# Patient Record
Sex: Male | Born: 1935 | Race: White | Hispanic: No | Marital: Married | Smoking: Former smoker
Health system: Southern US, Community
[De-identification: ages and names within clinical notes are randomized; demographics above are authoritative.]

## PROBLEM LIST (undated history)

## (undated) DIAGNOSIS — I10 Essential (primary) hypertension: Secondary | ICD-10-CM

## (undated) DIAGNOSIS — N138 Other obstructive and reflux uropathy: Secondary | ICD-10-CM

## (undated) DIAGNOSIS — H9190 Unspecified hearing loss, unspecified ear: Secondary | ICD-10-CM

## (undated) DIAGNOSIS — M8580 Other specified disorders of bone density and structure, unspecified site: Secondary | ICD-10-CM

## (undated) DIAGNOSIS — Z87442 Personal history of urinary calculi: Secondary | ICD-10-CM

## (undated) DIAGNOSIS — N401 Enlarged prostate with lower urinary tract symptoms: Secondary | ICD-10-CM

## (undated) DIAGNOSIS — E785 Hyperlipidemia, unspecified: Secondary | ICD-10-CM

## (undated) DIAGNOSIS — K219 Gastro-esophageal reflux disease without esophagitis: Secondary | ICD-10-CM

## (undated) DIAGNOSIS — R7302 Impaired glucose tolerance (oral): Secondary | ICD-10-CM

## (undated) DIAGNOSIS — B0230 Zoster ocular disease, unspecified: Secondary | ICD-10-CM

## (undated) DIAGNOSIS — IMO0001 Reserved for inherently not codable concepts without codable children: Secondary | ICD-10-CM

## (undated) HISTORY — PX: OTHER SURGICAL HISTORY: SHX169

## (undated) HISTORY — PX: INGUINAL HERNIA REPAIR: SUR1180

## (undated) HISTORY — DX: Hyperlipidemia, unspecified: E78.5

## (undated) HISTORY — DX: Other specified disorders of bone density and structure, unspecified site: M85.80

## (undated) HISTORY — DX: Impaired glucose tolerance (oral): R73.02

## (undated) HISTORY — DX: Personal history of urinary calculi: Z87.442

## (undated) HISTORY — DX: Gastro-esophageal reflux disease without esophagitis: K21.9

## (undated) HISTORY — DX: Essential (primary) hypertension: I10

## (undated) HISTORY — PX: CATARACT EXTRACTION: SUR2

## (undated) HISTORY — DX: Other obstructive and reflux uropathy: N13.8

## (undated) HISTORY — DX: Reserved for inherently not codable concepts without codable children: IMO0001

## (undated) HISTORY — DX: Benign prostatic hyperplasia with lower urinary tract symptoms: N40.1

## (undated) HISTORY — DX: Unspecified hearing loss, unspecified ear: H91.90

## (undated) HISTORY — DX: Zoster ocular disease, unspecified: B02.30

---

## 1995-02-23 HISTORY — PX: APPENDECTOMY: SHX54

## 1998-02-22 DIAGNOSIS — N138 Other obstructive and reflux uropathy: Secondary | ICD-10-CM

## 1998-02-22 HISTORY — DX: Benign prostatic hyperplasia with lower urinary tract symptoms: N13.8

## 1998-11-20 ENCOUNTER — Other Ambulatory Visit: Admission: RE | Admit: 1998-11-20 | Discharge: 1998-11-20 | Payer: Self-pay | Admitting: Urology

## 2000-02-23 HISTORY — PX: COLONOSCOPY: SHX174

## 2001-12-19 ENCOUNTER — Ambulatory Visit: Admission: RE | Admit: 2001-12-19 | Discharge: 2001-12-19 | Payer: Self-pay | Admitting: Family Medicine

## 2003-02-23 HISTORY — PX: LITHOTRIPSY: SUR834

## 2003-10-17 ENCOUNTER — Ambulatory Visit (HOSPITAL_COMMUNITY): Admission: RE | Admit: 2003-10-17 | Discharge: 2003-10-17 | Payer: Self-pay | Admitting: Urology

## 2005-08-10 ENCOUNTER — Encounter: Payer: Self-pay | Admitting: Family Medicine

## 2005-08-13 ENCOUNTER — Encounter: Admission: RE | Admit: 2005-08-13 | Discharge: 2005-08-13 | Payer: Self-pay | Admitting: Family Medicine

## 2006-02-13 ENCOUNTER — Emergency Department (HOSPITAL_COMMUNITY): Admission: EM | Admit: 2006-02-13 | Discharge: 2006-02-14 | Payer: Self-pay | Admitting: Emergency Medicine

## 2006-03-02 ENCOUNTER — Ambulatory Visit: Payer: Self-pay | Admitting: Gastroenterology

## 2006-03-14 ENCOUNTER — Encounter: Admission: RE | Admit: 2006-03-14 | Discharge: 2006-03-14 | Payer: Self-pay | Admitting: Family Medicine

## 2007-03-21 ENCOUNTER — Inpatient Hospital Stay (HOSPITAL_COMMUNITY): Admission: EM | Admit: 2007-03-21 | Discharge: 2007-03-22 | Payer: Self-pay | Admitting: Emergency Medicine

## 2007-05-02 ENCOUNTER — Ambulatory Visit: Payer: Self-pay | Admitting: Gastroenterology

## 2007-05-12 ENCOUNTER — Ambulatory Visit (HOSPITAL_COMMUNITY): Admission: RE | Admit: 2007-05-12 | Discharge: 2007-05-12 | Payer: Self-pay | Admitting: Gastroenterology

## 2008-08-13 ENCOUNTER — Encounter: Payer: Self-pay | Admitting: Family Medicine

## 2008-08-13 LAB — CONVERTED CEMR LAB: Vit D, 25-Hydroxy: 39.4 ng/mL

## 2009-10-28 ENCOUNTER — Encounter: Payer: Self-pay | Admitting: Family Medicine

## 2009-10-28 LAB — CONVERTED CEMR LAB
ALT: 15 units/L
Alkaline Phosphatase: 71 units/L
CO2: 23 meq/L
Cholesterol: 130 mg/dL
Creatinine, Ser: 0.95 mg/dL
Glucose, Bld: 104 mg/dL
Hgb A1c MFr Bld: 5.6 %
LDL Cholesterol: 63 mg/dL
Sodium: 144 meq/L
Total Bilirubin: 0.5 mg/dL
Triglycerides: 48 mg/dL

## 2010-03-31 ENCOUNTER — Encounter: Payer: Self-pay | Admitting: Family Medicine

## 2010-03-31 ENCOUNTER — Encounter (INDEPENDENT_AMBULATORY_CARE_PROVIDER_SITE_OTHER): Payer: Medicare Other | Admitting: Family Medicine

## 2010-03-31 DIAGNOSIS — Z8719 Personal history of other diseases of the digestive system: Secondary | ICD-10-CM | POA: Insufficient documentation

## 2010-03-31 DIAGNOSIS — E785 Hyperlipidemia, unspecified: Secondary | ICD-10-CM | POA: Insufficient documentation

## 2010-03-31 DIAGNOSIS — I10 Essential (primary) hypertension: Secondary | ICD-10-CM | POA: Insufficient documentation

## 2010-03-31 DIAGNOSIS — H409 Unspecified glaucoma: Secondary | ICD-10-CM

## 2010-03-31 DIAGNOSIS — Z87442 Personal history of urinary calculi: Secondary | ICD-10-CM

## 2010-03-31 DIAGNOSIS — J309 Allergic rhinitis, unspecified: Secondary | ICD-10-CM | POA: Insufficient documentation

## 2010-04-05 ENCOUNTER — Encounter: Payer: Self-pay | Admitting: Family Medicine

## 2010-04-09 NOTE — Assessment & Plan Note (Signed)
Summary: new pt to est/cle   Vital Signs:  Patient profile:   77 year old male Height:      68 inches Weight:      163.25 pounds BMI:     24.91 Temp:     98.1 degrees F oral Pulse rate:   74 / minute Pulse rhythm:   regular BP sitting:   136 / 80  (left arm) Cuff size:   regular  Vitals Entered By: Selena Batten Dance CMA Duncan Dull) (March 31, 2010 2:08 PM) CC: New patient to establish care   History of Present Illness: CC: new patient, establish  previously saw Dr Raquel James.  1. HTN - well contorlled on current meds. no HA, vision changes, chest pain, tightness, urinary changes, LE swelling.    2. HLD - thinks levels doing well.  compliant with lipitor, niaspan.  3. glaucoma - saw Dr. Harlon Flor this week, s/p laser surgery because pressures were a bit high.  compliant with meds.  h/o kidney stone x 1 in past, had to undergo lithotripsy.  no problems since.  Preventative: last CPE was about 6 mo ago, due in summer no fm h/o prostate cancer, urinating fine, nocturia x1, steady stream no BM changes, last colonoscopy unsure unsure of tetanus/pneumonia shots.  has had flu shot.  -  Date:  12/06/2009    Flu vaccine given  Current Medications (verified): 1)  Ecotrin 325 Mg Tbec (Aspirin) .... One Daily 2)  Lipitor 10 Mg Tabs (Atorvastatin Calcium) .Marland Kitchen.. 1 By Mouth Once Nightly 3)  Diovan 80 Mg Tabs (Valsartan) .Marland Kitchen.. 1 By Mouth Once Daily 4)  Fexofenadine Hcl 180 Mg Tabs (Fexofenadine Hcl) .Marland Kitchen.. 1 By Mouth Once Daily 5)  Niaspan 500 Mg Cr-Tabs (Niacin (Antihyperlipidemic)) .Marland Kitchen.. 1 By Mouth At Bedtime 6)  Cosopt 22.3-6.8 Mg/ml Soln (Dorzolamide Hcl-Timolol Mal) .Marland Kitchen.. 1 Drop in Each Eye Two Times A Day 7)  Xalatan 0.005 % Soln (Latanoprost) .Marland Kitchen.. 1 Drop in Each Eye At Bedtime 8)  Caltrate 600+d 600-400 Mg-Unit Tabs (Calcium Carbonate-Vitamin D) .Marland Kitchen.. 1 By Mouth Once Daily  Allergies (verified): No Known Drug Allergies  Past History:  Past Medical History: HTN HLD glaucoma h/o  nephrolithiasis x 1 allergic rhinitis  uro: Dr. Aldean Ast ([facility]) ophtho: Dr. Harlon Flor  Past Surgical History: IH repair L 1955, R 1970 Lithotripsy 2005 Appendectomy 2001  SBO 2008, 2010   Family History: F: dec CAD/MI M: healthy, dec from old age brother: DM sister: BRCA Uncle: leukemia  No CA, CVA  Social History: quit smoking (remotely), no EtOH, no rec drugs caffeine: minimal Lives with wife Corrie Dandy), no pets Occupation: Engineer, maintenance (IT), working for [employer], retired from [employer] 12th grade education  Review of Systems  The patient denies anorexia, fever, weight loss, weight gain, vision loss, decreased hearing, hoarseness, chest pain, syncope, dyspnea on exertion, peripheral edema, prolonged cough, headaches, hemoptysis, abdominal pain, melena, hematochezia, severe indigestion/heartburn, hematuria, and depression.         no nausea/vomiting, diarrhea  Physical Exam  General:  Well-developed,well-nourished,in no acute distress; alert,appropriate and cooperative throughout examination.  slightly hard of hearing Head:  Normocephalic and atraumatic without obvious abnormalities. No apparent alopecia or balding. Eyes:  No corneal or conjunctival inflammation noted. EOMI. Perrla Ears:  TMs clear bilaterally.  hearing aides in. Nose:  nares clear bilaterally Mouth:  Oral mucosa and oropharynx without lesions or exudates.  Teeth in good repair. Neck:  No deformities, masses, or tenderness noted.  no LAD, no bruits Lungs:  Normal respiratory effort,  chest expands symmetrically. Lungs are clear to auscultation, no crackles or wheezes. Heart:  Normal rate and regular rhythm. S1 and S2 normal without gallop, click, rub or other extra sounds.  faint SEM murmur Abdomen:  Bowel sounds positive,abdomen soft and non-tender without masses, organomegaly or hernias noted. Msk:  No deformity or scoliosis noted of thoracic or lumbar spine.   Pulses:  2+ rad pulses, brisk cap  refill. Extremities:  no pedal edema.  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   Impression & Recommendations:  Problem # 1:  ESSENTIAL HYPERTENSION (ICD-401.9) stable on current med.  neg ROS. His updated medication list for this problem includes:    Diovan 80 Mg Tabs (Valsartan) .Marland Kitchen... 1 by mouth once daily  BP today: 136/80  Problem # 2:  DYSLIPIDEMIA (ICD-272.4) continue med.  review records.  update if due for blood work. His updated medication list for this problem includes:    Lipitor 10 Mg Tabs (Atorvastatin calcium) .Marland Kitchen... 1 by mouth once nightly    Niaspan 500 Mg Cr-tabs (Niacin (antihyperlipidemic)) .Marland Kitchen... 1 by mouth at bedtime  Problem # 3:  GLAUCOMA (ICD-365.9) followed by ophtho.  Problem # 4:  ALLERGIC RHINITIS (ICD-477.9) continue med.  seems controlled. His updated medication list for this problem includes:    Fexofenadine Hcl 180 Mg Tabs (Fexofenadine hcl) .Marland Kitchen... 1 by mouth once daily  Problem # 5:  NEPHROLITHIASIS, HX OF (ICD-V13.01) no sxs, no return since lithotripsy 2005  Problem # 6:  SMALL BOWEL OBSTRUCTION, HX OF (ICD-V12.79) no sxs currently.  Complete Medication List: 1)  Ecotrin 325 Mg Tbec (Aspirin) .... One daily 2)  Lipitor 10 Mg Tabs (Atorvastatin calcium) .Marland Kitchen.. 1 by mouth once nightly 3)  Diovan 80 Mg Tabs (Valsartan) .Marland Kitchen.. 1 by mouth once daily 4)  Fexofenadine Hcl 180 Mg Tabs (Fexofenadine hcl) .Marland Kitchen.. 1 by mouth once daily 5)  Niaspan 500 Mg Cr-tabs (Niacin (antihyperlipidemic)) .Marland Kitchen.. 1 by mouth at bedtime 6)  Cosopt 22.3-6.8 Mg/ml Soln (Dorzolamide hcl-timolol mal) .Marland Kitchen.. 1 drop in each eye two times a day 7)  Xalatan 0.005 % Soln (Latanoprost) .Marland Kitchen.. 1 drop in each eye at bedtime 8)  Caltrate 600+d 600-400 Mg-unit Tabs (Calcium carbonate-vitamin d) .Marland Kitchen.. 1 by mouth once daily  Patient  Instructions: 1)  Return in summer for physical. 2)  Return sooner if needed. 3)  I will check into immunizations and latest blood work. and update you if I want anything done sooner. 4)  Good to see you today, call clinic with questions.   Orders Added: 1)  New Patient Level III [78295]    Current Allergies (reviewed today): No known allergies

## 2010-04-15 NOTE — Miscellaneous (Signed)
Summary: new patient input  Clinical Lists Changes  Observations: Added new observation of PNEUVAXDECLN: Not indicated (04/05/2010 13:45) Added new observation of TDBOOSTDUE: 08/24/2018 (04/05/2010 13:45) Added new observation of DM PROGRESS: N/A (04/05/2010 13:45) Added new observation of DM FSREVIEW: N/A (04/05/2010 13:45) Added new observation of PAST SURG HX: IH repair L 1955, R 1970 Lithotripsy 2005 Appendectomy 1997 clonoscopy WNL 2002 [[facility]] rpt 2012  SBO 2008, 2010  (04/05/2010 13:45) Added new observation of PAST MED HX: HTN HLD allergic rhinitis osteopenia GERD impaired glucose tolerance glaucoma h/o nephrolithiasis x 1      Ca monohydrate, hyperoxalate urine "rough spot" on prostate, followed yearly by Dr. Gatha Mayer 11/10/2009  uro: Dr. Aldean Ast ([facility]) ophtho: Dr. Harlon Flor GI: Dr. Arlyce Dice Derm: Dr. Margo Aye (04/05/2010 13:45) Added new observation of HGBA1C: 5.6 % (10/28/2009 13:45) Added new observation of CALCIUM: 9.4 mg/dL (45/40/9811 91:47) Added new observation of ALBUMIN: 4.1 g/dL (82/95/6213 08:65) Added new observation of PROTEIN, TOT: 6.7 g/dL (78/46/9629 52:84) Added new observation of SGPT (ALT): 15 units/L (10/28/2009 13:45) Added new observation of SGOT (AST): 20 units/L (10/28/2009 13:45) Added new observation of ALK PHOS: 71 units/L (10/28/2009 13:45) Added new observation of BILI TOTAL: 0.5 mg/dL (13/24/4010 27:25) Added new observation of CREATININE: 0.95 mg/dL (36/64/4034 74:25) Added new observation of BUN: 26 mg/dL (95/63/8756 43:32) Added new observation of BG RANDOM: 104 mg/dL (95/18/8416 60:63) Added new observation of CO2 PLSM/SER: 23 meq/L (10/28/2009 13:45) Added new observation of CL SERUM: 107 meq/L (10/28/2009 13:45) Added new observation of K SERUM: 4.7 meq/L (10/28/2009 13:45) Added new observation of NA: 144 meq/L (10/28/2009 13:45) Added new observation of LDL: 63 mg/dL (01/60/1093 23:55) Added new observation of HDL:  57 mg/dL (73/22/0254 27:06) Added new observation of TRIGLYC TOT: 48 mg/dL (23/76/2831 51:76) Added new observation of CHOLESTEROL: 130 mg/dL (16/08/3708 62:69) Added new observation of HEMOCCULT: neg  (09/02/2008 13:58) Added new observation of TD BOOSTER: tetanus toxoid  (08/23/2008 13:47) Added new observation of VIT D 25-OH: 39.4 ng/mL (08/13/2008 13:45) Added new observation of PSA: 1.0 ng/mL (08/13/2008 13:45) Added new observation of PNEUMOVAX: given  (07/23/2005 13:51) Added new observation of COLONOSCOPY:  Results: Normal.   (11/22/2000 13:52)       Allergies: No Known Drug Allergies   Past History:  Past Medical History: HTN HLD allergic rhinitis osteopenia GERD impaired glucose tolerance glaucoma h/o nephrolithiasis x 1      Ca monohydrate, hyperoxalate urine "rough spot" on prostate, followed yearly by Dr. Gatha Mayer 11/10/2009  uro: Dr. Aldean Ast ([facility]) ophtho: Dr. Harlon Flor GI: Dr. Judee Clara: Dr. Margo Aye  Past Surgical History: [facility] repair L 1955, R 1970 Lithotripsy 2005 Appendectomy 1997 clonoscopy WNL 2002 [[facility]] rpt 2012  SBO 2008, 2010    Colonoscopy  Procedure date:  11/22/2000  Findings:       Results: Normal.   -  Date:  09/02/2008    Hemoccult neg  Date:  08/23/2008    TD booster tetanus toxoid  Date:  07/23/2005    Pneumovax given    Prevention & Chronic Care Immunizations   Influenza vaccine: given  (12/06/2009)    Tetanus booster: 08/23/2008: tetanus toxoid   Tetanus booster due: 08/24/2018    Pneumococcal vaccine: given  (07/23/2005)   Pneumococcal vaccine deferral: Not indicated  (04/05/2010)    H. zoster vaccine: Not documented  Colorectal Screening   Hemoccult: neg  (09/02/2008)    Colonoscopy:  Results: Normal.   (11/22/2000)  Other Screening   PSA: 1.0  (  08/13/2008)   Smoking status: Not documented  Lipids   Total Cholesterol: 130  (10/28/2009)   LDL: 63  (10/28/2009)   LDL Direct:  Not documented   HDL: 57  (10/28/2009)   Triglycerides: 48  (10/28/2009)    SGOT (AST): 20  (10/28/2009)   SGPT (ALT): 15  (10/28/2009)   Alkaline phosphatase: 71  (10/28/2009)   Total bilirubin: 0.5  (10/28/2009)  Hypertension   Last Blood Pressure: 136 / 80  (03/31/2010)   Serum creatinine: 0.95  (10/28/2009)   Serum potassium 4.7  (10/28/2009)  Self-Management Support :    Hypertension self-management support: Not documented    Lipid self-management support: Not documented

## 2010-07-07 NOTE — H&P (Signed)
NAMECOBIN, CADAVID                ACCOUNT NO.:  [id number]   MEDICAL RECORD NO.:  [id number]          PATIENT TYPE:  EMS   LOCATION:  ED                           FACILITY:  [facility]   PHYSICIAN:  Hettie Holstein, D.O.    DATE OF BIRTH:  1929-02-06   DATE OF ADMISSION:  03/21/2007  DATE OF DISCHARGE:                              HISTORY & PHYSICAL   PRIMARY CARE PHYSICIAN:  Talmadge Coventry, M.D.   CHIEF COMPLAINT:  Abdominal pain.   HISTORY OF PRESENTING ILLNESS:  Mr. Brodzinski is a very pleasant 77-year-  old male who continues to be quite active and continues to work actively  who began having symptoms yesterday afternoon after washing his Zenaida Niece.  He  did not mention much about his symptoms to his wife; however, she  noticed that he was not eating at dinner.  He reported not feeling  right in the evening.  He was up and down all night and he vomited x1  and had some abdominal discomfort.  He remained uncomfortable.  He then  presented to the emergency department at [facility], where he was  discovered to have probable ileus on a CT scan.  According to his wife,  he had had an evaluation before, back in December of 2007, with a  partial bowel obstruction secondary to thickened segment of small bowel.  In any event, he had scheduled followup with [facility].  He has a  previous history of appendectomy, and it was felt at the time that these  were related to adhesions.  In any event, he did undergo an MRI of his  abdomen back in January of 2008 that revealed multiple benign-appearing  cysts, and no worrisome MR imaging findings were noted.  Radiologist  recommended a followup MR in 6 months to document stability.  In the  emergency department, lesions were noted and similar in comparison to  the December 2007 studies.  In any event, the CT did reveal some distal  esophagitis, small bowel distention without focal transition point  identified, and it was felt this was adynamic ileus versus  mid small  bowel or early mid to distal small bowel obstruction.  There was some  minimal ascites and liver lesions as described above.   Mr. Stemm was asymptomatic after his CT scan.  He threw up the first  round of oral contrast though now is asymptomatic.   PAST MEDICAL HISTORY:  Significant for appendectomy in 200,  laparoscopic, and also bilateral inguinal hernia surgeries.  No heart  disease that he knows of.  He has had a kidney stone with lithotripsy in  the past.   MEDICATIONS:  He takes Diovan 80 mg daily.  Additionally he takes  Niaspan 500 mg ER q.h.s., Allegra 180 mg once daily, Cosopt eye drops  twice daily, vitamin B, C and E supplements.   ALLERGIES:  No known drug allergies.   SOCIAL HISTORY:  His wife can be reached at [phone].  Quit tobacco over  30 years ago.  Drinks no alcohol.  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  Has two children.  He is married for 57 years.   FAMILY HISTORY:  His mother died at age 87 with heart problems; as well  father died at age 82 with heart disease.   REVIEW OF SYSTEMS:  He has been in his usual state of health without  chest pain, shortness of breath, only complaints as stated in the HPI.   PHYSICAL EXAMINATION:  VITAL SIGNS:  Vital signs were stable in the  emergency department with a blood pressure of 136/70, temperature 98.7,  heart rate 102, respirations __________ .  HEENT:  Reveals head to be normocephalic, atraumatic.  The extraocular  muscles are intact.  NECK:  Supple, nontender.  No palpable thyromegaly or mass.  CARDIOVASCULAR:  Normal S1 and S2.  LUNGS:  Clear to auscultation bilaterally with normal effort.  No  dullness to percussion.  ABDOMEN:  Soft, nontender with positive bowel sounds.  No rebound or  guarding.  Minimal distention.  No palpable hepatosplenomegaly.  EXTREMITIES:  Lower extremities revealed no edema, no calf tenderness.  Peripheral pulses are symmetrical and palpable bilaterally.  NEUROLOGIC:   Reveals him to be pleasant, alert, oriented, in no acute  distress.  No focal neurologic findings on examination.   LABORATORY DATA:  His sodium was 139, potassium 4.3, BUN 14, creatinine  0.9, glucose 144, AST and ALT 30 and 20, albumin 3.9, WBC 12.5,  hemoglobin 16.4, and platelet count 244.   ASSESSMENT:  1. Partial small bowel obstruction versus ileus.  2. Distal esophagitis.  3. Hypertension.   PLAN:  At this time, we will administer IV fluids and place Mr. Yepiz  on bowel rest for now.  Follow his clinical course, administer some  Reglan as well as proton pump inhibitor.  He has seen [facility] in the past, and he may need to follow up with them to  address issues of esophagitis.  This can be coordinated in the  outpatient setting.  Otherwise, we will follow him conservatively.      Hettie Holstein, D.O.  Electronically Signed     ESS/MEDQ  D:  03/21/2007  T:  03/21/2007  Job:  [id number]   cc:   Talmadge Coventry, M.D.  Fax: [phone]

## 2010-07-07 NOTE — Assessment & Plan Note (Signed)
[facility]                         GASTROENTEROLOGY OFFICE NOTE   NAME:[name], [name]                       MRN:          [id number]  DATE:05/02/2007                            DOB:          [name]    PROBLEM:  Recurrent small bowel obstruction.   The patient has returned for reevaluation.  Approximately five weeks ago  he was hospitalized for 24 hours for a recurrent small bowel  obstruction.  He had a similar episode a year ago.  A CT scan  demonstrated mild small bowel distention without a focal transition  point.  A small bout of ascites was also seen.  Since his discharge the  patient has felt well and has had no further problems.  He has a prior  history of surgery.  He moves his bowels regularly.  Colonoscopy in 2002  was entirely normal.   MEDICATIONS:  1. Lipitor.  2. Diovan.  3. Fexofenadine.  4. Niaspan.  5. Xalatan eye drops.  6. Cosopt eye drops.  7. Caltrate.   PHYSICAL EXAMINATION:  VITAL SIGNS:  Pulse 80, blood pressure 122/74,  weight 166.  HEENT: EOMI.  PERRLA.  Sclerae are anicteric.  Conjunctivae are pink.  NECK:  Supple without thyromegaly, adenopathy or carotid bruits.  CHEST:  Clear to auscultation and percussion without adventitious  sounds.  CARDIAC:  Regular rhythm; normal S1 S2.  There are no murmurs, gallops  or rubs.  ABDOMEN:  Bowel sounds are normoactive.  Abdomen is soft, nontender and  nondistended.  There are no abdominal masses, tenderness, splenic  enlargement or hepatomegaly.  EXTREMITIES:  Full range of motion.  No cyanosis, clubbing or edema.  RECTAL:  Deferred.   IMPRESSION:  Recurrent small bowel obstruction.  I am quite sure this  was due to adhesions.  He currently has no GI complaints.   RECOMMENDATION:  No further GI workup at this point.     Barbette Hair. Arlyce Dice, MD,FACG  Electronically Signed    RDK/MedQ  DD: 05/02/2007  DT: 05/02/2007  Job #: [id number]   cc:   [name],  M.D.

## 2010-07-10 NOTE — Assessment & Plan Note (Signed)
[facility]                         GASTROENTEROLOGY OFFICE NOTE   NAME:[name], [name]                       MRN:          [id number]  DATE:03/02/2006                            DOB:          12-Dec-1928    PROBLEM:  Small bowel obstruction.   [name] is a pleasant, 77 year old white male, who was recently  discharged from [facility] for a partial small bowel  obstruction.  He was treated medically and released about Christmas  time.  He has had no GI complaints since then.  The obstruction was felt  to be due to an adhesion.  He has had no sequelae.   Medical problems, including hypertension, kidney stones, are all stable.   MEDICATIONS INCLUDE:  Lipitor, Diovan, fexofenadine, Niaspan, Combigan  eye drops and vitamins.   ALLERGIES:  He has no allergies.   PHYSICAL EXAMINATION:  Pulse 70, blood pressure 130/80, weight 162.  HEENT: EOMI. PERRLA. Sclerae are anicteric.  Conjunctivae are pink.  NECK:  Supple without thyromegaly, adenopathy or carotid bruits.  CHEST:  Clear to auscultation and percussion without adventitious  sounds.  CARDIAC:  Regular rhythm; normal S1 S2.  There are no murmurs, gallops  or rubs.  ABDOMEN:  Bowel sounds are normoactive.  Abdomen is soft, non-tender and  non-distended.  There are no abdominal masses, tenderness, splenic  enlargement or hepatomegaly.  EXTREMITIES:  Full range of motion.  No cyanosis, clubbing or edema.  RECTAL:  Deferred.   IMPRESSION:  Partial small bowel obstruction - resolved.   RECOMMENDATIONS:  No further GI followup at this point.  He will return  as needed.     Barbette Hair. Arlyce Dice, MD,FACG  Electronically Signed    RDK/MedQ  DD: 03/02/2006  DT: 03/02/2006  Job #: [id number]   cc:   Lanae Boast, MD

## 2010-07-10 NOTE — Discharge Summary (Signed)
[name], [name]                ACCOUNT NO.:  [id number]   MEDICAL RECORD NO.:  [id number]          PATIENT TYPE:  INP   LOCATION:  1506                         FACILITY:  [facility]   PHYSICIAN:  Patchin Singer, M.D.DATE OF BIRTH:  [birth year]   DATE OF ADMISSION:  03/21/2007  DATE OF DISCHARGE:  03/22/2007                               DISCHARGE SUMMARY   FINAL DISCHARGE DIAGNOSIS:  Small-bowel obstruction, resolved.   CONDITION ON DISCHARGE:  Stable.   MEDICATIONS ON DISCHARGE:  Niaspan 500 mg ER daily, Allegra 180 mg  daily, Diovan 80 mg daily, steroid eye drops b.i.d. as used at home,  multivitamins daily.   CONDITION ON DISCHARGE:  Stable.   HISTORY:  This very pleasant 78 year old man was admitted with partial  small bowel obstruction/ileus.  Please see initial history and physical  examination done by Dr. Hannah Beat.   HOSPITAL PROGRESS:  He was treated conservatively with bowel rest and  intravenous fluids.  He did very well and was soon able to tolerate a  clear fluid diet and eventually a soft diet.  On the day of discharge,  his bowels had opened and there was no abdominal pain, nausea or  vomiting.   PHYSICAL EXAMINATION:  VITAL SIGNS:  His temperature was 98.4, blood  pressure 137/76, pulse 70, saturation 99% on room air.  HEART:  Heart sounds were present and normal.  LUNGS:  Clear.  ABDOMEN:  Soft and nontender with normal bowel sounds.   Investigations showed a sodium of 141, potassium 3.9, bicarbonate 28,  BUN 9, creatinine 1.01, hemoglobin 13.9, white blood cell count 6.3,  platelets 208.   FURTHER DISPOSITION:  He was able to be discharged home, and he will  follow up with his primary care physician within 1 week.      Bohorquez Singer, M.D.  Electronically Signed     NCG/MEDQ  D:  04/12/2007  T:  04/12/2007  Job:  [id number]   cc:   Talmadge Coventry, M.D.  Fax: [phone]

## 2010-07-10 NOTE — Consult Note (Signed)
[name], [name]                ACCOUNT NO.:  [id number]   MEDICAL RECORD NO.:  [id number]          PATIENT TYPE:  EMS   LOCATION:  MAJO                         FACILITY:  [facility]   PHYSICIAN:  Gabrielle Dare. [name], M.D.DATE OF BIRTH:  06-27-1928   DATE OF CONSULTATION:  02/14/2006  DATE OF DISCHARGE:                                 CONSULTATION   CHIEF COMPLAINT:  Nausea and vomiting.   HISTORY OF PRESENT ILLNESS:  I was asked to evaluate this pleasant, 77-  year-old white male by Dr. Preston Fleeting from the [facility] Emergency  Department in regards to partial small bowel obstruction.  He complains  of nausea and vomiting since yesterday evening.  He came to the [facility] Emergency Department for further evaluation.  Workup included white  blood cell count of 12.9.  Plain films of the abdomen were suspicious  for partial small bowel obstruction.  He went on to get a CT scan of the  abdomen and pelvis which shows partial small bowel obstruction with a  thickened segment of small bowel centrally.  Contrast goes through into  the colon.  He has had bowel movements x2 after the CT.  The mild pain  that he had previously has completely resolved and he has no further  nausea.   PAST MEDICAL HISTORY:  Hypercholesterolemia and hypertension.   PAST SURGICAL HISTORY:  Bilateral inguinal hernia, one done many years  ago in the service, one done several years ago by Dr. Rolene Course.  He also  had an appendectomy in around 2000 by Dr. Claud Kelp who is my  partner.   SOCIAL HISTORY:  He does not smoke or drink alcohol.   CURRENT MEDICATIONS:  1. Niaspan.  2. Allegra.  3. Diovan.   PHYSICAL EXAMINATION:  VITAL SIGNS:  Temperature 98.2, blood pressure  118/69, pulse 109, respirations 18.  GENERAL:  He is awake and alert, in no acute distress.  HEENT:  Pupils are equal and reactive.  Sclerae are clear.  Neck is  supple with no masses.  LUNGS:  Clear to auscultation with good respiratory  excursions.  HEART:  Regular.  No murmurs hears and pulses palpable in the left  chest.  ABDOMEN:  Soft and nontender and has no significant distention.  Bowel  sounds are present.  No masses are felt.  EXTREMITIES:  Have no significant edema.   LABORATORY DATA:  White blood cell count 12.9, hemoglobin 16.5,  platelets 228, basic metabolic panel unremarkable with the exception of  glucose of 122.  In addition liver function tests:  AST 29, ALT 28,  alkaline phosphatase 54, bilirubin 0.9.   IMPRESSION:  1. Partial small bowel obstruction secondary to thickened segment of      small bowel.  This is already resolving after CT.  No surgical      intervention is required.  I would recommend clear liquids.      Medical evaluation is      pending.  2. Liver lesions x2, which are small and hard to characterize on the      CT scan.  I would recommend a followup MRI of the abdomen      electively.      Gabrielle Dare [name], M.D.  Electronically Signed     BET/MEDQ  D:  02/14/2006  T:  02/15/2006  Job:  [id number]   cc:   Talmadge Coventry, M.D.

## 2010-08-27 ENCOUNTER — Other Ambulatory Visit: Payer: Self-pay | Admitting: Family Medicine

## 2010-08-27 DIAGNOSIS — Z125 Encounter for screening for malignant neoplasm of prostate: Secondary | ICD-10-CM

## 2010-08-27 DIAGNOSIS — E785 Hyperlipidemia, unspecified: Secondary | ICD-10-CM

## 2010-08-27 DIAGNOSIS — I10 Essential (primary) hypertension: Secondary | ICD-10-CM

## 2010-08-31 ENCOUNTER — Other Ambulatory Visit (INDEPENDENT_AMBULATORY_CARE_PROVIDER_SITE_OTHER): Payer: Medicare Other

## 2010-08-31 DIAGNOSIS — Z125 Encounter for screening for malignant neoplasm of prostate: Secondary | ICD-10-CM

## 2010-08-31 DIAGNOSIS — E785 Hyperlipidemia, unspecified: Secondary | ICD-10-CM

## 2010-08-31 DIAGNOSIS — I10 Essential (primary) hypertension: Secondary | ICD-10-CM

## 2010-08-31 LAB — LIPID PANEL
Cholesterol: 146 mg/dL (ref 0–200)
HDL: 55.1 mg/dL (ref >39.00)
LDL Cholesterol: 75 mg/dL (ref 0–99)
VLDL: 15.6 mg/dL (ref 0.0–40.0)

## 2010-08-31 LAB — BASIC METABOLIC PANEL
BUN: 25 mg/dL — ABNORMAL HIGH (ref 6–23)
Calcium: 9.5 mg/dL (ref 8.4–10.5)
Creatinine, Ser: 1 mg/dL (ref 0.4–1.5)
GFR: 73.8 mL/min (ref >60.00)
Glucose, Bld: 101 mg/dL — ABNORMAL HIGH (ref 70–99)

## 2010-08-31 LAB — PSA: PSA: 1.1 ng/mL (ref 0.10–4.00)

## 2010-09-03 ENCOUNTER — Encounter: Payer: Medicare Other | Admitting: Family Medicine

## 2010-09-09 ENCOUNTER — Encounter: Payer: Self-pay | Admitting: Family Medicine

## 2010-09-10 ENCOUNTER — Encounter: Payer: Self-pay | Admitting: Family Medicine

## 2010-09-10 ENCOUNTER — Ambulatory Visit (INDEPENDENT_AMBULATORY_CARE_PROVIDER_SITE_OTHER): Payer: Medicare Other | Admitting: Family Medicine

## 2010-09-10 VITALS — BP 110/76 | HR 80 | Temp 98.3°F | Wt 156.2 lb

## 2010-09-10 DIAGNOSIS — H409 Unspecified glaucoma: Secondary | ICD-10-CM

## 2010-09-10 DIAGNOSIS — Z0000 Encounter for general adult medical examination without abnormal findings: Secondary | ICD-10-CM | POA: Insufficient documentation

## 2010-09-10 DIAGNOSIS — E785 Hyperlipidemia, unspecified: Secondary | ICD-10-CM

## 2010-09-10 DIAGNOSIS — Z8719 Personal history of other diseases of the digestive system: Secondary | ICD-10-CM

## 2010-09-10 DIAGNOSIS — J309 Allergic rhinitis, unspecified: Secondary | ICD-10-CM

## 2010-09-10 DIAGNOSIS — I10 Essential (primary) hypertension: Secondary | ICD-10-CM

## 2010-09-10 MED ORDER — ZOSTER VACCINE LIVE 19400 UNT/0.65ML ~~LOC~~ SOLR: 0.6500 mL | Freq: Once | SUBCUTANEOUS | Status: DC

## 2010-09-10 NOTE — Assessment & Plan Note (Signed)
To f/u with ophtho in near future.

## 2010-09-10 NOTE — Assessment & Plan Note (Signed)
Good control on diovan. Lab Results  Component Value Date   CREATININE 1.0 08/31/2010

## 2010-09-10 NOTE — Patient Instructions (Addendum)
I will send shingles shot to pharmacy.  Check with insurance to see where they prefer you to get it. Work on low back strengthening exercises. Good to see you today, call us with questions. I have provided you with a copy of your personalized plan for preventive services. Please take the time to review along with your updated medication list.

## 2010-09-10 NOTE — Progress Notes (Signed)
Subjective:    Patient ID: [name], male    DOB: 1928/09/24, 77 y.o.   MRN: [id number]  HPI CC: medicare wellness visit  Presents today for medicare wellness visit.  No question or concerns for me today.    Prostate - h/o "rough spot", to start following with [place].  Saw Dr. Serena Colonel last month, told ok for 1 ye f/uar.  PSA 1.10 this visit.  Had prostate exam with uro last month.  H/o SBO - no abd pain.  + constipation.  Takes stool softener daily.  Hearing - doing well.  Uses aids daily. Vision - doing well, followed by Dr Harlon Flor in [place] in [place].  Due for recheck next week to see him.  Preventative: tetanus 2004. Shingles - never had but interested.  has had zoster in eye. Colonoscopy 2002, normal, rpt 10 years.  No fmhx colon cancer. Prostate - see above.  No fmhx prostate cancer.  Living will - have at home, children know, 2 children are co-HC POA and co-beneficiaries.  Medications and allergies reviewed and updated in chart. Patient Active Problem List  Diagnoses  . DYSLIPIDEMIA  . GLAUCOMA  . ESSENTIAL HYPERTENSION  . ALLERGIC RHINITIS  . SMALL BOWEL OBSTRUCTION, HX OF  . NEPHROLITHIASIS, HX OF   Past Medical History  Diagnosis Date  . HTN (hypertension)   . HLD (hyperlipidemia)   . Allergic rhinitis   . Osteopenia   . GERD (gastroesophageal reflux disease)   . Glaucoma   . History of kidney stones     x 1-Ca monhydrate, hyperoxalate urine  . Impaired glucose tolerance   . Prostate disorder     "rough spot"; followed yearly by Dr. Aldean Ast  . Herpes zoster ophthalmicus     history   Past Surgical History  Procedure Date  . Inguinal hernia repair 4098, 1970    left-1955; right-1970  . Lithotripsy 2005  . Appendectomy 1997  . Colonoscopy 2002    WNL-Kaplan; repeat 2012  . Sbo 2008, 2010   History  Substance Use Topics  . Smoking status: Former Games developer  . Smokeless tobacco: Not on file  . Alcohol Use: No   Family History    Problem Relation Age of Onset  . Coronary artery disease Father   . Healthy Mother   . Diabetes Brother   . Breast cancer Sister   . Leukemia      Uncle   No Known Allergies Current Outpatient Prescriptions on File Prior to Visit  Medication Sig Dispense Refill  . aspirin 325 MG tablet Take 325 mg by mouth daily.        Marland Kitchen atorvastatin (LIPITOR) 10 MG tablet Take 10 mg by mouth daily.        . Calcium Carbonate-Vitamin D (CALTRATE 600+D) 600-400 MG-UNIT per tablet Take 1 tablet by mouth daily.        . dorzolamide-timolol (COSOPT) 22.3-6.8 MG/ML ophthalmic solution Place 1 drop into both eyes 2 (two) times daily.        . fexofenadine (ALLEGRA) 180 MG tablet Take 180 mg by mouth daily.        Marland Kitchen latanoprost (XALATAN) 0.005 % ophthalmic solution Place 1 drop into both eyes at bedtime.        . niacin (NIASPAN) 500 MG CR tablet Take 500 mg by mouth at bedtime.        . valsartan (DIOVAN) 80 MG tablet Take 80 mg by mouth daily.         Review  of Systems  Constitutional: Negative for fever, chills, activity change, appetite change, fatigue and unexpected weight change.  HENT: Negative for hearing loss and neck pain.   Eyes: Negative for visual disturbance.  Respiratory: Negative for cough, chest tightness, shortness of breath and wheezing.   Cardiovascular: Negative for chest pain, palpitations and leg swelling.  Gastrointestinal: Negative for nausea, vomiting, abdominal pain, diarrhea, constipation, blood in stool and abdominal distention.  Genitourinary: Negative for hematuria and difficulty urinating.  Musculoskeletal: Negative for myalgias and arthralgias.  Skin: Negative for rash.  Neurological: Negative for dizziness, seizures, syncope and headaches.  Hematological: Does not bruise/bleed easily.  Psychiatric/Behavioral: Negative for dysphoric mood. The patient is not nervous/anxious.       Objective:   Physical Exam  Nursing note and vitals reviewed. Constitutional: He is  oriented to person, place, and time. He appears well-developed and well-nourished. No distress.  HENT:  Head: Normocephalic and atraumatic.  Right Ear: External ear normal.  Left Ear: External ear normal.  Nose: Nose normal.  Mouth/Throat: Oropharynx is clear and moist.  Eyes: Conjunctivae and EOM are normal. Pupils are equal, round, and reactive to light.  Neck: Normal range of motion. Neck supple. No thyromegaly present.  Cardiovascular: Normal rate, regular rhythm, normal heart sounds and intact distal pulses.   No murmur heard. Pulses:      Radial pulses are 2+ on the right side, and 2+ on the left side.  Pulmonary/Chest: Effort normal and breath sounds normal. No respiratory distress. He has no wheezes. He has no rales.  Abdominal: Soft. Bowel sounds are normal. He exhibits no distension and no mass. There is no tenderness. There is no rebound and no guarding.  Musculoskeletal: Normal range of motion.  Lymphadenopathy:    He has no cervical adenopathy.  Neurological: He is alert and oriented to person, place, and time.       CN grossly intact, station and gait intact  Skin: Skin is warm and dry. No rash noted.  Psychiatric: He has a normal mood and affect. His behavior is normal. Judgment and thought content normal.          Assessment & Plan:

## 2010-09-10 NOTE — Assessment & Plan Note (Signed)
+   constipation but no prolonged pain.  Knows to let me know if pain returning.

## 2010-09-10 NOTE — Assessment & Plan Note (Signed)
Good control on lipitor, tolerating well.

## 2010-09-10 NOTE — Assessment & Plan Note (Signed)
I have personally reviewed the Medicare Annual Wellness questionnaire and have noted 1. The patient's medical and social history 2. Their use of alcohol, tobacco or illicit drugs 3. Their current medications and supplements 4. The patient's functional ability including ADL's, fall risks, home safety risks and hearing or visual             impairment. 5. Diet and physical activities 6. Evidence for depression or mood disorders  The patients weight, height, BMI have been recorded in the chart I have made referrals, counseling and provided education to the patient based review of the above and I have provided the pt with a written personalized care plan for preventive services.  Shingles shot sent to pharmacy. likely no longer needs colonoscopy, consider iFOB starting next year. RTC 1 year or as needed.

## 2010-09-21 ENCOUNTER — Other Ambulatory Visit: Payer: Self-pay | Admitting: *Deleted

## 2010-09-21 MED ORDER — VALSARTAN 80 MG PO TABS: 80.0000 mg | ORAL_TABLET | Freq: Every day | ORAL | Status: DC

## 2010-11-12 LAB — URINALYSIS, ROUTINE W REFLEX MICROSCOPIC
Glucose, UA: NEGATIVE
Protein, ur: NEGATIVE
Specific Gravity, Urine: 1.016
pH: 7.5

## 2010-11-12 LAB — CBC
HCT: 39.7
Hemoglobin: 13.9
MCHC: 35
RBC: 5.07
RDW: 12.9
RDW: 13.2

## 2010-11-12 LAB — HEMOGLOBIN A1C
Hgb A1c MFr Bld: 5.6
Mean Plasma Glucose: 122

## 2010-11-12 LAB — DIFFERENTIAL
Eosinophils Absolute: 0.1
Eosinophils Relative: 1
Lymphs Abs: 1.1
Monocytes Absolute: 0.5
Monocytes Relative: 4

## 2010-11-12 LAB — LIPID PANEL
Cholesterol: 103
Total CHOL/HDL Ratio: 2.5

## 2010-11-12 LAB — BASIC METABOLIC PANEL
GFR calc non Af Amer: >60
Glucose, Bld: 99
Potassium: 3.9
Sodium: 141

## 2010-11-12 LAB — COMPREHENSIVE METABOLIC PANEL
ALT: 20
AST: 30
Alkaline Phosphatase: 56
CO2: 29
Calcium: 9.8
GFR calc Af Amer: >60
Potassium: 4.3
Sodium: 139
Total Protein: 6.9

## 2010-11-12 LAB — TSH: TSH: 1.049

## 2011-01-28 ENCOUNTER — Encounter: Payer: Self-pay | Admitting: Internal Medicine

## 2011-01-28 ENCOUNTER — Telehealth: Payer: Self-pay | Admitting: Internal Medicine

## 2011-01-28 DIAGNOSIS — H9190 Unspecified hearing loss, unspecified ear: Secondary | ICD-10-CM

## 2011-01-28 NOTE — Telephone Encounter (Signed)
Placed order in chart and routed to [place]. 

## 2011-01-28 NOTE — Telephone Encounter (Signed)
Patient's wife called and stated that he has appointment on Friday the 13th for a hearing test with Dr. Kristian Covey at [facility] and would like a referral because it's the only way medicare will pay for this service.  There fax number is [phone]

## 2011-02-01 NOTE — Telephone Encounter (Signed)
Audiology referral form faxed to [facility] office. mk

## 2011-03-09 ENCOUNTER — Other Ambulatory Visit: Payer: Self-pay | Admitting: Internal Medicine

## 2011-03-09 MED ORDER — NIACIN ER (ANTIHYPERLIPIDEMIC) 500 MG PO TBCR: 500.0000 mg | EXTENDED_RELEASE_TABLET | Freq: Every day | ORAL | Status: DC

## 2011-03-09 MED ORDER — ATORVASTATIN CALCIUM 10 MG PO TABS: 10.0000 mg | ORAL_TABLET | Freq: Every day | ORAL | Status: DC

## 2011-03-09 NOTE — Telephone Encounter (Signed)
Rx sent to pharmacy   

## 2011-03-29 DIAGNOSIS — H4011X0 Primary open-angle glaucoma, stage unspecified: Secondary | ICD-10-CM | POA: Diagnosis not present

## 2011-03-29 DIAGNOSIS — H409 Unspecified glaucoma: Secondary | ICD-10-CM | POA: Diagnosis not present

## 2011-04-19 ENCOUNTER — Other Ambulatory Visit: Payer: Self-pay | Admitting: *Deleted

## 2011-04-19 MED ORDER — VALSARTAN 80 MG PO TABS: 80.0000 mg | ORAL_TABLET | Freq: Every day | ORAL | Status: DC

## 2011-08-09 ENCOUNTER — Other Ambulatory Visit: Payer: Self-pay | Admitting: *Deleted

## 2011-08-09 MED ORDER — NIACIN ER (ANTIHYPERLIPIDEMIC) 500 MG PO TBCR: 500.0000 mg | EXTENDED_RELEASE_TABLET | Freq: Every day | ORAL | Status: DC

## 2011-08-20 ENCOUNTER — Other Ambulatory Visit: Payer: Self-pay

## 2011-08-20 DIAGNOSIS — N401 Enlarged prostate with lower urinary tract symptoms: Secondary | ICD-10-CM | POA: Diagnosis not present

## 2011-08-20 DIAGNOSIS — N402 Nodular prostate without lower urinary tract symptoms: Secondary | ICD-10-CM | POA: Diagnosis not present

## 2011-08-20 MED ORDER — ATORVASTATIN CALCIUM 10 MG PO TABS: 10.0000 mg | ORAL_TABLET | Freq: Every day | ORAL | Status: DC

## 2011-08-20 NOTE — Telephone Encounter (Signed)
Ok to refill 

## 2011-09-05 ENCOUNTER — Encounter: Payer: Self-pay | Admitting: Family Medicine

## 2011-09-14 ENCOUNTER — Other Ambulatory Visit: Payer: Self-pay | Admitting: *Deleted

## 2011-09-14 MED ORDER — VALSARTAN 80 MG PO TABS: 80.0000 mg | ORAL_TABLET | Freq: Every day | ORAL | Status: DC

## 2011-09-14 MED ORDER — ATORVASTATIN CALCIUM 10 MG PO TABS: 10.0000 mg | ORAL_TABLET | Freq: Every day | ORAL | Status: DC

## 2011-09-30 DIAGNOSIS — H4011X0 Primary open-angle glaucoma, stage unspecified: Secondary | ICD-10-CM | POA: Diagnosis not present

## 2011-09-30 DIAGNOSIS — H409 Unspecified glaucoma: Secondary | ICD-10-CM | POA: Diagnosis not present

## 2011-11-18 ENCOUNTER — Other Ambulatory Visit: Payer: Self-pay | Admitting: *Deleted

## 2011-11-18 MED ORDER — VALSARTAN 80 MG PO TABS: 80.0000 mg | ORAL_TABLET | Freq: Every day | ORAL | Status: DC

## 2011-11-29 DIAGNOSIS — Z23 Encounter for immunization: Secondary | ICD-10-CM | POA: Diagnosis not present

## 2011-12-06 ENCOUNTER — Other Ambulatory Visit: Payer: Self-pay | Admitting: *Deleted

## 2011-12-06 MED ORDER — NIACIN ER (ANTIHYPERLIPIDEMIC) 500 MG PO TBCR: 500.0000 mg | EXTENDED_RELEASE_TABLET | Freq: Every day | ORAL | Status: DC

## 2011-12-16 ENCOUNTER — Other Ambulatory Visit: Payer: Self-pay | Admitting: *Deleted

## 2011-12-16 MED ORDER — VALSARTAN 80 MG PO TABS: 80.0000 mg | ORAL_TABLET | Freq: Every day | ORAL | Status: DC

## 2011-12-16 NOTE — Telephone Encounter (Signed)
OK to refill? Has not been seen in over 1 year. Was told at last refill he needed OV.

## 2011-12-16 NOTE — Telephone Encounter (Signed)
plz call and schedule appt for medicare wellness visit.  Will refill 1 more mo.

## 2011-12-16 NOTE — Telephone Encounter (Signed)
Message left notifying patient to scheduled MWV prior to further refills. Will await return call.

## 2011-12-29 ENCOUNTER — Other Ambulatory Visit: Payer: Self-pay | Admitting: Family Medicine

## 2011-12-29 DIAGNOSIS — I10 Essential (primary) hypertension: Secondary | ICD-10-CM

## 2011-12-29 DIAGNOSIS — E785 Hyperlipidemia, unspecified: Secondary | ICD-10-CM

## 2011-12-30 ENCOUNTER — Other Ambulatory Visit (INDEPENDENT_AMBULATORY_CARE_PROVIDER_SITE_OTHER): Payer: Medicare Other

## 2011-12-30 DIAGNOSIS — E785 Hyperlipidemia, unspecified: Secondary | ICD-10-CM | POA: Diagnosis not present

## 2011-12-30 DIAGNOSIS — I10 Essential (primary) hypertension: Secondary | ICD-10-CM

## 2011-12-30 LAB — LIPID PANEL
HDL: 49.8 mg/dL (ref >39.00)
Total CHOL/HDL Ratio: 3
VLDL: 11.2 mg/dL (ref 0.0–40.0)

## 2011-12-30 LAB — BASIC METABOLIC PANEL
CO2: 28 mEq/L (ref 19–32)
Calcium: 9.2 mg/dL (ref 8.4–10.5)
GFR: 85.94 mL/min (ref >60.00)
Sodium: 138 mEq/L (ref 135–145)

## 2012-01-04 ENCOUNTER — Encounter: Payer: Self-pay | Admitting: Family Medicine

## 2012-01-04 ENCOUNTER — Ambulatory Visit (INDEPENDENT_AMBULATORY_CARE_PROVIDER_SITE_OTHER): Payer: Medicare Other | Admitting: Family Medicine

## 2012-01-04 VITALS — BP 122/66 | HR 72 | Temp 98.0°F | Ht 67.5 in | Wt 162.2 lb

## 2012-01-04 DIAGNOSIS — Z1211 Encounter for screening for malignant neoplasm of colon: Secondary | ICD-10-CM | POA: Diagnosis not present

## 2012-01-04 DIAGNOSIS — E785 Hyperlipidemia, unspecified: Secondary | ICD-10-CM

## 2012-01-04 DIAGNOSIS — R011 Cardiac murmur, unspecified: Secondary | ICD-10-CM

## 2012-01-04 DIAGNOSIS — I10 Essential (primary) hypertension: Secondary | ICD-10-CM

## 2012-01-04 DIAGNOSIS — Z0000 Encounter for general adult medical examination without abnormal findings: Secondary | ICD-10-CM | POA: Diagnosis not present

## 2012-01-04 DIAGNOSIS — H409 Unspecified glaucoma: Secondary | ICD-10-CM | POA: Diagnosis not present

## 2012-01-04 NOTE — Assessment & Plan Note (Signed)
Sees ophtho regularly.  

## 2012-01-04 NOTE — Progress Notes (Signed)
Subjective:    Patient ID: [name], male    DOB: 1928/09/03, 78 y.o.   MRN: [id number]  HPI CC: medicare wellness visit  No questions or concerns today.  Several week h/o hacking cough, worse in cold.  Dry cough.   Some constipation - taking stool softeners regularly.  Drinks good water.  H/o SBO - no abd pain.   Prostate - h/o "rough spot", sees Dr. Retta Diones every 6 mo.  Told last PSA was "a little high".  Hearing - doing well. Uses aids daily.  Vision - doing well, followed by Dr Harlon Flor in [place] in [place]. Due for recheck next week to see him.   Preventative:  tetanus 2004.  Shingles - 2012 Flu shot 2013 Pneumovax 2007 Colonoscopy 2002, normal, rpt 10 years. No fmhx colon cancer. Prostate - see above. No fmhx prostate cancer.  Checked by urologist.  No falls in last year. Denies depression/anhedonia, sadness.  Enjoys working outside.   Has hearing aides.  Has not seen hearing doctor regularly. Sees eye doctor regularly 2/2 glaucoma.  Has cataracts, monitoring for now.  Living will - have at home, children know, 2 children are co-HC POA and co-beneficiaries.    Wt Readings from Last 3 Encounters:  01/04/12 162 lb 4 oz (73.596 kg)  09/10/10 156 lb 4 oz (70.875 kg)  03/31/10 163 lb 4 oz (74.05 kg)   Medications and allergies reviewed and updated in chart.  Past histories reviewed and updated if relevant as below. Patient Active Problem List  Diagnosis  . DYSLIPIDEMIA  . GLAUCOMA  . ESSENTIAL HYPERTENSION  . ALLERGIC RHINITIS  . SMALL BOWEL OBSTRUCTION, HX OF  . NEPHROLITHIASIS, HX OF  . Medicare annual wellness visit, subsequent   Past Medical History  Diagnosis Date  . HTN (hypertension)   . HLD (hyperlipidemia)   . Allergic rhinitis   . Osteopenia   . GERD (gastroesophageal reflux disease)   . Glaucoma(365)   . History of kidney stones     x 1-Ca monhydrate, hyperoxalate urine s/p lithotripsy  . Impaired glucose tolerance   . BPH (benign  prostatic hypertrophy) with urinary obstruction     40gm, L lobe 4mm prostate nodule; followed yearly by Dr. Aldean Ast, now [facility]  . Herpes zoster ophthalmicus     history  . Impaired hearing     uses hearing aides bilaterally   Past Surgical History  Procedure Date  . Inguinal hernia repair 3086, 1970    left-1955; right-1970  . Lithotripsy 2005  . Appendectomy 1997  . Colonoscopy 2002    WNL-Kaplan; repeat 2012  . Sbo 2008, 2010   History  Substance Use Topics  . Smoking status: Former Games developer  . Smokeless tobacco: Never Used  . Alcohol Use: No   Family History  Problem Relation Age of Onset  . Coronary artery disease Father   . Healthy Mother   . Diabetes Brother   . Breast cancer Sister   . Leukemia      Uncle   No Known Allergies Current Outpatient Prescriptions on File Prior to Visit  Medication Sig Dispense Refill  . aspirin 325 MG tablet Take 325 mg by mouth daily.        Marland Kitchen atorvastatin (LIPITOR) 10 MG tablet Take 1 tablet (10 mg total) by mouth daily.  30 tablet  11  . Calcium Carbonate-Vitamin D (CALTRATE 600+D) 600-400 MG-UNIT per tablet Take 1 tablet by mouth daily.        . dorzolamide-timolol (COSOPT)  22.3-6.8 MG/ML ophthalmic solution Place 1 drop into both eyes 2 (two) times daily.        . fexofenadine (ALLEGRA) 180 MG tablet Take 180 mg by mouth daily.        Marland Kitchen latanoprost (XALATAN) 0.005 % ophthalmic solution Place 1 drop into both eyes at bedtime.        Marland Kitchen loratadine (CLARITIN) 10 MG tablet Take 10 mg by mouth daily.      . Multiple Vitamins-Minerals (MENS MULTI VITAMIN & MINERAL) TABS Take 1 tablet by mouth daily.        . niacin (NIASPAN) 500 MG CR tablet Take 1 tablet (500 mg total) by mouth at bedtime.  30 tablet  0  . valsartan (DIOVAN) 80 MG tablet Take 1 tablet (80 mg total) by mouth daily.  30 tablet  0     Review of Systems  Constitutional: Negative for fever, chills, activity change, appetite change, fatigue and unexpected weight  change.  HENT: Negative for hearing loss and neck pain.   Eyes: Negative for visual disturbance.  Respiratory: Negative for cough, chest tightness, shortness of breath and wheezing.   Cardiovascular: Negative for chest pain, palpitations and leg swelling.  Gastrointestinal: Negative for nausea, vomiting, abdominal pain, diarrhea, constipation, blood in stool and abdominal distention.  Genitourinary: Negative for hematuria and difficulty urinating.  Musculoskeletal: Negative for myalgias and arthralgias.  Skin: Negative for rash.  Neurological: Negative for dizziness, seizures, syncope and headaches.  Hematological: Does not bruise/bleed easily.  Psychiatric/Behavioral: Negative for dysphoric mood. The patient is not nervous/anxious.        Objective:   Physical Exam  Nursing note and vitals reviewed. Constitutional: He is oriented to person, place, and time. He appears well-developed and well-nourished. No distress.       Pleasant elderly gentleman  HENT:  Head: Normocephalic and atraumatic.  Right Ear: External ear normal.  Left Ear: External ear normal.  Nose: Nose normal.  Mouth/Throat: Oropharynx is clear and moist. No oropharyngeal exudate.       Hearing aides in place  Eyes: Conjunctivae normal and EOM are normal. Pupils are equal, round, and reactive to light. No scleral icterus.  Neck: Normal range of motion. Neck supple. Carotid bruit is not present.  Cardiovascular: Normal rate, regular rhythm and intact distal pulses.   Murmur heard.  Systolic murmur is present with a grade of 3/6   Diastolic murmur is present with a grade of 1/6  Pulses:      Radial pulses are 2+ on the right side, and 2+ on the left side.       Split S2  Pulmonary/Chest: Effort normal and breath sounds normal. No respiratory distress. He has no wheezes. He has no rales.  Abdominal: Soft. Bowel sounds are normal. He exhibits no distension and no mass. There is no tenderness. There is no rebound and no  guarding.  Musculoskeletal: Normal range of motion. He exhibits no edema.  Lymphadenopathy:    He has no cervical adenopathy.  Neurological: He is alert and oriented to person, place, and time.       CN grossly intact, station and gait intact  Skin: Skin is warm and dry. No rash noted.  Psychiatric: He has a normal mood and affect. His behavior is normal. Judgment and thought content normal.       Assessment & Plan:

## 2012-01-04 NOTE — Assessment & Plan Note (Signed)
Chronic, stable. Continue meds. 

## 2012-01-04 NOTE — Assessment & Plan Note (Signed)
Great control on current regimen. Continue med

## 2012-01-04 NOTE — Patient Instructions (Addendum)
Take allegra or claritin, not both. Stool kit today. Good to see you, call us with quesitons. Bring copy of living will to next appointment for [place] to have in chart.

## 2012-01-04 NOTE — Assessment & Plan Note (Signed)
I have personally reviewed the Medicare Annual Wellness questionnaire and have noted 1. The patient's medical and social history 2. Their use of alcohol, tobacco or illicit drugs 3. Their current medications and supplements 4. The patient's functional ability including ADL's, fall risks, home safety risks and hearing or visual impairment. 5. Diet and physical activity 6. Evidence for depression or mood disorders The patients weight, height, BMI have been recorded in the chart.  Hearing and vision has been addressed. I have made referrals, counseling and provided education to the patient based review of the above and I have provided the pt with a written personalized care plan for preventive services. See scanned questionairre. Advanced directives discussed: asked them to bring copy of living will.  Reviewed preventative protocols and updated unless pt declined. UTD immunizations.

## 2012-01-04 NOTE — Assessment & Plan Note (Signed)
Faintly heard in past, more pronounced today.  Asxs.  Continue to monitor for now.  If continued progression, obtain echo.

## 2012-01-05 ENCOUNTER — Other Ambulatory Visit: Payer: Self-pay | Admitting: *Deleted

## 2012-01-05 MED ORDER — NIACIN ER (ANTIHYPERLIPIDEMIC) 500 MG PO TBCR: 500.0000 mg | EXTENDED_RELEASE_TABLET | Freq: Every day | ORAL | Status: DC

## 2012-01-11 ENCOUNTER — Other Ambulatory Visit: Payer: Medicare Other

## 2012-01-11 LAB — FECAL OCCULT BLOOD, IMMUNOCHEMICAL: Fecal Occult Bld: POSITIVE

## 2012-01-13 ENCOUNTER — Other Ambulatory Visit: Payer: Self-pay | Admitting: Family Medicine

## 2012-01-13 DIAGNOSIS — R195 Other fecal abnormalities: Secondary | ICD-10-CM

## 2012-01-19 ENCOUNTER — Encounter: Payer: Self-pay | Admitting: Gastroenterology

## 2012-02-01 DIAGNOSIS — N402 Nodular prostate without lower urinary tract symptoms: Secondary | ICD-10-CM | POA: Diagnosis not present

## 2012-02-09 ENCOUNTER — Encounter: Payer: Self-pay | Admitting: Gastroenterology

## 2012-02-09 ENCOUNTER — Other Ambulatory Visit (INDEPENDENT_AMBULATORY_CARE_PROVIDER_SITE_OTHER): Payer: Medicare Other

## 2012-02-09 ENCOUNTER — Ambulatory Visit (INDEPENDENT_AMBULATORY_CARE_PROVIDER_SITE_OTHER): Payer: Medicare Other | Admitting: Gastroenterology

## 2012-02-09 VITALS — BP 126/74 | HR 76 | Ht 67.25 in | Wt 165.2 lb

## 2012-02-09 DIAGNOSIS — R195 Other fecal abnormalities: Secondary | ICD-10-CM | POA: Diagnosis not present

## 2012-02-09 LAB — CBC WITH DIFFERENTIAL/PLATELET
Basophils Relative: 0.5 % (ref 0.0–3.0)
Eosinophils Relative: 6.7 % — ABNORMAL HIGH (ref 0.0–5.0)
Lymphocytes Relative: 32.9 % (ref 12.0–46.0)
Monocytes Relative: 12.5 % — ABNORMAL HIGH (ref 3.0–12.0)
Neutrophils Relative %: 47.4 % (ref 43.0–77.0)
RBC: 4.65 Mil/uL (ref 4.22–5.81)
WBC: 6.8 10*3/uL (ref 4.5–10.5)

## 2012-02-09 MED ORDER — NA SULFATE-K SULFATE-MG SULF 17.5-3.13-1.6 GM/177ML PO SOLN: ORAL | Status: DC

## 2012-02-09 NOTE — Progress Notes (Signed)
History of Present Illness: Pleasant 78 year old white male referred for evaluation of Hemoccult-positive stool. This was noted on routine testing. [name] has no GI complaints including change of bowel habits, abdominal pain, melena or hematochezia. He's on no gastric irritants including nonsteroidals. Last colonoscopy was in 2002.    Past Medical History  Diagnosis Date  . HTN (hypertension)   . HLD (hyperlipidemia)   . Allergic rhinitis   . Osteopenia   . GERD (gastroesophageal reflux disease)   . Glaucoma(365)   . History of kidney stones     x 1-Ca monhydrate, hyperoxalate urine s/p lithotripsy  . Impaired glucose tolerance   . BPH (benign prostatic hypertrophy) with urinary obstruction     40gm, L lobe 4mm prostate nodule; followed yearly by Dr. Aldean Ast, now [facility]  . Herpes zoster ophthalmicus     history  . Impaired hearing     uses hearing aides bilaterally   Past Surgical History  Procedure Date  . Inguinal hernia repair 8469, 1970    left-1955; right-1970  . Lithotripsy 2005  . Appendectomy 1997  . Colonoscopy 2002    WNL-Anica Alcaraz; repeat 2012  . Sbo 2008, 2010   family history includes Breast cancer in his sister; Coronary artery disease in his father; Diabetes in his brother; Healthy in his mother; Leukemia in his maternal uncle; and Pancreatic cancer in his sister. Current Outpatient Prescriptions  Medication Sig Dispense Refill  . aspirin 325 MG tablet Take 325 mg by mouth daily.        Marland Kitchen atorvastatin (LIPITOR) 10 MG tablet Take 1 tablet (10 mg total) by mouth daily.  30 tablet  11  . Calcium Carbonate-Vitamin D (CALTRATE 600+D) 600-400 MG-UNIT per tablet Take 1 tablet by mouth daily.        . dorzolamide-timolol (COSOPT) 22.3-6.8 MG/ML ophthalmic solution Place 1 drop into both eyes 2 (two) times daily.        . fexofenadine (ALLEGRA) 180 MG tablet Take 180 mg by mouth daily.        Marland Kitchen latanoprost (XALATAN) 0.005 % ophthalmic solution Place 1 drop into  both eyes at bedtime.        . Multiple Vitamins-Minerals (MENS MULTI VITAMIN & MINERAL) TABS Take 1 tablet by mouth daily.        . niacin (NIASPAN) 500 MG CR tablet Take 1 tablet (500 mg total) by mouth at bedtime.  30 tablet  5  . Polyvinyl Alcohol-Povidone (REFRESH OP) Apply to eye 2 (two) times daily.      . valsartan (DIOVAN) 80 MG tablet Take 1 tablet (80 mg total) by mouth daily.  30 tablet  0   Allergies as of 02/09/2012  . (No Known Allergies)    reports that he has quit smoking. His smoking use included Cigarettes. He has never used smokeless tobacco. He reports that he does not drink alcohol or use illicit drugs.     Review of Systems: Pertinent positive and negative review of systems were noted in the above HPI section. All other review of systems were otherwise negative.  Vital signs were reviewed in today's medical record Physical Exam: General: Well developed , well nourished, no acute distress Head: Normocephalic and atraumatic Eyes:  sclerae anicteric, EOMI Ears: Normal auditory acuity Mouth: No deformity or lesions Neck: Supple, no masses or thyromegaly Lungs: Clear throughout to auscultation Heart: Regular rate and rhythm; no rubs or bruits; there is a 1/6 early systolic heard best at the left sternal border Abdomen: Soft,  non tender and non distended. No masses, hepatosplenomegaly or hernias noted. Normal Bowel sounds Rectal:deferred Musculoskeletal: Symmetrical with no gross deformities  Skin: No lesions on visible extremities Pulses:  Normal pulses noted Extremities: No clubbing, cyanosis, edema or deformities noted Neurological: Alert oriented x 4, grossly nonfocal Cervical Nodes:  No significant cervical adenopathy Inguinal Nodes: No significant inguinal adenopathy Psychological:  Alert and cooperative. Normal mood and affect

## 2012-02-09 NOTE — Assessment & Plan Note (Signed)
Asymptomatic Hemoccult-positive stool. Plan colonoscopy to rule out bleeding sources including polyps, AVMs or neoplasm. Should colonoscopy be negative I will obtain followup Hemoccults and, if positive, proceed with upper endoscopy. We'll also check CBC.

## 2012-02-09 NOTE — Patient Instructions (Signed)
You have been scheduled for a colonoscopy with propofol. Please follow written instructions given to you at your visit today.  Please pick up your prep kit at the pharmacy within the next 1-3 days. If you use inhalers (even only as needed) or a CPAP machine, please bring them with you on the day of your procedure.  Your physician has requested that you go to the basement for the following lab work before leaving today: CBC

## 2012-02-11 DIAGNOSIS — N402 Nodular prostate without lower urinary tract symptoms: Secondary | ICD-10-CM | POA: Diagnosis not present

## 2012-02-15 ENCOUNTER — Encounter: Payer: Self-pay | Admitting: Family Medicine

## 2012-02-17 ENCOUNTER — Other Ambulatory Visit: Payer: Self-pay | Admitting: *Deleted

## 2012-02-17 MED ORDER — VALSARTAN 80 MG PO TABS: 80.0000 mg | ORAL_TABLET | Freq: Every day | ORAL | Status: DC

## 2012-02-23 HISTORY — PX: COLONOSCOPY: SHX174

## 2012-03-20 ENCOUNTER — Other Ambulatory Visit: Payer: Self-pay | Admitting: Gastroenterology

## 2012-03-20 ENCOUNTER — Ambulatory Visit (AMBULATORY_SURGERY_CENTER): Payer: Medicare Other | Admitting: Gastroenterology

## 2012-03-20 ENCOUNTER — Encounter: Payer: Self-pay | Admitting: Gastroenterology

## 2012-03-20 VITALS — BP 127/82 | HR 78 | Temp 98.3°F | Resp 23 | Ht 67.25 in | Wt 165.0 lb

## 2012-03-20 DIAGNOSIS — I10 Essential (primary) hypertension: Secondary | ICD-10-CM | POA: Diagnosis not present

## 2012-03-20 DIAGNOSIS — K921 Melena: Secondary | ICD-10-CM

## 2012-03-20 DIAGNOSIS — D126 Benign neoplasm of colon, unspecified: Secondary | ICD-10-CM

## 2012-03-20 DIAGNOSIS — E785 Hyperlipidemia, unspecified: Secondary | ICD-10-CM | POA: Diagnosis not present

## 2012-03-20 DIAGNOSIS — H409 Unspecified glaucoma: Secondary | ICD-10-CM | POA: Diagnosis not present

## 2012-03-20 DIAGNOSIS — R195 Other fecal abnormalities: Secondary | ICD-10-CM

## 2012-03-20 DIAGNOSIS — K219 Gastro-esophageal reflux disease without esophagitis: Secondary | ICD-10-CM | POA: Diagnosis not present

## 2012-03-20 MED ORDER — SODIUM CHLORIDE 0.9 % IV SOLN: 500.0000 mL | INTRAVENOUS | Status: DC

## 2012-03-20 NOTE — Progress Notes (Signed)
Patient did not experience any of the following events: a burn prior to discharge; a fall within the facility; wrong site/side/patient/procedure/implant event; or a hospital transfer or hospital admission upon discharge from the facility. (G8907) Patient did not have preoperative order for IV antibiotic SSI prophylaxis. (G8918)  

## 2012-03-20 NOTE — Progress Notes (Signed)
Hemoccults given and wife instructed as to use.  Understanding voiced

## 2012-03-20 NOTE — Progress Notes (Signed)
Called to room to assist during endoscopic procedure.  Patient ID and intended procedure confirmed with present staff. Received instructions for my participation in the procedure from the performing physician.  

## 2012-03-20 NOTE — Op Note (Signed)
[facility] [address] [place], [address]   COLONOSCOPY PROCEDURE REPORT  PATIENT: [name], [name]  MR#: [id number] BIRTHDATE: 05-31-1928 , 83  yrs. old GENDER: Male ENDOSCOPIST: Louis Meckel, MD REFERRED BY: PROCEDURE DATE:  03/20/2012 PROCEDURE:   Colonoscopy with snare polypectomy ASA CLASS:   Class II INDICATIONS:heme-positive stool. MEDICATIONS: MAC sedation, administered by CRNA and propofol (Diprivan) 350mg  IV  DESCRIPTION OF PROCEDURE:   After the risks benefits and alternatives of the procedure were thoroughly explained, informed consent was obtained.  A digital rectal exam revealed no abnormalities of the rectum.   The LB [id number]  endoscope was introduced through the anus and advanced to the cecum, which was identified by both the appendix and ileocecal valve. No adverse events experienced.   The quality of the prep was Suprep excellent The instrument was then slowly withdrawn as the colon was fully examined.      COLON FINDINGS: A flat polyp measuring 3 mm in size was found in the descending colon.  A polypectomy was performed with a cold snare. The resection was complete and the polyp tissue was completely retrieved.   The colon mucosa was otherwise normal.  Retroflexed views revealed no abnormalities. The time to cecum=3 minutes 55 seconds.  Withdrawal time=12 minutes 0 seconds.  The scope was withdrawn and the procedure completed. COMPLICATIONS: There were no complications.  ENDOSCOPIC IMPRESSION: 1.   Flat polyp measuring 3 mm in size was found in the descending colon; polypectomy was performed with a cold snare 2.   The colon mucosa was otherwise normal  RECOMMENDATIONS: followup Hemoccults in one week   eSigned:  Louis Meckel, MD 03/20/2012 4:10 PM   cc:

## 2012-03-20 NOTE — Patient Instructions (Addendum)

## 2012-03-21 ENCOUNTER — Telehealth: Payer: Self-pay | Admitting: *Deleted

## 2012-03-21 NOTE — Telephone Encounter (Signed)
  Follow up Call-  Call back number 03/20/2012  Post procedure Call Back phone  # [phone]  Permission to leave phone message Yes     Patient questions:  Do you have a fever, pain , or abdominal swelling? no Pain Score  0 *  Have you tolerated food without any problems? yes  Have you been able to return to your normal activities? yes  Do you have any questions about your discharge instructions: Diet   no Medications  no Follow up visit  no  Do you have questions or concerns about your Care? no  Actions: * If pain score is 4 or above: No action needed, pain <4.

## 2012-03-28 ENCOUNTER — Encounter: Payer: Self-pay | Admitting: Family Medicine

## 2012-04-02 ENCOUNTER — Encounter: Payer: Self-pay | Admitting: Gastroenterology

## 2012-04-04 ENCOUNTER — Other Ambulatory Visit (INDEPENDENT_AMBULATORY_CARE_PROVIDER_SITE_OTHER): Payer: Medicare Other

## 2012-04-04 DIAGNOSIS — K921 Melena: Secondary | ICD-10-CM

## 2012-04-04 LAB — HEMOCCULT SLIDES (X 3 CARDS)
Fecal Occult Blood: NEGATIVE
OCCULT 1: NEGATIVE
OCCULT 5: NEGATIVE

## 2012-04-05 NOTE — Progress Notes (Signed)
Quick Note:  Please inform the patient that hemeoccults were negative and no further workup is required. ______

## 2012-04-12 DIAGNOSIS — H409 Unspecified glaucoma: Secondary | ICD-10-CM | POA: Diagnosis not present

## 2012-04-12 DIAGNOSIS — H4011X0 Primary open-angle glaucoma, stage unspecified: Secondary | ICD-10-CM | POA: Diagnosis not present

## 2012-05-04 DIAGNOSIS — H409 Unspecified glaucoma: Secondary | ICD-10-CM | POA: Diagnosis not present

## 2012-05-04 DIAGNOSIS — H4011X0 Primary open-angle glaucoma, stage unspecified: Secondary | ICD-10-CM | POA: Diagnosis not present

## 2012-05-18 DIAGNOSIS — H4011X0 Primary open-angle glaucoma, stage unspecified: Secondary | ICD-10-CM | POA: Diagnosis not present

## 2012-05-18 DIAGNOSIS — H2510 Age-related nuclear cataract, unspecified eye: Secondary | ICD-10-CM | POA: Diagnosis not present

## 2012-06-16 ENCOUNTER — Other Ambulatory Visit: Payer: Self-pay | Admitting: *Deleted

## 2012-06-16 MED ORDER — VALSARTAN 80 MG PO TABS: 80.0000 mg | ORAL_TABLET | Freq: Every day | ORAL | Status: DC

## 2012-06-20 ENCOUNTER — Other Ambulatory Visit: Payer: Self-pay | Admitting: *Deleted

## 2012-06-20 MED ORDER — ATORVASTATIN CALCIUM 10 MG PO TABS: 10.0000 mg | ORAL_TABLET | Freq: Every day | ORAL | Status: DC

## 2012-06-20 NOTE — Addendum Note (Signed)
Addended by: Liane Comber C on: 06/20/2012 10:50 AM   Modules accepted: Orders

## 2012-07-14 ENCOUNTER — Other Ambulatory Visit: Payer: Self-pay | Admitting: *Deleted

## 2012-07-14 MED ORDER — NIACIN ER (ANTIHYPERLIPIDEMIC) 500 MG PO TBCR: 500.0000 mg | EXTENDED_RELEASE_TABLET | Freq: Every day | ORAL | Status: DC

## 2012-10-17 DIAGNOSIS — L708 Other acne: Secondary | ICD-10-CM | POA: Diagnosis not present

## 2012-10-17 DIAGNOSIS — L820 Inflamed seborrheic keratosis: Secondary | ICD-10-CM | POA: Diagnosis not present

## 2012-10-17 DIAGNOSIS — D0460 Carcinoma in situ of skin of unspecified upper limb, including shoulder: Secondary | ICD-10-CM | POA: Diagnosis not present

## 2012-10-17 DIAGNOSIS — L570 Actinic keratosis: Secondary | ICD-10-CM | POA: Diagnosis not present

## 2012-11-02 ENCOUNTER — Ambulatory Visit (INDEPENDENT_AMBULATORY_CARE_PROVIDER_SITE_OTHER): Payer: Medicare Other | Admitting: *Deleted

## 2012-11-02 DIAGNOSIS — Z23 Encounter for immunization: Secondary | ICD-10-CM

## 2012-12-15 ENCOUNTER — Other Ambulatory Visit: Payer: Self-pay | Admitting: *Deleted

## 2012-12-15 MED ORDER — VALSARTAN 80 MG PO TABS: 80.0000 mg | ORAL_TABLET | Freq: Every day | ORAL | Status: DC

## 2012-12-29 ENCOUNTER — Other Ambulatory Visit: Payer: Self-pay | Admitting: *Deleted

## 2012-12-29 MED ORDER — ATORVASTATIN CALCIUM 10 MG PO TABS: 10.0000 mg | ORAL_TABLET | Freq: Every day | ORAL | Status: DC

## 2012-12-31 ENCOUNTER — Other Ambulatory Visit: Payer: Self-pay | Admitting: Family Medicine

## 2012-12-31 DIAGNOSIS — R011 Cardiac murmur, unspecified: Secondary | ICD-10-CM

## 2012-12-31 DIAGNOSIS — E785 Hyperlipidemia, unspecified: Secondary | ICD-10-CM

## 2012-12-31 DIAGNOSIS — I10 Essential (primary) hypertension: Secondary | ICD-10-CM

## 2012-12-31 DIAGNOSIS — D72821 Monocytosis (symptomatic): Secondary | ICD-10-CM

## 2013-01-01 ENCOUNTER — Other Ambulatory Visit (INDEPENDENT_AMBULATORY_CARE_PROVIDER_SITE_OTHER): Payer: Medicare Other

## 2013-01-01 DIAGNOSIS — R011 Cardiac murmur, unspecified: Secondary | ICD-10-CM | POA: Diagnosis not present

## 2013-01-01 DIAGNOSIS — E785 Hyperlipidemia, unspecified: Secondary | ICD-10-CM

## 2013-01-01 DIAGNOSIS — D72821 Monocytosis (symptomatic): Secondary | ICD-10-CM

## 2013-01-01 DIAGNOSIS — Z0000 Encounter for general adult medical examination without abnormal findings: Secondary | ICD-10-CM

## 2013-01-01 DIAGNOSIS — I10 Essential (primary) hypertension: Secondary | ICD-10-CM

## 2013-01-01 LAB — COMPREHENSIVE METABOLIC PANEL
ALT: 22 U/L (ref 0–53)
Albumin: 4.1 g/dL (ref 3.5–5.2)
Alkaline Phosphatase: 52 U/L (ref 39–117)
BUN: 15 mg/dL (ref 6–23)
GFR: 95.45 mL/min (ref >60.00)
Glucose, Bld: 100 mg/dL — ABNORMAL HIGH (ref 70–99)
Potassium: 4.5 mEq/L (ref 3.5–5.1)
Total Protein: 7.1 g/dL (ref 6.0–8.3)

## 2013-01-01 LAB — LIPID PANEL
Cholesterol: 134 mg/dL (ref 0–200)
LDL Cholesterol: 70 mg/dL (ref 0–99)
Triglycerides: 56 mg/dL (ref 0.0–149.0)

## 2013-01-01 LAB — CBC WITH DIFFERENTIAL/PLATELET
Basophils Absolute: 0 10*3/uL (ref 0.0–0.1)
Eosinophils Absolute: 0.3 10*3/uL (ref 0.0–0.7)
HCT: 45.3 % (ref 39.0–52.0)
Hemoglobin: 15.4 g/dL (ref 13.0–17.0)
Lymphocytes Relative: 35.1 % (ref 12.0–46.0)
Lymphs Abs: 1.8 10*3/uL (ref 0.7–4.0)
MCHC: 34 g/dL (ref 30.0–36.0)
MCV: 93.4 fl (ref 78.0–100.0)
Monocytes Absolute: 0.6 10*3/uL (ref 0.1–1.0)
Neutro Abs: 2.3 10*3/uL (ref 1.4–7.7)
RDW: 13.3 % (ref 11.5–14.6)
WBC: 5.1 10*3/uL (ref 4.5–10.5)

## 2013-01-04 ENCOUNTER — Ambulatory Visit (INDEPENDENT_AMBULATORY_CARE_PROVIDER_SITE_OTHER): Payer: Medicare Other | Admitting: Family Medicine

## 2013-01-04 ENCOUNTER — Encounter: Payer: Self-pay | Admitting: Family Medicine

## 2013-01-04 VITALS — BP 116/78 | HR 60 | Temp 97.7°F | Ht 67.5 in | Wt 166.5 lb

## 2013-01-04 DIAGNOSIS — E785 Hyperlipidemia, unspecified: Secondary | ICD-10-CM | POA: Diagnosis not present

## 2013-01-04 DIAGNOSIS — Z0000 Encounter for general adult medical examination without abnormal findings: Secondary | ICD-10-CM | POA: Diagnosis not present

## 2013-01-04 DIAGNOSIS — I10 Essential (primary) hypertension: Secondary | ICD-10-CM | POA: Diagnosis not present

## 2013-01-04 NOTE — Assessment & Plan Note (Signed)
Chronic, stable on current regimen. Consider decreased valsartan dose next visit if stays well controlled. BP Readings from Last 3 Encounters:  01/04/13 116/78  03/20/12 127/82  02/09/12 126/74

## 2013-01-04 NOTE — Patient Instructions (Addendum)
Let's add vitamin D 1000 units daily. Try to get at least 1 glass of milk and 1 glass of calcium fortified orange juice daily.  Also eat some dairy daily.  If unable to do this regularly, may need to restart one calcium pill daily. Good to see you today!  You are doing well. Return in 1 year or as needed.

## 2013-01-04 NOTE — Assessment & Plan Note (Signed)
I have personally reviewed the Medicare Annual Wellness questionnaire and have noted 1. The patient's medical and social history 2. Their use of alcohol, tobacco or illicit drugs 3. Their current medications and supplements 4. The patient's functional ability including ADL's, fall risks, home safety risks and hearing or visual impairment. 5. Diet and physical activity 6. Evidence for depression or mood disorders The patients weight, height, BMI have been recorded in the chart.  Hearing and vision has been addressed. I have made referrals, counseling and provided education to the patient based review of the above and I have provided the pt with a written personalized care plan for preventive services. See scanned questionairre. Advanced directives as per chart.  Reviewed preventative protocols and updated unless pt declined.  UTD preventative healthcare. Discussed coming in for Tetanus shot if injury/laceration

## 2013-01-04 NOTE — Progress Notes (Signed)
Subjective:    Patient ID: [name], male    DOB: [birth year], 79 y.o.   MRN: [id number]  HPI CC: medicare wellness visit  Prostate - h/o "rough spot", sees Dr. Retta Diones every 6 mo. To see 02/27/2012.  Told last PSA was "a little high".  Hearing - doing well. Uses aids daily.  Vision - doing well, followed by Dr Harlon Flor in [place] in [place]. Due for recheck next week to see him.  Sees Q6 mo regularly with h/o glaucoma. Monitoring cataracts. No falls in last year.  Denies depression/anhedonia, sadness. Enjoys working outside.   Preventative:  Colonoscopy 02/2002, tubular adenoma. (Dr Arlyce Dice).  No fmhx colon cancer.  Prostate - see above. No fmhx prostate cancer. Checked by urologist.  Clide Dales 2004.  Shingles - 2012.  Flu shot 10/2012 Pneumovax 2007.  Living will - have at home, children know, 2 children are co-HC POA and co-beneficiaries.  Medications and allergies reviewed and updated in chart.  Past histories reviewed and updated if relevant as below. Patient Active Problem List   Diagnosis Date Noted  . Monocytosis 12/31/2012  . Medicare annual wellness visit, subsequent 01/04/2012  . Cardiac murmur 01/04/2012  . DYSLIPIDEMIA 03/31/2010  . GLAUCOMA 03/31/2010  . ESSENTIAL HYPERTENSION 03/31/2010  . ALLERGIC RHINITIS 03/31/2010  . SMALL BOWEL OBSTRUCTION, HX OF 03/31/2010  . NEPHROLITHIASIS, HX OF 03/31/2010   Past Medical History  Diagnosis Date  . HTN (hypertension)   . HLD (hyperlipidemia)   . Allergic rhinitis   . Osteopenia   . GERD (gastroesophageal reflux disease)   . Glaucoma   . History of kidney stones     x 1-Ca monhydrate, hyperoxalate urine s/p lithotripsy  . Impaired glucose tolerance   . BPH (benign prostatic hypertrophy) with urinary obstruction     40gm, L lobe 4mm prostate nodule; followed yearly by Dr. Aldean Ast, now [place]  . Herpes zoster ophthalmicus     history  . Impaired hearing     uses hearing aides bilaterally   Past Surgical  History  Procedure Laterality Date  . Inguinal hernia repair  4098, 1970    left-1955; right-1970  . Lithotripsy  2005  . Appendectomy  1997  . Colonoscopy  2002    [facility]; repeat 2012  . Sbo  2008, 2010  . Colonoscopy  02/2012    tubular adenoma Arlyce Dice)   History  Substance Use Topics  . Smoking status: Former Smoker    Types: Cigarettes  . Smokeless tobacco: Never Used  . Alcohol Use: No   Family History  Problem Relation Age of Onset  . Coronary artery disease Father   . Healthy Mother   . Diabetes Brother   . Breast cancer Sister   . Leukemia Maternal Uncle   . Pancreatic cancer Sister    No Known Allergies Current Outpatient Prescriptions on File Prior to Visit  Medication Sig Dispense Refill  . aspirin 325 MG tablet Take 325 mg by mouth daily.        Marland Kitchen atorvastatin (LIPITOR) 10 MG tablet Take 1 tablet (10 mg total) by mouth daily.  90 tablet  0  . dorzolamide-timolol (COSOPT) 22.3-6.8 MG/ML ophthalmic solution Place 1 drop into both eyes 2 (two) times daily.        . fexofenadine (ALLEGRA) 180 MG tablet Take 180 mg by mouth daily.        Marland Kitchen latanoprost (XALATAN) 0.005 % ophthalmic solution Place 1 drop into both eyes at bedtime.        Marland Kitchen  Multiple Vitamins-Minerals (MENS MULTI VITAMIN & MINERAL) TABS Take 1 tablet by mouth daily.        . niacin (NIASPAN) 500 MG CR tablet Take 1 tablet (500 mg total) by mouth at bedtime.  30 tablet  6  . Polyvinyl Alcohol-Povidone (REFRESH OP) Apply to eye 2 (two) times daily.      . TRAVATAN Z 0.004 % SOLN ophthalmic solution       . valsartan (DIOVAN) 80 MG tablet Take 1 tablet (80 mg total) by mouth daily.  90 tablet  1  . Calcium Carbonate-Vitamin D (CALTRATE 600+D) 600-400 MG-UNIT per tablet Take 1 tablet by mouth daily.         No current facility-administered medications on file prior to visit.     Review of Systems  Constitutional: Negative for fever, chills, activity change, appetite change, fatigue and unexpected  weight change.  HENT: Negative for hearing loss.   Eyes: Negative for visual disturbance.  Respiratory: Negative for cough, chest tightness, shortness of breath and wheezing.   Cardiovascular: Negative for chest pain, palpitations and leg swelling.  Gastrointestinal: Negative for nausea, vomiting, abdominal pain, diarrhea, constipation, blood in stool and abdominal distention.  Genitourinary: Negative for hematuria and difficulty urinating.  Musculoskeletal: Negative for arthralgias, myalgias and neck pain.  Skin: Negative for rash.  Neurological: Negative for dizziness, seizures, syncope and headaches.  Hematological: Negative for adenopathy. Does not bruise/bleed easily.  Psychiatric/Behavioral: Negative for dysphoric mood. The patient is not nervous/anxious.        Objective:   Physical Exam  Nursing note and vitals reviewed. Constitutional: He is oriented to person, place, and time. He appears well-developed and well-nourished. No distress.  HENT:  Head: Normocephalic and atraumatic.  Right Ear: External ear normal.  Left Ear: External ear normal.  Nose: Nose normal.  Mouth/Throat: Oropharynx is clear and moist. No oropharyngeal exudate.  Eyes: Conjunctivae and EOM are normal. Pupils are equal, round, and reactive to light. No scleral icterus.  Neck: Normal range of motion. Neck supple. Carotid bruit is not present. No thyromegaly present.  Cardiovascular: Normal rate, regular rhythm, normal heart sounds and intact distal pulses.   No murmur heard. Pulses:      Radial pulses are 2+ on the right side, and 2+ on the left side.  Pulmonary/Chest: Effort normal and breath sounds normal. No respiratory distress. He has no wheezes. He has no rales.  Abdominal: Soft. Bowel sounds are normal. He exhibits no distension and no mass. There is no tenderness. There is no rebound and no guarding.  Genitourinary:  deferred  Musculoskeletal: Normal range of motion. He exhibits no edema.   Lymphadenopathy:    He has no cervical adenopathy.  Neurological: He is alert and oriented to person, place, and time.  CN grossly intact, station and gait intact  Skin: Skin is warm and dry. No rash noted.  Psychiatric: He has a normal mood and affect. His behavior is normal. Judgment and thought content normal.  3/3 words recall. Normal serial 3's       Assessment & Plan:

## 2013-01-04 NOTE — Assessment & Plan Note (Signed)
Chronic, very stable on current regimen. Continue meds.

## 2013-01-04 NOTE — Progress Notes (Signed)
Pre-visit discussion using our clinic review tool. No additional management support is needed unless otherwise documented below in the visit note.  

## 2013-02-19 DIAGNOSIS — N402 Nodular prostate without lower urinary tract symptoms: Secondary | ICD-10-CM | POA: Diagnosis not present

## 2013-02-26 DIAGNOSIS — N401 Enlarged prostate with lower urinary tract symptoms: Secondary | ICD-10-CM | POA: Diagnosis not present

## 2013-02-26 DIAGNOSIS — N402 Nodular prostate without lower urinary tract symptoms: Secondary | ICD-10-CM | POA: Diagnosis not present

## 2013-02-26 DIAGNOSIS — N139 Obstructive and reflux uropathy, unspecified: Secondary | ICD-10-CM | POA: Diagnosis not present

## 2013-02-28 DIAGNOSIS — H25049 Posterior subcapsular polar age-related cataract, unspecified eye: Secondary | ICD-10-CM | POA: Diagnosis not present

## 2013-02-28 DIAGNOSIS — H409 Unspecified glaucoma: Secondary | ICD-10-CM | POA: Diagnosis not present

## 2013-02-28 DIAGNOSIS — H4011X0 Primary open-angle glaucoma, stage unspecified: Secondary | ICD-10-CM | POA: Diagnosis not present

## 2013-02-28 DIAGNOSIS — H43819 Vitreous degeneration, unspecified eye: Secondary | ICD-10-CM | POA: Diagnosis not present

## 2013-03-04 ENCOUNTER — Encounter: Payer: Self-pay | Admitting: Family Medicine

## 2013-03-20 ENCOUNTER — Telehealth: Payer: Self-pay | Admitting: Family Medicine

## 2013-03-20 DIAGNOSIS — I10 Essential (primary) hypertension: Secondary | ICD-10-CM

## 2013-03-20 MED ORDER — LOSARTAN POTASSIUM 100 MG PO TABS: 100.0000 mg | ORAL_TABLET | Freq: Every day | ORAL | Status: DC

## 2013-03-20 NOTE — Telephone Encounter (Signed)
Pt's wife left message saying that the price of pt's Diovan RX increased and they are not able to afford it.  She wants to know if they can try an alternative BP med.

## 2013-03-20 NOTE — Telephone Encounter (Signed)
May try losartan 100mg  daily - I've sent this into pharmacy 1 mo supply while we ensure tolerated well.  Return 2 weeks after starting to check kidney function on new med. BP Readings from Last 3 Encounters:  01/04/13 116/78  03/20/12 127/82  02/09/12 126/74   Lab Results  Component Value Date   CREATININE 0.8 01/01/2013

## 2013-03-21 NOTE — Telephone Encounter (Signed)
Patient's wife notified and lab appt scheduled.

## 2013-04-04 ENCOUNTER — Other Ambulatory Visit: Payer: Self-pay | Admitting: *Deleted

## 2013-04-04 MED ORDER — NIACIN ER (ANTIHYPERLIPIDEMIC) 500 MG PO TBCR: 500.0000 mg | EXTENDED_RELEASE_TABLET | Freq: Every day | ORAL | Status: DC

## 2013-04-09 ENCOUNTER — Other Ambulatory Visit: Payer: Self-pay | Admitting: *Deleted

## 2013-04-09 MED ORDER — ATORVASTATIN CALCIUM 10 MG PO TABS: 10.0000 mg | ORAL_TABLET | Freq: Every day | ORAL | Status: DC

## 2013-05-02 ENCOUNTER — Other Ambulatory Visit (INDEPENDENT_AMBULATORY_CARE_PROVIDER_SITE_OTHER): Payer: Medicare Other

## 2013-05-02 DIAGNOSIS — I10 Essential (primary) hypertension: Secondary | ICD-10-CM

## 2013-05-02 LAB — CREATININE, SERUM: Creatinine, Ser: 0.8 mg/dL (ref 0.4–1.5)

## 2013-05-30 ENCOUNTER — Other Ambulatory Visit: Payer: Self-pay

## 2013-05-30 DIAGNOSIS — I10 Essential (primary) hypertension: Secondary | ICD-10-CM

## 2013-05-30 MED ORDER — LOSARTAN POTASSIUM 100 MG PO TABS: 100.0000 mg | ORAL_TABLET | Freq: Every day | ORAL | Status: DC

## 2013-05-30 MED ORDER — ATORVASTATIN CALCIUM 10 MG PO TABS: 10.0000 mg | ORAL_TABLET | Freq: Every day | ORAL | Status: DC

## 2013-06-01 NOTE — Telephone Encounter (Signed)
Mrs Stfort wanted to verify pt was to continue taking med;Mrs Kozakiewicz said she did not request refill and will ck with pharmacy. Pt does take both meds and both meds are on pts med list. If Mrs Poole has further questions she will cb.

## 2013-07-31 DIAGNOSIS — H4011X0 Primary open-angle glaucoma, stage unspecified: Secondary | ICD-10-CM | POA: Diagnosis not present

## 2013-07-31 DIAGNOSIS — H2510 Age-related nuclear cataract, unspecified eye: Secondary | ICD-10-CM | POA: Diagnosis not present

## 2013-07-31 DIAGNOSIS — H47239 Glaucomatous optic atrophy, unspecified eye: Secondary | ICD-10-CM | POA: Diagnosis not present

## 2013-08-28 ENCOUNTER — Other Ambulatory Visit: Payer: Self-pay | Admitting: *Deleted

## 2013-08-28 DIAGNOSIS — I10 Essential (primary) hypertension: Secondary | ICD-10-CM

## 2013-08-28 MED ORDER — LOSARTAN POTASSIUM 100 MG PO TABS: 100.0000 mg | ORAL_TABLET | Freq: Every day | ORAL | Status: DC

## 2013-11-05 ENCOUNTER — Ambulatory Visit (INDEPENDENT_AMBULATORY_CARE_PROVIDER_SITE_OTHER): Payer: Medicare Other

## 2013-11-05 DIAGNOSIS — Z23 Encounter for immunization: Secondary | ICD-10-CM

## 2013-11-09 ENCOUNTER — Encounter: Payer: Self-pay | Admitting: Gastroenterology

## 2013-11-26 ENCOUNTER — Other Ambulatory Visit: Payer: Self-pay | Admitting: *Deleted

## 2013-11-26 MED ORDER — ATORVASTATIN CALCIUM 10 MG PO TABS: 10.0000 mg | ORAL_TABLET | Freq: Every day | ORAL | Status: DC

## 2013-11-30 DIAGNOSIS — H2513 Age-related nuclear cataract, bilateral: Secondary | ICD-10-CM | POA: Diagnosis not present

## 2013-12-03 DIAGNOSIS — H2512 Age-related nuclear cataract, left eye: Secondary | ICD-10-CM | POA: Diagnosis not present

## 2013-12-03 DIAGNOSIS — H269 Unspecified cataract: Secondary | ICD-10-CM | POA: Diagnosis not present

## 2013-12-03 DIAGNOSIS — H25811 Combined forms of age-related cataract, right eye: Secondary | ICD-10-CM | POA: Diagnosis not present

## 2014-01-28 ENCOUNTER — Other Ambulatory Visit: Payer: Self-pay | Admitting: *Deleted

## 2014-01-28 MED ORDER — NIACIN ER (ANTIHYPERLIPIDEMIC) 500 MG PO TBCR: EXTENDED_RELEASE_TABLET | ORAL | Status: DC

## 2014-02-12 ENCOUNTER — Other Ambulatory Visit: Payer: Self-pay | Admitting: *Deleted

## 2014-02-12 DIAGNOSIS — I10 Essential (primary) hypertension: Secondary | ICD-10-CM

## 2014-02-12 MED ORDER — LOSARTAN POTASSIUM 100 MG PO TABS
ORAL_TABLET | ORAL | Status: DC
Start: 2014-02-12 — End: 2014-03-20

## 2014-03-08 ENCOUNTER — Other Ambulatory Visit: Payer: Self-pay | Admitting: *Deleted

## 2014-03-08 MED ORDER — ATORVASTATIN CALCIUM 10 MG PO TABS: 10.0000 mg | ORAL_TABLET | Freq: Every day | ORAL | Status: DC

## 2014-03-10 ENCOUNTER — Other Ambulatory Visit: Payer: Self-pay | Admitting: Family Medicine

## 2014-03-10 DIAGNOSIS — E785 Hyperlipidemia, unspecified: Secondary | ICD-10-CM

## 2014-03-10 DIAGNOSIS — I10 Essential (primary) hypertension: Secondary | ICD-10-CM

## 2014-03-12 ENCOUNTER — Other Ambulatory Visit (INDEPENDENT_AMBULATORY_CARE_PROVIDER_SITE_OTHER): Payer: Medicare Other

## 2014-03-12 DIAGNOSIS — E785 Hyperlipidemia, unspecified: Secondary | ICD-10-CM | POA: Diagnosis not present

## 2014-03-12 DIAGNOSIS — I10 Essential (primary) hypertension: Secondary | ICD-10-CM

## 2014-03-12 LAB — LIPID PANEL
CHOL/HDL RATIO: 2
Cholesterol: 137 mg/dL (ref 0–200)
HDL: 55.1 mg/dL (ref >39.00)
LDL Cholesterol: 68 mg/dL (ref 0–99)
NonHDL: 81.9
Triglycerides: 72 mg/dL (ref 0.0–149.0)
VLDL: 14.4 mg/dL (ref 0.0–40.0)

## 2014-03-12 LAB — CBC WITH DIFFERENTIAL/PLATELET
BASOS PCT: 0.6 % (ref 0.0–3.0)
Basophils Absolute: 0 10*3/uL (ref 0.0–0.1)
Eosinophils Absolute: 0.5 10*3/uL (ref 0.0–0.7)
Eosinophils Relative: 6.7 % — ABNORMAL HIGH (ref 0.0–5.0)
HEMATOCRIT: 44.5 % (ref 39.0–52.0)
Hemoglobin: 14.6 g/dL (ref 13.0–17.0)
LYMPHS ABS: 2.3 10*3/uL (ref 0.7–4.0)
LYMPHS PCT: 32.3 % (ref 12.0–46.0)
MCHC: 32.9 g/dL (ref 30.0–36.0)
MCV: 95 fl (ref 78.0–100.0)
MONO ABS: 0.8 10*3/uL (ref 0.1–1.0)
MONOS PCT: 10.8 % (ref 3.0–12.0)
NEUTROS ABS: 3.5 10*3/uL (ref 1.4–7.7)
NEUTROS PCT: 49.6 % (ref 43.0–77.0)
PLATELETS: 219 10*3/uL (ref 150.0–400.0)
RBC: 4.69 Mil/uL (ref 4.22–5.81)
RDW: 13.8 % (ref 11.5–15.5)
WBC: 7.1 10*3/uL (ref 4.0–10.5)

## 2014-03-12 LAB — COMPREHENSIVE METABOLIC PANEL
ALT: 18 U/L (ref 0–53)
AST: 22 U/L (ref 0–37)
Albumin: 4 g/dL (ref 3.5–5.2)
Alkaline Phosphatase: 50 U/L (ref 39–117)
BUN: 23 mg/dL (ref 6–23)
CO2: 28 mEq/L (ref 19–32)
Calcium: 9.4 mg/dL (ref 8.4–10.5)
Chloride: 103 mEq/L (ref 96–112)
Creatinine, Ser: 0.81 mg/dL (ref 0.40–1.50)
GFR: 96.53 mL/min (ref >60.00)
GLUCOSE: 112 mg/dL — AB (ref 70–99)
POTASSIUM: 4.1 meq/L (ref 3.5–5.1)
Sodium: 136 mEq/L (ref 135–145)
TOTAL PROTEIN: 6.9 g/dL (ref 6.0–8.3)
Total Bilirubin: 0.7 mg/dL (ref 0.2–1.2)

## 2014-03-12 LAB — TSH: TSH: 1.69 u[IU]/mL (ref 0.35–4.50)

## 2014-03-12 LAB — MICROALBUMIN / CREATININE URINE RATIO
Creatinine,U: 158.7 mg/dL
Microalb Creat Ratio: 0.4 mg/g (ref 0.0–30.0)
Microalb, Ur: 0.7 mg/dL (ref 0.0–1.9)

## 2014-03-20 ENCOUNTER — Encounter: Payer: Self-pay | Admitting: Family Medicine

## 2014-03-20 ENCOUNTER — Telehealth: Payer: Self-pay | Admitting: Family Medicine

## 2014-03-20 ENCOUNTER — Ambulatory Visit (INDEPENDENT_AMBULATORY_CARE_PROVIDER_SITE_OTHER): Payer: Medicare Other | Admitting: Family Medicine

## 2014-03-20 VITALS — BP 128/74 | HR 68 | Temp 98.1°F | Ht 67.5 in | Wt 173.2 lb

## 2014-03-20 DIAGNOSIS — R011 Cardiac murmur, unspecified: Secondary | ICD-10-CM

## 2014-03-20 DIAGNOSIS — Z23 Encounter for immunization: Secondary | ICD-10-CM | POA: Diagnosis not present

## 2014-03-20 DIAGNOSIS — Z7189 Other specified counseling: Secondary | ICD-10-CM | POA: Insufficient documentation

## 2014-03-20 DIAGNOSIS — E785 Hyperlipidemia, unspecified: Secondary | ICD-10-CM

## 2014-03-20 DIAGNOSIS — I10 Essential (primary) hypertension: Secondary | ICD-10-CM

## 2014-03-20 DIAGNOSIS — H409 Unspecified glaucoma: Secondary | ICD-10-CM

## 2014-03-20 DIAGNOSIS — R413 Other amnesia: Secondary | ICD-10-CM

## 2014-03-20 DIAGNOSIS — G3184 Mild cognitive impairment, so stated: Secondary | ICD-10-CM | POA: Insufficient documentation

## 2014-03-20 DIAGNOSIS — Z0000 Encounter for general adult medical examination without abnormal findings: Secondary | ICD-10-CM | POA: Diagnosis not present

## 2014-03-20 MED ORDER — NIACIN ER (ANTIHYPERLIPIDEMIC) 500 MG PO TBCR: EXTENDED_RELEASE_TABLET | ORAL | Status: DC

## 2014-03-20 MED ORDER — LOSARTAN POTASSIUM 100 MG PO TABS
ORAL_TABLET | ORAL | Status: DC
Start: 2014-03-20 — End: 2015-03-11

## 2014-03-20 MED ORDER — ATORVASTATIN CALCIUM 10 MG PO TABS: 5.0000 mg | ORAL_TABLET | Freq: Every day | ORAL | Status: DC

## 2014-03-20 MED ORDER — ATORVASTATIN CALCIUM 10 MG PO TABS: 10.0000 mg | ORAL_TABLET | Freq: Every day | ORAL | Status: DC

## 2014-03-20 NOTE — Assessment & Plan Note (Signed)
Not appreciated today.  

## 2014-03-20 NOTE — Assessment & Plan Note (Addendum)
Wife endorses recent deterioration mainly short term memory and trouble finding way when driving to familiar locations. On limited testing he did struggle more this time than last time on recall and mildly on calculation. No fmhx dementia. Will continue to monitor, and return sooner if continued progressive decline noted for further eval and discussion. Encouraged he start b complex vitamin as well as keep mind active with reading and crosswords/puzzles.  Will also decrease lipitor to 5mg  daily. Consider d/c in future.

## 2014-03-20 NOTE — Patient Instructions (Addendum)
prevnar today. Make sure you stay well hydrated with plenty of water. Look into b complex vitamin. Keep mentally active with books and word puzzles Watch memory - if worsening come in for further evaluation.

## 2014-03-20 NOTE — Telephone Encounter (Signed)
plz notify patient/wife - I think he'll do just as well on 1/2 lipitor dose - let's decrease to 1/2 tablet daily. Sent adjustment to pharmacy.

## 2014-03-20 NOTE — Assessment & Plan Note (Signed)
Chronic, stable on losartan 100mg  daily. Continue.

## 2014-03-20 NOTE — Assessment & Plan Note (Signed)
Regularly sees Ophtho.

## 2014-03-20 NOTE — Progress Notes (Signed)
BP 128/74 mmHg  Pulse 68  Temp(Src) 98.1 F (36.7 C) (Oral)  Ht 5' 7.5" (1.715 m)  Wt 173 lb 4 oz (78.586 kg)  BMI 26.72 kg/m2   CC: medicare wellness visit  Subjective:    Patient ID: [name], male    DOB: 1928-02-28, 81 y.o.   MRN: [id number]  HPI: [name] is a 81 y.o. male presenting on 03/20/2014 for Annual Exam   Expecting 4th great grandchild.   Prostate - h/o "rough spot", sees Dr. Diona Fanti every year. To see 02/27/2012. Told last PSA was "a little high".  Wife notices some short term memory trouble - asking question repeatedly and asking out to get somewhere that he's gone multiple times in the past.  Hearing - doing well. Uses aids daily.  Vision - doing well, followed by Dr Venetia Maxon in [place] in [place]. Due for recheck next week to see him. Sees Q6 mo regularly with h/o glaucoma. Cataract removed 11/2013 Had a fall yesterday - slipped on ice. No injury. Braced himself with hand. Denies depression/anhedonia, sadness.   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 (Dr Deatra Ina). No fmhx colon cancer.  Prostate - see above. No fmhx prostate cancer. Checked by Dr Diona Fanti Flu shot 10/2013 Pneumovax 2007. prevnar - today. Tetanus 2004.  Shingles - 2012.  Living will - have at home, children know, 2 children are co-HC POA and co-beneficiaries. Would desire temporary life support but not prolonged.  Minimal caffeine Lives with wife, [name]. No pets Nutritional therapist; working for [employer], [employer] currently; retired from [employer]: 12th grade education Activity: stays active outside Diet: good water, fruits/vegetables daily   Relevant past medical, surgical, family and social history reviewed and updated as indicated. Interim medical history since our last visit reviewed. Allergies and medications reviewed and updated. Current Outpatient Prescriptions on File Prior to Visit  Medication Sig  . aspirin 325 MG tablet Take 325 mg by mouth daily.    .  cholecalciferol (VITAMIN D) 1000 UNITS tablet Take 1,000 Units by mouth daily.  Marland Kitchen docusate sodium (COLACE) 100 MG capsule Take 100 mg by mouth daily.  . dorzolamide-timolol (COSOPT) 22.3-6.8 MG/ML ophthalmic solution Place 1 drop into both eyes 2 (two) times daily.    . fexofenadine (ALLEGRA) 180 MG tablet Take 180 mg by mouth daily.    Marland Kitchen latanoprost (XALATAN) 0.005 % ophthalmic solution Place 1 drop into both eyes at bedtime.    . Multiple Vitamins-Minerals (MENS MULTI VITAMIN & MINERAL) TABS Take 1 tablet by mouth daily.    . Polyvinyl Alcohol-Povidone (REFRESH OP) Apply to eye 2 (two) times daily.  . TRAVATAN Z 0.004 % SOLN ophthalmic solution    No current facility-administered medications on file prior to visit.    Review of Systems Per HPI unless specifically indicated above     Objective:    BP 128/74 mmHg  Pulse 68  Temp(Src) 98.1 F (36.7 C) (Oral)  Ht 5' 7.5" (1.715 m)  Wt 173 lb 4 oz (78.586 kg)  BMI 26.72 kg/m2  Wt Readings from Last 3 Encounters:  03/20/14 173 lb 4 oz (78.586 kg)  01/04/13 166 lb 8 oz (75.524 kg)  03/20/12 165 lb (74.844 kg)    Physical Exam  Constitutional: He is oriented to person, place, and time. He appears well-developed and well-nourished. No distress.  HENT:  Head: Normocephalic and atraumatic.  Right Ear: Hearing, tympanic membrane, external ear and ear canal normal.  Left Ear: Hearing, tympanic membrane, external ear and  ear canal normal.  Nose: Nose normal.  Mouth/Throat: Uvula is midline, oropharynx is clear and moist and mucous membranes are normal. No oropharyngeal exudate, posterior oropharyngeal edema or posterior oropharyngeal erythema.  Eyes: Conjunctivae and EOM are normal. Pupils are equal, round, and reactive to light. No scleral icterus.  Neck: Normal range of motion. Neck supple. Carotid bruit is not present. No thyromegaly present.  Cardiovascular: Normal rate, regular rhythm, normal heart sounds and intact distal pulses.     No murmur heard. Pulses:      Radial pulses are 2+ on the right side, and 2+ on the left side.  Pulmonary/Chest: Effort normal and breath sounds normal. No respiratory distress. He has no wheezes. He has no rales.  Abdominal: Soft. Bowel sounds are normal. He exhibits no distension and no mass. There is no tenderness. There is no rebound and no guarding.  Genitourinary:  deferred  Musculoskeletal: Normal range of motion. He exhibits no edema.  Lymphadenopathy:    He has no cervical adenopathy.  Neurological: He is alert and oriented to person, place, and time.  CN grossly intact, station and gait intact Recall 1/3, 2/3 with cue Calculation:  5/5 serial 3s 4/5 serial 7s 1/5 D-O...  Skin: Skin is warm and dry. No rash noted.  Psychiatric: He has a normal mood and affect. His behavior is normal. Judgment and thought content normal.  Nursing note and vitals reviewed.  Results for orders placed or performed in visit on 03/12/14  Microalbumin / creatinine urine ratio  Result Value Ref Range   Microalb, Ur 0.7 0.0 - 1.9 mg/dL   Creatinine,U 158.7 mg/dL   Microalb Creat Ratio 0.4 0.0 - 30.0 mg/g  Lipid panel  Result Value Ref Range   Cholesterol 137 0 - 200 mg/dL   Triglycerides 72.0 0.0 - 149.0 mg/dL   HDL 55.10 >39.00 mg/dL   VLDL 14.4 0.0 - 40.0 mg/dL   LDL Cholesterol 68 0 - 99 mg/dL   Total CHOL/HDL Ratio 2    NonHDL 81.90   Comprehensive metabolic panel  Result Value Ref Range   Sodium 136 135 - 145 mEq/L   Potassium 4.1 3.5 - 5.1 mEq/L   Chloride 103 96 - 112 mEq/L   CO2 28 19 - 32 mEq/L   Glucose, Bld 112 (H) 70 - 99 mg/dL   BUN 23 6 - 23 mg/dL   Creatinine, Ser 0.81 0.40 - 1.50 mg/dL   Total Bilirubin 0.7 0.2 - 1.2 mg/dL   Alkaline Phosphatase 50 39 - 117 U/L   AST 22 0 - 37 U/L   ALT 18 0 - 53 U/L   Total Protein 6.9 6.0 - 8.3 g/dL   Albumin 4.0 3.5 - 5.2 g/dL   Calcium 9.4 8.4 - 10.5 mg/dL   GFR 96.53 >60.00 mL/min  TSH  Result Value Ref Range   TSH 1.69  0.35 - 4.50 uIU/mL  CBC with Differential  Result Value Ref Range   WBC 7.1 4.0 - 10.5 K/uL   RBC 4.69 4.22 - 5.81 Mil/uL   Hemoglobin 14.6 13.0 - 17.0 g/dL   HCT 44.5 39.0 - 52.0 %   MCV 95.0 78.0 - 100.0 fl   MCHC 32.9 30.0 - 36.0 g/dL   RDW 13.8 11.5 - 15.5 %   Platelets 219.0 150.0 - 400.0 K/uL   Neutrophils Relative % 49.6 43.0 - 77.0 %   Lymphocytes Relative 32.3 12.0 - 46.0 %   Monocytes Relative 10.8 3.0 - 12.0 %  Eosinophils Relative 6.7 (H) 0.0 - 5.0 %   Basophils Relative 0.6 0.0 - 3.0 %   Neutro Abs 3.5 1.4 - 7.7 K/uL   Lymphs Abs 2.3 0.7 - 4.0 K/uL   Monocytes Absolute 0.8 0.1 - 1.0 K/uL   Eosinophils Absolute 0.5 0.0 - 0.7 K/uL   Basophils Absolute 0.0 0.0 - 0.1 K/uL      Assessment & Plan:   Problem List Items Addressed This Visit    Memory deficit    Wife endorses recent deterioration mainly short term memory and trouble finding way when driving to familiar locations. On limited testing he did struggle more this time than last time on recall and mildly on calculation. No fmhx dementia. Will continue to monitor, and return sooner if continued progressive decline noted for further eval and discussion. Encouraged he start b complex vitamin as well as keep mind active with reading and crosswords/puzzles.  Will also decrease lipitor to 5mg  daily. Consider d/c in future.      Medicare annual wellness visit, subsequent - Primary    I have personally reviewed the Medicare Annual Wellness questionnaire and have noted 1. The patient's medical and social history 2. Their use of alcohol, tobacco or illicit drugs 3. Their current medications and supplements 4. The patient's functional ability including ADL's, fall risks, home safety risks and hearing or visual impairment. 5. Diet and physical activity 6. Evidence for depression or mood disorders The patients weight, height, BMI have been recorded in the chart.  Hearing and vision has been addressed. I have made  referrals, counseling and provided education to the patient based review of the above and I have provided the pt with a written personalized care plan for preventive services. Provider list updated - see scanned questionairre. Reviewed preventative protocols and updated unless pt declined.       Glaucoma    Regularly sees Ophtho.      Essential hypertension    Chronic, stable on losartan 100mg  daily. Continue.      Relevant Medications   losartan (COZAAR) 100 MG tablet   niacin (NIASPAN) CR tablet   atorvastatin (LIPITOR) tablet   Dyslipidemia    Chronic, stable. I think he'll do well on lower lipitor dose - start 5mg  (1/2 tab) nightly.      Relevant Medications   niacin (NIASPAN) CR tablet   atorvastatin (LIPITOR) tablet   Cardiac murmur    Not appreciated today.      Advanced care planning/counseling discussion    Living will - have at home, children know, 2 children are co-HC POA and co-beneficiaries. Would desire temporary life support but not prolonged.       Other Visit Diagnoses    Need for vaccination with 13-polyvalent pneumococcal conjugate vaccine        Relevant Orders    Pneumococcal conjugate vaccine 13-valent (Completed)        Follow up plan: Return in about 1 year (around 03/21/2015), or as needed, for medicare wellness visit.

## 2014-03-20 NOTE — Assessment & Plan Note (Signed)
Chronic, stable. I think he'll do well on lower lipitor dose - start 5mg  (1/2 tab) nightly.

## 2014-03-20 NOTE — Assessment & Plan Note (Addendum)
Living will - have at home, children know, 2 children are co-HC POA and co-beneficiaries. Would desire temporary life support but not prolonged.

## 2014-03-20 NOTE — Assessment & Plan Note (Signed)

## 2014-03-21 ENCOUNTER — Telehealth: Payer: Self-pay | Admitting: Family Medicine

## 2014-03-21 NOTE — Telephone Encounter (Signed)
emmi mailed  °

## 2014-03-21 NOTE — Telephone Encounter (Signed)
Patient's wife notified and verbalized understanding.  

## 2014-03-22 DIAGNOSIS — N402 Nodular prostate without lower urinary tract symptoms: Secondary | ICD-10-CM | POA: Diagnosis not present

## 2014-04-25 DIAGNOSIS — H4011X3 Primary open-angle glaucoma, severe stage: Secondary | ICD-10-CM | POA: Diagnosis not present

## 2014-10-10 ENCOUNTER — Ambulatory Visit (INDEPENDENT_AMBULATORY_CARE_PROVIDER_SITE_OTHER): Payer: Medicare Other | Admitting: Family Medicine

## 2014-10-10 ENCOUNTER — Encounter: Payer: Self-pay | Admitting: Family Medicine

## 2014-10-10 VITALS — BP 110/68 | HR 80 | Temp 97.7°F | Wt 174.2 lb

## 2014-10-10 DIAGNOSIS — M545 Low back pain, unspecified: Secondary | ICD-10-CM | POA: Insufficient documentation

## 2014-10-10 DIAGNOSIS — L989 Disorder of the skin and subcutaneous tissue, unspecified: Secondary | ICD-10-CM | POA: Insufficient documentation

## 2014-10-10 NOTE — Progress Notes (Signed)
BP 110/68 mmHg  Pulse 80  Temp(Src) 97.7 F (36.5 C) (Oral)  Wt 174 lb 3.2 oz (79.017 kg)  SpO2 99%   CC: check skin spots  Subjective:    Patient ID: [name], male    DOB: [birth year], 81 y.o.   MRN: [id number]  HPI: [name] is a 81 y.o. male presenting on 10/10/2014 for Acute Visit   Mole R neck that seems to be getting darker.   Also with L cheek that scales then peels.  Also with persistent lower back pain. Worse pain with prolonged walking on hard floor and riding lawnmower. Takes 325mg  aspirin. Bothering him for years. Denies radiation down legs, numbness or weakness of legs, fevers/chills, bowel/bladder accidents.  Relevant past medical, surgical, family and social history reviewed and updated as indicated. Interim medical history since our last visit reviewed. Allergies and medications reviewed and updated. Current Outpatient Prescriptions on File Prior to Visit  Medication Sig  . aspirin 325 MG tablet Take 325 mg by mouth daily.    Marland Kitchen atorvastatin (LIPITOR) 10 MG tablet Take 0.5 tablets (5 mg total) by mouth daily.  . cholecalciferol (VITAMIN D) 1000 UNITS tablet Take 1,000 Units by mouth daily.  Marland Kitchen docusate sodium (COLACE) 100 MG capsule Take 100 mg by mouth daily.  . dorzolamide-timolol (COSOPT) 22.3-6.8 MG/ML ophthalmic solution Place 1 drop into both eyes 2 (two) times daily.    . fexofenadine (ALLEGRA) 180 MG tablet Take 180 mg by mouth daily.    Marland Kitchen latanoprost (XALATAN) 0.005 % ophthalmic solution Place 1 drop into both eyes at bedtime.    Marland Kitchen losartan (COZAAR) 100 MG tablet Take one tablet daily  . Multiple Vitamins-Minerals (MENS MULTI VITAMIN & MINERAL) TABS Take 1 tablet by mouth daily.    . niacin (NIASPAN) 500 MG CR tablet Take one tablet at bedtime  . Polyvinyl Alcohol-Povidone (REFRESH OP) Apply to eye 2 (two) times daily.  . TRAVATAN Z 0.004 % SOLN ophthalmic solution    No current facility-administered medications on file prior to visit.     Review of Systems Per HPI unless specifically indicated above     Objective:    BP 110/68 mmHg  Pulse 80  Temp(Src) 97.7 F (36.5 C) (Oral)  Wt 174 lb 3.2 oz (79.017 kg)  SpO2 99%  Wt Readings from Last 3 Encounters:  10/10/14 174 lb 3.2 oz (79.017 kg)  03/20/14 173 lb 4 oz (78.586 kg)  01/04/13 166 lb 8 oz (75.524 kg)    Physical Exam  Constitutional: He appears well-developed and well-nourished. No distress.  Musculoskeletal: He exhibits no edema.  No pain midline spine No paraspinous mm tenderness  Skin: Skin is warm and dry. No rash noted. No erythema.  R neck with 53mm pigmented lesion with stuck on appearance L cheek with scaly lesion with surrounding telangectasia, nontender  Nursing note and vitals reviewed.     Assessment & Plan:   Problem List Items Addressed This Visit    Skin lesions    L cheek - possible AK - declines cryotherapy. rec vaseline for next few weeks. If persistent trouble, will LN2 next visit R neck - consistent with pigmented SK. Reassured. Not enlarging.      Lower back pain - Primary    Anticipate osteoarthritis related.  Will recommend tylenol 500mg  nightly as needed.  Update [place] with effect.      Relevant Medications   acetaminophen (TYLENOL) 500 MG tablet       Follow  up plan: Return if symptoms worsen or fail to improve.

## 2014-10-10 NOTE — Assessment & Plan Note (Signed)
L cheek - possible AK - declines cryotherapy. rec vaseline for next few weeks. If persistent trouble, will LN2 next visit R neck - consistent with pigmented SK. Reassured. Not enlarging.

## 2014-10-10 NOTE — Progress Notes (Signed)
Pre visit review using our clinic review tool, if applicable. No additional management support is needed unless otherwise documented below in the visit note. 

## 2014-10-10 NOTE — Assessment & Plan Note (Signed)
Anticipate osteoarthritis related.  Will recommend tylenol 500mg  nightly as needed.  Update [place] with effect.

## 2014-10-10 NOTE — Patient Instructions (Signed)
For spot on neck - looking ok For spot on cheek - try vaseline. If not better by next visit we may freeze this off. Try tylenol 500mg  with dinner every night to see if back pain gets better. I think this is from osteoarthritis (or wear and tear arthritis)

## 2014-10-31 DIAGNOSIS — H47233 Glaucomatous optic atrophy, bilateral: Secondary | ICD-10-CM | POA: Diagnosis not present

## 2014-10-31 DIAGNOSIS — H4011X3 Primary open-angle glaucoma, severe stage: Secondary | ICD-10-CM | POA: Diagnosis not present

## 2014-11-11 ENCOUNTER — Ambulatory Visit (INDEPENDENT_AMBULATORY_CARE_PROVIDER_SITE_OTHER): Payer: Medicare Other

## 2014-11-11 DIAGNOSIS — Z23 Encounter for immunization: Secondary | ICD-10-CM | POA: Diagnosis not present

## 2015-01-30 DIAGNOSIS — H2512 Age-related nuclear cataract, left eye: Secondary | ICD-10-CM | POA: Diagnosis not present

## 2015-01-30 DIAGNOSIS — H47233 Glaucomatous optic atrophy, bilateral: Secondary | ICD-10-CM | POA: Diagnosis not present

## 2015-03-11 ENCOUNTER — Other Ambulatory Visit: Payer: Self-pay | Admitting: *Deleted

## 2015-03-11 DIAGNOSIS — I10 Essential (primary) hypertension: Secondary | ICD-10-CM

## 2015-03-11 MED ORDER — LOSARTAN POTASSIUM 100 MG PO TABS: ORAL_TABLET | ORAL | Status: DC

## 2015-03-18 ENCOUNTER — Other Ambulatory Visit: Payer: Self-pay | Admitting: Family Medicine

## 2015-03-18 DIAGNOSIS — E785 Hyperlipidemia, unspecified: Secondary | ICD-10-CM

## 2015-03-18 DIAGNOSIS — I10 Essential (primary) hypertension: Secondary | ICD-10-CM

## 2015-03-19 ENCOUNTER — Other Ambulatory Visit (INDEPENDENT_AMBULATORY_CARE_PROVIDER_SITE_OTHER): Payer: Medicare Other

## 2015-03-19 DIAGNOSIS — I10 Essential (primary) hypertension: Secondary | ICD-10-CM

## 2015-03-19 DIAGNOSIS — E785 Hyperlipidemia, unspecified: Secondary | ICD-10-CM | POA: Diagnosis not present

## 2015-03-19 LAB — LIPID PANEL
CHOL/HDL RATIO: 3
CHOLESTEROL: 150 mg/dL (ref 0–200)
HDL: 52.9 mg/dL (ref >39.00)
LDL CALC: 77 mg/dL (ref 0–99)
NonHDL: 96.94
TRIGLYCERIDES: 98 mg/dL (ref 0.0–149.0)
VLDL: 19.6 mg/dL (ref 0.0–40.0)

## 2015-03-19 LAB — BASIC METABOLIC PANEL
BUN: 18 mg/dL (ref 6–23)
CO2: 28 mEq/L (ref 19–32)
CREATININE: 0.88 mg/dL (ref 0.40–1.50)
Calcium: 9.4 mg/dL (ref 8.4–10.5)
Chloride: 104 mEq/L (ref 96–112)
GFR: 87.51 mL/min (ref >60.00)
GLUCOSE: 101 mg/dL — AB (ref 70–99)
POTASSIUM: 4 meq/L (ref 3.5–5.1)
Sodium: 138 mEq/L (ref 135–145)

## 2015-03-25 ENCOUNTER — Ambulatory Visit (INDEPENDENT_AMBULATORY_CARE_PROVIDER_SITE_OTHER): Payer: Medicare Other

## 2015-03-25 ENCOUNTER — Telehealth: Payer: Self-pay

## 2015-03-25 ENCOUNTER — Encounter: Payer: Medicare Other | Admitting: Family Medicine

## 2015-03-25 VITALS — BP 100/62 | HR 57 | Temp 97.8°F | Ht 68.0 in | Wt 177.0 lb

## 2015-03-25 DIAGNOSIS — Z0000 Encounter for general adult medical examination without abnormal findings: Secondary | ICD-10-CM

## 2015-03-25 NOTE — Progress Notes (Addendum)
Patient or family member concerns reported during visit:  A. Hands are cold. Denies cyanosis of finger tips. Assessment: Capillary refill: within 2 seconds. O2 sat: 100%. Skin dry and intact.   B. Small white hard growth present to left cheek. Reports it has increased in size. Spouse states Vaseline being used as instructed.   C. Memory. Taking vitamin as prescribed. Spouse feels there has not been any change to current state of memory. Assessment: Failed memory screening despite numerous cues.

## 2015-03-25 NOTE — Progress Notes (Signed)
Pre visit review using our clinic review tool, if applicable. No additional management support is needed unless otherwise documented below in the visit note. 

## 2015-03-25 NOTE — Telephone Encounter (Signed)
Please note this message is being sent via encounter note due to issues with AWV template.  Patient/spouse concerns reported during visit:  A. Hands are cold. Denies cyanosis of finger tips. Assessment: Capillary refill: within 2 seconds. O2 sat: 100%. Skin dry and intact.   B. Small white hard growth present to left cheek. Reports it has increased in size. Spouse states Vaseline being used as instructed.   C. Memory. Taking vitamin as prescribed. Spouse feels there has not been any change to current state of memory. Assessment: Failed memory screening despite numerous cues.

## 2015-03-27 NOTE — Telephone Encounter (Signed)
Would suggest he wear gloves to keep hands warm. Would offer liquid nitrogen therapy to cheek lesion at next office visit. We can discuss memory at next office visit as well.

## 2015-03-27 NOTE — Progress Notes (Signed)
I have personally reviewed the Medicare Annual Wellness questionnaire and have noted 1. The patient's medical and social history 2. Their use of alcohol, tobacco or illicit drugs 3. Their current medications and supplements 4. The patient's functional ability including ADL's, fall risks, home safety risks and hearing or visual impairment. Cognitive function has been assessed and addressed as indicated.  5. Diet and physical activity 6. Evidence for depression or mood disorders The patients weight, height, BMI have been recorded in the chart. I have made referrals, counseling and provided education to the patient based on review of the above and I have provided the pt with a written personalized care plan for preventive services. Provider list updated. See scanned questionairre as needed for further documentation. Reviewed preventative protocols and updated unless pt declined.  Hearing screen - wears hearing aids;last doctor visit with [place] on 05/01/14  Vision screen - corrective lens worn, Last eye exam 01/30/15  Fall risk screen - No hx of falls within previous 12 mths Fall Risk  03/25/2015 03/20/2014 01/04/2013  Falls in the past year? No Yes No  Number falls in past yr: - 1 -  Injury with Fall? - No -   Depression screen - Negative screening  Depression screen [facility] 2/9 03/25/2015 03/20/2014 01/04/2013 01/04/2012  Decreased Interest 0 0 0 0  Down, Depressed, Hopeless 0 0 0 0  PHQ - 2 Score 0 0 0 0     Preventative: Flu shot - 11/11/14 Tetanus shot - discussed with PCP at previous visit Pneumonia shot - 03/20/14 Advanced directive discussion - Yes, advance directives have been completed

## 2015-03-28 NOTE — Telephone Encounter (Signed)
Patient's wife notified. She said his hands will turn white when they are cold and just wanted you to be aware. They will discuss it with you further at his follow up.

## 2015-04-05 NOTE — Progress Notes (Signed)
I reviewed health advisor's note, was available to answer questions, and agree with treatment plan.

## 2015-04-18 ENCOUNTER — Encounter: Payer: Self-pay | Admitting: Family Medicine

## 2015-04-18 ENCOUNTER — Ambulatory Visit (INDEPENDENT_AMBULATORY_CARE_PROVIDER_SITE_OTHER): Payer: Medicare Other | Admitting: Family Medicine

## 2015-04-18 VITALS — BP 120/60 | HR 75 | Temp 97.8°F | Wt 177.5 lb

## 2015-04-18 DIAGNOSIS — L858 Other specified epidermal thickening: Secondary | ICD-10-CM

## 2015-04-18 NOTE — Progress Notes (Signed)
   BP 120/60 mmHg  Pulse 75  Temp(Src) 97.8 F (36.6 C) (Oral)  Wt 177 lb 8 oz (80.513 kg)  SpO2 99%   CC: check skin lesion on face  Subjective:    Patient ID: [name], male    DOB: Mar 22, 1928, 82 y.o.   MRN: [id number]  HPI: [name] is a 82 y.o. male presenting on 04/18/2015 for skin tag   Skin growth that isn't painful. Has been treating with vaseline. No improvement. Present for months to 1 year.  Relevant past medical, surgical, family and social history reviewed and updated as indicated. Interim medical history since our last visit reviewed. Allergies and medications reviewed and updated. Current Outpatient Prescriptions on File Prior to Visit  Medication Sig  . acetaminophen (TYLENOL) 500 MG tablet Take 500 mg by mouth 2 (two) times daily as needed.  Marland Kitchen aspirin 325 MG tablet Take 325 mg by mouth daily.    Marland Kitchen atorvastatin (LIPITOR) 10 MG tablet Take 0.5 tablets (5 mg total) by mouth daily.  Marland Kitchen b complex vitamins capsule Take 1 capsule by mouth daily.  . cholecalciferol (VITAMIN D) 1000 UNITS tablet Take 1,000 Units by mouth daily.  Marland Kitchen docusate sodium (COLACE) 100 MG capsule Take 100 mg by mouth daily.  . dorzolamide-timolol (COSOPT) 22.3-6.8 MG/ML ophthalmic solution Place 1 drop into both eyes 2 (two) times daily.    . fexofenadine (ALLEGRA) 180 MG tablet Take 180 mg by mouth daily.    Marland Kitchen latanoprost (XALATAN) 0.005 % ophthalmic solution Place 1 drop into both eyes at bedtime.    Marland Kitchen losartan (COZAAR) 100 MG tablet Take one tablet daily  . Multiple Vitamins-Minerals (MENS MULTI VITAMIN & MINERAL) TABS Take 1 tablet by mouth daily.    . niacin (NIASPAN) 500 MG CR tablet Take one tablet at bedtime  . Polyvinyl Alcohol-Povidone (REFRESH OP) Apply to eye 2 (two) times daily.  . TRAVATAN Z 0.004 % SOLN ophthalmic solution    No current facility-administered medications on file prior to visit.    Review of Systems Per HPI unless specifically indicated in ROS section     Objective:    BP 120/60 mmHg  Pulse 75  Temp(Src) 97.8 F (36.6 C) (Oral)  Wt 177 lb 8 oz (80.513 kg)  SpO2 99%  Wt Readings from Last 3 Encounters:  04/18/15 177 lb 8 oz (80.513 kg)  03/25/15 177 lb (80.287 kg)  10/10/14 174 lb 3.2 oz (79.017 kg)    Physical Exam  Constitutional: He appears well-developed and well-nourished. No distress.  Skin: Skin is warm and dry. No rash noted.  Left cheek with small cutaenous horn <26mm diameter without surrounding erythema or induration  Nursing note and vitals reviewed.  Cryotherapy IC obtained and in chart. Liquid nitrogen was applied for 10 seconds to the skin lesion and the expected blistering or scabbing reaction explained. Do not pick at the area. Patient reminded could see hypopigmented scars from the procedure. Return if lesion fails to fully resolve.    Assessment & Plan:   Problem List Items Addressed This Visit    Cutaneous horn - Primary    Anticipate benign cutaneous horn - treat with LN2 therapy today.  Discussed aftercare, discussed red flags to seek care. Update if not fully resolved with treatment.          Follow up plan: No Follow-up on file.

## 2015-04-18 NOTE — Progress Notes (Signed)
Pre visit review using our clinic review tool, if applicable. No additional management support is needed unless otherwise documented below in the visit note. 

## 2015-04-18 NOTE — Assessment & Plan Note (Signed)
Anticipate benign cutaneous horn - treat with LN2 therapy today.  Discussed aftercare, discussed red flags to seek care. Update if not fully resolved with treatment.

## 2015-04-23 DIAGNOSIS — Z0000 Encounter for general adult medical examination without abnormal findings: Secondary | ICD-10-CM | POA: Diagnosis not present

## 2015-04-23 DIAGNOSIS — N402 Nodular prostate without lower urinary tract symptoms: Secondary | ICD-10-CM | POA: Diagnosis not present

## 2015-04-23 DIAGNOSIS — R351 Nocturia: Secondary | ICD-10-CM | POA: Diagnosis not present

## 2015-04-27 ENCOUNTER — Encounter: Payer: Self-pay | Admitting: Family Medicine

## 2015-04-27 DIAGNOSIS — N401 Enlarged prostate with lower urinary tract symptoms: Secondary | ICD-10-CM

## 2015-04-27 DIAGNOSIS — N138 Other obstructive and reflux uropathy: Secondary | ICD-10-CM | POA: Insufficient documentation

## 2015-05-02 ENCOUNTER — Other Ambulatory Visit: Payer: Self-pay | Admitting: *Deleted

## 2015-05-02 MED ORDER — NIACIN ER (ANTIHYPERLIPIDEMIC) 500 MG PO TBCR: EXTENDED_RELEASE_TABLET | ORAL | Status: DC

## 2015-06-26 ENCOUNTER — Other Ambulatory Visit: Payer: Self-pay

## 2015-06-26 MED ORDER — ATORVASTATIN CALCIUM 10 MG PO TABS: 5.0000 mg | ORAL_TABLET | Freq: Every day | ORAL | Status: DC

## 2015-06-26 NOTE — Telephone Encounter (Signed)
Rx sent electronically.  

## 2015-09-25 DIAGNOSIS — H401131 Primary open-angle glaucoma, bilateral, mild stage: Secondary | ICD-10-CM | POA: Diagnosis not present

## 2015-11-12 ENCOUNTER — Ambulatory Visit (INDEPENDENT_AMBULATORY_CARE_PROVIDER_SITE_OTHER): Payer: Medicare Other

## 2015-11-12 DIAGNOSIS — Z23 Encounter for immunization: Secondary | ICD-10-CM

## 2015-11-12 NOTE — Progress Notes (Signed)
Pt was in today with spouse for AWV. Pt requested flu shot.

## 2015-11-12 NOTE — Progress Notes (Signed)
Pre visit review using our clinic review tool, if applicable. No additional management support is needed unless otherwise documented below in the visit note. 

## 2016-01-29 DIAGNOSIS — H401133 Primary open-angle glaucoma, bilateral, severe stage: Secondary | ICD-10-CM | POA: Diagnosis not present

## 2016-03-09 ENCOUNTER — Other Ambulatory Visit: Payer: Self-pay | Admitting: *Deleted

## 2016-03-09 DIAGNOSIS — I10 Essential (primary) hypertension: Secondary | ICD-10-CM

## 2016-03-09 MED ORDER — LOSARTAN POTASSIUM 100 MG PO TABS: ORAL_TABLET | ORAL | 0 refills | Status: DC

## 2016-03-31 ENCOUNTER — Other Ambulatory Visit: Payer: Self-pay | Admitting: Family Medicine

## 2016-03-31 DIAGNOSIS — E785 Hyperlipidemia, unspecified: Secondary | ICD-10-CM

## 2016-04-01 ENCOUNTER — Ambulatory Visit (INDEPENDENT_AMBULATORY_CARE_PROVIDER_SITE_OTHER): Payer: Medicare Other

## 2016-04-01 VITALS — BP 118/68 | HR 75 | Temp 98.5°F | Ht 66.75 in | Wt 178.5 lb

## 2016-04-01 DIAGNOSIS — Z0000 Encounter for general adult medical examination without abnormal findings: Secondary | ICD-10-CM

## 2016-04-01 DIAGNOSIS — E785 Hyperlipidemia, unspecified: Secondary | ICD-10-CM | POA: Diagnosis not present

## 2016-04-01 LAB — LIPID PANEL
CHOL/HDL RATIO: 3
Cholesterol: 162 mg/dL (ref 0–200)
HDL: 56.2 mg/dL (ref >39.00)
LDL Cholesterol: 87 mg/dL (ref 0–99)
NONHDL: 105.47
Triglycerides: 91 mg/dL (ref 0.0–149.0)
VLDL: 18.2 mg/dL (ref 0.0–40.0)

## 2016-04-01 LAB — COMPREHENSIVE METABOLIC PANEL
ALK PHOS: 44 U/L (ref 39–117)
ALT: 21 U/L (ref 0–53)
AST: 21 U/L (ref 0–37)
Albumin: 4.2 g/dL (ref 3.5–5.2)
BUN: 17 mg/dL (ref 6–23)
CHLORIDE: 105 meq/L (ref 96–112)
CO2: 31 mEq/L (ref 19–32)
Calcium: 9.6 mg/dL (ref 8.4–10.5)
Creatinine, Ser: 0.83 mg/dL (ref 0.40–1.50)
GFR: 93.39 mL/min (ref >60.00)
GLUCOSE: 94 mg/dL (ref 70–99)
POTASSIUM: 4.4 meq/L (ref 3.5–5.1)
SODIUM: 139 meq/L (ref 135–145)
Total Bilirubin: 0.7 mg/dL (ref 0.2–1.2)
Total Protein: 7.2 g/dL (ref 6.0–8.3)

## 2016-04-01 NOTE — Progress Notes (Signed)
PCP notes:   Health maintenance:  Tetanus - addressed; pt will discuss with PCP at next appt  Abnormal screenings:   Mini-Cog score: 16/20  Patient concerns:   None  Nurse concerns:  None  Next PCP appt:   04/08/16 @ 1130

## 2016-04-01 NOTE — Patient Instructions (Signed)
[name] , Thank you for taking time to come for your Medicare Wellness Visit. I appreciate your ongoing commitment to your health goals. Please review the following plan we discussed and let me know if I can assist you in the future.   These are the goals we discussed: Goals    . Increase physical activity          Starting 04/01/2016, I will continue to walk on treadmill for 30 min 2 days per week.     . Increase water intake (pt-stated)          Beginning 03/25/15, patient will begin drinking at least 8 oz water per day.        This is a list of the screening recommended for you and due dates:  Health Maintenance  Topic Date Due  . DTaP/Tdap/Td vaccine (1 - Tdap) 11/11/2016*  . Tetanus Vaccine  11/11/2016*  . Flu Shot  Completed  . Shingles Vaccine  Completed  . Pneumonia vaccines  Completed  *Topic was postponed. The date shown is not the original due date.   Preventive Care for Adults  A healthy lifestyle and preventive care can promote health and wellness. Preventive health guidelines for adults include the following key practices.  . A routine yearly physical is a good way to check with your health care provider about your health and preventive screening. It is a chance to share any concerns and updates on your health and to receive a thorough exam.  . Visit your dentist for a routine exam and preventive care every 6 months. Brush your teeth twice a day and floss once a day. Good oral hygiene prevents tooth decay and gum disease.  . The frequency of eye exams is based on your age, health, family medical history, use  of contact lenses, and other factors. Follow your health care provider's ecommendations for frequency of eye exams.  . Eat a healthy diet. Foods like vegetables, fruits, whole grains, low-fat dairy products, and lean protein foods contain the nutrients you need without too many calories. Decrease your intake of foods high in solid fats, added sugars, and salt.  Eat the right amount of calories for you. Get information about a proper diet from your health care provider, if necessary.  . Regular physical exercise is one of the most important things you can do for your health. Most adults should get at least 150 minutes of moderate-intensity exercise (any activity that increases your heart rate and causes you to sweat) each week. In addition, most adults need muscle-strengthening exercises on 2 or more days a week.  Silver Sneakers may be a benefit available to you. To determine eligibility, you may visit the website: www.silversneakers.com or contact program at (318)003-9799 Mon-Fri between 8AM-8PM.   . Maintain a healthy weight. The body mass index (BMI) is a screening tool to identify possible weight problems. It provides an estimate of body fat based on height and weight. Your health care provider can find your BMI and can help you achieve or maintain a healthy weight.   For adults 20 years and older: ? A BMI below 18.5 is considered underweight. ? A BMI of 18.5 to 24.9 is normal. ? A BMI of 25 to 29.9 is considered overweight. ? A BMI of 30 and above is considered obese.   . Maintain normal blood lipids and cholesterol levels by exercising and minimizing your intake of saturated fat. Eat a balanced diet with plenty of fruit and vegetables. Blood  tests for lipids and cholesterol should begin at age 4 and be repeated every 5 years. If your lipid or cholesterol levels are high, you are over 50, or you are at high risk for heart disease, you may need your cholesterol levels checked more frequently. Ongoing high lipid and cholesterol levels should be treated with medicines if diet and exercise are not working.  . If you smoke, find out from your health care provider how to quit. If you do not use tobacco, please do not start.  . If you choose to drink alcohol, please do not consume more than 2 drinks per day. One drink is considered to be 12 ounces (355  mL) of beer, 5 ounces (148 mL) of wine, or 1.5 ounces (44 mL) of liquor.  . If you are 56-34 years old, ask your health care provider if you should take aspirin to prevent strokes.  . Use sunscreen. Apply sunscreen liberally and repeatedly throughout the day. You should seek shade when your shadow is shorter than you. Protect yourself by wearing long sleeves, pants, a wide-brimmed hat, and sunglasses year round, whenever you are outdoors.  . Once a month, do a whole body skin exam, using a mirror to look at the skin on your back. Tell your health care provider of new moles, moles that have irregular borders, moles that are larger than a pencil eraser, or moles that have changed in shape or color.

## 2016-04-01 NOTE — Progress Notes (Signed)
I reviewed health advisor's note, was available for consultation, and agree with documentation and plan.  

## 2016-04-01 NOTE — Progress Notes (Signed)
Subjective:   [name] is a 83 y.o. male who presents for Medicare Annual/Subsequent preventive examination.  Review of Systems:  N/A Cardiac Risk Factors include: advanced age (>69men, >6 women);male gender;hypertension;dyslipidemia     Objective:    Vitals: BP 118/68 (BP Location: Right Arm, Patient Position: Sitting, Cuff Size: Normal)   Pulse 75   Temp 98.5 F (36.9 C) (Oral)   Ht 5' 6.75" (1.695 m) Comment: no shoes  Wt 178 lb 8 oz (81 kg)   SpO2 99%   BMI 28.17 kg/m   Body mass index is 28.17 kg/m.  Tobacco History  Smoking Status  . Former Smoker  . Types: Cigarettes  Smokeless Tobacco  . Never Used     Counseling given: No   Past Medical History:  Diagnosis Date  . Allergic rhinitis   . BPH (benign prostatic hypertrophy) with urinary obstruction 2000   40gm, L lobe 34mm prostate nodule; followed yearly by Dr Diona Fanti  . GERD (gastroesophageal reflux disease)   . Glaucoma   . Herpes zoster ophthalmicus    history  . History of kidney stones remote   x 1-Ca monhydrate, hyperoxalate urine s/p lithotripsy  . HLD (hyperlipidemia)   . HTN (hypertension)   . Impaired glucose tolerance   . Impaired hearing    uses hearing aides bilaterally  . Osteopenia    Past Surgical History:  Procedure Laterality Date  . APPENDECTOMY  1997  . CATARACT EXTRACTION Right 11/1013   Dr. Venetia Maxon  . COLONOSCOPY  2002   [facility]; repeat 2012  . COLONOSCOPY  02/2012   tubular adenoma Deatra Ina)  . [place]   left-1955; right-1970  . LITHOTRIPSY  2005  . SBO  2008, 2010   Family History  Problem Relation Age of Onset  . Coronary artery disease Father   . Healthy Mother   . Diabetes Brother   . Breast cancer Sister   . Leukemia Maternal Uncle   . Pancreatic cancer Sister    History  Sexual Activity  . Sexual activity: No    Outpatient Encounter Prescriptions as of 04/01/2016  Medication Sig  . acetaminophen (TYLENOL) 500 MG  tablet Take 500 mg by mouth 2 (two) times daily as needed.  Marland Kitchen aspirin 325 MG tablet Take 325 mg by mouth daily.    Marland Kitchen atorvastatin (LIPITOR) 10 MG tablet Take 0.5 tablets (5 mg total) by mouth daily.  Marland Kitchen b complex vitamins capsule Take 1 capsule by mouth daily.  . cholecalciferol (VITAMIN D) 1000 UNITS tablet Take 1,000 Units by mouth daily.  Marland Kitchen docusate sodium (COLACE) 100 MG capsule Take 100 mg by mouth daily.  . dorzolamide-timolol (COSOPT) 22.3-6.8 MG/ML ophthalmic solution Place 1 drop into both eyes 2 (two) times daily.    . fexofenadine (ALLEGRA) 180 MG tablet Take 180 mg by mouth daily.    Marland Kitchen latanoprost (XALATAN) 0.005 % ophthalmic solution Place 1 drop into both eyes at bedtime.    Marland Kitchen losartan (COZAAR) 100 MG tablet Take one tablet daily  . Multiple Vitamins-Minerals (MENS MULTI VITAMIN & MINERAL) TABS Take 1 tablet by mouth daily.    . niacin (NIASPAN) 500 MG CR tablet Take one tablet at bedtime  . Polyvinyl Alcohol-Povidone (REFRESH OP) Apply to eye 2 (two) times daily.  Dorette Grate Z 0.004 % SOLN ophthalmic solution    No facility-administered encounter medications on file as of 04/01/2016.     Activities of Daily Living In your present state of  health, do you have any difficulty performing the following activities: 04/01/2016  Hearing? Y  Vision? N  Difficulty concentrating or making decisions? Y  Walking or climbing stairs? N  Dressing or bathing? N  Doing errands, shopping? N  Preparing Food and eating ? N  Using the Toilet? N  In the past six months, have you accidently leaked urine? N  Do you have problems with loss of bowel control? N  Managing your Medications? N  Managing your Finances? N  Housekeeping or managing your Housekeeping? N  Some recent data might be hidden    Patient Care Team: [name], MD as PCP - General (Family Medicine) Marylynn Pearson, MD as Consulting Physician (Ophthalmology) Franchot Gallo, MD as Consulting Physician (Urology) Lavella Hammock, DDS as Consulting Physician (Dentistry)   Assessment:    Hearing Screening Comments: Bilateral hearing aids Vision Screening Comments: Last vision exam in Nov 2017  Exercise Activities and Dietary recommendations Current Exercise Habits: Home exercise routine, Type of exercise: treadmill, Time (Minutes): 30, Frequency (Times/Week): 2, Weekly Exercise (Minutes/Week): 60, Intensity: Mild, Exercise limited by: None identified  Goals    . Increase physical activity          Starting 04/01/2016, I will continue to walk on treadmill for 30 min 2 days per week.     . Increase water intake (pt-stated)          Beginning 03/25/15, patient will begin drinking at least 8 oz water per day.       Fall Risk Fall Risk  04/01/2016 03/25/2015 03/20/2014 01/04/2013  Falls in the past year? No No Yes No  Number falls in past yr: - - 1 -  Injury with Fall? - - No -   Depression Screen PHQ 2/9 Scores 04/01/2016 03/25/2015 03/20/2014 01/04/2013  PHQ - 2 Score 0 0 0 0    Cognitive Function MMSE - Mini Mental State Exam 04/01/2016 03/25/2015  Orientation to time 5 5  Orientation to Place 5 5  Registration 3 -  Attention/ Calculation 0 5  Recall 0 0  Recall-comments pt was unable to recall 3 of 3 words -  Language- name 2 objects 0 -  Language- repeat 1 1  Language- follow 3 step command 2 -  Language- follow 3 step command-comments pt was unable to follow 1 of 3 step command -  Language- read & follow direction 0 -  Write a sentence 0 -  Copy design 0 -  Total score 16 -       PLEASE NOTE: A Mini-Cog screen was completed. Maximum score is 20. A value of 0 denotes this part of Folstein MMSE was not completed or the patient failed this part of the Mini-Cog screening.   Mini-Cog Screening Orientation to Time - Max 5 pts Orientation to Place - Max 5 pts Registration - Max 3 pts Recall - Max 3 pts Language Repeat - Max 1 pts Language Follow 3 Step Command - Max 3 pts'  Immunization History    Administered Date(s) Administered  . Influenza Whole 12/06/2009, 11/29/2011  . Influenza,inj,Quad PF,36+ Mos 11/02/2012, 11/05/2013, 11/11/2014, 11/12/2015  . Pneumococcal Conjugate-13 03/20/2014  . Pneumococcal Polysaccharide-23 07/23/2005  . Zoster 10/01/2010   Screening Tests Health Maintenance  Topic Date Due  . DTaP/Tdap/Td (1 - Tdap) 11/11/2016 (Originally 05/09/1947)  . TETANUS/TDAP  11/11/2016 (Originally 02/23/2012)  . INFLUENZA VACCINE  Completed  . ZOSTAVAX  Completed  . PNA vac Low Risk Adult  Completed  Plan:     I have personally reviewed and addressed the Medicare Annual Wellness questionnaire and have noted the following in the patient's chart:  A. Medical and social history B. Use of alcohol, tobacco or illicit drugs  C. Current medications and supplements D. Functional ability and status E.  Nutritional status F.  Physical activity G. Advance directives H. List of other physicians I.  Hospitalizations, surgeries, and ER visits in previous 12 months J.  [place] to include hearing, vision, cognitive, depression L. Referrals and appointments - none  In addition, I have reviewed and discussed with patient certain preventive protocols, quality metrics, and best practice recommendations. A written personalized care plan for preventive services as well as general preventive health recommendations were provided to patient.  See attached scanned questionnaire for additional information.   Signed,   Lindell Noe, MHA, BS, LPN Health Coach

## 2016-04-01 NOTE — Progress Notes (Signed)
Pre visit review using our clinic review tool, if applicable. No additional management support is needed unless otherwise documented below in the visit note. 

## 2016-04-08 ENCOUNTER — Ambulatory Visit (INDEPENDENT_AMBULATORY_CARE_PROVIDER_SITE_OTHER): Payer: Medicare Other | Admitting: Family Medicine

## 2016-04-08 ENCOUNTER — Encounter: Payer: Self-pay | Admitting: Family Medicine

## 2016-04-08 VITALS — BP 122/76 | HR 72 | Temp 98.1°F | Wt 177.2 lb

## 2016-04-08 DIAGNOSIS — E785 Hyperlipidemia, unspecified: Secondary | ICD-10-CM | POA: Diagnosis not present

## 2016-04-08 DIAGNOSIS — Z7189 Other specified counseling: Secondary | ICD-10-CM | POA: Diagnosis not present

## 2016-04-08 DIAGNOSIS — R413 Other amnesia: Secondary | ICD-10-CM | POA: Diagnosis not present

## 2016-04-08 DIAGNOSIS — H409 Unspecified glaucoma: Secondary | ICD-10-CM

## 2016-04-08 DIAGNOSIS — I10 Essential (primary) hypertension: Secondary | ICD-10-CM

## 2016-04-08 NOTE — Assessment & Plan Note (Signed)
Living will - have at home, living will in chart (02/2013) but no formal HCPOA form. children know, 2 children are co-HC POA and co-beneficiaries. Would desire temporary life support but not prolonged. Asked to bring me copy.

## 2016-04-08 NOTE — Progress Notes (Signed)
Pre visit review using our clinic review tool, if applicable. No additional management support is needed unless otherwise documented below in the visit note. 

## 2016-04-08 NOTE — Progress Notes (Signed)
BP 122/76   Pulse 72   Temp 98.1 F (36.7 C) (Oral)   Wt 177 lb 4 oz (80.4 kg)   BMI 27.97 kg/m    CC: AMW f/u visit Subjective:    Patient ID: [name], male    DOB: 1928/04/28, 83 y.o.   MRN: [id number]  HPI: IVERY HOVSEPIAN is a 83 y.o. male presenting on 04/08/2016 for Annual Exam   Saw Katha Cabal last week for medicare wellness visit. Note reviewed.  Mini cog score 16/20. Not interested in memory medication.   Wife with flu like illness last month.   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 (Dr Deatra Ina). No fmhx colon cancer. Age out.  Prostate - h/o spot on prostate. Checked by Dr Diona Fanti yearly. No fmhx prostate cancer.  Flu shot yearly Pneumovax 2007. prevnar - 2016 Tetanus 2004.  Shingles - 2012.  Living will - have at home, living will in chart (02/2013) but no formal HCPOA form. children know, 2 children are co-HC POA and co-beneficiaries. Would desire temporary life support but not prolonged. Asked to bring me copy.  Seat belt use discussed Sunscreen use discussed. No changing moles on skin. Non smoker Alcohol - none  Minimal caffeine Lives with wife, [name]. No pets Nutritional therapist; working for [employer], [employer] currently; retired from [employer]: 12th grade education Activity: stays active outside Diet: good water, fruits/vegetables daily  Relevant past medical, surgical, family and social history reviewed and updated as indicated. Interim medical history since our last visit reviewed. Allergies and medications reviewed and updated. Current Outpatient Prescriptions on File Prior to Visit  Medication Sig  . acetaminophen (TYLENOL) 500 MG tablet Take 500 mg by mouth 2 (two) times daily as needed.  Marland Kitchen aspirin 325 MG tablet Take 325 mg by mouth daily.    Marland Kitchen atorvastatin (LIPITOR) 10 MG tablet Take 0.5 tablets (5 mg total) by mouth daily.  Marland Kitchen b complex vitamins capsule Take 1 capsule by mouth daily.  . cholecalciferol (VITAMIN D) 1000 UNITS tablet Take 1,000  Units by mouth daily.  Marland Kitchen docusate sodium (COLACE) 100 MG capsule Take 100 mg by mouth daily.  . dorzolamide-timolol (COSOPT) 22.3-6.8 MG/ML ophthalmic solution Place 1 drop into both eyes 2 (two) times daily.    . fexofenadine (ALLEGRA) 180 MG tablet Take 180 mg by mouth daily.    Marland Kitchen latanoprost (XALATAN) 0.005 % ophthalmic solution Place 1 drop into both eyes at bedtime.    Marland Kitchen losartan (COZAAR) 100 MG tablet Take one tablet daily  . Multiple Vitamins-Minerals (MENS MULTI VITAMIN & MINERAL) TABS Take 1 tablet by mouth daily.    . niacin (NIASPAN) 500 MG CR tablet Take one tablet at bedtime  . Polyvinyl Alcohol-Povidone (REFRESH OP) Apply to eye 2 (two) times daily.  . TRAVATAN Z 0.004 % SOLN ophthalmic solution    No current facility-administered medications on file prior to visit.     Review of Systems Per HPI unless specifically indicated in ROS section     Objective:    BP 122/76   Pulse 72   Temp 98.1 F (36.7 C) (Oral)   Wt 177 lb 4 oz (80.4 kg)   BMI 27.97 kg/m   Wt Readings from Last 3 Encounters:  04/08/16 177 lb 4 oz (80.4 kg)  04/01/16 178 lb 8 oz (81 kg)  04/18/15 177 lb 8 oz (80.5 kg)    Physical Exam  Constitutional: He is oriented to person, place, and time. He appears well-developed and well-nourished. No  distress.  HENT:  Head: Normocephalic and atraumatic.  Right Ear: Decreased hearing is noted.  Left Ear: Decreased hearing is noted.  Nose: Nose normal.  Mouth/Throat: Uvula is midline, oropharynx is clear and moist and mucous membranes are normal. No oropharyngeal exudate, posterior oropharyngeal edema or posterior oropharyngeal erythema.  Aides in place  Eyes: Conjunctivae and EOM are normal. Pupils are equal, round, and reactive to light. No scleral icterus.  Neck: Normal range of motion. Neck supple.  Cardiovascular: Normal rate, regular rhythm, normal heart sounds and intact distal pulses.   No murmur heard. Pulses:      Radial pulses are 2+ on the  right side, and 2+ on the left side.  Pulmonary/Chest: Effort normal and breath sounds normal. No respiratory distress. He has no wheezes. He has no rales.  Abdominal: Soft. Bowel sounds are normal. He exhibits no distension and no mass. There is no tenderness. There is no rebound and no guarding.  Musculoskeletal: Normal range of motion. He exhibits no edema.  Lymphadenopathy:    He has no cervical adenopathy.  Neurological: He is alert and oriented to person, place, and time.  CN grossly intact, station and gait intact  Skin: Skin is warm and dry. No rash noted.  Psychiatric: He has a normal mood and affect. His behavior is normal. Judgment and thought content normal.  Nursing note and vitals reviewed.  Results for orders placed or performed in visit on 04/01/16  Lipid panel  Result Value Ref Range   Cholesterol 162 0 - 200 mg/dL   Triglycerides 91.0 0.0 - 149.0 mg/dL   HDL 56.20 >39.00 mg/dL   VLDL 18.2 0.0 - 40.0 mg/dL   LDL Cholesterol 87 0 - 99 mg/dL   Total CHOL/HDL Ratio 3    NonHDL 105.47   Comprehensive metabolic panel  Result Value Ref Range   Sodium 139 135 - 145 mEq/L   Potassium 4.4 3.5 - 5.1 mEq/L   Chloride 105 96 - 112 mEq/L   CO2 31 19 - 32 mEq/L   Glucose, Bld 94 70 - 99 mg/dL   BUN 17 6 - 23 mg/dL   Creatinine, Ser 0.83 0.40 - 1.50 mg/dL   Total Bilirubin 0.7 0.2 - 1.2 mg/dL   Alkaline Phosphatase 44 39 - 117 U/L   AST 21 0 - 37 U/L   ALT 21 0 - 53 U/L   Total Protein 7.2 6.0 - 8.3 g/dL   Albumin 4.2 3.5 - 5.2 g/dL   Calcium 9.6 8.4 - 10.5 mg/dL   GFR 93.39 >60.00 mL/min      Assessment & Plan:   Problem List Items Addressed This Visit    Advanced care planning/counseling discussion    Living will - have at home, living will in chart (02/2013) but no formal HCPOA form. children know, 2 children are co-HC POA and co-beneficiaries. Would desire temporary life support but not prolonged. Asked to bring me copy.       Dyslipidemia    Chronic, stable.  Continue current regimen.       Essential hypertension    Chronic, stable. Continue losartan 100mg  daily.       Glaucoma    Regularly sees ophtho.      Memory deficit - Primary    Discussed concerns with patient and wife - failed minicog with [name]. Will repeat eval with MMSE next visit.  Continues to drive. Discussed driving autonomy vs driving safety in setting of hearing loss.  Follow up plan: Return in about 1 year (around 04/08/2017) for medicare wellness visit.  [name], MD

## 2016-04-08 NOTE — Assessment & Plan Note (Signed)
Regularly sees ophtho.  

## 2016-04-08 NOTE — Assessment & Plan Note (Signed)
Chronic, stable.  Continue losartan 100 mg daily 

## 2016-04-08 NOTE — Patient Instructions (Addendum)
Good to see you today. Call us with questions.  Bring [place] copy of your advanced directives with health care power of attorney form to update your chart.  You are doing well today. Return as needed or in 1 year for next wellness visit  Health Maintenance, Male A healthy lifestyle and preventative care can promote health and wellness.  Maintain regular health, dental, and eye exams.  Eat a healthy diet. Foods like vegetables, fruits, whole grains, low-fat dairy products, and lean protein foods contain the nutrients you need and are low in calories. Decrease your intake of foods high in solid fats, added sugars, and salt. Get information about a proper diet from your health care provider, if necessary.  Regular physical exercise is one of the most important things you can do for your health. Most adults should get at least 150 minutes of moderate-intensity exercise (any activity that increases your heart rate and causes you to sweat) each week. In addition, most adults need muscle-strengthening exercises on 2 or more days a week.   Maintain a healthy weight. The body mass index (BMI) is a screening tool to identify possible weight problems. It provides an estimate of body fat based on height and weight. Your health care provider can find your BMI and can help you achieve or maintain a healthy weight. For males 20 years and older:  A BMI below 18.5 is considered underweight.  A BMI of 18.5 to 24.9 is normal.  A BMI of 25 to 29.9 is considered overweight.  A BMI of 30 and above is considered obese.  Maintain normal blood lipids and cholesterol by exercising and minimizing your intake of saturated fat. Eat a balanced diet with plenty of fruits and vegetables. Blood tests for lipids and cholesterol should begin at age 51 and be repeated every 5 years. If your lipid or cholesterol levels are high, you are over age 84, or you are at high risk for heart disease, you may need your cholesterol levels  checked more frequently.Ongoing high lipid and cholesterol levels should be treated with medicines if diet and exercise are not working.  If you smoke, find out from your health care provider how to quit. If you do not use tobacco, do not start.  Lung cancer screening is recommended for adults aged 44-80 years who are at high risk for developing lung cancer because of a history of smoking. A yearly low-dose CT scan of the lungs is recommended for people who have at least a 30-pack-year history of smoking and are current smokers or have quit within the past 15 years. A pack year of smoking is smoking an average of 1 pack of cigarettes a day for 1 year (for example, a 30-pack-year history of smoking could mean smoking 1 pack a day for 30 years or 2 packs a day for 15 years). Yearly screening should continue until the smoker has stopped smoking for at least 15 years. Yearly screening should be stopped for people who develop a health problem that would prevent them from having lung cancer treatment.  If you choose to drink alcohol, do not have more than 2 drinks per day. One drink is considered to be 12 oz (360 mL) of beer, 5 oz (150 mL) of wine, or 1.5 oz (45 mL) of liquor.  Avoid the use of street drugs. Do not share needles with anyone. Ask for help if you need support or instructions about stopping the use of drugs.  High blood pressure causes heart  disease and increases the risk of stroke. High blood pressure is more likely to develop in:  People who have blood pressure in the end of the normal range (100-139/85-89 mm Hg).  People who are overweight or obese.  People who are African American.  If you are 13-1 years of age, have your blood pressure checked every 3-5 years. If you are 46 years of age or older, have your blood pressure checked every year. You should have your blood pressure measured twice-once when you are at a hospital or clinic, and once when you are not at a hospital or clinic.  Record the average of the two measurements. To check your blood pressure when you are not at a hospital or clinic, you can use:  An automated blood pressure machine at a pharmacy.  A home blood pressure monitor.  If you are 27-6 years old, ask your health care provider if you should take aspirin to prevent heart disease.  Diabetes screening involves taking a blood sample to check your fasting blood sugar level. This should be done once every 3 years after age 69 if you are at a normal weight and without risk factors for diabetes. Testing should be considered at a younger age or be carried out more frequently if you are overweight and have at least 1 risk factor for diabetes.  Colorectal cancer can be detected and often prevented. Most routine colorectal cancer screening begins at the age of 102 and continues through age 96. However, your health care provider may recommend screening at an earlier age if you have risk factors for colon cancer. On a yearly basis, your health care provider may provide home test kits to check for hidden blood in the stool. A small camera at the end of a tube may be used to directly examine the colon (sigmoidoscopy or colonoscopy) to detect the earliest forms of colorectal cancer. Talk to your health care provider about this at age 23 when routine screening begins. A direct exam of the colon should be repeated every 5-10 years through age 46, unless early forms of precancerous polyps or small growths are found.  People who are at an increased risk for hepatitis B should be screened for this virus. You are considered at high risk for hepatitis B if:  You were born in a country where hepatitis B occurs often. Talk with your health care provider about which countries are considered high risk.  Your parents were born in a high-risk country and you have not received a shot to protect against hepatitis B (hepatitis B vaccine).  You have HIV or AIDS.  You use needles to  inject street drugs.  You live with, or have sex with, someone who has hepatitis B.  You are a man who has sex with other men (MSM).  You get hemodialysis treatment.  You take certain medicines for conditions like cancer, organ transplantation, and autoimmune conditions.  Hepatitis C blood testing is recommended for all people born from [birth year] through 1963 and any individual with known risk factors for hepatitis C.  Healthy men should no longer receive prostate-specific antigen (PSA) blood tests as part of routine cancer screening. Talk to your health care provider about prostate cancer screening.  Testicular cancer screening is not recommended for adolescents or adult males who have no symptoms. Screening includes self-exam, a health care provider exam, and other screening tests. Consult with your health care provider about any symptoms you have or any concerns you have about testicular  cancer.  Practice safe sex. Use condoms and avoid high-risk sexual practices to reduce the spread of sexually transmitted infections (STIs).  You should be screened for STIs, including gonorrhea and chlamydia if:  You are sexually active and are younger than 24 years.  You are older than 24 years, and your health care provider tells you that you are at risk for this type of infection.  Your sexual activity has changed since you were last screened, and you are at an increased risk for chlamydia or gonorrhea. Ask your health care provider if you are at risk.  If you are at risk of being infected with HIV, it is recommended that you take a prescription medicine daily to prevent HIV infection. This is called pre-exposure prophylaxis (PrEP). You are considered at risk if:  You are a man who has sex with other men (MSM).  You are a heterosexual man who is sexually active with multiple partners.  You take drugs by injection.  You are sexually active with a partner who has HIV.  Talk with your health care  provider about whether you are at high risk of being infected with HIV. If you choose to begin PrEP, you should first be tested for HIV. You should then be tested every 3 months for as long as you are taking PrEP.  Use sunscreen. Apply sunscreen liberally and repeatedly throughout the day. You should seek shade when your shadow is shorter than you. Protect yourself by wearing long sleeves, pants, a wide-brimmed hat, and sunglasses year round whenever you are outdoors.  Tell your health care provider of new moles or changes in moles, especially if there is a change in shape or color. Also, tell your health care provider if a mole is larger than the size of a pencil eraser.  A one-time screening for abdominal aortic aneurysm (AAA) and surgical repair of large AAAs by ultrasound is recommended for men aged 25-75 years who are current or former smokers.  Stay current with your vaccines (immunizations). This information is not intended to replace advice given to you by your health care provider. Make sure you discuss any questions you have with your health care provider. Document Released: 08/07/2007 Document Revised: 03/01/2014 Document Reviewed: 11/12/2014 Elsevier Interactive Patient Education  2017 [employer].

## 2016-04-08 NOTE — Assessment & Plan Note (Signed)
Discussed concerns with patient and wife - failed minicog with [place]. Will repeat eval with MMSE next visit.  Continues to drive. Discussed driving autonomy vs driving safety in setting of hearing loss.

## 2016-04-08 NOTE — Assessment & Plan Note (Signed)
Chronic, stable. Continue current regimen. 

## 2016-04-09 ENCOUNTER — Other Ambulatory Visit: Payer: Self-pay

## 2016-04-09 DIAGNOSIS — I10 Essential (primary) hypertension: Secondary | ICD-10-CM

## 2016-04-09 MED ORDER — LOSARTAN POTASSIUM 100 MG PO TABS: ORAL_TABLET | ORAL | 11 refills | Status: DC

## 2016-04-09 NOTE — Telephone Encounter (Signed)
[facility] request refill losartan. Annual 04/08/16. Refill done per protocol.

## 2016-04-30 ENCOUNTER — Other Ambulatory Visit: Payer: Self-pay | Admitting: *Deleted

## 2016-04-30 DIAGNOSIS — I10 Essential (primary) hypertension: Secondary | ICD-10-CM

## 2016-04-30 MED ORDER — LOSARTAN POTASSIUM 100 MG PO TABS: ORAL_TABLET | ORAL | 2 refills | Status: DC

## 2016-06-03 DIAGNOSIS — H2512 Age-related nuclear cataract, left eye: Secondary | ICD-10-CM | POA: Diagnosis not present

## 2016-06-03 DIAGNOSIS — H401133 Primary open-angle glaucoma, bilateral, severe stage: Secondary | ICD-10-CM | POA: Diagnosis not present

## 2016-08-16 DIAGNOSIS — N200 Calculus of kidney: Secondary | ICD-10-CM | POA: Diagnosis not present

## 2016-08-16 DIAGNOSIS — N402 Nodular prostate without lower urinary tract symptoms: Secondary | ICD-10-CM | POA: Diagnosis not present

## 2016-09-02 DIAGNOSIS — H401133 Primary open-angle glaucoma, bilateral, severe stage: Secondary | ICD-10-CM | POA: Diagnosis not present

## 2016-09-02 DIAGNOSIS — H43813 Vitreous degeneration, bilateral: Secondary | ICD-10-CM | POA: Diagnosis not present

## 2016-09-02 DIAGNOSIS — H2512 Age-related nuclear cataract, left eye: Secondary | ICD-10-CM | POA: Diagnosis not present

## 2016-09-07 ENCOUNTER — Other Ambulatory Visit: Payer: Self-pay | Admitting: Family Medicine

## 2017-02-01 ENCOUNTER — Other Ambulatory Visit: Payer: Self-pay | Admitting: Family Medicine

## 2017-02-01 DIAGNOSIS — I10 Essential (primary) hypertension: Secondary | ICD-10-CM

## 2017-02-28 ENCOUNTER — Other Ambulatory Visit: Payer: Self-pay | Admitting: Family Medicine

## 2017-04-06 ENCOUNTER — Other Ambulatory Visit: Payer: Self-pay | Admitting: Family Medicine

## 2017-04-06 DIAGNOSIS — E785 Hyperlipidemia, unspecified: Secondary | ICD-10-CM

## 2017-04-07 DIAGNOSIS — H401133 Primary open-angle glaucoma, bilateral, severe stage: Secondary | ICD-10-CM | POA: Diagnosis not present

## 2017-04-07 DIAGNOSIS — H2512 Age-related nuclear cataract, left eye: Secondary | ICD-10-CM | POA: Diagnosis not present

## 2017-04-11 ENCOUNTER — Ambulatory Visit: Payer: Medicare Other

## 2017-04-15 ENCOUNTER — Ambulatory Visit (INDEPENDENT_AMBULATORY_CARE_PROVIDER_SITE_OTHER): Payer: Medicare Other

## 2017-04-15 VITALS — BP 118/78 | HR 72 | Temp 98.8°F | Ht 66.75 in | Wt 177.0 lb

## 2017-04-15 DIAGNOSIS — E785 Hyperlipidemia, unspecified: Secondary | ICD-10-CM

## 2017-04-15 DIAGNOSIS — Z0000 Encounter for general adult medical examination without abnormal findings: Secondary | ICD-10-CM | POA: Diagnosis not present

## 2017-04-15 LAB — COMPREHENSIVE METABOLIC PANEL
ALBUMIN: 4 g/dL (ref 3.5–5.2)
ALK PHOS: 44 U/L (ref 39–117)
ALT: 18 U/L (ref 0–53)
AST: 19 U/L (ref 0–37)
BUN: 27 mg/dL — AB (ref 6–23)
CALCIUM: 9.7 mg/dL (ref 8.4–10.5)
CO2: 27 mEq/L (ref 19–32)
CREATININE: 0.83 mg/dL (ref 0.40–1.50)
Chloride: 104 mEq/L (ref 96–112)
GFR: 93.16 mL/min (ref >60.00)
Glucose, Bld: 103 mg/dL — ABNORMAL HIGH (ref 70–99)
POTASSIUM: 4.4 meq/L (ref 3.5–5.1)
SODIUM: 138 meq/L (ref 135–145)
TOTAL PROTEIN: 7 g/dL (ref 6.0–8.3)
Total Bilirubin: 0.9 mg/dL (ref 0.2–1.2)

## 2017-04-15 LAB — LIPID PANEL
CHOLESTEROL: 131 mg/dL (ref 0–200)
HDL: 52 mg/dL (ref >39.00)
LDL Cholesterol: 64 mg/dL (ref 0–99)
NonHDL: 79.12
TRIGLYCERIDES: 75 mg/dL (ref 0.0–149.0)
Total CHOL/HDL Ratio: 3
VLDL: 15 mg/dL (ref 0.0–40.0)

## 2017-04-15 NOTE — Progress Notes (Signed)
PCP notes:   Health maintenance:  Tetanus vaccine - postponed/insurance  Abnormal screenings:   Mini-Cog score: 12/20 MMSE - Mini Mental State Exam 04/15/2017 04/01/2016 03/25/2015  Orientation to time 0 5 5  Orientation to Place 5 5 5   Registration 3 3 -  Attention/ Calculation 0 0 5  Recall 0 0 0  Recall-comments - pt was unable to recall 3 of 3 words -  Language- name 2 objects 0 0 -  Language- repeat 1 1 1   Language- follow 3 step command 3 2 -  Language- follow 3 step command-comments - pt was unable to follow 1 of 3 step command -  Language- read & follow direction 0 0 -  Write a sentence 0 0 -  Copy design 0 0 -  Total score 12 16 -    Patient concerns:   None  Nurse concerns:  None  Next PCP appt:   04/18/17 @ 1030

## 2017-04-15 NOTE — Progress Notes (Signed)
Pre visit review using our clinic review tool, if applicable. No additional management support is needed unless otherwise documented below in the visit note. 

## 2017-04-15 NOTE — Progress Notes (Signed)
Subjective:   [name] is a 84 y.o. male who presents for Medicare Annual/Subsequent preventive examination.  Review of Systems:  N/A Cardiac Risk Factors include: advanced age (>69men, >77 women);male gender;dyslipidemia;hypertension     Objective:    Vitals: BP 118/78 (BP Location: Right Arm, Patient Position: Sitting, Cuff Size: Normal)   Pulse 72   Temp 98.8 F (37.1 C) (Oral)   Ht 5' 6.75" (1.695 m) Comment: no shoes  Wt 177 lb (80.3 kg)   SpO2 97%   BMI 27.93 kg/m   Body mass index is 27.93 kg/m.  Advanced Directives 04/15/2017 04/01/2016 03/25/2015  Does Patient Have a Medical Advance Directive? Yes Yes Yes  Type of Paramedic of [place];Living will [place];Living will [place];Living will  Does patient want to make changes to medical advance directive? - - No - Patient declined  Copy of [place] in Chart? Yes Yes Yes    Tobacco Social History   Tobacco Use  Smoking Status Former Smoker  . Types: Cigarettes  Smokeless Tobacco Never Used     Counseling given: No   Clinical Intake:  Pre-visit preparation completed: Yes  Pain : No/denies pain Pain Score: 0-No pain     Nutritional Status: BMI of 19-24  Normal Nutritional Risks: Other (Comment)  How often do you need to have someone help you when you read instructions, pamphlets, or other written materials from your doctor or pharmacy?: 1 - Never What is the last grade level you completed in school?: 12th grade  Interpreter Needed?: No  Comments: pt lives with spouse Information entered by :: LPinson, LPN  Past Medical History:  Diagnosis Date  . Allergic rhinitis   . BPH (benign prostatic hypertrophy) with urinary obstruction 2000   40gm, L lobe 41mm prostate nodule; followed yearly by Dr Diona Fanti  . GERD (gastroesophageal reflux disease)   . Glaucoma   . Herpes zoster ophthalmicus    history  . History of  Reeves stones remote   x 1-Ca monhydrate, hyperoxalate urine s/p lithotripsy  . HLD (hyperlipidemia)   . HTN (hypertension)   . Impaired glucose tolerance   . Impaired hearing    uses hearing aides bilaterally  . Osteopenia    Past Surgical History:  Procedure Laterality Date  . APPENDECTOMY  1997  . CATARACT EXTRACTION Right 11/1013   Dr. Venetia Maxon  . COLONOSCOPY  2002   WNL-[facility]; repeat 2012  . COLONOSCOPY  02/2012   tubular adenoma Deatra Ina)  . [place]   left-1955; right-1970  . LITHOTRIPSY  2005  . SBO  2008, 2010   Family History  Problem Relation Age of Onset  . Coronary artery disease Father   . Healthy Mother   . Diabetes Brother   . Breast cancer Sister   . Leukemia Maternal Uncle   . Pancreatic cancer Sister    Social History   Socioeconomic History  . Marital status: Married    Spouse name: None  . Number of children: 2  . Years of education: None  . Highest education level: None  Social Needs  . Financial resource strain: None  . Food insecurity - worry: None  . Food insecurity - inability: None  . Transportation needs - medical: None  . Transportation needs - non-medical: None  Occupational History  . Occupation: Marine scientist: retired  Tobacco Use  . Smoking status: Former Smoker  Types: Cigarettes  . Smokeless tobacco: Never Used  Substance and Sexual Activity  . Alcohol use: No  . Drug use: No  . Sexual activity: No  Other Topics Concern  . None  Social History Narrative   Minimal caffeine   Lives with wife, [name]. No pets   Nutritional therapist; working for [employer], quantity currently; retired from [employer]   Edu: 12th grade education   Activity: stays active outside   Diet: good water, fruits/vegetables daily    Outpatient Encounter Medications as of 04/15/2017  Medication Sig  . acetaminophen (TYLENOL) 500 MG tablet Take 500 mg by mouth 2 (two) times daily as needed.  Marland Kitchen aspirin 325 MG tablet Take 325 mg by mouth  daily.    Marland Kitchen atorvastatin (LIPITOR) 10 MG tablet TAKE 1/2 TABLET BY MOUTH DAILY  . b complex vitamins capsule Take 1 capsule by mouth daily.  . cholecalciferol (VITAMIN D) 1000 UNITS tablet Take 1,000 Units by mouth daily.  Marland Kitchen docusate sodium (COLACE) 100 MG capsule Take 100 mg by mouth daily.  . dorzolamide-timolol (COSOPT) 22.3-6.8 MG/ML ophthalmic solution Place 1 drop into both eyes 2 (two) times daily.    . fexofenadine (ALLEGRA) 180 MG tablet Take 180 mg by mouth daily.    Marland Kitchen latanoprost (XALATAN) 0.005 % ophthalmic solution Place 1 drop into both eyes at bedtime.    Marland Kitchen losartan (COZAAR) 100 MG tablet TAKE 1 TABLET BY MOUTH DAILY  . Multiple Vitamins-Minerals (MENS MULTI VITAMIN & MINERAL) TABS Take 1 tablet by mouth daily.    . niacin (NIASPAN) 500 MG CR tablet Take one tablet at bedtime  . Polyvinyl Alcohol-Povidone (REFRESH OP) Apply to eye 2 (two) times daily.  Dorette Grate Z 0.004 % SOLN ophthalmic solution    No facility-administered encounter medications on file as of 04/15/2017.     Activities of Daily Living In your present state of health, do you have any difficulty performing the following activities: 04/15/2017  Hearing? Y  Vision? N  Difficulty concentrating or making decisions? Y  Walking or climbing stairs? N  Dressing or bathing? N  Doing errands, shopping? N  Preparing Food and eating ? N  Using the Toilet? N  In the past six months, have you accidently leaked urine? N  Do you have problems with loss of bowel control? N  Managing your Medications? N  Managing your Finances? N  Housekeeping or managing your Housekeeping? N  Some recent data might be hidden    Patient Care Team: [name], MD as PCP - General (Family Medicine) Marylynn Pearson, MD as Consulting Physician (Ophthalmology) Franchot Gallo, MD as Consulting Physician (Urology) Lavella Hammock, DDS as Consulting Physician (Dentistry)   Assessment:   This is a routine wellness examination for  [name].  Exercise Activities and Dietary recommendations Current Exercise Habits: The patient does not participate in regular exercise at present, Exercise limited by: None identified  Goals    . Follow up with Primary Care Provider     Starting 04/15/2017, I will continue to take medications as prescribed and to keep appointments with PCP as scheduled.     . Increase water intake     Beginning 04/15/2017, I will continue drinking at least 8 oz water per day.        Fall Risk Fall Risk  04/15/2017 04/01/2016 03/25/2015 03/20/2014 01/04/2013  Falls in the past year? No No No Yes No  Number falls in past yr: - - - 1 -  Comment - - - Slipped  on the ice 03/19/14 -  Injury with Fall? - - - No -   Depression Screen PHQ 2/9 Scores 04/15/2017 04/01/2016 03/25/2015 03/20/2014  PHQ - 2 Score 0 0 0 0  PHQ- 9 Score 0 - - -    Cognitive Function MMSE - Mini Mental State Exam 04/15/2017 04/01/2016 03/25/2015  Orientation to time 0 5 5  Orientation to Place 5 5 5   Registration 3 3 -  Attention/ Calculation 0 0 5  Recall 0 0 0  Recall-comments - pt was unable to recall 3 of 3 words -  Language- name 2 objects 0 0 -  Language- repeat 1 1 1   Language- follow 3 step command 3 2 -  Language- follow 3 step command-comments - pt was unable to follow 1 of 3 step command -  Language- read & follow direction 0 0 -  Write a sentence 0 0 -  Copy design 0 0 -  Total score 12 16 -     PLEASE NOTE: A Mini-Cog screen was completed. Maximum score is 20. A value of 0 denotes this part of [place] MMSE was not completed or the patient failed this part of the Mini-Cog screening.   Mini-Cog Screening Orientation to Time - Max 5 pts Orientation to Place - Max 5 pts Registration - Max 3 pts Recall - Max 3 pts Language Repeat - Max 1 pts Language Follow 3 Step Command - Max 3 pts     Immunization History  Administered Date(s) Administered  . Influenza Whole 12/06/2009, 11/29/2011  . Influenza, High Dose Seasonal  PF 12/15/2016  . Influenza,inj,Quad PF,6+ Mos 11/02/2012, 11/05/2013, 11/11/2014, 11/12/2015  . Pneumococcal Conjugate-13 03/20/2014  . Pneumococcal Polysaccharide-23 07/23/2005  . Td 02/22/2002  . Zoster 10/01/2010    Screening Tests Health Maintenance  Topic Date Due  . DTaP/Tdap/Td (1 - Tdap) 04/15/2018 (Originally 02/23/2002)  . TETANUS/TDAP  04/15/2018 (Originally 02/23/2012)  . INFLUENZA VACCINE  Completed  . PNA vac Low Risk Adult  Completed      Plan:     I have personally reviewed, addressed, and noted the following in the patient's chart:  A. Medical and social history B. Use of alcohol, tobacco or illicit drugs  C. Current medications and supplements D. Functional ability and status E.  Nutritional status F.  Physical activity G. Advance directives H. List of other physicians I.  Hospitalizations, surgeries, and ER visits in previous 12 months J.  [place] to include hearing, vision, cognitive, depression L. Referrals and appointments - none  In addition, I have reviewed and discussed with patient certain preventive protocols, quality metrics, and best practice recommendations. A written personalized care plan for preventive services as well as general preventive health recommendations were provided to patient.  See attached scanned questionnaire for additional information.   Signed,   Lindell Noe, MHA, BS, LPN Health Coach

## 2017-04-15 NOTE — Patient Instructions (Signed)
[name] , Thank you for taking time to come for your Medicare Wellness Visit. I appreciate your ongoing commitment to your health goals. Please review the following plan we discussed and let me know if I can assist you in the future.   These are the goals we discussed: Goals    . Follow up with Primary Care Provider     Starting 04/15/2017, I will continue to take medications as prescribed and to keep appointments with PCP as scheduled.     . Increase water intake     Beginning 04/15/2017, I will continue drinking at least 8 oz water per day.        This is a list of the screening recommended for you and due dates:  Health Maintenance  Topic Date Due  . DTaP/Tdap/Td vaccine (1 - Tdap) 04/15/2018*  . Tetanus Vaccine  04/15/2018*  . Flu Shot  Completed  . Pneumonia vaccines  Completed  *Topic was postponed. The date shown is not the original due date.   Preventive Care for Adults  A healthy lifestyle and preventive care can promote health and wellness. Preventive health guidelines for adults include the following key practices.  . A routine yearly physical is a good way to check with your health care provider about your health and preventive screening. It is a chance to share any concerns and updates on your health and to receive a thorough exam.  . Visit your dentist for a routine exam and preventive care every 6 months. Brush your teeth twice a day and floss once a day. Good oral hygiene prevents tooth decay and gum disease.  . The frequency of eye exams is based on your age, health, family medical history, use  of contact lenses, and other factors. Follow your health care provider's recommendations for frequency of eye exams.  . Eat a healthy diet. Foods like vegetables, fruits, whole grains, low-fat dairy products, and lean protein foods contain the nutrients you need without too many calories. Decrease your intake of foods high in solid fats, added sugars, and salt. Eat the  right amount of calories for you. Get information about a proper diet from your health care provider, if necessary.  . Regular physical exercise is one of the most important things you can do for your health. Most adults should get at least 150 minutes of moderate-intensity exercise (any activity that increases your heart rate and causes you to sweat) each week. In addition, most adults need muscle-strengthening exercises on 2 or more days a week.  Silver Sneakers may be a benefit available to you. To determine eligibility, you may visit the website: www.silversneakers.com or contact program at (323)512-1226 Mon-Fri between 8AM-8PM.   . Maintain a healthy weight. The body mass index (BMI) is a screening tool to identify possible weight problems. It provides an estimate of body fat based on height and weight. Your health care provider can find your BMI and can help you achieve or maintain a healthy weight.   For adults 20 years and older: ? A BMI below 18.5 is considered underweight. ? A BMI of 18.5 to 24.9 is normal. ? A BMI of 25 to 29.9 is considered overweight. ? A BMI of 30 and above is considered obese.   . Maintain normal blood lipids and cholesterol levels by exercising and minimizing your intake of saturated fat. Eat a balanced diet with plenty of fruit and vegetables. Blood tests for lipids and cholesterol should begin at age 64 and be  repeated every 5 years. If your lipid or cholesterol levels are high, you are over 50, or you are at high risk for heart disease, you may need your cholesterol levels checked more frequently. Ongoing high lipid and cholesterol levels should be treated with medicines if diet and exercise are not working.  . If you smoke, find out from your health care provider how to quit. If you do not use tobacco, please do not start.  . If you choose to drink alcohol, please do not consume more than 2 drinks per day. One drink is considered to be 12 ounces (355 mL) of  beer, 5 ounces (148 mL) of wine, or 1.5 ounces (44 mL) of liquor.  . If you are 5-65 years old, ask your health care provider if you should take aspirin to prevent strokes.  . Use sunscreen. Apply sunscreen liberally and repeatedly throughout the day. You should seek shade when your shadow is shorter than you. Protect yourself by wearing long sleeves, pants, a wide-brimmed hat, and sunglasses year round, whenever you are outdoors.  . Once a month, do a whole body skin exam, using a mirror to look at the skin on your back. Tell your health care provider of new moles, moles that have irregular borders, moles that are larger than a pencil eraser, or moles that have changed in shape or color.

## 2017-04-17 NOTE — Progress Notes (Signed)
I reviewed health advisor's note, was available for consultation, and agree with documentation and plan.  

## 2017-04-18 ENCOUNTER — Ambulatory Visit (INDEPENDENT_AMBULATORY_CARE_PROVIDER_SITE_OTHER): Payer: Medicare Other | Admitting: Family Medicine

## 2017-04-18 ENCOUNTER — Encounter: Payer: Self-pay | Admitting: Family Medicine

## 2017-04-18 VITALS — BP 118/68 | HR 77 | Temp 97.9°F | Ht 67.0 in | Wt 175.5 lb

## 2017-04-18 DIAGNOSIS — E785 Hyperlipidemia, unspecified: Secondary | ICD-10-CM

## 2017-04-18 DIAGNOSIS — G3184 Mild cognitive impairment, so stated: Secondary | ICD-10-CM

## 2017-04-18 DIAGNOSIS — R011 Cardiac murmur, unspecified: Secondary | ICD-10-CM | POA: Diagnosis not present

## 2017-04-18 DIAGNOSIS — Z7189 Other specified counseling: Secondary | ICD-10-CM | POA: Diagnosis not present

## 2017-04-18 DIAGNOSIS — I10 Essential (primary) hypertension: Secondary | ICD-10-CM | POA: Diagnosis not present

## 2017-04-18 MED ORDER — DONEPEZIL HCL 5 MG PO TABS: 5.0000 mg | ORAL_TABLET | Freq: Every day | ORAL | 7 refills | Status: DC

## 2017-04-18 NOTE — Assessment & Plan Note (Signed)
Chronic, stable. Continue current regimen. 

## 2017-04-18 NOTE — Progress Notes (Signed)
BP 118/68 (BP Location: Left Arm, Patient Position: Sitting, Cuff Size: Normal)   Pulse 77   Temp 97.9 F (36.6 C) (Oral)   Ht 5\' 7"  (1.702 m)   Wt 175 lb 8 oz (79.6 kg)   SpO2 97%   BMI 27.49 kg/m    CC: AMW f/u visit Subjective:    Patient ID: [name], male    DOB: [birth year], 84 y.o.   MRN: [id number]  HPI: MUNEEB VERAS is a 84 y.o. male presenting on 04/18/2017 for Annual Exam (Pt 2.)   Here with wife.   Saw Lesia last week for medicare wellness visit. Note reviewed.  MiniCog 12/20 - deterioration from last year. Previously declined memory medication.   He continues to drive.  Children live nearby.  Voiding well, no urinary incontinence.   Geriatric Assessment: Activities of Daily Living:     Bathing- independent     Dressing- independent    Eating- independent    Toileting- independent    Transferring-independent    Continence- independent Overall Assessment: independent  Instrumental Activities of Daily Living:     Transportation- independent    Meal/Food Preparation- dependent     Shopping Errands- independent     Housekeeping/Chores- dependent (does Therapist, occupational)     Money Management/Finances- dependent     Medication Management- independent    Ability to Use Telephone- independent    Laundry- dependent  Overall Assessment:  Partially dependent  Mental Status Exam: (value/max value) 22/30  Hard of hearing despite hearing aides.      Clock Drawing Score: 3/4  Preventative: Colonoscopy 02/2012, tubular adenoma. (Dr Deatra Ina). No fmhx colon cancer. Age out.  Prostate - h/o spot on prostate. Checked by Dr Diona Fanti yearly. No fmhx prostate cancer.  Flu shot yearly Pneumovax 2007. prevnar - 2016 Tetanus 2004.  zostavax - 2012.  shingrix - discussed Living will - have at home, living will in chart (02/2013) but no formal HCPOA form. Children know, 2 children are co-HCPOA and co-beneficiaries. Would desire temporary life support but not prolonged.    Seat belt use discussed Sunscreen use discussed. No changing moles on skin. Non smoker Alcohol - none  Minimal caffeine Lives with wife, [name]. No pets Nutritional therapist; working for [employer], quantity currently; retired from [employer] Edu: 12th grade education Activity: stays active outside Diet: good water, fruits/vegetables daily  Relevant past medical, surgical, family and social history reviewed and updated as indicated. Interim medical history since our last visit reviewed. Allergies and medications reviewed and updated. Outpatient Medications Prior to Visit  Medication Sig Dispense Refill  . acetaminophen (TYLENOL) 500 MG tablet Take 500 mg by mouth 2 (two) times daily as needed.    Marland Kitchen aspirin EC 81 MG tablet Take 81 mg by mouth daily.    Marland Kitchen atorvastatin (LIPITOR) 10 MG tablet TAKE 1/2 TABLET BY MOUTH DAILY 45 tablet 0  . b complex vitamins capsule Take 1 capsule by mouth daily.    . cholecalciferol (VITAMIN D) 1000 UNITS tablet Take 1,000 Units by mouth daily.    Marland Kitchen docusate sodium (COLACE) 100 MG capsule Take 100 mg by mouth daily.    . dorzolamide-timolol (COSOPT) 22.3-6.8 MG/ML ophthalmic solution Place 1 drop into both eyes 2 (two) times daily.      . fexofenadine (ALLEGRA) 180 MG tablet Take 180 mg by mouth daily.      Marland Kitchen latanoprost (XALATAN) 0.005 % ophthalmic solution Place 1 drop into both eyes at bedtime.      Marland Kitchen  losartan (COZAAR) 100 MG tablet TAKE 1 TABLET BY MOUTH DAILY 90 tablet 0  . Multiple Vitamins-Minerals (MENS MULTI VITAMIN & MINERAL) TABS Take 1 tablet by mouth daily.      . niacin (NIASPAN) 500 MG CR tablet Take one tablet at bedtime 90 tablet 3  . Polyvinyl Alcohol-Povidone (REFRESH OP) Apply to eye 2 (two) times daily.    . TRAVATAN Z 0.004 % SOLN ophthalmic solution     . aspirin 325 MG tablet Take 325 mg by mouth daily.       No facility-administered medications prior to visit.      Per HPI unless specifically indicated in ROS section below Review of Systems      Objective:    BP 118/68 (BP Location: Left Arm, Patient Position: Sitting, Cuff Size: Normal)   Pulse 77   Temp 97.9 F (36.6 C) (Oral)   Ht 5\' 7"  (1.702 m)   Wt 175 lb 8 oz (79.6 kg)   SpO2 97%   BMI 27.49 kg/m   Wt Readings from Last 3 Encounters:  04/18/17 175 lb 8 oz (79.6 kg)  04/15/17 177 lb (80.3 kg)  04/08/16 177 lb 4 oz (80.4 kg)    Physical Exam  Constitutional: He is oriented to person, place, and time. He appears well-developed and well-nourished. No distress.  HENT:  Head: Normocephalic and atraumatic.  Right Ear: Hearing, tympanic membrane, external ear and ear canal normal.  Left Ear: Hearing, tympanic membrane, external ear and ear canal normal.  Nose: Nose normal.  Mouth/Throat: Uvula is midline, oropharynx is clear and moist and mucous membranes are normal. No oropharyngeal exudate, posterior oropharyngeal edema or posterior oropharyngeal erythema.  Eyes: Conjunctivae and EOM are normal. Pupils are equal, round, and reactive to light. No scleral icterus.  Neck: Normal range of motion. Neck supple. Carotid bruit is not present. No thyromegaly present.  Cardiovascular: Normal rate, regular rhythm, normal heart sounds and intact distal pulses.  No murmur heard. Pulses:      Radial pulses are 2+ on the right side, and 2+ on the left side.  Pulmonary/Chest: Effort normal and breath sounds normal. No respiratory distress. He has no wheezes. He has no rales.  Abdominal: Soft. Bowel sounds are normal. He exhibits no distension and no mass. There is no tenderness. There is no rebound and no guarding.  Musculoskeletal: Normal range of motion. He exhibits no edema.  Lymphadenopathy:    He has no cervical adenopathy.  Neurological: He is alert and oriented to person, place, and time.  CN grossly intact, station and gait intact  Skin: Skin is warm and dry. No rash noted.  Psychiatric: He has a normal mood and affect. His behavior is normal. Judgment and thought content  normal.  Nursing note and vitals reviewed.  Results for orders placed or performed in visit on 04/15/17  Comprehensive metabolic panel  Result Value Ref Range   Sodium 138 135 - 145 mEq/L   Potassium 4.4 3.5 - 5.1 mEq/L   Chloride 104 96 - 112 mEq/L   CO2 27 19 - 32 mEq/L   Glucose, Bld 103 (H) 70 - 99 mg/dL   BUN 27 (H) 6 - 23 mg/dL   Creatinine, Ser 0.83 0.40 - 1.50 mg/dL   Total Bilirubin 0.9 0.2 - 1.2 mg/dL   Alkaline Phosphatase 44 39 - 117 U/L   AST 19 0 - 37 U/L   ALT 18 0 - 53 U/L   Total Protein 7.0 6.0 - 8.3  g/dL   Albumin 4.0 3.5 - 5.2 g/dL   Calcium 9.7 8.4 - 10.5 mg/dL   GFR 93.16 >60.00 mL/min  Lipid panel  Result Value Ref Range   Cholesterol 131 0 - 200 mg/dL   Triglycerides 75.0 0.0 - 149.0 mg/dL   HDL 52.00 >39.00 mg/dL   VLDL 15.0 0.0 - 40.0 mg/dL   LDL Cholesterol 64 0 - 99 mg/dL   Total CHOL/HDL Ratio 3    NonHDL 79.12       Assessment & Plan:   Problem List Items Addressed This Visit    Advanced care planning/counseling discussion    Living will - have at home, living will in chart (02/2013) but no formal HCPOA form. Children know, 2 children are co-HC POA and co-beneficiaries. Would desire temporary life support but not prolonged.       Cardiac murmur    Minimal .      Dyslipidemia    Chronic, stable. Continue current regimen. The ASCVD Risk score Mikey Bussing DC Jr., et al., 2013) failed to calculate for the following reasons:   The 2013 ASCVD risk score is only valid for ages 51 to 78       Essential hypertension    Chronic, stable. Continue current regimen.       Relevant Medications   aspirin EC 81 MG tablet   MCI (mild cognitive impairment) with memory loss - Primary    Memory testing today concerning for developing dementia or at least MCI with memory loss with MMSE 22/30. Reviewed with patient and wife. Encouraged regular activity, mental stimulation, social engagement. Discussed caution with driving and possible need to limit this. Will  trial aricept 5mg  daily, cautioned with GI side effects.           Meds ordered this encounter  Medications  . donepezil (ARICEPT) 5 MG tablet    Sig: Take 1 tablet (5 mg total) by mouth at bedtime.    Dispense:  30 tablet    Refill:  7   No orders of the defined types were placed in this encounter.   Follow up plan: Return in about 6 months (around 10/16/2017) for follow up visit.  [name], MD

## 2017-04-18 NOTE — Assessment & Plan Note (Signed)
Minimal

## 2017-04-18 NOTE — Assessment & Plan Note (Signed)
Living will - have at home, living will in chart (02/2013) but no formal HCPOA form. Children know, 2 children are co-HC POA and co-beneficiaries. Would desire temporary life support but not prolonged.  

## 2017-04-18 NOTE — Assessment & Plan Note (Signed)
Chronic, stable. Continue current regimen.  The ASCVD Risk score (Goff DC Jr., et al., 2013) failed to calculate for the following reasons:   The 2013 ASCVD risk score is only valid for ages 40 to 79  

## 2017-04-18 NOTE — Patient Instructions (Addendum)
If interested, check with pharmacy about new 2 shot shingles series (shingrix).  Decrease aspirin to 81mg  daily.  Testing today does raise concerns for some memory trouble - let's try aricept 5mg  daily, watch for nausea.  Return in 6 months for follow up visit.   Health Maintenance, Male A healthy lifestyle and preventive care is important for your health and wellness. Ask your health care provider about what schedule of regular examinations is right for you. What should I know about weight and diet? Eat a Healthy Diet  Eat plenty of vegetables, fruits, whole grains, low-fat dairy products, and lean protein.  Do not eat a lot of foods high in solid fats, added sugars, or salt.  Maintain a Healthy Weight Regular exercise can help you achieve or maintain a healthy weight. You should:  Do at least 150 minutes of exercise each week. The exercise should increase your heart rate and make you sweat (moderate-intensity exercise).  Do strength-training exercises at least twice a week.  Watch Your Levels of Cholesterol and Blood Lipids  Have your blood tested for lipids and cholesterol every 5 years starting at 82 years of age. If you are at high risk for heart disease, you should start having your blood tested when you are 82 years old. You may need to have your cholesterol levels checked more often if: ? Your lipid or cholesterol levels are high. ? You are older than 82 years of age. ? You are at high risk for heart disease.  What should I know about cancer screening? Many types of cancers can be detected early and may often be prevented. Lung Cancer  You should be screened every year for lung cancer if: ? You are a current smoker who has smoked for at least 30 years. ? You are a former smoker who has quit within the past 15 years.  Talk to your health care provider about your screening options, when you should start screening, and how often you should be screened.  Colorectal  Cancer  Routine colorectal cancer screening usually begins at 82 years of age and should be repeated every 5-10 years until you are 82 years old. You may need to be screened more often if early forms of precancerous polyps or small growths are found. Your health care provider may recommend screening at an earlier age if you have risk factors for colon cancer.  Your health care provider may recommend using home test kits to check for hidden blood in the stool.  A small camera at the end of a tube can be used to examine your colon (sigmoidoscopy or colonoscopy). This checks for the earliest forms of colorectal cancer.  Prostate and Testicular Cancer  Depending on your age and overall health, your health care provider may do certain tests to screen for prostate and testicular cancer.  Talk to your health care provider about any symptoms or concerns you have about testicular or prostate cancer.  Skin Cancer  Check your skin from head to toe regularly.  Tell your health care provider about any new moles or changes in moles, especially if: ? There is a change in a mole's size, shape, or color. ? You have a mole that is larger than a pencil eraser.  Always use sunscreen. Apply sunscreen liberally and repeat throughout the day.  Protect yourself by wearing long sleeves, pants, a wide-brimmed hat, and sunglasses when outside.  What should I know about heart disease, diabetes, and high blood pressure?  If you  are 13-51 years of age, have your blood pressure checked every 3-5 years. If you are 79 years of age or older, have your blood pressure checked every year. You should have your blood pressure measured twice-once when you are at a hospital or clinic, and once when you are not at a hospital or clinic. Record the average of the two measurements. To check your blood pressure when you are not at a hospital or clinic, you can use: ? An automated blood pressure machine at a pharmacy. ? A home blood  pressure monitor.  Talk to your health care provider about your target blood pressure.  If you are between 73-46 years old, ask your health care provider if you should take aspirin to prevent heart disease.  Have regular diabetes screenings by checking your fasting blood sugar level. ? If you are at a normal weight and have a low risk for diabetes, have this test once every three years after the age of 59. ? If you are overweight and have a high risk for diabetes, consider being tested at a younger age or more often.  A one-time screening for abdominal aortic aneurysm (AAA) by ultrasound is recommended for men aged 9-75 years who are current or former smokers. What should I know about preventing infection? Hepatitis B If you have a higher risk for hepatitis B, you should be screened for this virus. Talk with your health care provider to find out if you are at risk for hepatitis B infection. Hepatitis C Blood testing is recommended for:  Everyone born from [birth year] through 1963.  Anyone with known risk factors for hepatitis C.  Sexually Transmitted Diseases (STDs)  You should be screened each year for STDs including gonorrhea and chlamydia if: ? You are sexually active and are younger than 82 years of age. ? You are older than 82 years of age and your health care provider tells you that you are at risk for this type of infection. ? Your sexual activity has changed since you were last screened and you are at an increased risk for chlamydia or gonorrhea. Ask your health care provider if you are at risk.  Talk with your health care provider about whether you are at high risk of being infected with HIV. Your health care provider may recommend a prescription medicine to help prevent HIV infection.  What else can I do?  Schedule regular health, dental, and eye exams.  Stay current with your vaccines (immunizations).  Do not use any tobacco products, such as cigarettes, chewing tobacco, and  e-cigarettes. If you need help quitting, ask your health care provider.  Limit alcohol intake to no more than 2 drinks per day. One drink equals 12 ounces of beer, 5 ounces of wine, or 1 ounces of hard liquor.  Do not use street drugs.  Do not share needles.  Ask your health care provider for help if you need support or information about quitting drugs.  Tell your health care provider if you often feel depressed.  Tell your health care provider if you have ever been abused or do not feel safe at home. This information is not intended to replace advice given to you by your health care provider. Make sure you discuss any questions you have with your health care provider. Document Released: 08/07/2007 Document Revised: 10/08/2015 Document Reviewed: 11/12/2014 Elsevier Interactive Patient Education  [employer].

## 2017-04-18 NOTE — Assessment & Plan Note (Signed)
Memory testing today concerning for developing dementia or at least MCI with memory loss with MMSE 22/30. Reviewed with patient and wife. Encouraged regular activity, mental stimulation, social engagement. Discussed caution with driving and possible need to limit this. Will trial aricept 5mg  daily, cautioned with GI side effects.

## 2017-05-02 ENCOUNTER — Other Ambulatory Visit: Payer: Self-pay | Admitting: Family Medicine

## 2017-05-02 DIAGNOSIS — I10 Essential (primary) hypertension: Secondary | ICD-10-CM

## 2017-05-30 ENCOUNTER — Other Ambulatory Visit: Payer: Self-pay | Admitting: Family Medicine

## 2017-07-14 DIAGNOSIS — H2512 Age-related nuclear cataract, left eye: Secondary | ICD-10-CM | POA: Diagnosis not present

## 2017-07-14 DIAGNOSIS — H401133 Primary open-angle glaucoma, bilateral, severe stage: Secondary | ICD-10-CM | POA: Diagnosis not present

## 2017-08-17 DIAGNOSIS — D0439 Carcinoma in situ of skin of other parts of face: Secondary | ICD-10-CM | POA: Diagnosis not present

## 2017-08-17 DIAGNOSIS — L570 Actinic keratosis: Secondary | ICD-10-CM | POA: Diagnosis not present

## 2017-08-17 DIAGNOSIS — X32XXXA Exposure to sunlight, initial encounter: Secondary | ICD-10-CM | POA: Diagnosis not present

## 2017-08-24 DIAGNOSIS — N400 Enlarged prostate without lower urinary tract symptoms: Secondary | ICD-10-CM | POA: Diagnosis not present

## 2017-08-24 DIAGNOSIS — N402 Nodular prostate without lower urinary tract symptoms: Secondary | ICD-10-CM | POA: Diagnosis not present

## 2017-09-20 DIAGNOSIS — X32XXXD Exposure to sunlight, subsequent encounter: Secondary | ICD-10-CM | POA: Diagnosis not present

## 2017-09-20 DIAGNOSIS — L570 Actinic keratosis: Secondary | ICD-10-CM | POA: Diagnosis not present

## 2017-09-20 DIAGNOSIS — Z08 Encounter for follow-up examination after completed treatment for malignant neoplasm: Secondary | ICD-10-CM | POA: Diagnosis not present

## 2017-09-20 DIAGNOSIS — Z85828 Personal history of other malignant neoplasm of skin: Secondary | ICD-10-CM | POA: Diagnosis not present

## 2017-10-19 ENCOUNTER — Ambulatory Visit: Payer: Medicare Other | Admitting: Family Medicine

## 2017-10-19 ENCOUNTER — Encounter: Payer: Self-pay | Admitting: Family Medicine

## 2017-10-19 ENCOUNTER — Ambulatory Visit (INDEPENDENT_AMBULATORY_CARE_PROVIDER_SITE_OTHER): Payer: Medicare Other | Admitting: Family Medicine

## 2017-10-19 VITALS — BP 120/62 | HR 87 | Temp 97.8°F | Ht 67.0 in | Wt 173.2 lb

## 2017-10-19 DIAGNOSIS — G3184 Mild cognitive impairment, so stated: Secondary | ICD-10-CM | POA: Diagnosis not present

## 2017-10-19 DIAGNOSIS — H9193 Unspecified hearing loss, bilateral: Secondary | ICD-10-CM

## 2017-10-19 DIAGNOSIS — E785 Hyperlipidemia, unspecified: Secondary | ICD-10-CM | POA: Diagnosis not present

## 2017-10-19 DIAGNOSIS — H9190 Unspecified hearing loss, unspecified ear: Secondary | ICD-10-CM | POA: Insufficient documentation

## 2017-10-19 MED ORDER — DONEPEZIL HCL 10 MG PO TABS: 10.0000 mg | ORAL_TABLET | Freq: Every day | ORAL | 1 refills | Status: DC

## 2017-10-19 NOTE — Assessment & Plan Note (Addendum)
Stable period, has tolerated low dose aricept - will increase to 10mg  daily. New dose at pharmacy. Discussed monitoring for palpitations, nausea, PUD symptoms. Monitor for urinary obstruction in BPH hx.

## 2017-10-19 NOTE — Assessment & Plan Note (Addendum)
Followed by audiology.  Has new speakers for his hearing aides.

## 2017-10-19 NOTE — Assessment & Plan Note (Signed)
He has stopped niacin - was causing worsening heartburn.

## 2017-10-19 NOTE — Patient Instructions (Addendum)
Stay off niacin. Increase aricept (donepezil) to 10mg  daily - double up until you run out, new dose will be at the pharmacy.  Return as needed or in 6 months for wellness visit.

## 2017-10-19 NOTE — Progress Notes (Signed)
BP 120/62 (BP Location: Left Arm, Patient Position: Sitting, Cuff Size: Normal)   Pulse 87   Temp 97.8 F (36.6 C) (Oral)   Ht 5\' 7"  (1.702 m)   Wt 173 lb 4 oz (78.6 kg)   SpO2 95%   BMI 27.13 kg/m    CC: 6 mo f/u visit Subjective:    Patient ID: [name], male    DOB: [birth year], 84 y.o.   MRN: [id number]  HPI: ANTWONE CAPOZZOLI is a 84 y.o. male presenting on 10/19/2017 for 6 mo follow up (Pt accompanied by his wife.)   See prior note for details. Last visit MMSE 22/30. Concern for MCI vs developing dementia. We started aricept 5mg  daily.   He continues mowing lawn, about once a week. Continues driving. Wife notes hearing has gotten worse. Saw hearing doctor las week - new speakers.   Relevant past medical, surgical, family and social history reviewed and updated as indicated. Interim medical history since our last visit reviewed. Allergies and medications reviewed and updated. Outpatient Medications Prior to Visit  Medication Sig Dispense Refill  . acetaminophen (TYLENOL) 500 MG tablet Take 500 mg by mouth 2 (two) times daily as needed.    Marland Kitchen aspirin EC 81 MG tablet Take 81 mg by mouth daily.    Marland Kitchen atorvastatin (LIPITOR) 10 MG tablet TAKE 1/2 TABLET BY MOUTH DAILY 45 tablet 3  . b complex vitamins capsule Take 1 capsule by mouth daily.    . cholecalciferol (VITAMIN D) 1000 UNITS tablet Take 1,000 Units by mouth daily.    Marland Kitchen docusate sodium (COLACE) 100 MG capsule Take 100 mg by mouth daily.    . dorzolamide-timolol (COSOPT) 22.3-6.8 MG/ML ophthalmic solution Place 1 drop into both eyes 2 (two) times daily.      . fexofenadine (ALLEGRA) 180 MG tablet Take 180 mg by mouth daily.      Marland Kitchen latanoprost (XALATAN) 0.005 % ophthalmic solution Place 1 drop into both eyes at bedtime.      Marland Kitchen losartan (COZAAR) 100 MG tablet TAKE 1 TABLET BY MOUTH DAILY 90 tablet 3  . Multiple Vitamins-Minerals (MENS MULTI VITAMIN & MINERAL) TABS Take 1 tablet by mouth daily.      . Polyvinyl  Alcohol-Povidone (REFRESH OP) Apply to eye 2 (two) times daily.    . TRAVATAN Z 0.004 % SOLN ophthalmic solution     . donepezil (ARICEPT) 5 MG tablet Take 1 tablet (5 mg total) by mouth at bedtime. 30 tablet 7  . niacin (NIASPAN) 500 MG CR tablet Take one tablet at bedtime 90 tablet 3   No facility-administered medications prior to visit.      Per HPI unless specifically indicated in ROS section below Review of Systems     Objective:    BP 120/62 (BP Location: Left Arm, Patient Position: Sitting, Cuff Size: Normal)   Pulse 87   Temp 97.8 F (36.6 C) (Oral)   Ht 5\' 7"  (1.702 m)   Wt 173 lb 4 oz (78.6 kg)   SpO2 95%   BMI 27.13 kg/m   Wt Readings from Last 3 Encounters:  10/19/17 173 lb 4 oz (78.6 kg)  04/18/17 175 lb 8 oz (79.6 kg)  04/15/17 177 lb (80.3 kg)    Physical Exam  Constitutional: He appears well-developed. No distress.  HENT:  Right Ear: Decreased hearing is noted.  Left Ear: Decreased hearing is noted.  Cardiovascular: Normal rate, regular rhythm and normal heart sounds.  No murmur heard. Pulmonary/Chest:  Effort normal and breath sounds normal. No respiratory distress. He has no wheezes. He has no rales.  Musculoskeletal: He exhibits no edema.  Skin: Skin is warm. No rash noted.  Nursing note and vitals reviewed.     Assessment & Plan:   Problem List Items Addressed This Visit    MCI (mild cognitive impairment) with memory loss - Primary    Stable period, has tolerated low dose aricept - will increase to 10mg  daily. New dose at pharmacy. Discussed monitoring for palpitations, nausea, PUD symptoms. Monitor for urinary obstruction in BPH hx.       Hearing loss    Followed by audiology.  Has new speakers for his hearing aides.       Dyslipidemia    He has stopped niacin - was causing worsening heartburn.          Meds ordered this encounter  Medications  . donepezil (ARICEPT) 10 MG tablet    Sig: Take 1 tablet (10 mg total) by mouth at bedtime.     Dispense:  90 tablet    Refill:  1   No orders of the defined types were placed in this encounter.   Follow up plan: Return in about 6 months (around 04/21/2018) for medicare wellness visit, follow up visit.  [name], MD

## 2017-11-23 DIAGNOSIS — H2512 Age-related nuclear cataract, left eye: Secondary | ICD-10-CM | POA: Diagnosis not present

## 2017-11-23 DIAGNOSIS — H401133 Primary open-angle glaucoma, bilateral, severe stage: Secondary | ICD-10-CM | POA: Diagnosis not present

## 2018-01-04 ENCOUNTER — Ambulatory Visit (INDEPENDENT_AMBULATORY_CARE_PROVIDER_SITE_OTHER): Payer: Medicare Other

## 2018-01-04 DIAGNOSIS — Z23 Encounter for immunization: Secondary | ICD-10-CM | POA: Diagnosis not present

## 2018-02-02 ENCOUNTER — Telehealth: Payer: Self-pay

## 2018-02-02 NOTE — Telephone Encounter (Signed)
Received fax from [facility] stating they cannot get losartan 100 mg tabs. Requesting rx for an alternative med.

## 2018-02-03 MED ORDER — VALSARTAN 160 MG PO TABS: 160.0000 mg | ORAL_TABLET | Freq: Every day | ORAL | 3 refills | Status: DC

## 2018-02-03 NOTE — Telephone Encounter (Signed)
Let's change to valsartan 160mg  daily sent to pharmacy. plz notify pt.

## 2018-02-03 NOTE — Telephone Encounter (Signed)
Spoke with pt and his wife, [name], relaying Dr. Synthia Innocent message and reason for the change. They verbalize understanding and express their thanks for the call.

## 2018-03-29 DIAGNOSIS — H401133 Primary open-angle glaucoma, bilateral, severe stage: Secondary | ICD-10-CM | POA: Diagnosis not present

## 2018-03-29 DIAGNOSIS — H47233 Glaucomatous optic atrophy, bilateral: Secondary | ICD-10-CM | POA: Diagnosis not present

## 2018-03-29 DIAGNOSIS — H43813 Vitreous degeneration, bilateral: Secondary | ICD-10-CM | POA: Diagnosis not present

## 2018-04-17 ENCOUNTER — Ambulatory Visit (INDEPENDENT_AMBULATORY_CARE_PROVIDER_SITE_OTHER): Payer: Medicare Other

## 2018-04-17 ENCOUNTER — Other Ambulatory Visit: Payer: Self-pay | Admitting: Family Medicine

## 2018-04-17 ENCOUNTER — Ambulatory Visit: Payer: Medicare Other

## 2018-04-17 VITALS — BP 116/70 | HR 73 | Temp 97.8°F | Ht 66.0 in | Wt 162.8 lb

## 2018-04-17 DIAGNOSIS — Z0000 Encounter for general adult medical examination without abnormal findings: Secondary | ICD-10-CM

## 2018-04-17 DIAGNOSIS — E785 Hyperlipidemia, unspecified: Secondary | ICD-10-CM

## 2018-04-17 DIAGNOSIS — I10 Essential (primary) hypertension: Secondary | ICD-10-CM

## 2018-04-17 DIAGNOSIS — G3184 Mild cognitive impairment, so stated: Secondary | ICD-10-CM | POA: Diagnosis not present

## 2018-04-17 LAB — COMPREHENSIVE METABOLIC PANEL
ALBUMIN: 4.4 g/dL (ref 3.5–5.2)
ALK PHOS: 54 U/L (ref 39–117)
ALT: 23 U/L (ref 0–53)
AST: 23 U/L (ref 0–37)
BUN: 26 mg/dL — AB (ref 6–23)
CO2: 28 mEq/L (ref 19–32)
CREATININE: 0.89 mg/dL (ref 0.40–1.50)
Calcium: 9.8 mg/dL (ref 8.4–10.5)
Chloride: 106 mEq/L (ref 96–112)
GFR: 80.68 mL/min (ref >60.00)
GLUCOSE: 81 mg/dL (ref 70–99)
POTASSIUM: 4.2 meq/L (ref 3.5–5.1)
SODIUM: 141 meq/L (ref 135–145)
TOTAL PROTEIN: 7.3 g/dL (ref 6.0–8.3)
Total Bilirubin: 0.6 mg/dL (ref 0.2–1.2)

## 2018-04-17 LAB — CBC WITH DIFFERENTIAL/PLATELET
BASOS ABS: 0 10*3/uL (ref 0.0–0.1)
Basophils Relative: 0.6 % (ref 0.0–3.0)
EOS ABS: 0.2 10*3/uL (ref 0.0–0.7)
Eosinophils Relative: 3.6 % (ref 0.0–5.0)
HCT: 43.6 % (ref 39.0–52.0)
HEMOGLOBIN: 14.8 g/dL (ref 13.0–17.0)
Lymphocytes Relative: 30.4 % (ref 12.0–46.0)
Lymphs Abs: 1.8 10*3/uL (ref 0.7–4.0)
MCHC: 33.8 g/dL (ref 30.0–36.0)
MCV: 93.5 fl (ref 78.0–100.0)
MONO ABS: 0.6 10*3/uL (ref 0.1–1.0)
Monocytes Relative: 9.7 % (ref 3.0–12.0)
NEUTROS PCT: 55.7 % (ref 43.0–77.0)
Neutro Abs: 3.4 10*3/uL (ref 1.4–7.7)
Platelets: 230 10*3/uL (ref 150.0–400.0)
RBC: 4.67 Mil/uL (ref 4.22–5.81)
RDW: 13 % (ref 11.5–15.5)
WBC: 6.1 10*3/uL (ref 4.0–10.5)

## 2018-04-17 LAB — LIPID PANEL
Cholesterol: 135 mg/dL (ref 0–200)
HDL: 52.3 mg/dL (ref >39.00)
LDL Cholesterol: 64 mg/dL (ref 0–99)
NONHDL: 82.87
Total CHOL/HDL Ratio: 3
Triglycerides: 93 mg/dL (ref 0.0–149.0)
VLDL: 18.6 mg/dL (ref 0.0–40.0)

## 2018-04-17 NOTE — Patient Instructions (Signed)
[name] , Thank you for taking time to come for your Medicare Wellness Visit. I appreciate your ongoing commitment to your health goals. Please review the following plan we discussed and let me know if I can assist you in the future.   These are the goals we discussed: Goals    . Follow up with Primary Care Provider     Starting 04/17/2018, I will continue to take medications as prescribed and to keep appointments with PCP as scheduled.        This is a list of the screening recommended for you and due dates:  Health Maintenance  Topic Date Due  . DTaP/Tdap/Td vaccine (1 - Tdap) 02/22/2020*  . Tetanus Vaccine  02/22/2020*  . Flu Shot  Completed  . Pneumonia vaccines  Completed  *Topic was postponed. The date shown is not the original due date.   Preventive Care for Adults  A healthy lifestyle and preventive care can promote health and wellness. Preventive health guidelines for adults include the following key practices.  . A routine yearly physical is a good way to check with your health care provider about your health and preventive screening. It is a chance to share any concerns and updates on your health and to receive a thorough exam.  . Visit your dentist for a routine exam and preventive care every 6 months. Brush your teeth twice a day and floss once a day. Good oral hygiene prevents tooth decay and gum disease.  . The frequency of eye exams is based on your age, health, family medical history, use  of contact lenses, and other factors. Follow your health care provider's recommendations for frequency of eye exams.  . Eat a healthy diet. Foods like vegetables, fruits, whole grains, low-fat dairy products, and lean protein foods contain the nutrients you need without too many calories. Decrease your intake of foods high in solid fats, added sugars, and salt. Eat the right amount of calories for you. Get information about a proper diet from your health care provider, if  necessary.  . Regular physical exercise is one of the most important things you can do for your health. Most adults should get at least 150 minutes of moderate-intensity exercise (any activity that increases your heart rate and causes you to sweat) each week. In addition, most adults need muscle-strengthening exercises on 2 or more days a week.  Silver Sneakers may be a benefit available to you. To determine eligibility, you may visit the website: www.silversneakers.com or contact program at [phone] Mon-Fri between 8AM-8PM.   . Maintain a healthy weight. The body mass index (BMI) is a screening tool to identify possible weight problems. It provides an estimate of body fat based on height and weight. Your health care provider can find your BMI and can help you achieve or maintain a healthy weight.   For adults 20 years and older: ? A BMI below 18.5 is considered underweight. ? A BMI of 18.5 to 24.9 is normal. ? A BMI of 25 to 29.9 is considered overweight. ? A BMI of 30 and above is considered obese.   . Maintain normal blood lipids and cholesterol levels by exercising and minimizing your intake of saturated fat. Eat a balanced diet with plenty of fruit and vegetables. Blood tests for lipids and cholesterol should begin at age 82 and be repeated every 5 years. If your lipid or cholesterol levels are high, you are over 50, or you are at high risk for heart disease,  you may need your cholesterol levels checked more frequently. Ongoing high lipid and cholesterol levels should be treated with medicines if diet and exercise are not working.  . If you smoke, find out from your health care provider how to quit. If you do not use tobacco, please do not start.  . If you choose to drink alcohol, please do not consume more than 2 drinks per day. One drink is considered to be 12 ounces (355 mL) of beer, 5 ounces (148 mL) of wine, or 1.5 ounces (44 mL) of liquor.  . If you are 12-18 years old, ask your  health care provider if you should take aspirin to prevent strokes.  . Use sunscreen. Apply sunscreen liberally and repeatedly throughout the day. You should seek shade when your shadow is shorter than you. Protect yourself by wearing long sleeves, pants, a wide-brimmed hat, and sunglasses year round, whenever you are outdoors.  . Once a month, do a whole body skin exam, using a mirror to look at the skin on your back. Tell your health care provider of new moles, moles that have irregular borders, moles that are larger than a pencil eraser, or moles that have changed in shape or color.

## 2018-04-17 NOTE — Progress Notes (Signed)
Subjective:   [name] is a 85 y.o. male who presents for Medicare Annual/Subsequent preventive examination.  Review of Systems:  N/A Cardiac Risk Factors include: male gender;advanced age (>67men, >12 women);dyslipidemia;hypertension     Objective:    Vitals: BP 116/70 (BP Location: Right Arm, Patient Position: Sitting, Cuff Size: Normal)   Pulse 73   Temp 97.8 F (36.6 C) (Oral)   Ht 5\' 6"  (1.676 m) Comment: shoes  Wt 162 lb 12.8 oz (73.8 kg)   SpO2 99%   BMI 26.28 kg/m   Body mass index is 26.28 kg/m.  Advanced Directives 04/17/2018 04/15/2017 04/01/2016 03/25/2015  Does Patient Have a Medical Advance Directive? Yes Yes Yes Yes  Type of Paramedic of [place];Living will [place];Living will [place];Living will [place];Living will  Does patient want to make changes to medical advance directive? - - - No - Patient declined  Copy of [place] in Chart? Yes - validated most recent copy scanned in chart (See row information) Yes Yes Yes    Tobacco Social History   Tobacco Use  Smoking Status Former Smoker  . Types: Cigarettes  Smokeless Tobacco Never Used     Counseling given: No   Clinical Intake:  Pre-visit preparation completed: Yes  Pain : No/denies pain Pain Score: 0-No pain     Nutritional Status: BMI 25 -29 Overweight Nutritional Risks: None Diabetes: No  How often do you need to have someone help you when you read instructions, pamphlets, or other written materials from your doctor or pharmacy?: 1 - Never  Interpreter Needed?: No  Comments: pt lives with spouse Information entered by :: LPinson, LPN  Past Medical History:  Diagnosis Date  . Allergic rhinitis   . BPH (benign prostatic hypertrophy) with urinary obstruction 2000   40gm, L lobe 78mm prostate nodule; followed yearly by Dr Diona Fanti  . GERD (gastroesophageal reflux disease)   .  Glaucoma   . Herpes zoster ophthalmicus    history  . History of kidney stones remote   x 1-Ca monhydrate, hyperoxalate urine s/p lithotripsy  . HLD (hyperlipidemia)   . HTN (hypertension)   . Impaired glucose tolerance   . Impaired hearing    uses hearing aides bilaterally  . Osteopenia    Past Surgical History:  Procedure Laterality Date  . APPENDECTOMY  1997  . CATARACT EXTRACTION Right 11/1013   Dr. Venetia Maxon  . COLONOSCOPY  2002   WNL-[facility]; repeat 2012  . COLONOSCOPY  02/2012   tubular adenoma Deatra Ina)  . [place]   left-1955; right-1970  . LITHOTRIPSY  2005  . SBO  2008, 2010   Family History  Problem Relation Age of Onset  . Coronary artery disease Father   . Healthy Mother   . Diabetes Brother   . Breast cancer Sister   . Leukemia Maternal Uncle   . Pancreatic cancer Sister    Social History   Socioeconomic History  . Marital status: Married    Spouse name: Not on file  . Number of children: 2  . Years of education: Not on file  . Highest education level: Not on file  Occupational History  . Occupation: Marine scientist: retired  Scientific laboratory technician  . Financial resource strain: Not on file  . Food insecurity:    Worry: Not on file    Inability: Not on file  . Transportation needs:  Medical: Not on file    Non-medical: Not on file  Tobacco Use  . Smoking status: Former Smoker    Types: Cigarettes  . Smokeless tobacco: Never Used  Substance and Sexual Activity  . Alcohol use: No  . Drug use: No  . Sexual activity: Never  Lifestyle  . Physical activity:    Days per week: Not on file    Minutes per session: Not on file  . Stress: Not on file  Relationships  . Social connections:    Talks on phone: Not on file    Gets together: Not on file    Attends religious service: Not on file    Active member of club or organization: Not on file    Attends meetings of clubs or organizations: Not on file    Relationship  status: Not on file  Other Topics Concern  . Not on file  Social History Narrative   Minimal caffeine   Lives with wife, [name]. No pets   Nutritional therapist; working for [employer], quantity currently; retired from [employer]   Edu: 12th grade education   Activity: stays active outside   Diet: good water, fruits/vegetables daily    Outpatient Encounter Medications as of 04/17/2018  Medication Sig  . acetaminophen (TYLENOL) 500 MG tablet Take 500 mg by mouth 2 (two) times daily as needed.  Marland Kitchen aspirin EC 81 MG tablet Take 81 mg by mouth daily.  Marland Kitchen atorvastatin (LIPITOR) 10 MG tablet TAKE 1/2 TABLET BY MOUTH DAILY  . b complex vitamins capsule Take 1 capsule by mouth daily.  . cholecalciferol (VITAMIN D) 1000 UNITS tablet Take 1,000 Units by mouth daily.  Marland Kitchen docusate sodium (COLACE) 100 MG capsule Take 100 mg by mouth daily.  Marland Kitchen donepezil (ARICEPT) 10 MG tablet Take 1 tablet (10 mg total) by mouth at bedtime.  . dorzolamide-timolol (COSOPT) 22.3-6.8 MG/ML ophthalmic solution Place 1 drop into both eyes 2 (two) times daily.    . fexofenadine (ALLEGRA) 180 MG tablet Take 180 mg by mouth daily.    Marland Kitchen latanoprost (XALATAN) 0.005 % ophthalmic solution Place 1 drop into both eyes at bedtime.    Marland Kitchen losartan (COZAAR) 100 MG tablet TAKE 1 TABLET BY MOUTH DAILY  . Multiple Vitamins-Minerals (MENS MULTI VITAMIN & MINERAL) TABS Take 1 tablet by mouth daily.    . Polyvinyl Alcohol-Povidone (REFRESH OP) Apply to eye 2 (two) times daily.  . TRAVATAN Z 0.004 % SOLN ophthalmic solution   . valsartan (DIOVAN) 160 MG tablet Take 1 tablet (160 mg total) by mouth daily.   No facility-administered encounter medications on file as of 04/17/2018.     Activities of Daily Living In your present state of health, do you have any difficulty performing the following activities: 04/17/2018  Hearing? Y  Vision? N  Difficulty concentrating or making decisions? Y  Walking or climbing stairs? Y  Dressing or bathing? N  Doing errands, shopping? N    Preparing Food and eating ? N  Using the Toilet? N  In the past six months, have you accidently leaked urine? N  Do you have problems with loss of bowel control? N  Managing your Medications? N  Managing your Finances? N  Housekeeping or managing your Housekeeping? N  Some recent data might be hidden    Patient Care Team: [name], MD as PCP - General (Family Medicine) Marylynn Pearson, MD as Consulting Physician (Ophthalmology) Franchot Gallo, MD as Consulting Physician (Urology) Lavella Hammock, DDS as Consulting Physician (Dentistry)  Assessment:   This is a routine wellness examination for [place].  Hearing Screening Comments: Bilateral hearing aids Vision Screening Comments: Vision exam in 2020   Exercise Activities and Dietary recommendations Current Exercise Habits: The patient does not participate in regular exercise at present, Exercise limited by: None identified  Goals    . Follow up with Primary Care Provider     Starting 04/17/2018, I will continue to take medications as prescribed and to keep appointments with PCP as scheduled.        Fall Risk Fall Risk  04/17/2018 04/15/2017 04/01/2016 03/25/2015 03/20/2014  Falls in the past year? 0 No No No Yes  Number falls in past yr: - - - - 1  Comment - - - - Slipped on the ice 03/19/14  Injury with Fall? - - - - No    Depression Screen PHQ 2/9 Scores 04/17/2018 04/15/2017 04/01/2016 03/25/2015  PHQ - 2 Score 0 0 0 0  PHQ- 9 Score 0 0 - -    Cognitive Function MMSE - Mini Mental State Exam 04/17/2018 04/15/2017 04/01/2016 03/25/2015  Not completed: (No Data) - - -  Orientation to time 0 0 5 5  Orientation to time comments disoriented to time - - -  Orientation to Place 5 5 5 5   Registration 3 3 3  -  Attention/ Calculation 0 0 0 5  Recall 2 0 0 0  Recall-comments unable to recall 1 of 3 words - pt was unable to recall 3 of 3 words -  Language- name 2 objects 0 0 0 -  Language- repeat 1 1 1 1   Language- follow 3  step command 2 3 2  -  Language- follow 3 step command-comments unable to complete 1 step of 3 step command - pt was unable to follow 1 of 3 step command -  Language- read & follow direction 0 0 0 -  Write a sentence 0 0 0 -  Copy design 0 0 0 -  Total score 13 12 16  -     PLEASE NOTE: A Mini-Cog screen was completed. Maximum score is 20. A value of 0 denotes this part of [place] MMSE was not completed or the patient failed this part of the Mini-Cog screening.   Mini-Cog Screening Orientation to Time - Max 5 pts Orientation to Place - Max 5 pts Registration - Max 3 pts Recall - Max 3 pts Language Repeat - Max 1 pts Language Follow 3 Step Command - Max 3 pts     Immunization History  Administered Date(s) Administered  . Influenza Whole 12/06/2009, 11/29/2011  . Influenza, High Dose Seasonal PF 12/15/2016  . Influenza,inj,Quad PF,6+ Mos 11/02/2012, 11/05/2013, 11/11/2014, 11/12/2015, 01/04/2018  . Pneumococcal Conjugate-13 03/20/2014  . Pneumococcal Polysaccharide-23 07/23/2005  . Td 02/22/2002  . Zoster 10/01/2010    Screening Tests Health Maintenance  Topic Date Due  . DTaP/Tdap/Td (1 - Tdap) 02/22/2020 (Originally 05/09/1939)  . TETANUS/TDAP  02/22/2020 (Originally 02/23/2012)  . INFLUENZA VACCINE  Completed  . PNA vac Low Risk Adult  Completed       Plan:     I have personally reviewed, addressed, and noted the following in the patient's chart:  A. Medical and social history B. Use of alcohol, tobacco or illicit drugs  C. Current medications and supplements D. Functional ability and status E.  Nutritional status F.  Physical activity G. Advance directives H. List of other physicians I.  Hospitalizations, surgeries, and ER visits in previous 12 months J.  Vitals K. Screenings to include hearing, vision, cognitive, depression L. Referrals and appointments - none  In addition, I have reviewed and discussed with patient certain preventive protocols, quality metrics,  and best practice recommendations. A written personalized care plan for preventive services as well as general preventive health recommendations were provided to patient.  See attached scanned questionnaire for additional information.   Signed,   Lindell Noe, MHA, BS, LPN Health Coach

## 2018-04-17 NOTE — Progress Notes (Signed)
PCP notes:   Health maintenance:  Tetanus vaccine - postponed/insurance  Abnormal screenings:   Mini-Cog score:13/20 MMSE - Mini Mental State Exam 04/17/2018 04/15/2017 04/01/2016 03/25/2015  Not completed: (No Data) - - -  Orientation to time 0 0 5 5  Orientation to time comments disoriented to time - - -  Orientation to Place 5 5 5 5   Registration 3 3 3  -  Attention/ Calculation 0 0 0 5  Recall 2 0 0 0  Recall-comments unable to recall 1 of 3 words - pt was unable to recall 3 of 3 words -  Language- name 2 objects 0 0 0 -  Language- repeat 1 1 1 1   Language- follow 3 step command 2 3 2  -  Language- follow 3 step command-comments unable to complete 1 step of 3 step command - pt was unable to follow 1 of 3 step command -  Language- read & follow direction 0 0 0 -  Write a sentence 0 0 0 -  Copy design 0 0 0 -  Total score 13 12 16  -     Patient concerns:   None  Nurse concerns:  None  Next PCP appt:   04/20/18 @ 1130

## 2018-04-20 ENCOUNTER — Encounter: Payer: Self-pay | Admitting: Family Medicine

## 2018-04-20 ENCOUNTER — Ambulatory Visit (INDEPENDENT_AMBULATORY_CARE_PROVIDER_SITE_OTHER): Payer: Medicare Other | Admitting: Family Medicine

## 2018-04-20 VITALS — BP 122/66 | HR 73 | Temp 97.6°F | Ht 66.0 in | Wt 163.2 lb

## 2018-04-20 DIAGNOSIS — N402 Nodular prostate without lower urinary tract symptoms: Secondary | ICD-10-CM | POA: Diagnosis not present

## 2018-04-20 DIAGNOSIS — H9193 Unspecified hearing loss, bilateral: Secondary | ICD-10-CM | POA: Diagnosis not present

## 2018-04-20 DIAGNOSIS — M419 Scoliosis, unspecified: Secondary | ICD-10-CM | POA: Diagnosis not present

## 2018-04-20 DIAGNOSIS — G3184 Mild cognitive impairment, so stated: Secondary | ICD-10-CM

## 2018-04-20 DIAGNOSIS — I10 Essential (primary) hypertension: Secondary | ICD-10-CM | POA: Diagnosis not present

## 2018-04-20 DIAGNOSIS — N401 Enlarged prostate with lower urinary tract symptoms: Secondary | ICD-10-CM

## 2018-04-20 DIAGNOSIS — E785 Hyperlipidemia, unspecified: Secondary | ICD-10-CM

## 2018-04-20 DIAGNOSIS — N138 Other obstructive and reflux uropathy: Secondary | ICD-10-CM

## 2018-04-20 MED ORDER — DONEPEZIL HCL 10 MG PO TABS: 10.0000 mg | ORAL_TABLET | Freq: Every day | ORAL | 3 refills | Status: DC

## 2018-04-20 MED ORDER — ATORVASTATIN CALCIUM 10 MG PO TABS: 5.0000 mg | ORAL_TABLET | Freq: Every day | ORAL | 3 refills | Status: DC

## 2018-04-20 NOTE — Patient Instructions (Addendum)
Continue aricept for memory. Consider second memory medicine to help (called namenda).  If interested, check with pharmacy about new 2 shot shingles series (shingrix).  You are doing well today. Increase water instead of caffeinated beverages.  Return as needed or in 1 year for next check up.   Health Maintenance After Age 83 After age 79, you are at a higher risk for certain long-term diseases and infections as well as injuries from falls. Falls are a major cause of broken bones and head injuries in people who are older than age 89. Getting regular preventive care can help to keep you healthy and well. Preventive care includes getting regular testing and making lifestyle changes as recommended by your health care provider. Talk with your health care provider about:  Which screenings and tests you should have. A screening is a test that checks for a disease when you have no symptoms.  A diet and exercise plan that is right for you. What should I know about screenings and tests to prevent falls? Screening and testing are the best ways to find a health problem early. Early diagnosis and treatment give you the best chance of managing medical conditions that are common after age 55. Certain conditions and lifestyle choices may make you more likely to have a fall. Your health care provider may recommend:  Regular vision checks. Poor vision and conditions such as cataracts can make you more likely to have a fall. If you wear glasses, make sure to get your prescription updated if your vision changes.  Medicine review. Work with your health care provider to regularly review all of the medicines you are taking, including over-the-counter medicines. Ask your health care provider about any side effects that may make you more likely to have a fall. Tell your health care provider if any medicines that you take make you feel dizzy or sleepy.  Osteoporosis screening. Osteoporosis is a condition that causes the  bones to get weaker. This can make the bones weak and cause them to break more easily.  Blood pressure screening. Blood pressure changes and medicines to control blood pressure can make you feel dizzy.  Strength and balance checks. Your health care provider may recommend certain tests to check your strength and balance while standing, walking, or changing positions.  Foot health exam. Foot pain and numbness, as well as not wearing proper footwear, can make you more likely to have a fall.  Depression screening. You may be more likely to have a fall if you have a fear of falling, feel emotionally low, or feel unable to do activities that you used to do.  Alcohol use screening. Using too much alcohol can affect your balance and may make you more likely to have a fall. What actions can I take to lower my risk of falls? General instructions  Talk with your health care provider about your risks for falling. Tell your health care provider if: ? You fall. Be sure to tell your health care provider about all falls, even ones that seem minor. ? You feel dizzy, sleepy, or off-balance.  Take over-the-counter and prescription medicines only as told by your health care provider. These include any supplements.  Eat a healthy diet and maintain a healthy weight. A healthy diet includes low-fat dairy products, low-fat (lean) meats, and fiber from whole grains, beans, and lots of fruits and vegetables. Home safety  Remove any tripping hazards, such as rugs, cords, and clutter.  Install safety equipment such as grab bars  in bathrooms and safety rails on stairs.  Keep rooms and walkways well-lit. Activity   Follow a regular exercise program to stay fit. This will help you maintain your balance. Ask your health care provider what types of exercise are appropriate for you.  If you need a cane or walker, use it as recommended by your health care provider.  Wear supportive shoes that have nonskid  soles. Lifestyle  Do not drink alcohol if your health care provider tells you not to drink.  If you drink alcohol, limit how much you have: ? 0-1 drink a day for women. ? 0-2 drinks a day for men.  Be aware of how much alcohol is in your drink. In the [place] one drink equals one typical bottle of beer (12 oz), one-half glass of wine (5 oz), or one shot of hard liquor (1 oz).  Do not use any products that contain nicotine or tobacco, such as cigarettes and e-cigarettes. If you need help quitting, ask your health care provider. Summary  Having a healthy lifestyle and getting preventive care can help to protect your health and wellness after age 47.  Screening and testing are the best way to find a health problem early and help you avoid having a fall. Early diagnosis and treatment give you the best chance for managing medical conditions that are more common for people who are older than age 61.  Falls are a major cause of broken bones and head injuries in people who are older than age 71. Take precautions to prevent a fall at home.  Work with your health care provider to learn what changes you can make to improve your health and wellness and to prevent falls. This information is not intended to replace advice given to you by your health care provider. Make sure you discuss any questions you have with your health care provider. Document Released: 12/22/2016 Document Revised: 12/22/2016 Document Reviewed: 12/22/2016 Elsevier Interactive Patient Education  2019 [employer].

## 2018-04-20 NOTE — Assessment & Plan Note (Signed)
Followed by Andreas Newport ([place])

## 2018-04-20 NOTE — Assessment & Plan Note (Signed)
Continue use of hearing aides

## 2018-04-20 NOTE — Assessment & Plan Note (Signed)
Chronic, stable on current regimen.  

## 2018-04-20 NOTE — Assessment & Plan Note (Signed)
Chronic, stable. Continue low dose statin.

## 2018-04-20 NOTE — Progress Notes (Signed)
BP 122/66 (BP Location: Left Arm, Patient Position: Sitting, Cuff Size: Normal)   Pulse 73   Temp 97.6 F (36.4 C) (Oral)   Ht 5\' 6"  (1.676 m)   Wt 163 lb 3 oz (74 kg)   SpO2 100%   BMI 26.34 kg/m    CC: AMW f/u visit Subjective:    Patient ID: [name], male    DOB: June 20, 1928, 85 y.o.   MRN: [id number]  HPI: [name] is a 85 y.o. male presenting on 04/20/2018 for Annual Exam (Pt 2. Pt accompanied by his wife, [name]. )    Annia Belt this week for medicare wellness visit. Note reviewed. Failed minicog test (13/20). MMSE last year 22/30 (03/2017). Aricept started last year. 12/28 celebrated 68th wedding anniversary.   Minimal constipation.  No urinary incontinence. No change in LOC.   Preventative: Colonoscopy 02/2012, tubular adenoma. (Dr Deatra Ina). No fmhx colon cancer. Age out. Prostate -h/o prostate nodule.Checked by Dr Dahlstedtyearly.No fmhx prostate cancer.  Flu shotyearly Pneumovax 2007. prevnar -2016 Tetanus 2004.  zostavax - 2012.  shingrix - discussed Living will - have at home,living will in chart (02/2013) but no formal HCPOA form.Children know, 2 children are co-HCPOA and co-beneficiaries. Would desire temporary life support but not prolonged. Seat belt use discussed Sunscreen use discussed. No changing moles on skin.  Non smoker Alcohol - none  Dentist yearly Eye exam yearly  Minimal caffeine Lives with wife, [name]. No pets Nutritional therapist; working for [employer], [employer] currently; retired from [employer]: 12th grade education  Activity: stays active in yard  Diet: good water, fruits/vegetables daily      Relevant past medical, surgical, family and social history reviewed and updated as indicated. Interim medical history since our last visit reviewed. Allergies and medications reviewed and updated. Outpatient Medications Prior to Visit  Medication Sig Dispense Refill  . acetaminophen (TYLENOL) 500 MG tablet Take 500 mg by mouth 2 (two) times  daily as needed.    Marland Kitchen aspirin EC 81 MG tablet Take 81 mg by mouth daily.    Marland Kitchen b complex vitamins capsule Take 1 capsule by mouth daily.    . cholecalciferol (VITAMIN D) 1000 UNITS tablet Take 1,000 Units by mouth daily.    Marland Kitchen docusate sodium (COLACE) 100 MG capsule Take 100 mg by mouth daily.    . dorzolamide-timolol (COSOPT) 22.3-6.8 MG/ML ophthalmic solution Place 1 drop into both eyes 2 (two) times daily.      . fexofenadine (ALLEGRA) 180 MG tablet Take 180 mg by mouth daily.      Marland Kitchen latanoprost (XALATAN) 0.005 % ophthalmic solution Place 1 drop into both eyes at bedtime.      Marland Kitchen losartan (COZAAR) 100 MG tablet TAKE 1 TABLET BY MOUTH DAILY 90 tablet 3  . Multiple Vitamins-Minerals (MENS MULTI VITAMIN & MINERAL) TABS Take 1 tablet by mouth daily.      . Polyvinyl Alcohol-Povidone (REFRESH OP) Apply to eye 2 (two) times daily.    . TRAVATAN Z 0.004 % SOLN ophthalmic solution     . valsartan (DIOVAN) 160 MG tablet Take 1 tablet (160 mg total) by mouth daily. 30 tablet 3  . atorvastatin (LIPITOR) 10 MG tablet TAKE 1/2 TABLET BY MOUTH DAILY 45 tablet 3  . donepezil (ARICEPT) 10 MG tablet Take 1 tablet (10 mg total) by mouth at bedtime. 90 tablet 1   No facility-administered medications prior to visit.      Per HPI unless specifically indicated in ROS section below Review  of Systems Objective:    BP 122/66 (BP Location: Left Arm, Patient Position: Sitting, Cuff Size: Normal)   Pulse 73   Temp 97.6 F (36.4 C) (Oral)   Ht 5\' 6"  (1.676 m)   Wt 163 lb 3 oz (74 kg)   SpO2 100%   BMI 26.34 kg/m   Wt Readings from Last 3 Encounters:  04/20/18 163 lb 3 oz (74 kg)  04/17/18 162 lb 12.8 oz (73.8 kg)  10/19/17 173 lb 4 oz (78.6 kg)    Physical Exam Vitals signs and nursing note reviewed.  Constitutional:      General: He is not in acute distress.    Appearance: Normal appearance. He is well-developed.  HENT:     Head: Normocephalic and atraumatic.     Right Ear: Hearing, tympanic  membrane, ear canal and external ear normal. There is no impacted cerumen.     Left Ear: Hearing, tympanic membrane, ear canal and external ear normal. There is no impacted cerumen.     Ears:     Comments: Uses hearing aides    Nose: Nose normal.     Mouth/Throat:     Mouth: Mucous membranes are moist.     Pharynx: Uvula midline. No oropharyngeal exudate or posterior oropharyngeal erythema.  Eyes:     General: No scleral icterus.    Conjunctiva/sclera: Conjunctivae normal.     Pupils: Pupils are equal, round, and reactive to light.  Neck:     Musculoskeletal: Normal range of motion and neck supple.     Vascular: No carotid bruit.  Cardiovascular:     Rate and Rhythm: Normal rate and regular rhythm.     Pulses: Normal pulses.          Radial pulses are 2+ on the right side and 2+ on the left side.     Heart sounds: Normal heart sounds. No murmur.  Pulmonary:     Effort: Pulmonary effort is normal. No respiratory distress.     Breath sounds: Normal breath sounds. No wheezing, rhonchi or rales.  Abdominal:     General: Abdomen is flat. Bowel sounds are normal. There is no distension.     Palpations: Abdomen is soft. There is no mass.     Tenderness: There is no abdominal tenderness. There is no guarding or rebound.  Musculoskeletal: Normal range of motion.  Lymphadenopathy:     Cervical: No cervical adenopathy.  Skin:    General: Skin is warm and dry.     Findings: No rash.  Neurological:     Mental Status: He is alert and oriented to person, place, and time.     Comments: CN grossly intact, station and gait intact  Psychiatric:        Mood and Affect: Mood normal.        Behavior: Behavior normal.        Thought Content: Thought content normal.        Judgment: Judgment normal.       Results for orders placed or performed in visit on 04/17/18  CBC with Differential/Platelet  Result Value Ref Range   WBC 6.1 4.0 - 10.5 K/uL   RBC 4.67 4.22 - 5.81 Mil/uL   Hemoglobin 14.8  13.0 - 17.0 g/dL   HCT 43.6 39.0 - 52.0 %   MCV 93.5 78.0 - 100.0 fl   MCHC 33.8 30.0 - 36.0 g/dL   RDW 13.0 11.5 - 15.5 %   Platelets 230.0 150.0 - 400.0 K/uL  Neutrophils Relative % 55.7 43.0 - 77.0 %   Lymphocytes Relative 30.4 12.0 - 46.0 %   Monocytes Relative 9.7 3.0 - 12.0 %   Eosinophils Relative 3.6 0.0 - 5.0 %   Basophils Relative 0.6 0.0 - 3.0 %   Neutro Abs 3.4 1.4 - 7.7 K/uL   Lymphs Abs 1.8 0.7 - 4.0 K/uL   Monocytes Absolute 0.6 0.1 - 1.0 K/uL   Eosinophils Absolute 0.2 0.0 - 0.7 K/uL   Basophils Absolute 0.0 0.0 - 0.1 K/uL  Comprehensive metabolic panel  Result Value Ref Range   Sodium 141 135 - 145 mEq/L   Potassium 4.2 3.5 - 5.1 mEq/L   Chloride 106 96 - 112 mEq/L   CO2 28 19 - 32 mEq/L   Glucose, Bld 81 70 - 99 mg/dL   BUN 26 (H) 6 - 23 mg/dL   Creatinine, Ser 0.89 0.40 - 1.50 mg/dL   Total Bilirubin 0.6 0.2 - 1.2 mg/dL   Alkaline Phosphatase 54 39 - 117 U/L   AST 23 0 - 37 U/L   ALT 23 0 - 53 U/L   Total Protein 7.3 6.0 - 8.3 g/dL   Albumin 4.4 3.5 - 5.2 g/dL   Calcium 9.8 8.4 - 10.5 mg/dL   GFR 80.68 >60.00 mL/min  Lipid panel  Result Value Ref Range   Cholesterol 135 0 - 200 mg/dL   Triglycerides 93.0 0.0 - 149.0 mg/dL   HDL 52.30 >39.00 mg/dL   VLDL 18.6 0.0 - 40.0 mg/dL   LDL Cholesterol 64 0 - 99 mg/dL   Total CHOL/HDL Ratio 3    NonHDL 82.87    Assessment & Plan:   Problem List Items Addressed This Visit    Prostate nodule    Followed by uro (Dahlstedt)      MCI (mild cognitive impairment) with memory loss - Primary    Stable period on aricept 10mg  daily without noted change in LOC. Discussed addition of namenda. Will not make changes at this time.      Lumbar scoliosis   Hearing loss    Continue use of hearing aides      Essential hypertension    Chronic, stable on current regimen.       Relevant Medications   atorvastatin (LIPITOR) 10 MG tablet   Dyslipidemia    Chronic, stable. Continue low dose statin.       Relevant  Medications   atorvastatin (LIPITOR) 10 MG tablet   Benign prostatic hyperplasia with urinary obstruction    Sees uro yearly. Appreciate their care.           Meds ordered this encounter  Medications  . donepezil (ARICEPT) 10 MG tablet    Sig: Take 1 tablet (10 mg total) by mouth at bedtime.    Dispense:  90 tablet    Refill:  3  . atorvastatin (LIPITOR) 10 MG tablet    Sig: Take 0.5 tablets (5 mg total) by mouth daily.    Dispense:  45 tablet    Refill:  3   No orders of the defined types were placed in this encounter.   Follow up plan: Return in about 1 year (around 04/21/2019) for medicare wellness visit, follow up visit.  [name], MD

## 2018-04-20 NOTE — ACP (Advance Care Planning) (Signed)
Living will - have at home, living will in chart (02/2013) but no formal HCPOA form. Children know, 2 children are co-HC POA and co-beneficiaries. Would desire temporary life support but not prolonged.

## 2018-04-20 NOTE — Assessment & Plan Note (Signed)
Stable period on aricept 10mg  daily without noted change in LOC. Discussed addition of namenda. Will not make changes at this time.

## 2018-04-20 NOTE — Assessment & Plan Note (Addendum)
Sees uro yearly. Appreciate their care.

## 2018-06-04 NOTE — Progress Notes (Signed)
I reviewed health advisor's note, was available for consultation, and agree with documentation and plan.  

## 2018-06-27 ENCOUNTER — Other Ambulatory Visit: Payer: Self-pay | Admitting: Family Medicine

## 2018-10-04 DIAGNOSIS — N402 Nodular prostate without lower urinary tract symptoms: Secondary | ICD-10-CM | POA: Diagnosis not present

## 2018-10-04 DIAGNOSIS — N400 Enlarged prostate without lower urinary tract symptoms: Secondary | ICD-10-CM | POA: Diagnosis not present

## 2018-10-04 DIAGNOSIS — N200 Calculus of kidney: Secondary | ICD-10-CM | POA: Diagnosis not present

## 2018-12-27 ENCOUNTER — Ambulatory Visit (INDEPENDENT_AMBULATORY_CARE_PROVIDER_SITE_OTHER): Payer: Medicare Other

## 2018-12-27 DIAGNOSIS — Z23 Encounter for immunization: Secondary | ICD-10-CM | POA: Diagnosis not present

## 2019-02-05 ENCOUNTER — Ambulatory Visit (INDEPENDENT_AMBULATORY_CARE_PROVIDER_SITE_OTHER)
Admission: RE | Admit: 2019-02-05 | Discharge: 2019-02-05 | Disposition: A | Discharge status: Home / Self Care | Payer: Medicare Other | Source: Ambulatory Visit | Attending: Family Medicine | Admitting: Family Medicine

## 2019-02-05 ENCOUNTER — Encounter: Payer: Self-pay | Admitting: Family Medicine

## 2019-02-05 ENCOUNTER — Ambulatory Visit (INDEPENDENT_AMBULATORY_CARE_PROVIDER_SITE_OTHER): Payer: Medicare Other | Admitting: Family Medicine

## 2019-02-05 ENCOUNTER — Other Ambulatory Visit: Payer: Self-pay

## 2019-02-05 VITALS — BP 120/68 | HR 83 | Temp 97.3°F | Ht 66.0 in | Wt 164.2 lb

## 2019-02-05 DIAGNOSIS — M545 Low back pain, unspecified: Secondary | ICD-10-CM

## 2019-02-05 DIAGNOSIS — M419 Scoliosis, unspecified: Secondary | ICD-10-CM

## 2019-02-05 LAB — POC URINALSYSI DIPSTICK (AUTOMATED)
Bilirubin, UA: NEGATIVE
Blood, UA: NEGATIVE
Glucose, UA: NEGATIVE
Ketones, UA: NEGATIVE
Leukocytes, UA: NEGATIVE
Nitrite, UA: NEGATIVE
Protein, UA: NEGATIVE
Spec Grav, UA: >=1.03 — AB (ref 1.010–1.025)
Urobilinogen, UA: 0.2 E.U./dL
pH, UA: 5.5 (ref 5.0–8.0)

## 2019-02-05 MED ORDER — ACETAMINOPHEN 500 MG PO TABS: 1000.0000 mg | ORAL_TABLET | Freq: Every day | ORAL | Status: DC

## 2019-02-05 NOTE — Assessment & Plan Note (Addendum)
Anticipate progression of lumbar DDD/spondylosis - update lumbar films today. No red flags, no focal findings noted. rec scheduled tylenol 1000mg  nightly, with second dose during day PRN, provided with stretching exercises from [facility] pt advisor.

## 2019-02-05 NOTE — Assessment & Plan Note (Signed)
Noted on exam ° °

## 2019-02-05 NOTE — Progress Notes (Signed)
This visit was conducted in person.  BP 120/68 (BP Location: Left Arm, Patient Position: Sitting, Cuff Size: Normal)   Pulse 83   Temp (!) 97.3 F (36.3 C) (Temporal)   Ht 5\' 6"  (1.676 m)   Wt 164 lb 3 oz (74.5 kg)   SpO2 96%   BMI 26.50 kg/m    CC: low back pain Subjective:    Patient ID: [name], male    DOB: 1928-06-02, 85 y.o.   MRN: [id number]  HPI: [name] is a 85 y.o. male presenting on 02/05/2019 for Back Pain (C/o low back pain. Started weeks ago, worsened.  Pt accompanied by wife, [name] [97.5].  Tried Tylenol, helpful. )   Several week of lower back pain, progressively worsening. Denies inciting trauma/injury or falls. Denies shooting pain down legs, numbness or weakness of legs. Denies bowel/bladder incontinence. He has told wife that lower legs hurt at night time "mild pain". Has been treating back pain with tylenol 1000mg  with benefit. Denies fevers/chills, abd pain, diarrhea, dysuria.   Hard of hearing - wife helps answer questions.     Relevant past medical, surgical, family and social history reviewed and updated as indicated. Interim medical history since our last visit reviewed. Allergies and medications reviewed and updated. Outpatient Medications Prior to Visit  Medication Sig Dispense Refill  . aspirin EC 81 MG tablet Take 81 mg by mouth daily.    Marland Kitchen atorvastatin (LIPITOR) 10 MG tablet Take 0.5 tablets (5 mg total) by mouth daily. 45 tablet 3  . b complex vitamins capsule Take 1 capsule by mouth daily.    . cholecalciferol (VITAMIN D) 1000 UNITS tablet Take 1,000 Units by mouth daily.    Marland Kitchen docusate sodium (COLACE) 100 MG capsule Take 100 mg by mouth daily.    Marland Kitchen donepezil (ARICEPT) 10 MG tablet Take 1 tablet (10 mg total) by mouth at bedtime. 90 tablet 3  . dorzolamide-timolol (COSOPT) 22.3-6.8 MG/ML ophthalmic solution Place 1 drop into both eyes 2 (two) times daily.      . fexofenadine (ALLEGRA) 180 MG tablet Take 180 mg by mouth daily.      Marland Kitchen  latanoprost (XALATAN) 0.005 % ophthalmic solution Place 1 drop into both eyes at bedtime.      Marland Kitchen losartan (COZAAR) 100 MG tablet TAKE 1 TABLET BY MOUTH DAILY 90 tablet 3  . Multiple Vitamins-Minerals (MENS MULTI VITAMIN & MINERAL) TABS Take 1 tablet by mouth daily.      . Polyvinyl Alcohol-Povidone (REFRESH OP) Apply to eye 2 (two) times daily.    . TRAVATAN Z 0.004 % SOLN ophthalmic solution     . valsartan (DIOVAN) 160 MG tablet TAKE 1 TABLET BY MOUTH DAILY 90 tablet 3  . acetaminophen (TYLENOL) 500 MG tablet Take 500 mg by mouth 2 (two) times daily as needed.     No facility-administered medications prior to visit.     Per HPI unless specifically indicated in ROS section below Review of Systems Objective:    BP 120/68 (BP Location: Left Arm, Patient Position: Sitting, Cuff Size: Normal)   Pulse 83   Temp (!) 97.3 F (36.3 C) (Temporal)   Ht 5\' 6"  (1.676 m)   Wt 164 lb 3 oz (74.5 kg)   SpO2 96%   BMI 26.50 kg/m   Wt Readings from Last 3 Encounters:  02/05/19 164 lb 3 oz (74.5 kg)  04/20/18 163 lb 3 oz (74 kg)  04/17/18 162 lb 12.8 oz (73.8 kg)  Physical Exam Vitals and nursing note reviewed.  Constitutional:      Appearance: Normal appearance. He is not ill-appearing.  Musculoskeletal:        General: Normal range of motion.     Right lower leg: No edema.     Left lower leg: No edema.     Comments:  Evident lower thoracic and lumbar scoliosis  No pain midline spine No paraspinous mm tenderness but evident tightness throughout Neg SLR bilaterally. No pain with int/ext rotation at hip. Neg FABER. No pain at SIJ, GTB or sciatic notch bilaterally.   Skin:    General: Skin is warm and dry.     Findings: No rash.  Neurological:     General: No focal deficit present.     Mental Status: He is alert.     Comments:  5/5 strength BLE  Psychiatric:        Mood and Affect: Mood normal.        Behavior: Behavior normal.       Results for orders placed or performed in  visit on 02/05/19  POCT Urinalysis Dipstick (Automated)  Result Value Ref Range   Color, UA yellow    Clarity, UA clear    Glucose, UA Negative Negative   Bilirubin, UA negative    Ketones, UA negative    Spec Grav, UA >=1.030 (A) 1.010 - 1.025   Blood, UA negative    pH, UA 5.5 5.0 - 8.0   Protein, UA Negative Negative   Urobilinogen, UA 0.2 0.2 or 1.0 E.U./dL   Nitrite, UA negative    Leukocytes, UA Negative Negative   Assessment & Plan:  This visit occurred during the SARS-CoV-2 public health emergency.  Safety protocols were in place, including screening questions prior to the visit, additional usage of staff PPE, and extensive cleaning of exam room while observing appropriate contact time as indicated for disinfecting solutions.   Problem List Items Addressed This Visit    Lumbar scoliosis    Noted on exam.       Lower back pain - Primary    Anticipate progression of lumbar DDD/spondylosis - update lumbar films today. No red flags, no focal findings noted. rec scheduled tylenol 1000mg  nightly, with second dose during day PRN, provided with stretching exercises from [facility] pt advisor.       Relevant Medications   acetaminophen (TYLENOL) 500 MG tablet   Other Relevant Orders   POCT Urinalysis Dipstick (Automated) (Completed)   DG Lumbar Spine Complete       Meds ordered this encounter  Medications  . acetaminophen (TYLENOL) 500 MG tablet    Sig: Take 2 tablets (1,000 mg total) by mouth at bedtime.   Orders Placed This Encounter  Procedures  . DG Lumbar Spine Complete    Standing Status:   Future    Number of Occurrences:   1    Standing Expiration Date:   04/07/2020    Order Specific Question:   Reason for Exam (SYMPTOM  OR DIAGNOSIS REQUIRED)    Answer:   weeks of lower back pain    Order Specific Question:   Preferred imaging location?    Answer:   [facility]    Order Specific Question:   Radiology Contrast Protocol - do NOT remove file path    Answer:    \\[email or web address]  . POCT Urinalysis Dipstick (Automated)    Patient Instructions  I think you have some scoliosis of the lower spine as well as wear  and tear arthritis changes.  Treat with tylenol 500mg  2 tablets every night at dinner to help you rest.  Look at exercises provided today for lower back pain Baseline xrays of lumbar spine done today.    Follow up plan: Return if symptoms worsen or fail to improve.  [name], MD

## 2019-02-05 NOTE — Patient Instructions (Signed)
I think you have some scoliosis of the lower spine as well as wear and tear arthritis changes.  Treat with tylenol 500mg  2 tablets every night at dinner to help you rest.  Look at exercises provided today for lower back pain Baseline xrays of lumbar spine done today.

## 2019-02-08 ENCOUNTER — Telehealth: Payer: Self-pay | Admitting: Family Medicine

## 2019-02-08 ENCOUNTER — Encounter: Payer: Self-pay | Admitting: Family Medicine

## 2019-02-08 ENCOUNTER — Other Ambulatory Visit: Payer: Self-pay | Admitting: Family Medicine

## 2019-02-08 DIAGNOSIS — K5900 Constipation, unspecified: Secondary | ICD-10-CM | POA: Insufficient documentation

## 2019-02-08 DIAGNOSIS — I700 Atherosclerosis of aorta: Secondary | ICD-10-CM | POA: Insufficient documentation

## 2019-02-08 MED ORDER — POLYETHYLENE GLYCOL 3350 17 GM/SCOOP PO POWD: 8.5000 g | Freq: Every day | ORAL | 1 refills | Status: DC

## 2019-02-08 NOTE — Telephone Encounter (Signed)
Patient's wife, [name], called to get patient's x-ray results.

## 2019-02-08 NOTE — Telephone Encounter (Signed)
She is aware.  [See Imaging Result Note, 02/05/19.]

## 2019-02-20 ENCOUNTER — Telehealth: Payer: Self-pay | Admitting: Family Medicine

## 2019-02-20 DIAGNOSIS — G3184 Mild cognitive impairment, so stated: Secondary | ICD-10-CM

## 2019-02-20 DIAGNOSIS — M5440 Lumbago with sciatica, unspecified side: Secondary | ICD-10-CM

## 2019-02-20 DIAGNOSIS — M4126 Other idiopathic scoliosis, lumbar region: Secondary | ICD-10-CM

## 2019-02-20 NOTE — Telephone Encounter (Signed)
Spoke with son over concerns over both parents. Concerned both parents are not doing well. More fatigued. Son concerned about dehydration.   Mom had case of diarrhea a few weeks ago which subsided, then abd discomfort "hiatal hernia acting up". Poor appetite. Son has started providing more ensure for home with benefit.   Dad now walking with walker, has continued to have falls. Decreased appetite. Now using potty chair due to dad's weakness.   God-niece and grandson have been helping out.  Will ask to expedite [facility] evaluation for pt and wife.

## 2019-02-20 NOTE — Telephone Encounter (Signed)
[name](son) called concerned about pt.  Stated he is not doing very well. And wanted to see if they could get home health to come out. See phone note on Arlynn Veasley dob [birth year]

## 2019-02-20 NOTE — Telephone Encounter (Signed)
[facility] has accepted the patient and son aware.

## 2019-02-20 NOTE — Telephone Encounter (Signed)
Calling agencies. Spoke with son [name] and told him I would call him when we find an agency to accept both of his parents.

## 2019-02-21 ENCOUNTER — Telehealth: Payer: Self-pay | Admitting: *Deleted

## 2019-02-21 DIAGNOSIS — M4726 Other spondylosis with radiculopathy, lumbar region: Secondary | ICD-10-CM | POA: Diagnosis not present

## 2019-02-21 DIAGNOSIS — Z87891 Personal history of nicotine dependence: Secondary | ICD-10-CM | POA: Diagnosis not present

## 2019-02-21 DIAGNOSIS — G3184 Mild cognitive impairment, so stated: Secondary | ICD-10-CM | POA: Diagnosis not present

## 2019-02-21 DIAGNOSIS — I10 Essential (primary) hypertension: Secondary | ICD-10-CM | POA: Diagnosis not present

## 2019-02-21 DIAGNOSIS — M5116 Intervertebral disc disorders with radiculopathy, lumbar region: Secondary | ICD-10-CM | POA: Diagnosis not present

## 2019-02-21 DIAGNOSIS — H9190 Unspecified hearing loss, unspecified ear: Secondary | ICD-10-CM | POA: Diagnosis not present

## 2019-02-21 DIAGNOSIS — H409 Unspecified glaucoma: Secondary | ICD-10-CM | POA: Diagnosis not present

## 2019-02-21 DIAGNOSIS — M4126 Other idiopathic scoliosis, lumbar region: Secondary | ICD-10-CM | POA: Diagnosis not present

## 2019-02-21 NOTE — Telephone Encounter (Signed)
After speaking to Dr.Gutierrez patient's son [name] was notified that patient should go to an Urgent Care or ER to be evaluated for possible dehydration. Advised [name] that if patient is dehydrated he may need IV fluids and we do not do fluids here at the office. [name] stated that after the nurse left they were able to get his dad up to the table and he did eat a little. [name] stated that his niece and son have been helping out with his dad this week and thinks that they have been able to get him to eat and drink a little more. [name] stated that he has thought that his dad may be dehydrated. [name] stated that he is going to contact the family that is staying with his dad now and see if they think he should go be evaluated. Information was given to [name] about the [facility] off [facility]. [name] stated that he will contact the office back with what they have decided to do.

## 2019-02-21 NOTE — Telephone Encounter (Signed)
Angela nurse with [facility] left a voicemail requesting verbal orders for nursing services for once a week for 4 weeks, then every other week for 4 weeks. Levada Dy stated that the patient has declined since Saturday per the family. Levada Dy stated that the patient slept almost the whole time that she was there. Levada Dy stated that her concern is dehydration. Levada Dy stated that patient has poor appetite and poor fluid intake. Levada Dy stated that patient is weak and does not know why the decline in patient since Saturday. Levada Dy wants to know if Dr. Danise Mina would like to order any lab work when she goes back next week?

## 2019-02-21 NOTE — Telephone Encounter (Signed)
[name] called back stating that his sister tried to schedule an appointment at [facility] in the morning because they can not get patient there today before they close. [name] stated that his sister was told by the person that answered the telephone they do not schedule appointments. [name] stated that they also told his sister that they would evaluate him and may not give IV fluids and he may end up having to go to the ER. [name] stated that his sister was also told that he may need to have a CT scan done before he is able to get IV fluids. [name] stated that he can not put his dad thru all of that.   After relaying this information to Dr. Idamae Lusher was called back and advised to plan on taking his day to the [facility] in the morning and the hours were given to him. Advised [name] that patient can be evaluated at the Urgent Care and if he needs to go to the ER they will send him there. Dr. Danise Mina stated that based on the information given on the patient he may need IV fluids and can possibly get them at at the Urgent Care. ER precautions given to patient's son and he verbalized understanding. [name] stated that he will plan on having his dad at the Urgent Care first thing in the morning. Marland Kitchen

## 2019-02-22 ENCOUNTER — Other Ambulatory Visit: Payer: Self-pay

## 2019-02-22 ENCOUNTER — Inpatient Hospital Stay (HOSPITAL_COMMUNITY): Payer: Medicare Other

## 2019-02-22 ENCOUNTER — Encounter (HOSPITAL_COMMUNITY): Payer: Self-pay | Admitting: Emergency Medicine

## 2019-02-22 ENCOUNTER — Inpatient Hospital Stay (HOSPITAL_COMMUNITY)
Admission: EM | Admit: 2019-02-22 | Discharge: 2019-03-16 | DRG: 987 | Disposition: A | Discharge status: Skilled Nursing Facility | Payer: Medicare Other | Attending: Internal Medicine | Admitting: Internal Medicine

## 2019-02-22 ENCOUNTER — Emergency Department (HOSPITAL_COMMUNITY): Payer: Medicare Other

## 2019-02-22 DIAGNOSIS — R630 Anorexia: Secondary | ICD-10-CM | POA: Diagnosis present

## 2019-02-22 DIAGNOSIS — E876 Hypokalemia: Secondary | ICD-10-CM | POA: Diagnosis present

## 2019-02-22 DIAGNOSIS — A529 Late syphilis, unspecified: Secondary | ICD-10-CM | POA: Diagnosis not present

## 2019-02-22 DIAGNOSIS — N401 Enlarged prostate with lower urinary tract symptoms: Secondary | ICD-10-CM | POA: Diagnosis present

## 2019-02-22 DIAGNOSIS — Z9049 Acquired absence of other specified parts of digestive tract: Secondary | ICD-10-CM

## 2019-02-22 DIAGNOSIS — F05 Delirium due to known physiological condition: Secondary | ICD-10-CM | POA: Diagnosis not present

## 2019-02-22 DIAGNOSIS — R109 Unspecified abdominal pain: Secondary | ICD-10-CM | POA: Diagnosis not present

## 2019-02-22 DIAGNOSIS — Z833 Family history of diabetes mellitus: Secondary | ICD-10-CM

## 2019-02-22 DIAGNOSIS — M6259 Muscle wasting and atrophy, not elsewhere classified, multiple sites: Secondary | ICD-10-CM | POA: Diagnosis not present

## 2019-02-22 DIAGNOSIS — C8338 Diffuse large B-cell lymphoma, lymph nodes of multiple sites: Secondary | ICD-10-CM | POA: Diagnosis present

## 2019-02-22 DIAGNOSIS — G9340 Encephalopathy, unspecified: Secondary | ICD-10-CM

## 2019-02-22 DIAGNOSIS — R531 Weakness: Secondary | ICD-10-CM | POA: Diagnosis not present

## 2019-02-22 DIAGNOSIS — Z8249 Family history of ischemic heart disease and other diseases of the circulatory system: Secondary | ICD-10-CM

## 2019-02-22 DIAGNOSIS — E440 Moderate protein-calorie malnutrition: Secondary | ICD-10-CM | POA: Diagnosis not present

## 2019-02-22 DIAGNOSIS — M8580 Other specified disorders of bone density and structure, unspecified site: Secondary | ICD-10-CM | POA: Diagnosis present

## 2019-02-22 DIAGNOSIS — J189 Pneumonia, unspecified organism: Secondary | ICD-10-CM | POA: Diagnosis present

## 2019-02-22 DIAGNOSIS — R161 Splenomegaly, not elsewhere classified: Secondary | ICD-10-CM | POA: Diagnosis not present

## 2019-02-22 DIAGNOSIS — I959 Hypotension, unspecified: Secondary | ICD-10-CM | POA: Diagnosis not present

## 2019-02-22 DIAGNOSIS — Z9181 History of falling: Secondary | ICD-10-CM

## 2019-02-22 DIAGNOSIS — K219 Gastro-esophageal reflux disease without esophagitis: Secondary | ICD-10-CM | POA: Diagnosis not present

## 2019-02-22 DIAGNOSIS — J329 Chronic sinusitis, unspecified: Secondary | ICD-10-CM | POA: Diagnosis present

## 2019-02-22 DIAGNOSIS — Z6825 Body mass index (BMI) 25.0-25.9, adult: Secondary | ICD-10-CM

## 2019-02-22 DIAGNOSIS — I700 Atherosclerosis of aorta: Secondary | ICD-10-CM | POA: Diagnosis present

## 2019-02-22 DIAGNOSIS — B20 Human immunodeficiency virus [HIV] disease: Secondary | ICD-10-CM | POA: Diagnosis not present

## 2019-02-22 DIAGNOSIS — G319 Degenerative disease of nervous system, unspecified: Secondary | ICD-10-CM | POA: Diagnosis not present

## 2019-02-22 DIAGNOSIS — E785 Hyperlipidemia, unspecified: Secondary | ICD-10-CM | POA: Diagnosis not present

## 2019-02-22 DIAGNOSIS — H409 Unspecified glaucoma: Secondary | ICD-10-CM | POA: Diagnosis present

## 2019-02-22 DIAGNOSIS — Z800 Family history of malignant neoplasm of digestive organs: Secondary | ICD-10-CM

## 2019-02-22 DIAGNOSIS — M2550 Pain in unspecified joint: Secondary | ICD-10-CM | POA: Diagnosis not present

## 2019-02-22 DIAGNOSIS — Z87891 Personal history of nicotine dependence: Secondary | ICD-10-CM

## 2019-02-22 DIAGNOSIS — R590 Localized enlarged lymph nodes: Secondary | ICD-10-CM | POA: Diagnosis present

## 2019-02-22 DIAGNOSIS — D479 Neoplasm of uncertain behavior of lymphoid, hematopoietic and related tissue, unspecified: Secondary | ICD-10-CM | POA: Diagnosis not present

## 2019-02-22 DIAGNOSIS — Z0189 Encounter for other specified special examinations: Secondary | ICD-10-CM | POA: Diagnosis not present

## 2019-02-22 DIAGNOSIS — Z7401 Bed confinement status: Secondary | ICD-10-CM | POA: Diagnosis not present

## 2019-02-22 DIAGNOSIS — G9341 Metabolic encephalopathy: Secondary | ICD-10-CM | POA: Diagnosis not present

## 2019-02-22 DIAGNOSIS — H9193 Unspecified hearing loss, bilateral: Secondary | ICD-10-CM | POA: Diagnosis present

## 2019-02-22 DIAGNOSIS — R1310 Dysphagia, unspecified: Secondary | ICD-10-CM | POA: Diagnosis not present

## 2019-02-22 DIAGNOSIS — M5136 Other intervertebral disc degeneration, lumbar region: Secondary | ICD-10-CM | POA: Diagnosis present

## 2019-02-22 DIAGNOSIS — R0902 Hypoxemia: Secondary | ICD-10-CM | POA: Diagnosis present

## 2019-02-22 DIAGNOSIS — Z21 Asymptomatic human immunodeficiency virus [HIV] infection status: Secondary | ICD-10-CM | POA: Diagnosis not present

## 2019-02-22 DIAGNOSIS — F0390 Unspecified dementia without behavioral disturbance: Secondary | ICD-10-CM | POA: Diagnosis not present

## 2019-02-22 DIAGNOSIS — Z806 Family history of leukemia: Secondary | ICD-10-CM

## 2019-02-22 DIAGNOSIS — G311 Senile degeneration of brain, not elsewhere classified: Secondary | ICD-10-CM | POA: Diagnosis not present

## 2019-02-22 DIAGNOSIS — R591 Generalized enlarged lymph nodes: Secondary | ICD-10-CM

## 2019-02-22 DIAGNOSIS — F0391 Unspecified dementia with behavioral disturbance: Secondary | ICD-10-CM | POA: Diagnosis present

## 2019-02-22 DIAGNOSIS — E871 Hypo-osmolality and hyponatremia: Secondary | ICD-10-CM | POA: Diagnosis not present

## 2019-02-22 DIAGNOSIS — R627 Adult failure to thrive: Secondary | ICD-10-CM | POA: Diagnosis present

## 2019-02-22 DIAGNOSIS — E46 Unspecified protein-calorie malnutrition: Secondary | ICD-10-CM | POA: Diagnosis not present

## 2019-02-22 DIAGNOSIS — C8330 Diffuse large B-cell lymphoma, unspecified site: Secondary | ICD-10-CM | POA: Diagnosis not present

## 2019-02-22 DIAGNOSIS — Z79899 Other long term (current) drug therapy: Secondary | ICD-10-CM

## 2019-02-22 DIAGNOSIS — H9190 Unspecified hearing loss, unspecified ear: Secondary | ICD-10-CM

## 2019-02-22 DIAGNOSIS — U071 COVID-19: Secondary | ICD-10-CM | POA: Diagnosis not present

## 2019-02-22 DIAGNOSIS — Z931 Gastrostomy status: Secondary | ICD-10-CM

## 2019-02-22 DIAGNOSIS — N138 Other obstructive and reflux uropathy: Secondary | ICD-10-CM | POA: Diagnosis present

## 2019-02-22 DIAGNOSIS — Z7189 Other specified counseling: Secondary | ICD-10-CM | POA: Diagnosis not present

## 2019-02-22 DIAGNOSIS — A523 Neurosyphilis, unspecified: Secondary | ICD-10-CM | POA: Diagnosis not present

## 2019-02-22 DIAGNOSIS — E860 Dehydration: Secondary | ICD-10-CM | POA: Diagnosis present

## 2019-02-22 DIAGNOSIS — C8511 Unspecified B-cell lymphoma, lymph nodes of head, face, and neck: Secondary | ICD-10-CM | POA: Diagnosis not present

## 2019-02-22 DIAGNOSIS — E119 Type 2 diabetes mellitus without complications: Secondary | ICD-10-CM | POA: Diagnosis not present

## 2019-02-22 DIAGNOSIS — C8331 Diffuse large B-cell lymphoma, lymph nodes of head, face, and neck: Secondary | ICD-10-CM | POA: Diagnosis not present

## 2019-02-22 DIAGNOSIS — I10 Essential (primary) hypertension: Secondary | ICD-10-CM | POA: Diagnosis present

## 2019-02-22 DIAGNOSIS — A530 Latent syphilis, unspecified as early or late: Secondary | ICD-10-CM | POA: Diagnosis not present

## 2019-02-22 DIAGNOSIS — R5381 Other malaise: Secondary | ICD-10-CM | POA: Diagnosis not present

## 2019-02-22 DIAGNOSIS — J349 Unspecified disorder of nose and nasal sinuses: Secondary | ICD-10-CM | POA: Diagnosis present

## 2019-02-22 DIAGNOSIS — Z803 Family history of malignant neoplasm of breast: Secondary | ICD-10-CM

## 2019-02-22 DIAGNOSIS — J1282 Pneumonia due to coronavirus disease 2019: Secondary | ICD-10-CM | POA: Diagnosis not present

## 2019-02-22 DIAGNOSIS — Z7982 Long term (current) use of aspirin: Secondary | ICD-10-CM

## 2019-02-22 DIAGNOSIS — Z9841 Cataract extraction status, right eye: Secondary | ICD-10-CM

## 2019-02-22 DIAGNOSIS — Z515 Encounter for palliative care: Secondary | ICD-10-CM | POA: Diagnosis not present

## 2019-02-22 DIAGNOSIS — M419 Scoliosis, unspecified: Secondary | ICD-10-CM | POA: Diagnosis present

## 2019-02-22 DIAGNOSIS — R4182 Altered mental status, unspecified: Secondary | ICD-10-CM | POA: Diagnosis not present

## 2019-02-22 DIAGNOSIS — A539 Syphilis, unspecified: Secondary | ICD-10-CM | POA: Diagnosis not present

## 2019-02-22 DIAGNOSIS — F03C0 Unspecified dementia, severe, without behavioral disturbance, psychotic disturbance, mood disturbance, and anxiety: Secondary | ICD-10-CM | POA: Diagnosis present

## 2019-02-22 DIAGNOSIS — R410 Disorientation, unspecified: Secondary | ICD-10-CM | POA: Diagnosis not present

## 2019-02-22 DIAGNOSIS — Z4682 Encounter for fitting and adjustment of non-vascular catheter: Secondary | ICD-10-CM | POA: Diagnosis not present

## 2019-02-22 DIAGNOSIS — Z87442 Personal history of urinary calculi: Secondary | ICD-10-CM

## 2019-02-22 DIAGNOSIS — R768 Other specified abnormal immunological findings in serum: Secondary | ICD-10-CM | POA: Diagnosis not present

## 2019-02-22 DIAGNOSIS — R296 Repeated falls: Secondary | ICD-10-CM | POA: Diagnosis present

## 2019-02-22 LAB — HEPATIC FUNCTION PANEL
ALT: 25 U/L (ref 0–44)
AST: 43 U/L — ABNORMAL HIGH (ref 15–41)
Albumin: 3.2 g/dL — ABNORMAL LOW (ref 3.5–5.0)
Alkaline Phosphatase: 70 U/L (ref 38–126)
Bilirubin, Direct: 0.1 mg/dL (ref 0.0–0.2)
Indirect Bilirubin: 0.6 mg/dL (ref 0.3–0.9)
Total Bilirubin: 0.7 mg/dL (ref 0.3–1.2)
Total Protein: 6.8 g/dL (ref 6.5–8.1)

## 2019-02-22 LAB — POC SARS CORONAVIRUS 2 AG -  ED: SARS Coronavirus 2 Ag: NEGATIVE

## 2019-02-22 LAB — BASIC METABOLIC PANEL
Anion gap: 11 (ref 5–15)
BUN: 20 mg/dL (ref 8–23)
CO2: 27 mmol/L (ref 22–32)
Calcium: 9.1 mg/dL (ref 8.9–10.3)
Chloride: 97 mmol/L — ABNORMAL LOW (ref 98–111)
Creatinine, Ser: 0.71 mg/dL (ref 0.61–1.24)
GFR calc Af Amer: >60 mL/min (ref >60)
GFR calc non Af Amer: >60 mL/min (ref >60)
Glucose, Bld: 103 mg/dL — ABNORMAL HIGH (ref 70–99)
Potassium: 3.5 mmol/L (ref 3.5–5.1)
Sodium: 135 mmol/L (ref 135–145)

## 2019-02-22 LAB — URINALYSIS, ROUTINE W REFLEX MICROSCOPIC
Bilirubin Urine: NEGATIVE
Glucose, UA: NEGATIVE mg/dL
Hgb urine dipstick: NEGATIVE
Ketones, ur: NEGATIVE mg/dL
Leukocytes,Ua: NEGATIVE
Nitrite: NEGATIVE
Protein, ur: NEGATIVE mg/dL
Specific Gravity, Urine: 1.019 (ref 1.005–1.030)
pH: 7 (ref 5.0–8.0)

## 2019-02-22 LAB — CBC
HCT: 44 % (ref 39.0–52.0)
Hemoglobin: 14.6 g/dL (ref 13.0–17.0)
MCH: 31.1 pg (ref 26.0–34.0)
MCHC: 33.2 g/dL (ref 30.0–36.0)
MCV: 93.8 fL (ref 80.0–100.0)
Platelets: 182 10*3/uL (ref 150–400)
RBC: 4.69 MIL/uL (ref 4.22–5.81)
RDW: 13.5 % (ref 11.5–15.5)
WBC: 5.5 10*3/uL (ref 4.0–10.5)
nRBC: 0 % (ref 0.0–0.2)

## 2019-02-22 LAB — SARS CORONAVIRUS 2 (TAT 6-24 HRS): SARS Coronavirus 2: POSITIVE — AB

## 2019-02-22 LAB — STREP PNEUMONIAE URINARY ANTIGEN: Strep Pneumo Urinary Antigen: NEGATIVE

## 2019-02-22 LAB — LIPASE, BLOOD: Lipase: 33 U/L (ref 11–51)

## 2019-02-22 MED ORDER — SODIUM CHLORIDE 0.9 % IV SOLN
500.0000 mg | INTRAVENOUS | Status: DC
  Filled 2019-02-22: qty 500

## 2019-02-22 MED ORDER — SODIUM CHLORIDE 0.9 % IV SOLN: 1.0000 g | INTRAVENOUS | Status: DC

## 2019-02-22 MED ORDER — SODIUM CHLORIDE 0.9 % IV SOLN: INTRAVENOUS | Status: DC

## 2019-02-22 MED ORDER — SODIUM CHLORIDE 0.9 % IV SOLN
1.0000 g | Freq: Once | INTRAVENOUS | Status: AC
  Administered 2019-02-22: 17:00:00 1 g via INTRAVENOUS
  Filled 2019-02-22: qty 10

## 2019-02-22 MED ORDER — IRBESARTAN 150 MG PO TABS
150.0000 mg | ORAL_TABLET | Freq: Every day | ORAL | Status: DC
  Administered 2019-02-23 – 2019-02-26 (×4): 150 mg via ORAL
  Filled 2019-02-22 (×5): qty 1

## 2019-02-22 MED ORDER — SODIUM CHLORIDE (PF) 0.9 % IJ SOLN
INTRAMUSCULAR | Status: AC
  Filled 2019-02-22: qty 50

## 2019-02-22 MED ORDER — DONEPEZIL HCL 10 MG PO TABS
10.0000 mg | ORAL_TABLET | Freq: Every day | ORAL | Status: DC
  Administered 2019-02-22 – 2019-03-15 (×21): 10 mg via ORAL
  Filled 2019-02-22 (×23): qty 1

## 2019-02-22 MED ORDER — ATORVASTATIN CALCIUM 10 MG PO TABS
5.0000 mg | ORAL_TABLET | Freq: Every day | ORAL | Status: DC
  Administered 2019-02-22 – 2019-03-16 (×21): 5 mg via ORAL
  Filled 2019-02-22 (×22): qty 1

## 2019-02-22 MED ORDER — ENOXAPARIN SODIUM 40 MG/0.4ML ~~LOC~~ SOLN
40.0000 mg | Freq: Every day | SUBCUTANEOUS | Status: DC
  Administered 2019-02-22 – 2019-02-25 (×4): 40 mg via SUBCUTANEOUS
  Filled 2019-02-22 (×4): qty 0.4

## 2019-02-22 MED ORDER — SODIUM CHLORIDE 0.9 % IV SOLN
500.0000 mg | Freq: Once | INTRAVENOUS | Status: AC
  Administered 2019-02-22: 18:00:00 500 mg via INTRAVENOUS
  Filled 2019-02-22: qty 500

## 2019-02-22 MED ORDER — BRINZOLAMIDE 1 % OP SUSP
1.0000 [drp] | Freq: Three times a day (TID) | OPHTHALMIC | Status: DC
  Administered 2019-02-22 – 2019-03-16 (×62): 1 [drp] via OPHTHALMIC
  Filled 2019-02-22: qty 10

## 2019-02-22 MED ORDER — IOHEXOL 300 MG/ML  SOLN
100.0000 mL | Freq: Once | INTRAMUSCULAR | Status: AC | PRN
  Administered 2019-02-22: 100 mL via INTRAVENOUS

## 2019-02-22 MED ORDER — LORAZEPAM 2 MG/ML IJ SOLN
1.0000 mg | Freq: Once | INTRAMUSCULAR | Status: AC
  Administered 2019-02-22: 1 mg via INTRAVENOUS
  Filled 2019-02-22: qty 1

## 2019-02-22 MED ORDER — LATANOPROST 0.005 % OP SOLN
1.0000 [drp] | Freq: Every day | OPHTHALMIC | Status: DC
  Administered 2019-02-22 – 2019-03-15 (×21): 1 [drp] via OPHTHALMIC
  Filled 2019-02-22: qty 2.5

## 2019-02-22 MED ORDER — ACETAMINOPHEN 500 MG PO TABS
1000.0000 mg | ORAL_TABLET | Freq: Once | ORAL | Status: AC
  Administered 2019-02-22: 1000 mg via ORAL
  Filled 2019-02-22: qty 2

## 2019-02-22 MED ORDER — SODIUM CHLORIDE 0.9 % IV BOLUS
1000.0000 mL | Freq: Once | INTRAVENOUS | Status: AC
  Administered 2019-02-22: 13:00:00 1000 mL via INTRAVENOUS

## 2019-02-22 MED ORDER — ASPIRIN EC 81 MG PO TBEC
81.0000 mg | DELAYED_RELEASE_TABLET | Freq: Every day | ORAL | Status: DC
  Administered 2019-02-22 – 2019-03-16 (×21): 81 mg via ORAL
  Filled 2019-02-22 (×22): qty 1

## 2019-02-22 NOTE — ED Notes (Signed)
Pt transported to CT ?

## 2019-02-22 NOTE — ED Provider Notes (Signed)
[facility] DEPT Provider Note   CSN: [id number] Arrival date & time: 02/22/19  1104     History Chief Complaint  Patient presents with  . Fatigue    [name] is a 85 y.o. male presenting for evaluation of generalized weakness.  Level 5 caveat due to dementia.  Per son, patient has had worsening weakness for the past 3 weeks.  History of scoliosis.  He has had difficulty walking and frequent falls.  Imaging with his PCP showed degeneration of the lumbar spine.  Son states that the past several weeks he has had decreased appetite, has not been eating and drinking well.  The symptoms are often intermittent, however have been persistent the past several days.  Home health nurse came out yesterday, was concerned about dehydration and felt patient needed IV fluids.  Son denies fevers, nausea, vomiting, diarrhea.  Son states patient was supposed to start a medicine for constipation, but has not been taking it.  Son states patient normally will urinate once a night, over the past 5 days he has not urinated all.  Denies cough.  Denies sick contacts.  Denies known exposure to COVID-19.  Patient denies pain.  He has no complaints at this time.   HPI     Past Medical History:  Diagnosis Date  . Allergic rhinitis   . BPH (benign prostatic hypertrophy) with urinary obstruction 2000   40gm, L lobe 40mm prostate nodule; followed yearly by Dr Diona Fanti  . GERD (gastroesophageal reflux disease)   . Glaucoma   . Herpes zoster ophthalmicus    history  . History of kidney stones remote   x 1-Ca monhydrate, hyperoxalate urine s/p lithotripsy  . HLD (hyperlipidemia)   . HTN (hypertension)   . Impaired glucose tolerance   . Impaired hearing    uses hearing aides bilaterally  . Osteopenia     Patient Active Problem List   Diagnosis Date Noted  . Aorto-iliac atherosclerosis ([place]) 02/08/2019  . Constipation 02/08/2019  . Lumbar scoliosis 04/20/2018  .  Prostate nodule 04/20/2018  . Hearing loss 10/19/2017  . Benign prostatic hyperplasia with urinary obstruction   . Skin lesions 10/10/2014  . Lower back pain 10/10/2014  . Advanced care planning/counseling discussion 03/20/2014  . MCI (mild cognitive impairment) with memory loss 03/20/2014  . Medicare annual wellness visit, subsequent 01/04/2012  . Cardiac murmur 01/04/2012  . Dyslipidemia 03/31/2010  . Glaucoma 03/31/2010  . Essential hypertension 03/31/2010  . Allergic rhinitis 03/31/2010  . SMALL BOWEL OBSTRUCTION, HX OF 03/31/2010  . NEPHROLITHIASIS, HX OF 03/31/2010    Past Surgical History:  Procedure Laterality Date  . APPENDECTOMY  1997  . CATARACT EXTRACTION Right 11/1013   Dr. Venetia Maxon  . COLONOSCOPY  2002   WNL-[facility]; repeat 2012  . COLONOSCOPY  02/2012   tubular adenoma Deatra Ina)  . [place]   left-1955; right-1970  . LITHOTRIPSY  2005  . SBO  2008, 2010       Family History  Problem Relation Age of Onset  . Coronary artery disease Father   . Healthy Mother   . Diabetes Brother   . Breast cancer Sister   . Leukemia Maternal Uncle   . Pancreatic cancer Sister     Social History   Tobacco Use  . Smoking status: Former Smoker    Types: Cigarettes  . Smokeless tobacco: Never Used  Substance Use Topics  . Alcohol use: No  . Drug use: No  Home Medications Prior to Admission medications   Medication Sig Start Date End Date Taking? Authorizing Provider  acetaminophen (TYLENOL) 500 MG tablet Take 2 tablets (1,000 mg total) by mouth at bedtime. 02/05/19  Yes [name], MD  aspirin EC 81 MG tablet Take 81 mg by mouth daily.   Yes [provider]  atorvastatin (LIPITOR) 10 MG tablet Take 0.5 tablets (5 mg total) by mouth daily. 04/20/18  Yes [name], MD  AZOPT 1 % ophthalmic suspension Place 1 drop into both eyes 3 (three) times daily. 02/20/19  Yes [provider]  donepezil (ARICEPT) 10 MG  tablet Take 1 tablet (10 mg total) by mouth at bedtime. 04/20/18  Yes [name], MD  polyethylene glycol powder [facility]) 17 GM/SCOOP powder Take 8.5-17 g by mouth daily. Hold for diarrhea Patient taking differently: Take 8.5-17 g by mouth daily as needed for mild constipation or moderate constipation. Hold for diarrhea 02/08/19  Yes [name], MD  valsartan (DIOVAN) 160 MG tablet TAKE 1 TABLET BY MOUTH DAILY 06/29/18  Yes [name], MD  VYZULTA 0.024 % SOLN Place 1 drop into both eyes at bedtime. 02/20/19  Yes [provider]  losartan (COZAAR) 100 MG tablet TAKE 1 TABLET BY MOUTH DAILY 05/02/17   [name], MD    Allergies    Patient has no known allergies.  Review of Systems   Review of Systems  Unable to perform ROS: Dementia  Constitutional: Positive for appetite change.  Neurological: Positive for weakness.    Physical Exam Updated Vital Signs BP 132/69   Pulse 92   Temp 99.4 F (37.4 C) (Rectal)   Resp 18   SpO2 100%   Physical Exam Vitals and nursing note reviewed.  Constitutional:      General: He is not in acute distress.    Appearance: He is well-developed.     Comments: Resting comfortably in the bed.  In no acute distress.  HENT:     Head: Normocephalic and atraumatic.     Mouth/Throat:     Mouth: Mucous membranes are dry.     Comments: MM dry Eyes:     Extraocular Movements: Extraocular movements intact.     Conjunctiva/sclera: Conjunctivae normal.     Pupils: Pupils are equal, round, and reactive to light.  Cardiovascular:     Rate and Rhythm: Normal rate and regular rhythm.     Pulses: Normal pulses.  Pulmonary:     Effort: Pulmonary effort is normal. No respiratory distress.     Breath sounds: Normal breath sounds. No wheezing.     Comments: Clear lung sounds in all fields Abdominal:     General: There is no distension.     Palpations: Abdomen is soft. There is no mass.     Tenderness: There is no  abdominal tenderness. There is no guarding or rebound.     Comments: No tenderness palpation the abdomen.  Soft without any, guarding, distention.  Negative rebound.  Musculoskeletal:        General: Normal range of motion.     Cervical back: Normal range of motion and neck supple.     Comments: No calf pain or swelling.  No pitting edema.  Skin:    General: Skin is warm and dry.     Capillary Refill: Capillary refill takes less than 2 seconds.  Neurological:     Mental Status: He is alert.     Comments: Oriented to person and place  ED Results / Procedures / Treatments   Labs (all labs ordered are listed, but only abnormal results are displayed) Labs Reviewed  BASIC METABOLIC PANEL - Abnormal; Notable for the following components:      Result Value   Chloride 97 (*)    Glucose, Bld 103 (*)    All other components within normal limits  HEPATIC FUNCTION PANEL - Abnormal; Notable for the following components:   Albumin 3.2 (*)    AST 43 (*)    All other components within normal limits  URINE CULTURE  SARS CORONAVIRUS 2 (TAT 6-24 HRS)  CBC  URINALYSIS, ROUTINE W REFLEX MICROSCOPIC  LIPASE, BLOOD  CBG MONITORING, ED  POC SARS CORONAVIRUS 2 AG -  ED    EKG EKG Interpretation  Date/Time:  Thursday February 22 2019 11:48:45 EST Ventricular Rate:  96 PR Interval:    QRS Duration: 88 QT Interval:  356 QTC Calculation: 448 R Axis:   68 Text Interpretation: Sinus rhythm Probable left atrial enlargement Borderline low voltage, extremity leads Repol abnrm suggests ischemia, lateral leads ST elevation, consider inferior injury No significant change since last tracing Confirmed by Wandra Arthurs [phone]) on 02/22/2019 1:06:03 PM   Radiology CT Head Wo Contrast  Result Date: 02/22/2019 CLINICAL DATA:  Altered mental status with fatigue.  Dehydration. EXAM: CT HEAD WITHOUT CONTRAST TECHNIQUE: Contiguous axial images were obtained from the base of the skull through the vertex  without intravenous contrast. COMPARISON:  None. FINDINGS: Brain: There is moderate diffuse atrophy. There is no intracranial mass, hemorrhage, extra-axial fluid collection, or midline shift. Brain parenchyma appears unremarkable. No evident acute infarct. Vascular: There is no hyperdense vessel. There are foci of calcification in each carotid siphon region. Skull: Bony calvarium appears intact. Sinuses/Orbits: There is a retention cyst in the inferior posterior right maxillary antrum. There is mucosal thickening and opacification involving multiple ethmoid air cells. Orbits appear symmetric bilaterally except for evidence of previous cataract surgery on the right. Other: Mastoid air cells are clear. IMPRESSION: 1. Atrophy. Brain parenchyma appears unremarkable. No acute infarct. No mass or hemorrhage. 2.  Foci of arterial vascular calcification noted. 3.  Foci of paranasal sinus disease evident. Electronically Signed   By: Lowella Grip III M.D.   On: 02/22/2019 14:57   CT ABDOMEN PELVIS W CONTRAST  Result Date: 02/22/2019 CLINICAL DATA:  Abdominal distension. Anorexia. EXAM: CT ABDOMEN AND PELVIS WITH CONTRAST TECHNIQUE: Multidetector CT imaging of the abdomen and pelvis was performed using the standard protocol following bolus administration of intravenous contrast. CONTRAST:  167mL OMNIPAQUE IOHEXOL 300 MG/ML  SOLN COMPARISON:  03/21/2007 FINDINGS: Lower Chest: Patchy areas of airspace disease are seen bilaterally mainly in the lower lobes, suspicious for viral pneumonia or inflammatory process. No evidence of pleural effusion. Hepatobiliary: No hepatic masses identified. Multiple small hepatic cysts are stable. Gallbladder is unremarkable. No evidence of biliary ductal dilatation. Pancreas:  No mass or inflammatory changes. Spleen: No evidence of splenomegaly. A new low-attenuation soft tissue mass is seen in the central spleen which measures 3.8 x 3.4 cm. Adrenals/Urinary Tract: Several large renal  cysts are again seen bilaterally. No masses identified. No evidence of ureteral calculi or hydronephrosis. Unremarkable unopacified urinary bladder. Stomach/Bowel: No evidence of obstruction, inflammatory process or abnormal fluid collections. Vascular/Lymphatic: New abdominal retroperitoneal lymphadenopathy is seen in the region of the celiac axis and throughout the left paraaortic and aortocaval spaces. Largest conglomeration of lymph nodes in the left paraaortic region measures 4.9 x 3.1 cm on image  43/2. No pathologically enlarged lymph nodes seen within the pelvis. Aortic atherosclerosis incidentally noted. No abdominal aortic aneurysm. Reproductive:  No mass or other significant abnormality identified. Other:  None. Musculoskeletal:  No suspicious bone lesions identified. IMPRESSION: 1. New abdominal retroperitoneal lymphadenopathy and splenic mass. This is likely due to lymphoproliferative disorder, with metastatic disease considered less likely. 2. Patchy bilateral airspace disease mainly in the lower lobes, suspicious for viral pneumonia or inflammatory process. Pulmonary involvement by lymphoma is considered less likely but cannot be excluded. Recommend clinical correlation and COVID-19 virus testing. Electronically Signed   By: Marlaine Hind M.D.   On: 02/22/2019 15:05   DG Chest Portable 1 View  Result Date: 02/22/2019 CLINICAL DATA:  Increasing shortness of breath over the past 7 days EXAM: PORTABLE CHEST 1 VIEW COMPARISON:  10/17/2003 FINDINGS: Cardiac shadow is accentuated by the portable technique. Aortic calcifications are noted. Elevation the right hemidiaphragm is noted new from the prior exam. No focal infiltrate or sizable effusion is seen. No bony abnormality is noted. IMPRESSION: No active disease. Electronically Signed   By: Inez Catalina M.D.   On: 02/22/2019 14:03    Procedures Procedures (including critical care time)  Medications Ordered in ED Medications  sodium chloride (PF)  0.9 % injection (has no administration in time range)  cefTRIAXone (ROCEPHIN) 1 g in sodium chloride 0.9 % 100 mL IVPB (has no administration in time range)  azithromycin (ZITHROMAX) 500 mg in sodium chloride 0.9 % 250 mL IVPB (has no administration in time range)  acetaminophen (TYLENOL) tablet 1,000 mg (has no administration in time range)  sodium chloride 0.9 % bolus 1,000 mL (0 mLs Intravenous Stopped 02/22/19 1444)  iohexol (OMNIPAQUE) 300 MG/ML solution 100 mL (100 mLs Intravenous Contrast Given 02/22/19 1420)    ED Course  I have reviewed the triage vital signs and the nursing notes.  Pertinent labs & imaging results that were available during my care of the patient were reviewed by me and considered in my medical decision making (see chart for details).    MDM Rules/Calculators/A&P                      Patient presented for evaluation of weakness and decreased oral intake.  Concern for possible dehydration.  On exam, patient appears chronically ill.  He is pleasantly demented and hard of hearing, limiting history.  51 of history obtained from patient's son, [name].  Will obtain labs, urine, and chest x-ray to rule out metabolic cause for weakness and infectious causes for weakness.  Due to decreased p.o. intake, will obtain CT abdomen pelvis, CT head ordered for further evaluation of weakness.  Discussed with attending, Dr. Darl Householder evaluated patient.  Labs overall reassuring.  Creatinine stable.  Electrolytes stable.  UA without infection.  Chest x-ray ventriculotomy, no obvious pneumonia, proximal effusion, cardiomegaly.  CT scans pending.  CT abdomen pelvis shows: Lymph nodes concerning for lymphoproliferative disease as well as a new splenic mass.  Additionally, patient has patchy infiltrates of bilateral lower lungs, consider atypical infection versus lymphoma versus bacterial infection.  As patient's rapid Covid is negative, will start antibiotics for presumed bacterial pneumonia.   Will obtain PCR test.  Due to patient's weakness and worsening status at home, I believe he would benefit from admission for rehydration, physical therapy, and tx for patchy infiltrates.  Discussed findings with patient and son who are agreeable to plan.  Will call for admission.  Discussed with Dr. Karleen Hampshire triad hospitalist service,  patient to be admitted.  [name] was evaluated in Emergency Department on 02/22/2019 for the symptoms described in the history of present illness. He was evaluated in the context of the global COVID-19 pandemic, which necessitated consideration that the patient might be at risk for infection with the SARS-CoV-2 virus that causes COVID-19. Institutional protocols and algorithms that pertain to the evaluation of patients at risk for COVID-19 are in a state of rapid change based on information released by regulatory bodies including the CDC and federal and state organizations. These policies and algorithms were followed during the patient's care in the ED.  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

## 2019-02-22 NOTE — H&P (Signed)
History and Physical    [name] [id number] DOB: 06/19/1928 DOA: 02/22/2019  PCP: [name], MD  Patient coming from: Home with wife.   I have personally briefly reviewed patient's old medical records in [place]  Chief Complaint: brought in by son for generalized weakness.   HPI: [name] is a 85 y.o. male with medical history significant of hyeprtension, hyperlipidemia, GERD, BPH, was brought in by son for many weeks of generalized weakness, associated with no appetite, weight loss over the last few weeks,. Pt is a poor historian due to dementia and is oriented to person only. Most of the history obtained from the son and the ED note. As per the patient he  denies any fever or chills, no nausea , vomiting, abdominal pain, diarrhea, or dysuria. No fevers or chills, pt denies any sob or chest pain or cough. No sick contacts or COVID 19 exposure. His son arranged fro home health PT who came in yesterday and recommended to take him to the hospital as he was doing poorly.  ED Course: on arrival to ED, he had low grade temp of 99.4,  normotensive , hypoxic of 84%, tachycardic. UA is negative. CXR does not show pneumonia,. CT head without contrast is  Neg for acute infarct.  CT abd and pelvis shows splenic mass and retroperitoneal lymphadenopathy and bilateral patchy air space disease in the lower lobes suspicious for pneumonia.   He was referred to [facility] for admission for PNA.  His COVID 19 screen is pending.    Review of Systems: detailed ROS couldn't be obtained due to dementia. See HPI for history from the patient and son.  Past Medical History:  Diagnosis Date  . Allergic rhinitis   . BPH (benign prostatic hypertrophy) with urinary obstruction 2000   40gm, L lobe 68mm prostate nodule; followed yearly by Dr Diona Fanti  . GERD (gastroesophageal reflux disease)   . Glaucoma   . Herpes zoster ophthalmicus    history  . History of kidney stones remote   x 1-Ca  monhydrate, hyperoxalate urine s/p lithotripsy  . HLD (hyperlipidemia)   . HTN (hypertension)   . Impaired glucose tolerance   . Impaired hearing    uses hearing aides bilaterally  . Osteopenia     Past Surgical History:  Procedure Laterality Date  . APPENDECTOMY  1997  . CATARACT EXTRACTION Right 11/1013   Dr. Venetia Maxon  . COLONOSCOPY  2002   WNL-[facility]; repeat 2012  . COLONOSCOPY  02/2012   tubular adenoma Deatra Ina)  . [place]   left-1955; right-1970  . LITHOTRIPSY  2005  . SBO  2008, 2010   Social history.   reports that he has quit smoking. His smoking use included cigarettes. He has never used smokeless tobacco. He reports that he does not drink alcohol or use drugs.  No Known Allergies  Family History  Problem Relation Age of Onset  . Coronary artery disease Father   . Healthy Mother   . Diabetes Brother   . Breast cancer Sister   . Leukemia Maternal Uncle   . Pancreatic cancer Sister    Family history reviewed and Pertinent.   Prior to Admission medications   Medication Sig Start Date End Date Taking? Authorizing Provider  acetaminophen (TYLENOL) 500 MG tablet Take 2 tablets (1,000 mg total) by mouth at bedtime. 02/05/19  Yes [name], MD  aspirin EC 81 MG tablet Take 81 mg by mouth daily.  Yes [provider]  atorvastatin (LIPITOR) 10 MG tablet Take 0.5 tablets (5 mg total) by mouth daily. 04/20/18  Yes [name], MD  AZOPT 1 % ophthalmic suspension Place 1 drop into both eyes 3 (three) times daily. 02/20/19  Yes [provider]  donepezil (ARICEPT) 10 MG tablet Take 1 tablet (10 mg total) by mouth at bedtime. 04/20/18  Yes [name], MD  polyethylene glycol powder [facility]) 17 GM/SCOOP powder Take 8.5-17 g by mouth daily. Hold for diarrhea Patient taking differently: Take 8.5-17 g by mouth daily as needed for mild constipation or moderate constipation. Hold for diarrhea 02/08/19  Yes  [name], MD  valsartan (DIOVAN) 160 MG tablet TAKE 1 TABLET BY MOUTH DAILY 06/29/18  Yes [name], MD  VYZULTA 0.024 % SOLN Place 1 drop into both eyes at bedtime. 02/20/19  Yes [provider]  losartan (COZAAR) 100 MG tablet TAKE 1 TABLET BY MOUTH DAILY 05/02/17   [name], MD    Physical Exam:  Constitutional: NAD, calm, comfortable Vitals:   02/22/19 1436 02/22/19 1630 02/22/19 1700 02/22/19 1730  BP: 132/69 135/67 107/87 109/90  Pulse: 92 89 89 (!) 105  Resp: 18 20 19 17   Temp:      TempSrc:      SpO2: 100% (!) 84% 96% 95%   Eyes: PERRL, lids and conjunctivae normal ENMT: Mucous membranes are dry.  Neck: normal, supple,  Respiratory: diminished at bases, no wheezing or rhonchi,  Cardiovascular: Regular rate and rhythm, no murmurs / rubs / gallops.  Abdomen: no tenderness, abd is soft, non tender non distended.  Bowel sounds positive.  Musculoskeletal: no clubbing / cyanosis.no pedal edema.  Skin: no rashes, lesions, ulcers. No induration Neurologic: CN 2-12 grossly intact. Strength 4/5 in all extremities.  Psychiatric: alert and oriented to person only.   Labs on Admission: I have personally reviewed following labs and imaging studies  CBC: Recent Labs  Lab 02/22/19 1123  WBC 5.5  HGB 14.6  HCT 44.0  MCV 93.8  PLT [id number]   Basic Metabolic Panel: Recent Labs  Lab 02/22/19 1123  NA 135  K 3.5  CL 97*  CO2 27  GLUCOSE 103*  BUN 20  CREATININE 0.71  CALCIUM 9.1   GFR: CrCl cannot be calculated (Unknown ideal weight.). Liver Function Tests: Recent Labs  Lab 02/22/19 1226  AST 43*  ALT 25  ALKPHOS 70  BILITOT 0.7  PROT 6.8  ALBUMIN 3.2*   Recent Labs  Lab 02/22/19 1226  LIPASE 33   No results for input(s): AMMONIA in the last 168 hours. Coagulation Profile: No results for input(s): INR, PROTIME in the last 168 hours. Cardiac Enzymes: No results for input(s): CKTOTAL, CKMB, CKMBINDEX, TROPONINI in the last 168  hours. BNP (last 3 results) No results for input(s): PROBNP in the last 8760 hours. HbA1C: No results for input(s): HGBA1C in the last 72 hours. CBG: No results for input(s): GLUCAP in the last 168 hours. Lipid Profile: No results for input(s): CHOL, HDL, LDLCALC, TRIG, CHOLHDL, LDLDIRECT in the last 72 hours. Thyroid Function Tests: No results for input(s): TSH, T4TOTAL, FREET4, T3FREE, THYROIDAB in the last 72 hours. Anemia Panel: No results for input(s): VITAMINB12, FOLATE, FERRITIN, TIBC, IRON, RETICCTPCT in the last 72 hours. Urine analysis:    Component Value Date/Time   COLORURINE YELLOW 02/22/2019 1226   APPEARANCEUR CLEAR 02/22/2019 1226   LABSPEC 1.019 02/22/2019 1226   PHURINE 7.0 02/22/2019 1226   [place] 02/22/2019 1226  HGBUR NEGATIVE 02/22/2019 [place] 02/22/2019 1226   BILIRUBINUR negative 02/05/2019 1610   KETONESUR NEGATIVE 02/22/2019 1226   PROTEINUR NEGATIVE 02/22/2019 1226   UROBILINOGEN 0.2 02/05/2019 1610   UROBILINOGEN 0.2 03/21/2007 0944   NITRITE NEGATIVE 02/22/2019 1226   LEUKOCYTESUR NEGATIVE 02/22/2019 1226    Radiological Exams on Admission: CT Head Wo Contrast  Result Date: 02/22/2019 CLINICAL DATA:  Altered mental status with fatigue.  Dehydration. EXAM: CT HEAD WITHOUT CONTRAST TECHNIQUE: Contiguous axial images were obtained from the base of the skull through the vertex without intravenous contrast. COMPARISON:  None. FINDINGS: Brain: There is moderate diffuse atrophy. There is no intracranial mass, hemorrhage, extra-axial fluid collection, or midline shift. Brain parenchyma appears unremarkable. No evident acute infarct. Vascular: There is no hyperdense vessel. There are foci of calcification in each carotid siphon region. Skull: Bony calvarium appears intact. Sinuses/Orbits: There is a retention cyst in the inferior posterior right maxillary antrum. There is mucosal thickening and opacification involving multiple  ethmoid air cells. Orbits appear symmetric bilaterally except for evidence of previous cataract surgery on the right. Other: Mastoid air cells are clear. IMPRESSION: 1. Atrophy. Brain parenchyma appears unremarkable. No acute infarct. No mass or hemorrhage. 2.  Foci of arterial vascular calcification noted. 3.  Foci of paranasal sinus disease evident. Electronically Signed   By: Lowella Grip III M.D.   On: 02/22/2019 14:57   CT ABDOMEN PELVIS W CONTRAST  Result Date: 02/22/2019 CLINICAL DATA:  Abdominal distension. Anorexia. EXAM: CT ABDOMEN AND PELVIS WITH CONTRAST TECHNIQUE: Multidetector CT imaging of the abdomen and pelvis was performed using the standard protocol following bolus administration of intravenous contrast. CONTRAST:  150mL OMNIPAQUE IOHEXOL 300 MG/ML  SOLN COMPARISON:  03/21/2007 FINDINGS: Lower Chest: Patchy areas of airspace disease are seen bilaterally mainly in the lower lobes, suspicious for viral pneumonia or inflammatory process. No evidence of pleural effusion. Hepatobiliary: No hepatic masses identified. Multiple small hepatic cysts are stable. Gallbladder is unremarkable. No evidence of biliary ductal dilatation. Pancreas:  No mass or inflammatory changes. Spleen: No evidence of splenomegaly. A new low-attenuation soft tissue mass is seen in the central spleen which measures 3.8 x 3.4 cm. Adrenals/Urinary Tract: Several large renal cysts are again seen bilaterally. No masses identified. No evidence of ureteral calculi or hydronephrosis. Unremarkable unopacified urinary bladder. Stomach/Bowel: No evidence of obstruction, inflammatory process or abnormal fluid collections. Vascular/Lymphatic: New abdominal retroperitoneal lymphadenopathy is seen in the region of the celiac axis and throughout the left paraaortic and aortocaval spaces. Largest conglomeration of lymph nodes in the left paraaortic region measures 4.9 x 3.1 cm on image 43/2. No pathologically enlarged lymph nodes seen  within the pelvis. Aortic atherosclerosis incidentally noted. No abdominal aortic aneurysm. Reproductive:  No mass or other significant abnormality identified. Other:  None. Musculoskeletal:  No suspicious bone lesions identified. IMPRESSION: 1. New abdominal retroperitoneal lymphadenopathy and splenic mass. This is likely due to lymphoproliferative disorder, with metastatic disease considered less likely. 2. Patchy bilateral airspace disease mainly in the lower lobes, suspicious for viral pneumonia or inflammatory process. Pulmonary involvement by lymphoma is considered less likely but cannot be excluded. Recommend clinical correlation and COVID-19 virus testing. Electronically Signed   By: Marlaine Hind M.D.   On: 02/22/2019 15:05   DG Chest Portable 1 View  Result Date: 02/22/2019 CLINICAL DATA:  Increasing shortness of breath over the past 7 days EXAM: PORTABLE CHEST 1 VIEW COMPARISON:  10/17/2003 FINDINGS: Cardiac shadow is accentuated by the portable technique.  Aortic calcifications are noted. Elevation the right hemidiaphragm is noted new from the prior exam. No focal infiltrate or sizable effusion is seen. No bony abnormality is noted. IMPRESSION: No active disease. Electronically Signed   By: Inez Catalina M.D.   On: 02/22/2019 14:03    EKG: Independently reviewed.   Assessment/Plan Active Problems:   CAP (community acquired pneumonia)  Bilateral Patchy infiltrates suspicious for pneumonia; SIRS:  Admit for IV antibiotics, IV fluids.  Send urine for legionella antigen and strep pneumonia antigen.  [place] oxygen to keep sats greater than 90% ,. Blood cultures not drawn at admission and antibiotics were already given.     Generalized weakness, no appetite, weight loss and failure to thrive:  Probably sec to lymphoproliferative disorder?  Get PT, OT, Nutrition consult for further eval.  Get TSH, VIT B12 Levels.    Hypertension;  Well controlled resume home meds.    Retroperitoneal  lymphadenopathy and splenic mass; ? Lymphoproliferative disorder vs metastasis IR consulted for biopsy and discussed with Dr Maylon Peppers ,recommended to get CT chest with contrast for further evaluation and to get in touch with oncology once the biopsy results are back.    BPH;  Stable.    GERD:  Stable.   Dementia:  Continue with aricept.    Hyperlipidemia;  Resume lipitor.  Get CK levels and liver enzymes.    Severity of Illness: The appropriate patient status for this patient is INPATIENT. Inpatient status is judged to be reasonable and necessary in order to provide the required intensity of service to ensure the patient's safety. The patient's presenting symptoms, physical exam findings, and initial radiographic and laboratory data in the context of their chronic comorbidities is felt to place them at high risk for further clinical deterioration. Furthermore, it is not anticipated that the patient will be medically stable for discharge from the hospital within 2 midnights of admission.    * I certify that at the point of admission it is my clinical judgment that the patient will require inpatient hospital care spanning beyond 2 midnights from the point of admission due to high intensity of service, high risk for further deterioration and high frequency of surveillance required.*    DVT prophylaxis: Lovenox.  Code Status: Full code.  Family Communication:son at bedside.  Disposition Plan: pending clinical improvement and PT evaluation.  Consults called: discussed the case with Dr Maylon Peppers over the phone ( oncology) Admission status: INPATIENT Karen Chafe  Hosie Poisson MD [facility]

## 2019-02-22 NOTE — Progress Notes (Signed)
Patient having increase confusion and restlessness. Patient attempted to get out of bed x2 and becoming combative with staff . Patient pulling on Tele leds and took IV out. RN reoriented patient and MD notified.

## 2019-02-22 NOTE — ED Notes (Signed)
Son to pts bedside due to pt having mild cognitive impairment.

## 2019-02-22 NOTE — ED Triage Notes (Signed)
Pt brought in for possible dehydration and fatigue. Reports not been eating or drinking much. Denies v/d.  Son is in car if need any further information.

## 2019-02-22 NOTE — ED Notes (Signed)
Pts linen soiled with urine. Pts linen changed. Pericare performed. Pt repositioned in bed. Pt assisted with urinal. NAD noted.

## 2019-02-22 NOTE — Telephone Encounter (Signed)
Patient's son [name] contacted the office and stated that the patient still is very weak, and is in need of IV fluids.  [name] states that they took him to UC, and they refused to do IV fluids on him due to his age.  [name] states that they are currently on the phone with [facility] to see about wait times, because they do not want to take him to the ER for a long period of time due to the [place] pandemic. [name] says that if they are expecting a long wait time at [facility], they are going to call EMS to have the patient evaluated and see about transporting him to the hospital. Will route to Dr. Darnell Level as Juluis Rainier.   Thanks.

## 2019-02-22 NOTE — Progress Notes (Signed)
Patient arrived to the unit. Patient is alert & oriented x1 and very HOH on RA. No distressed noted. Skin assessment document.  Fall precautions initiated. Patient educated on fall precautions and call bell. Comfort care met.

## 2019-02-23 DIAGNOSIS — U071 COVID-19: Secondary | ICD-10-CM | POA: Diagnosis present

## 2019-02-23 DIAGNOSIS — K219 Gastro-esophageal reflux disease without esophagitis: Secondary | ICD-10-CM | POA: Diagnosis present

## 2019-02-23 DIAGNOSIS — N138 Other obstructive and reflux uropathy: Secondary | ICD-10-CM

## 2019-02-23 DIAGNOSIS — E876 Hypokalemia: Secondary | ICD-10-CM

## 2019-02-23 DIAGNOSIS — R161 Splenomegaly, not elsewhere classified: Secondary | ICD-10-CM | POA: Diagnosis present

## 2019-02-23 DIAGNOSIS — J349 Unspecified disorder of nose and nasal sinuses: Secondary | ICD-10-CM | POA: Diagnosis present

## 2019-02-23 DIAGNOSIS — I10 Essential (primary) hypertension: Secondary | ICD-10-CM

## 2019-02-23 DIAGNOSIS — G319 Degenerative disease of nervous system, unspecified: Secondary | ICD-10-CM

## 2019-02-23 DIAGNOSIS — F0390 Unspecified dementia without behavioral disturbance: Secondary | ICD-10-CM | POA: Diagnosis present

## 2019-02-23 DIAGNOSIS — R630 Anorexia: Secondary | ICD-10-CM

## 2019-02-23 DIAGNOSIS — R590 Localized enlarged lymph nodes: Secondary | ICD-10-CM

## 2019-02-23 DIAGNOSIS — Z21 Asymptomatic human immunodeficiency virus [HIV] infection status: Secondary | ICD-10-CM | POA: Diagnosis present

## 2019-02-23 DIAGNOSIS — F03C0 Unspecified dementia, severe, without behavioral disturbance, psychotic disturbance, mood disturbance, and anxiety: Secondary | ICD-10-CM | POA: Diagnosis present

## 2019-02-23 DIAGNOSIS — E785 Hyperlipidemia, unspecified: Secondary | ICD-10-CM | POA: Diagnosis present

## 2019-02-23 DIAGNOSIS — R531 Weakness: Secondary | ICD-10-CM

## 2019-02-23 DIAGNOSIS — H409 Unspecified glaucoma: Secondary | ICD-10-CM

## 2019-02-23 DIAGNOSIS — F05 Delirium due to known physiological condition: Secondary | ICD-10-CM

## 2019-02-23 DIAGNOSIS — N401 Enlarged prostate with lower urinary tract symptoms: Secondary | ICD-10-CM

## 2019-02-23 LAB — CBC
HCT: 39.8 % (ref 39.0–52.0)
Hemoglobin: 13.4 g/dL (ref 13.0–17.0)
MCH: 31.5 pg (ref 26.0–34.0)
MCHC: 33.7 g/dL (ref 30.0–36.0)
MCV: 93.6 fL (ref 80.0–100.0)
Platelets: 170 10*3/uL (ref 150–400)
RBC: 4.25 MIL/uL (ref 4.22–5.81)
RDW: 13.5 % (ref 11.5–15.5)
WBC: 6.1 10*3/uL (ref 4.0–10.5)
nRBC: 0 % (ref 0.0–0.2)

## 2019-02-23 LAB — VITAMIN B12: Vitamin B-12: 564 pg/mL (ref 180–914)

## 2019-02-23 LAB — BASIC METABOLIC PANEL
Anion gap: 10 (ref 5–15)
BUN: 14 mg/dL (ref 8–23)
CO2: 25 mmol/L (ref 22–32)
Calcium: 8.5 mg/dL — ABNORMAL LOW (ref 8.9–10.3)
Chloride: 101 mmol/L (ref 98–111)
Creatinine, Ser: 0.58 mg/dL — ABNORMAL LOW (ref 0.61–1.24)
GFR calc Af Amer: >60 mL/min (ref >60)
GFR calc non Af Amer: >60 mL/min (ref >60)
Glucose, Bld: 107 mg/dL — ABNORMAL HIGH (ref 70–99)
Potassium: 3.1 mmol/L — ABNORMAL LOW (ref 3.5–5.1)
Sodium: 136 mmol/L (ref 135–145)

## 2019-02-23 LAB — URINE CULTURE

## 2019-02-23 LAB — HIV ANTIBODY (ROUTINE TESTING W REFLEX): HIV Screen 4th Generation wRfx: REACTIVE — AB

## 2019-02-23 LAB — C-REACTIVE PROTEIN: CRP: 6.2 mg/dL — ABNORMAL HIGH (ref <1.0)

## 2019-02-23 LAB — LACTATE DEHYDROGENASE: LDH: 215 U/L — ABNORMAL HIGH (ref 98–192)

## 2019-02-23 LAB — D-DIMER, QUANTITATIVE: D-Dimer, Quant: 0.61 ug/mL-FEU — ABNORMAL HIGH (ref 0.00–0.50)

## 2019-02-23 LAB — FIBRINOGEN: Fibrinogen: 589 mg/dL — ABNORMAL HIGH (ref 210–475)

## 2019-02-23 LAB — PROCALCITONIN: Procalcitonin: <0.1 ng/mL

## 2019-02-23 LAB — ABO/RH: ABO/RH(D): A POS

## 2019-02-23 LAB — RPR
RPR Ser Ql: REACTIVE — AB
RPR Titer: 1:1 {titer}

## 2019-02-23 LAB — TSH: TSH: 1.002 u[IU]/mL (ref 0.350–4.500)

## 2019-02-23 LAB — FERRITIN: Ferritin: 772 ng/mL — ABNORMAL HIGH (ref 24–336)

## 2019-02-23 MED ORDER — ZINC SULFATE 220 (50 ZN) MG PO CAPS
220.0000 mg | ORAL_CAPSULE | Freq: Every day | ORAL | Status: DC
  Administered 2019-02-23 – 2019-03-16 (×20): 220 mg via ORAL
  Filled 2019-02-23 (×21): qty 1

## 2019-02-23 MED ORDER — ASCORBIC ACID 500 MG PO TABS
500.0000 mg | ORAL_TABLET | Freq: Every day | ORAL | Status: DC
  Administered 2019-02-23 – 2019-03-16 (×20): 500 mg via ORAL
  Filled 2019-02-23 (×21): qty 1

## 2019-02-23 MED ORDER — HALOPERIDOL LACTATE 5 MG/ML IJ SOLN
2.0000 mg | Freq: Four times a day (QID) | INTRAMUSCULAR | Status: DC | PRN
  Administered 2019-02-23 – 2019-03-06 (×6): 2 mg via INTRAVENOUS
  Filled 2019-02-23 (×6): qty 1

## 2019-02-23 MED ORDER — HALOPERIDOL LACTATE 5 MG/ML IJ SOLN
INTRAMUSCULAR | Status: AC
  Administered 2019-02-23: 5 mg
  Filled 2019-02-23: qty 1

## 2019-02-23 MED ORDER — HALOPERIDOL LACTATE 5 MG/ML IJ SOLN: 5.0000 mg | Freq: Once | INTRAMUSCULAR | Status: AC

## 2019-02-23 MED ORDER — SODIUM CHLORIDE 0.9 % IV SOLN
200.0000 mg | Freq: Once | INTRAVENOUS | Status: AC
  Administered 2019-02-23: 200 mg via INTRAVENOUS
  Filled 2019-02-23: qty 200

## 2019-02-23 MED ORDER — POTASSIUM CHLORIDE IN NACL 40-0.9 MEQ/L-% IV SOLN
INTRAVENOUS | Status: DC
  Administered 2019-02-23 – 2019-02-25 (×4): 75 mL/h via INTRAVENOUS
  Filled 2019-02-23 (×4): qty 1000

## 2019-02-23 MED ORDER — SODIUM CHLORIDE 0.9 % IV SOLN
100.0000 mg | Freq: Every day | INTRAVENOUS | Status: AC
  Administered 2019-02-24 – 2019-02-27 (×4): 100 mg via INTRAVENOUS
  Filled 2019-02-23 (×4): qty 100

## 2019-02-23 MED ORDER — DEXAMETHASONE SODIUM PHOSPHATE 10 MG/ML IJ SOLN
6.0000 mg | Freq: Every day | INTRAMUSCULAR | Status: DC
  Administered 2019-02-23 – 2019-02-26 (×4): 6 mg via INTRAVENOUS
  Filled 2019-02-23 (×4): qty 1

## 2019-02-23 NOTE — Evaluation (Signed)
Pt resting in a low bed with bed alarm on and call light within reach. RN Educated to use call light for assistance, reenforcement needed. Pt unable to follow commands.  Fed about 50% of breakfast without complications. RN Updated son over the phone. No open areas or skin concerns. One on one sitter ordered for safety measures. Will continue to monitor closely.

## 2019-02-23 NOTE — Progress Notes (Addendum)
Progress Note    [name]  [id number] DOB: 11/09/1928  DOA: 02/22/2019 PCP: [name], MD    Brief Narrative:   Chief complaint: Follow-up generalized weakness associated with retroperitoneal lymphadenopathy, splenic mass and pneumonia.  Medical records reviewed and are as summarized below:  [name] is an 86 y.o. male with a PMH of advanced dementia, hypertension, hyperlipidemia, GERD and BPH who was admitted 02/22/2019 with a chief complaint of generalized weakness progressive over several weeks.  In the ED, he had low-grade temperature but was noted to be hypoxic with a room air saturation of 84% associated with tachycardia.  Chest x-ray was clear and CT was negative for acute infarct.  CT of the abdomen and pelvis showed a splenic mass and retroperitoneal adenopathy along with patchy bilateral airspace disease consistent with viral pneumonia.  COVID-19 testing was positive.  Assessment/Plan:   Principal Problem:   Pneumonia due to COVID-19 virus associated with weakness/lab test positive for detection of [id number] virus complicated by delirium and restlessness CT scan personally reviewed and groundglass opacities noted bilateral lung bases.  Maintaining oxygen saturations.  Radiographs reviewed and findings more consistent with a viral pneumonia or inflammatory process so we will discontinue empiric antibiotics.  WBC not elevated.  Check inflammatory markers.  No need to initiate steroids or remdesivir at this time given that the patient is not hypoxic.  Start vitamin C and zinc supplements.  Addendum: LDH elevated at 215, fibrinogen elevated at 589, ferritin elevated at 772, dimer elevated at 0.61, CRP elevated at 6.2.  Most surprisingly, the patient syphilis screen and HIV test came back positive.  Await confirmatory test before breaking the news to his spouse and family.  If he is HIV positive, then lymphoma is likely.  Given elevation of inflammatory markers  with findings noted on CT scan, will start remdesivir and Decadron per standard treatment protocols.  Active Problems:   Dyslipidemia Continue Lipitor.    Glaucoma Continue Azopt and Xalatan.    Essential hypertension Continue Avapro.    Benign prostatic hyperplasia with urinary obstruction Monitor for urinary retention.    Hearing loss Supportive care.    Hypokalemia Add potassium to IV fluids.    Cerebral atrophy (HCC)/advanced dementia with behavioral disturbance Continue Aricept.  Patient has been agitated despite safety mittens and has been pulling at IV lines.  Will request a safety sitter and start Haldol 2 mg every 6 hours as needed for agitation.  Discontinue telemetry as it may be contributing to agitation.    Paranasal sinus disease Noted incidentally on CT scanning.    Retroperitoneal lymphadenopathy/Splenic mass Concerning for lymphoma.  Would obtain palliative care consultation given advanced age and full CODE STATUS.   Nutritional status        Body mass index is 25.44 kg/m.   Family Communication/Anticipated D/C date and plan/Code Status   DVT prophylaxis: Lovenox ordered. Code Status: Full Code.  Discussed with family, and at this time they want full CODE STATUS. Family Communication: Daughter updated by telephone. Disposition Plan: From home, likely will need SNF unless family opts for comfort measures in which case residential hospice might be appropriate.   Medical Consultants:    Palliative Care   Anti-Infectives:    Rocephin/azithromycin 02/22/2019---> 02/23/2019  Subjective:   Mr. Drewett is agitated, pulling at IV lines and condom catheter, attempting to get out of bed.  He is extremely HOH, and unable to respond to my questions.    Objective:  Vitals:   02/22/19 2056 02/23/19 0232 02/23/19 0658 02/23/19 1412  BP:  112/69 127/75 121/62  Pulse:  93 84 80  Resp:  20 20 17   Temp:  97.6 F (36.4 C) 99.2 F (37.3 C) 98.9 F  (37.2 C)  TempSrc:  Oral  Axillary  SpO2: 96% 95% 97% 95%  Weight: 71.5 kg     Height: 5\' 6"  (1.676 m)       Intake/Output Summary (Last 24 hours) at 02/23/2019 1440 Last data filed at 02/23/2019 1300 Gross per 24 hour  Intake 180 ml  Output 600 ml  Net -420 ml   Filed Weights   02/22/19 2056  Weight: 71.5 kg    Exam: General: Restless, agitated. Cardiovascular: Heart sounds show a regular rate, and rhythm. No gallops or rubs. No murmurs. No JVD. Lungs: Diminished throughout. No rales, rhonchi or wheezes. Abdomen: Soft, nontender, nondistended with normal active bowel sounds. No masses. No hepatosplenomegaly. Neurological: Unable to follow commands, MAEx4 spontaneously. Skin: Warm and dry. A few scattered ecchymosis. Extremities: No clubbing or cyanosis. No edema. Pedal pulses 2+. Psychiatric: Delirious.      Data Reviewed:   I have personally reviewed following labs and imaging studies:  Labs: Labs show the following:   Basic Metabolic Panel: Recent Labs  Lab 02/22/19 1123 02/23/19 0322  NA 135 136  K 3.5 3.1*  CL 97* 101  CO2 27 25  GLUCOSE 103* 107*  BUN 20 14  CREATININE 0.71 0.58*  CALCIUM 9.1 8.5*   GFR Estimated Creatinine Clearance: 57.6 mL/min (A) (by C-G formula based on SCr of 0.58 mg/dL (L)). Liver Function Tests: Recent Labs  Lab 02/22/19 1226  AST 43*  ALT 25  ALKPHOS 70  BILITOT 0.7  PROT 6.8  ALBUMIN 3.2*   Recent Labs  Lab 02/22/19 1226  LIPASE 33   CBC: Recent Labs  Lab 02/22/19 1123 02/23/19 0322  WBC 5.5 6.1  HGB 14.6 13.4  HCT 44.0 39.8  MCV 93.8 93.6  PLT 182 170   D-Dimer: Recent Labs    02/23/19 0907  DDIMER 0.61*    Thyroid function studies: Recent Labs    02/23/19 0322  TSH 1.002   Anemia work up: Recent Labs    02/23/19 0322 02/23/19 0907  VITAMINB12 564  --   FERRITIN  --  772*   \ Microbiology Recent Results (from the past 240 hour(s))  Urine culture     Status: Abnormal   Collection  Time: 02/22/19 12:26 PM   Specimen: Urine, Clean Catch  Result Value Ref Range Status   Specimen Description   Final    URINE, CLEAN CATCH Performed at [facility], [place] [address], [place], [place] [address]    Special Requests   Final    NONE Performed at [facility], [place] [address] [place], [place] [address]    Culture MULTIPLE SPECIES PRESENT, SUGGEST RECOLLECTION (A)  Final   Report Status 02/23/2019 FINAL  Final  SARS CORONAVIRUS 2 (TAT 6-24 HRS) Nasopharyngeal Nasopharyngeal Swab     Status: Abnormal   Collection Time: 02/22/19  5:06 PM   Specimen: Nasopharyngeal Swab  Result Value Ref Range Status   SARS Coronavirus 2 POSITIVE (A) NEGATIVE Final    Comment: RESULT CALLED TO, READ BACK BY AND VERIFIED WITH: B. COLES,RN 2343 02/22/2019 T. TYSOR (NOTE) SARS-CoV-2 target nucleic acids are DETECTED. The SARS-CoV-2 RNA is generally detectable in upper and lower respiratory specimens during the acute phase of  infection. Positive results are indicative of the presence of SARS-CoV-2 RNA. Clinical correlation with patient history and other diagnostic information is  necessary to determine patient infection status. Positive results do not rule out bacterial infection or co-infection with other viruses.  The expected result is Negative. Fact Sheet for Patients: SugarRoll.be Fact Sheet for Healthcare Providers: https://www.woods-mathews.com/ This test is not yet approved or cleared by the [place] FDA and  has been authorized for detection and/or diagnosis of SARS-CoV-2 by FDA under an Emergency Use Authorization (EUA). This EUA will remain  in effect (meaning this test can be used) for th e duration of the COVID-19 declaration under Section 564(b)(1) of the Act, 21 U.S.C. section 360bbb-3(b)(1), unless the authorization is terminated or revoked sooner. Performed at [facility], [place]  [address] [place], [place] [address]     Procedures and diagnostic studies:  CT Head Wo Contrast  Result Date: 02/22/2019 CLINICAL DATA:  Altered mental status with fatigue.  Dehydration. EXAM: CT HEAD WITHOUT CONTRAST TECHNIQUE: Contiguous axial images were obtained from the base of the skull through the vertex without intravenous contrast. COMPARISON:  None. FINDINGS: Brain: There is moderate diffuse atrophy. There is no intracranial mass, hemorrhage, extra-axial fluid collection, or midline shift. Brain parenchyma appears unremarkable. No evident acute infarct. Vascular: There is no hyperdense vessel. There are foci of calcification in each carotid siphon region. Skull: Bony calvarium appears intact. Sinuses/Orbits: There is a retention cyst in the inferior posterior right maxillary antrum. There is mucosal thickening and opacification involving multiple ethmoid air cells. Orbits appear symmetric bilaterally except for evidence of previous cataract surgery on the right. Other: Mastoid air cells are clear. IMPRESSION: 1. Atrophy. Brain parenchyma appears unremarkable. No acute infarct. No mass or hemorrhage. 2.  Foci of arterial vascular calcification noted. 3.  Foci of paranasal sinus disease evident. Electronically Signed   By: Lowella Grip III M.D.   On: 02/22/2019 14:57   CT ABDOMEN PELVIS W CONTRAST  Result Date: 02/22/2019 CLINICAL DATA:  Abdominal distension. Anorexia. EXAM: CT ABDOMEN AND PELVIS WITH CONTRAST TECHNIQUE: Multidetector CT imaging of the abdomen and pelvis was performed using the standard protocol following bolus administration of intravenous contrast. CONTRAST:  176mL OMNIPAQUE IOHEXOL 300 MG/ML  SOLN COMPARISON:  03/21/2007 FINDINGS: Lower Chest: Patchy areas of airspace disease are seen bilaterally mainly in the lower lobes, suspicious for viral pneumonia or inflammatory process. No evidence of pleural effusion. Hepatobiliary: No hepatic masses identified. Multiple small  hepatic cysts are stable. Gallbladder is unremarkable. No evidence of biliary ductal dilatation. Pancreas:  No mass or inflammatory changes. Spleen: No evidence of splenomegaly. A new low-attenuation soft tissue mass is seen in the central spleen which measures 3.8 x 3.4 cm. Adrenals/Urinary Tract: Several large renal cysts are again seen bilaterally. No masses identified. No evidence of ureteral calculi or hydronephrosis. Unremarkable unopacified urinary bladder. Stomach/Bowel: No evidence of obstruction, inflammatory process or abnormal fluid collections. Vascular/Lymphatic: New abdominal retroperitoneal lymphadenopathy is seen in the region of the celiac axis and throughout the left paraaortic and aortocaval spaces. Largest conglomeration of lymph nodes in the left paraaortic region measures 4.9 x 3.1 cm on image 43/2. No pathologically enlarged lymph nodes seen within the pelvis. Aortic atherosclerosis incidentally noted. No abdominal aortic aneurysm. Reproductive:  No mass or other significant abnormality identified. Other:  None. Musculoskeletal:  No suspicious bone lesions identified. IMPRESSION: 1. New abdominal retroperitoneal lymphadenopathy and splenic mass. This is likely due to lymphoproliferative disorder, with metastatic disease considered  less likely. 2. Patchy bilateral airspace disease mainly in the lower lobes, suspicious for viral pneumonia or inflammatory process. Pulmonary involvement by lymphoma is considered less likely but cannot be excluded. Recommend clinical correlation and COVID-19 virus testing. Electronically Signed   By: Marlaine Hind M.D.   On: 02/22/2019 15:05   DG Chest Portable 1 View  Result Date: 02/22/2019 CLINICAL DATA:  Increasing shortness of breath over the past 7 days EXAM: PORTABLE CHEST 1 VIEW COMPARISON:  10/17/2003 FINDINGS: Cardiac shadow is accentuated by the portable technique. Aortic calcifications are noted. Elevation the right hemidiaphragm is noted new from  the prior exam. No focal infiltrate or sizable effusion is seen. No bony abnormality is noted. IMPRESSION: No active disease. Electronically Signed   By: Inez Catalina M.D.   On: 02/22/2019 14:03    Medications:   . vitamin C  500 mg Oral Daily  . aspirin EC  81 mg Oral Daily  . atorvastatin  5 mg Oral Daily  . brinzolamide  1 drop Both Eyes TID  . donepezil  10 mg Oral QHS  . enoxaparin (LOVENOX) injection  40 mg Subcutaneous QHS  . irbesartan  150 mg Oral Daily  . latanoprost  1 drop Both Eyes QHS  . zinc sulfate  220 mg Oral Daily   Continuous Infusions: . 0.9 % NaCl with KCl 40 mEq / L 75 mL/hr (02/23/19 0919)     LOS: 1 day   Khaliyah Northrop  [facility]   [facility] How to contact the [facility] Attending or Consulting provider [place] or covering provider during after hours [place], for this patient?  1. Check the care team in [facility] and look for a) attending/consulting TRH provider listed and b) the [facility] team listed 2. Log into [email or web address] and use [place]'s universal password to access. If you do not have the password, please contact the hospital operator. 3. Locate the [facility] provider you are looking for under [facility] and page to a number that you can be directly reached. 4. If you still have difficulty reaching the provider, please page the [facility] (Director on Call) for the Hospitalists listed on amion for assistance.  02/23/2019, 2:40 PM

## 2019-02-23 NOTE — Evaluation (Signed)
Physical Therapy Evaluation Patient Details Name: [name] MRN: [id number] DOB: 10/03/1928 Today's Date: 02/23/2019   History of Present Illness  Pt admitted from home Covid +ve and with hx of dementia  Clinical Impression  Pt admitted as above and presenting with functional mobility limitations 2* generalized weakness, balance deficits and dementia related cognitive deficits.  Pt currently requiring significant assist of 2 to safely perform basic mobility tasks.  Pt would benefit from follow up rehab at SNF level to maximize IND and safety unless family is able to provide level of assist needed at dc.    Follow Up Recommendations SNF    Equipment Recommendations  None recommended by PT    Recommendations for Other Services       Precautions / Restrictions Precautions Precautions: Fall Restrictions Weight Bearing Restrictions: No      Mobility  Bed Mobility Overal bed mobility: Needs Assistance Bed Mobility: Supine to Sit;Sit to Supine     Supine to sit: Mod assist;Max assist;+2 for physical assistance;+2 for safety/equipment Sit to supine: Mod assist;+2 for physical assistance   General bed mobility comments: Pt following no VC but with intermittent  follow through if movement initiated for him  Transfers Overall transfer level: Needs assistance Equipment used: None Transfers: Sit to/from Stand Sit to Stand: Mod assist;Max assist;+2 physical assistance;+2 safety/equipment         General transfer comment: Assist to bring wt up and fwd and to balance in standing.  Attempted x 2 with improved pt participation on second attempt  Ambulation/Gait             General Gait Details: Pt stood x 2 but ambulation not attempted 2* balance deficits and buckling knees with standing at EOB  Stairs            Wheelchair Mobility    Modified Rankin (Stroke Patients Only)       Balance Overall balance assessment: Needs assistance Sitting-balance support:  No upper extremity supported;Feet supported Sitting balance-Leahy Scale: Fair     Standing balance support: Bilateral upper extremity supported Standing balance-Leahy Scale: Poor                               Pertinent Vitals/Pain Pain Assessment: Faces Faces Pain Scale: No hurt    Home Living Family/patient expects to be discharged to:: Unsure Living Arrangements: Children;Spouse/significant other               Additional Comments: Pt unable to provide reliable information 2* dementia    Prior Function                 Hand Dominance        Extremity/Trunk Assessment   Upper Extremity Assessment Upper Extremity Assessment: Difficult to assess due to impaired cognition    Lower Extremity Assessment Lower Extremity Assessment: Difficult to assess due to impaired cognition    Cervical / Trunk Assessment Cervical / Trunk Assessment: Kyphotic  Communication   Communication: HOH  Cognition Arousal/Alertness: Awake/alert Behavior During Therapy: Impulsive;Restless Overall Cognitive Status: History of cognitive impairments - at baseline                                        General Comments      Exercises     Assessment/Plan    PT Assessment Patient needs continued PT  services  PT Problem List Decreased strength;Decreased range of motion;Decreased activity tolerance;Decreased balance;Decreased mobility;Decreased knowledge of use of DME;Decreased cognition;Decreased safety awareness       PT Treatment Interventions DME instruction;Gait training;Stair training;Functional mobility training;Therapeutic activities;Therapeutic exercise;Patient/family education;Balance training    PT Goals (Current goals can be found in the Care Plan section)  Acute Rehab PT Goals Patient Stated Goal: No goals stated PT Goal Formulation: Patient unable to participate in goal setting Time For Goal Achievement: 03/09/19 Potential to Achieve  Goals: Fair    Frequency Min 3X/week   Barriers to discharge        Co-evaluation               AM-PAC PT "6 Clicks" Mobility  Outcome Measure Help needed turning from your back to your side while in a flat bed without using bedrails?: A Lot Help needed moving from lying on your back to sitting on the side of a flat bed without using bedrails?: A Lot Help needed moving to and from a bed to a chair (including a wheelchair)?: A Lot Help needed standing up from a chair using your arms (e.g., wheelchair or bedside chair)?: A Lot Help needed to walk in hospital room?: Total Help needed climbing 3-5 steps with a railing? : Total 6 Click Score: 10    End of Session Equipment Utilized During Treatment: Gait belt Activity Tolerance: Patient limited by fatigue Patient left: in bed;with call bell/phone within reach;with nursing/sitter in room Nurse Communication: Mobility status PT Visit Diagnosis: Unsteadiness on feet (R26.81);Difficulty in walking, not elsewhere classified (R26.2)    Time: [id number] PT Time Calculation (min) (ACUTE ONLY): 22 min   Charges:   PT Evaluation $PT Eval Low Complexity: 1 Low          Debe Coder PT Acute Rehabilitation Services Pager [phone] Office [phone]   Tilda Samudio 02/23/2019, 12:33 PM

## 2019-02-23 NOTE — Progress Notes (Signed)
OT Cancellation Note  Patient Details Name: [name] MRN: [id number] DOB: 08/12/1928   Cancelled Treatment:    Reason Eval/Treat Not Completed: Medical issues which prohibited therapy (pt with agitation and given medication to calm). Will follow.  Malka So 02/23/2019, 2:07 PM  Nestor Lewandowsky, OTR/L Acute Rehabilitation Services Pager: [phone] Office: [phone]

## 2019-02-23 NOTE — Progress Notes (Signed)
Patient trying to remove mitts and pull at iv tubing and catheter tube continuously, also trying to sit up to get OOB although waist belt is currently in place, unable to be redirected, keeps asking for "scissors to cut these off", on-call provider paged, order obtained for one time dose of IV Haldol, given at 0353 over 5 minutes, pt on telemetry, will monitor for effectiveness.

## 2019-02-23 NOTE — Progress Notes (Signed)
Received pt in bed from 5 east wearing level 1 mask, received report from [place], pt Alert to self only, waist restraint in place and bilateral mitts on as well, vss, O2 is 95% on rome air, no resp distress noted, lungs diminished but clear, no cough, abd soft, +bsx4, condon catheter in place and draining to foley bag, antifungal powder applied to bilateral groin and testicles- redness noted, iv site to upper left arm wrapped with kerlix, will initiate iv fluids shortly, low bed in lowest position, floor mats ordered, bed alarm on, remote in reach but unsure if patient will use, will continue to monitor throughout the shift

## 2019-02-24 ENCOUNTER — Inpatient Hospital Stay (HOSPITAL_COMMUNITY): Payer: Medicare Other

## 2019-02-24 DIAGNOSIS — K219 Gastro-esophageal reflux disease without esophagitis: Secondary | ICD-10-CM

## 2019-02-24 LAB — BASIC METABOLIC PANEL
Anion gap: 12 (ref 5–15)
BUN: 12 mg/dL (ref 8–23)
CO2: 23 mmol/L (ref 22–32)
Calcium: 8.7 mg/dL — ABNORMAL LOW (ref 8.9–10.3)
Chloride: 105 mmol/L (ref 98–111)
Creatinine, Ser: 0.47 mg/dL — ABNORMAL LOW (ref 0.61–1.24)
GFR calc Af Amer: >60 mL/min (ref >60)
GFR calc non Af Amer: >60 mL/min (ref >60)
Glucose, Bld: 125 mg/dL — ABNORMAL HIGH (ref 70–99)
Potassium: 3.9 mmol/L (ref 3.5–5.1)
Sodium: 140 mmol/L (ref 135–145)

## 2019-02-24 LAB — CBC
HCT: 41.6 % (ref 39.0–52.0)
Hemoglobin: 13.5 g/dL (ref 13.0–17.0)
MCH: 30.7 pg (ref 26.0–34.0)
MCHC: 32.5 g/dL (ref 30.0–36.0)
MCV: 94.5 fL (ref 80.0–100.0)
Platelets: 197 10*3/uL (ref 150–400)
RBC: 4.4 MIL/uL (ref 4.22–5.81)
RDW: 13.5 % (ref 11.5–15.5)
WBC: 4.5 10*3/uL (ref 4.0–10.5)
nRBC: 0 % (ref 0.0–0.2)

## 2019-02-24 MED ORDER — IOHEXOL 300 MG/ML  SOLN
75.0000 mL | Freq: Once | INTRAMUSCULAR | Status: AC | PRN
  Administered 2019-02-24: 75 mL via INTRAVENOUS

## 2019-02-24 MED ORDER — SODIUM CHLORIDE (PF) 0.9 % IJ SOLN
INTRAMUSCULAR | Status: AC
  Filled 2019-02-24: qty 50

## 2019-02-24 NOTE — Progress Notes (Signed)
Pt resting in low bed with bed alarm on and call light within reach. Tele sitter at bedside. Fed 70% of lunch without complication. Mitts removed. Skin intact with no open areas noted. Pt seems to be less restless and agitated at this time. VSS. No concerns noted. Currently sleeping. Will cont to mx.

## 2019-02-24 NOTE — Evaluation (Signed)
Clinical/Bedside Swallow Evaluation Patient Details  Name: [name] MRN: [id number] Date of Birth: May 22, 1928  Today's Date: 02/24/2019 Time: SLP Start Time (ACUTE ONLY): 48 SLP Stop Time (ACUTE ONLY): [id number] SLP Time Calculation (min) (ACUTE ONLY): 12 min  Past Medical History:  Past Medical History:  Diagnosis Date  . Allergic rhinitis   . BPH (benign prostatic hypertrophy) with urinary obstruction 2000   40gm, L lobe 51mm prostate nodule; followed yearly by Dr Diona Fanti  . GERD (gastroesophageal reflux disease)   . Glaucoma   . Herpes zoster ophthalmicus    history  . History of kidney stones remote   x 1-Ca monhydrate, hyperoxalate urine s/p lithotripsy  . HLD (hyperlipidemia)   . HTN (hypertension)   . Impaired glucose tolerance   . Impaired hearing    uses hearing aides bilaterally  . Osteopenia    Past Surgical History:  Past Surgical History:  Procedure Laterality Date  . APPENDECTOMY  1997  . CATARACT EXTRACTION Right 11/1013   Dr. Venetia Maxon  . COLONOSCOPY  2002   WNL-[facility]; repeat 2012  . COLONOSCOPY  02/2012   tubular adenoma Deatra Ina)  . [place]   left-1955; right-1970  . LITHOTRIPSY  2005  . SBO  2008, 2010   HPI:  [name] is a 86 y.o. male with medical history significant of hypertension, hyperlipidemia, GERD, BPH, was brought in by son for many weeks of generalized weakness, associated with no appetite, weight loss over the last few weeks.  Pt is COVID-19 +.  No prior documented hx of dysphagia.  Head CT on 02/22/19 reported: "Atrophy. Brain parenchyma appears unremarkable. No acute infarct. No mass or hemorrhage. ".  CXR on 12/31 was negative for active disease.     Assessment / Plan / Recommendation Clinical Impression  Pt presents with mild oral dysphagia and suspected functional pharyngeal phase of the swallow.  He denied hx of dysphagia and stated that he was tolerating his current diet without difficulty.  Pt  consumed trials of thin liquid, puree, soft solids, and regular solids.  He was able to feed himself independently given set up and he exhibited good bolus acceptance and consistent hyolaryngeal elevation/excursion to observation with all trials.  Mastication of regular solids was mildly prolonged and pt used an independent liquid wash to soften solids and clear residue from his oral cavity.  Mastication and AP transport were timely with soft and pureed solids.  No clinical s/sx of aspiration were observed with any trials.  Recommend Dysphagia 3 (soft) solids and thin liquids with the following precautions: 1) Small bites/sips 2) Slow rate of intake 3) Sit upright 90 degrees.  SLP will briefly f/u to monitor diet tolerance per POC.   SLP Visit Diagnosis: Dysphagia, oral phase (R13.11)    Aspiration Risk  Mild aspiration risk    Diet Recommendation Dysphagia 3 (Mech soft);Thin liquid   Liquid Administration via: Cup;Straw Medication Administration: Whole meds with liquid Supervision: Patient able to self feed;Intermittent supervision to cue for compensatory strategies Compensations: Minimize environmental distractions;Slow rate;Small sips/bites Postural Changes: Seated upright at 90 degrees    Other  Recommendations Oral Care Recommendations: Oral care BID   Follow up Recommendations None      Frequency and Duration min 1 x/week  2 weeks       Prognosis Prognosis for Safe Diet Advancement: Good Barriers to Reach Goals: Cognitive deficits      Swallow Study   General Date of Onset: 02/23/19 HPI:  [name] is a 86 y.o. male with medical history significant of hyeprtension, hyperlipidemia, GERD, BPH, was brought in by son for many weeks of generalized weakness, associated with no appetite, weight loss over the last few weeks.  Pt is COVID-19 +.  No prior documented hx of dysphagia.  Head CT on 02/22/19 reported: "Atrophy. Brain parenchyma appears unremarkable. No acute infarct. No  mass or hemorrhage. ".  CXR on 12/31 was negative for active disease.   Type of Study: Bedside Swallow Evaluation Previous Swallow Assessment: None  Diet Prior to this Study: Regular;Thin liquids Temperature Spikes Noted: Yes Respiratory Status: Room air History of Recent Intubation: No Behavior/Cognition: Alert;Cooperative;Pleasant mood;Confused Oral Cavity Assessment: Within Functional Limits Oral Care Completed by SLP: No Oral Cavity - Dentition: Adequate natural dentition Vision: Functional for self-feeding Self-Feeding Abilities: Able to feed self;Needs set up Patient Positioning: Upright in chair Baseline Vocal Quality: Normal    Oral/Motor/Sensory Function Overall Oral Motor/Sensory Function: Within functional limits   Ice Chips Ice chips: Not tested   Thin Liquid Thin Liquid: Within functional limits    Nectar Thick Nectar Thick Liquid: Not tested   Honey Thick Honey Thick Liquid: Not tested   Puree Puree: Within functional limits   Solid     Solid: Impaired Presentation: Self Fed Oral Phase Impairments: Impaired mastication Oral Phase Functional Implications: Prolonged oral transit;Impaired mastication;Oral residue     Colin Mulders M.S., CCC-SLP Acute Rehabilitation Services Office: [phone]  [name] 02/24/2019,4:12 PM

## 2019-02-24 NOTE — Progress Notes (Addendum)
Progress Note    [name]  [id number] DOB: 1928/06/09  DOA: 02/22/2019 PCP: [name], MD    Brief Narrative:   Chief complaint: Follow-up generalized weakness associated with retroperitoneal lymphadenopathy, splenic mass and pneumonia.  Medical records reviewed and are as summarized below:  [name] is an 86 y.o. male with a PMH of advanced dementia, hypertension, hyperlipidemia, GERD and BPH who was admitted 02/22/2019 with a chief complaint of generalized weakness progressive over several weeks.  In the ED, he had low-grade temperature but was noted to be hypoxic with a room air saturation of 84% associated with tachycardia.  Chest x-ray was clear and CT was negative for acute infarct.  CT of the abdomen and pelvis showed a splenic mass and retroperitoneal adenopathy along with patchy bilateral airspace disease consistent with viral pneumonia.  COVID-19 testing was positive as was RPR and HIV testing (awaiting confirmatory tests).  Assessment/Plan:   Principal Problem:   Pneumonia due to COVID-19 virus associated with weakness/lab test positive for detection of [id number] virus complicated by delirium and restlessness CT scan personally reviewed and groundglass opacities noted bilateral lung bases.  Maintaining oxygen saturations.  Radiographs reviewed and findings more consistent with a viral pneumonia or inflammatory process so antibiotics discontinued 02/23/2019.  WBC not elevated.  Started on remdesivir and Decadron 02/23/2019 after inflammatory markers checked and found to be elevated.  Screening HIV and syphilis tests positive Patient underwent syphilis and HIV screening on admission.  CD4 count and viral load pending.  Treponema pallidum antibody titer pending.  HIV confirmatory test pending.  Have not discussed this with family yet.  No answer when I called today.  Active Problems:   Dyslipidemia Continue Lipitor.    Glaucoma Continue Azopt and  Xalatan.    Essential hypertension Continue Avapro.    Benign prostatic hyperplasia with urinary obstruction No evidence of urinary retention.    Hearing loss Supportive care.    Hypokalemia Corrected with adding potassium to IV fluids.    Cerebral atrophy (HCC)/advanced dementia with behavioral disturbance Continue Aricept.  Much calmer today and agitation has resolved.      Paranasal sinus disease Noted incidentally on CT scanning.    Retroperitoneal lymphadenopathy/Splenic mass Concerning for lymphoma.  Would obtain palliative care consultation given advanced age and full CODE STATUS.  IR consulted on admission for CT-guided biopsy.    Protein calorie malnutrition Dietitian consultation requested to assess nutritional needs.        Body mass index is 25.44 kg/m.   Family Communication/Anticipated D/C date and plan/Code Status   DVT prophylaxis: Lovenox ordered. Code Status: Full Code.  Discussed with family, and at this time they want full CODE STATUS. Family Communication: Daughter updated by telephone 02/23/18. Knows about HIV/syphillis screen + and that we are awaiting confirmatory testing.  Daughter is currently bringing her mother (the patient spouse) to the hospital as she also has been diagnosed with Covid. Disposition Plan: From home.  PT recommending SNF placement.  Awaiting further diagnostic test results to determine overall prognosis before making recommendations.  Medical Consultants:    Palliative Care   Anti-Infectives:    Rocephin/azithromycin 02/22/2019---> 02/23/2019  Subjective:   [name] is much better today.  Sitting up, awake and alert.  HOH, but answers questions appropriately when he understands them.  Still has safety mittens on.  Denies SOB, cough.     Objective:    Vitals:   02/23/19 [id number] 02/23/19 1412 02/23/19 1955 02/24/19 [id number]  BP: 127/75 121/62 130/79 125/78  Pulse: 84 80 94 69  Resp: 20 17 20 20   Temp: 99.2 F (37.3 C)  98.9 F (37.2 C) 98.5 F (36.9 C) 98 F (36.7 C)  TempSrc:  Axillary Axillary Oral  SpO2: 97% 95% 97% 96%  Weight:      Height:        Intake/Output Summary (Last 24 hours) at 02/24/2019 0754 Last data filed at 02/24/2019 [id number] Gross per 24 hour  Intake 1131.85 ml  Output 2475 ml  Net -1343.15 ml   Filed Weights   02/22/19 2056  Weight: 71.5 kg    Exam: General: No acute distress.  Cardiovascular: Heart sounds show a regular rate, and rhythm. No gallops or rubs. No murmurs. No JVD. Lungs: Clear to auscultation bilaterally with good air movement. No rales, rhonchi or wheezes. Abdomen: Soft, nontender, nondistended with normal active bowel sounds. No masses. No hepatosplenomegaly. Neurological: Alert. Non-focal. Skin: Warm and dry. No rashes or lesions. Extremities: No clubbing or cyanosis. No edema. Pedal pulses 2+. Psychiatric: Mood and affect are normal. Insight and judgment are impaired.       Data Reviewed:   I have personally reviewed following labs and imaging studies:  Labs: Labs show the following:   Basic Metabolic Panel: Recent Labs  Lab 02/22/19 1123 02/23/19 0322 02/24/19 0221  NA 135 136 140  K 3.5 3.1* 3.9  CL 97* 101 105  CO2 27 25 23   GLUCOSE 103* 107* 125*  BUN 20 14 12   CREATININE 0.71 0.58* 0.47*  CALCIUM 9.1 8.5* 8.7*   GFR Estimated Creatinine Clearance: 57.6 mL/min (A) (by C-G formula based on SCr of 0.47 mg/dL (L)). Liver Function Tests: Recent Labs  Lab 02/22/19 1226  AST 43*  ALT 25  ALKPHOS 70  BILITOT 0.7  PROT 6.8  ALBUMIN 3.2*   Recent Labs  Lab 02/22/19 1226  LIPASE 33   CBC: Recent Labs  Lab 02/22/19 1123 02/23/19 0322 02/24/19 0221  WBC 5.5 6.1 4.5  HGB 14.6 13.4 13.5  HCT 44.0 39.8 41.6  MCV 93.8 93.6 94.5  PLT 182 170 197   D-Dimer: Recent Labs    02/23/19 0907  DDIMER 0.61*    Thyroid function studies: Recent Labs    02/23/19 0322  TSH 1.002   Anemia work up: Recent Labs     02/23/19 0322 02/23/19 0907  VITAMINB12 564  --   FERRITIN  --  772*   \ Microbiology Recent Results (from the past 240 hour(s))  Urine culture     Status: Abnormal   Collection Time: 02/22/19 12:26 PM   Specimen: Urine, Clean Catch  Result Value Ref Range Status   Specimen Description   Final    URINE, CLEAN CATCH Performed at [facility], [place] [address], [place], [place] [address]    Special Requests   Final    NONE Performed at [facility], [place] [address] [place], [place] [address]    Culture MULTIPLE SPECIES PRESENT, SUGGEST RECOLLECTION (A)  Final   Report Status 02/23/2019 FINAL  Final  SARS CORONAVIRUS 2 (TAT 6-24 HRS) Nasopharyngeal Nasopharyngeal Swab     Status: Abnormal   Collection Time: 02/22/19  5:06 PM   Specimen: Nasopharyngeal Swab  Result Value Ref Range Status   SARS Coronavirus 2 POSITIVE (A) NEGATIVE Final    Comment: RESULT CALLED TO, READ BACK BY AND VERIFIED WITH: B. COLES,RN 2343 02/22/2019 T. TYSOR (NOTE) SARS-CoV-2 target nucleic acids are  DETECTED. The SARS-CoV-2 RNA is generally detectable in upper and lower respiratory specimens during the acute phase of infection. Positive results are indicative of the presence of SARS-CoV-2 RNA. Clinical correlation with patient history and other diagnostic information is  necessary to determine patient infection status. Positive results do not rule out bacterial infection or co-infection with other viruses.  The expected result is Negative. Fact Sheet for Patients: SugarRoll.be Fact Sheet for Healthcare Providers: https://www.woods-mathews.com/ This test is not yet approved or cleared by the [place] FDA and  has been authorized for detection and/or diagnosis of SARS-CoV-2 by FDA under an Emergency Use Authorization (EUA). This EUA will remain  in effect (meaning this test can be used) for th e duration of the COVID-19  declaration under Section 564(b)(1) of the Act, 21 U.S.C. section 360bbb-3(b)(1), unless the authorization is terminated or revoked sooner. Performed at [facility], [place] [address] [place], [place] [address]     Procedures and diagnostic studies:  CT Head Wo Contrast  Result Date: 02/22/2019 CLINICAL DATA:  Altered mental status with fatigue.  Dehydration. EXAM: CT HEAD WITHOUT CONTRAST TECHNIQUE: Contiguous axial images were obtained from the base of the skull through the vertex without intravenous contrast. COMPARISON:  None. FINDINGS: Brain: There is moderate diffuse atrophy. There is no intracranial mass, hemorrhage, extra-axial fluid collection, or midline shift. Brain parenchyma appears unremarkable. No evident acute infarct. Vascular: There is no hyperdense vessel. There are foci of calcification in each carotid siphon region. Skull: Bony calvarium appears intact. Sinuses/Orbits: There is a retention cyst in the inferior posterior right maxillary antrum. There is mucosal thickening and opacification involving multiple ethmoid air cells. Orbits appear symmetric bilaterally except for evidence of previous cataract surgery on the right. Other: Mastoid air cells are clear. IMPRESSION: 1. Atrophy. Brain parenchyma appears unremarkable. No acute infarct. No mass or hemorrhage. 2.  Foci of arterial vascular calcification noted. 3.  Foci of paranasal sinus disease evident. Electronically Signed   By: Lowella Grip III M.D.   On: 02/22/2019 14:57   CT ABDOMEN PELVIS W CONTRAST  Result Date: 02/22/2019 CLINICAL DATA:  Abdominal distension. Anorexia. EXAM: CT ABDOMEN AND PELVIS WITH CONTRAST TECHNIQUE: Multidetector CT imaging of the abdomen and pelvis was performed using the standard protocol following bolus administration of intravenous contrast. CONTRAST:  120mL OMNIPAQUE IOHEXOL 300 MG/ML  SOLN COMPARISON:  03/21/2007 FINDINGS: Lower Chest: Patchy areas of airspace disease are seen  bilaterally mainly in the lower lobes, suspicious for viral pneumonia or inflammatory process. No evidence of pleural effusion. Hepatobiliary: No hepatic masses identified. Multiple small hepatic cysts are stable. Gallbladder is unremarkable. No evidence of biliary ductal dilatation. Pancreas:  No mass or inflammatory changes. Spleen: No evidence of splenomegaly. A new low-attenuation soft tissue mass is seen in the central spleen which measures 3.8 x 3.4 cm. Adrenals/Urinary Tract: Several large renal cysts are again seen bilaterally. No masses identified. No evidence of ureteral calculi or hydronephrosis. Unremarkable unopacified urinary bladder. Stomach/Bowel: No evidence of obstruction, inflammatory process or abnormal fluid collections. Vascular/Lymphatic: New abdominal retroperitoneal lymphadenopathy is seen in the region of the celiac axis and throughout the left paraaortic and aortocaval spaces. Largest conglomeration of lymph nodes in the left paraaortic region measures 4.9 x 3.1 cm on image 43/2. No pathologically enlarged lymph nodes seen within the pelvis. Aortic atherosclerosis incidentally noted. No abdominal aortic aneurysm. Reproductive:  No mass or other significant abnormality identified. Other:  None. Musculoskeletal:  No suspicious bone lesions identified. IMPRESSION: 1.  New abdominal retroperitoneal lymphadenopathy and splenic mass. This is likely due to lymphoproliferative disorder, with metastatic disease considered less likely. 2. Patchy bilateral airspace disease mainly in the lower lobes, suspicious for viral pneumonia or inflammatory process. Pulmonary involvement by lymphoma is considered less likely but cannot be excluded. Recommend clinical correlation and COVID-19 virus testing. Electronically Signed   By: Marlaine Hind M.D.   On: 02/22/2019 15:05   DG Chest Portable 1 View  Result Date: 02/22/2019 CLINICAL DATA:  Increasing shortness of breath over the past 7 days EXAM: PORTABLE  CHEST 1 VIEW COMPARISON:  10/17/2003 FINDINGS: Cardiac shadow is accentuated by the portable technique. Aortic calcifications are noted. Elevation the right hemidiaphragm is noted new from the prior exam. No focal infiltrate or sizable effusion is seen. No bony abnormality is noted. IMPRESSION: No active disease. Electronically Signed   By: Inez Catalina M.D.   On: 02/22/2019 14:03    Medications:   . vitamin C  500 mg Oral Daily  . aspirin EC  81 mg Oral Daily  . atorvastatin  5 mg Oral Daily  . brinzolamide  1 drop Both Eyes TID  . dexamethasone (DECADRON) injection  6 mg Intravenous Daily  . donepezil  10 mg Oral QHS  . enoxaparin (LOVENOX) injection  40 mg Subcutaneous QHS  . irbesartan  150 mg Oral Daily  . latanoprost  1 drop Both Eyes QHS  . zinc sulfate  220 mg Oral Daily   Continuous Infusions: . 0.9 % NaCl with KCl 40 mEq / L 75 mL/hr at 02/24/19 0605  . remdesivir 100 mg in NS 100 mL       LOS: 2 days   Margreta Journey Draylon Mercadel  [facility]   [facility] How to contact the [facility] Attending or Consulting provider [place] or covering provider during after hours [place], for this patient?  1. Check the care team in [facility] and look for a) attending/consulting [facility] provider listed and b) the [facility] team listed 2. Log into [email or web address] and use [place]'s universal password to access. If you do not have the password, please contact the hospital operator. 3. Locate the [facility] provider you are looking for under [facility] and page to a number that you can be directly reached. 4. If you still have difficulty reaching the provider, please page the [facility] (Director on Call) for the Hospitalists listed on amion for assistance.  02/24/2019, 7:54 AM

## 2019-02-24 NOTE — Evaluation (Signed)
Occupational Therapy Evaluation Patient Details Name: [name] MRN: [id number] DOB: 13-May-1928 Today's Date: 02/24/2019    History of Present Illness Pt admitted from home Covid +ve and with hx of dementia   Clinical Impression   Pt from home with wife and adult child (attempted calling and no answer at home number). Pt today is mod A for bed mobility, able to perform SPT at mod A +2 to [facility]. While on the commode able to engage in grooming tasks with min A for task initiation (verbal and visual cues). Max A for rear peri care (one person to assist with stand from the front and one to perform peri care) and mod A +2 for SPT to the recliner from [facility]. Pt pleasant, cooperative throughout session. Motivated to move around and change positions. NT assisting throughout session. OT will continue to follow acutely and attempt to contact family to establish PLOF. Significant improvement from PT evaluation yesterday. Currently recommending SNF level care post-acute - but if continued improvement and after communication with family could potentially progress to [facility]?    Follow Up Recommendations  SNF;Supervision/Assistance - 24 hour    Equipment Recommendations  Other (comment)(defer to next venue)    Recommendations for Other Services       Precautions / Restrictions Precautions Precautions: Fall Restrictions Weight Bearing Restrictions: No      Mobility Bed Mobility Overal bed mobility: Needs Assistance Bed Mobility: Supine to Sit     Supine to sit: Mod assist;HOB elevated     General bed mobility comments: assit to bring legs off edge of bed, and assist with trunk elevation  Transfers Overall transfer level: Needs assistance Equipment used: 2 person hand held assist;1 person hand held assist;Rolling walker (2 wheeled) Transfers: Sit to/from Stand Sit to Stand: Mod assist;Max assist;+2 physical assistance;+2 safety/equipment         General transfer comment: from bed>BSC mod A  +2 face to face hand held assist; max A one person face to face to stand for approx 45 seconds for rear peri care; mod A +2 for transfer from [facility] to recliner    Balance Overall balance assessment: Needs assistance Sitting-balance support: No upper extremity supported;Feet supported Sitting balance-Leahy Scale: Fair     Standing balance support: Bilateral upper extremity supported Standing balance-Leahy Scale: Poor Standing balance comment: dependent on external assist from staff                           ADL either performed or assessed with clinical judgement   ADL Overall ADL's : Needs assistance/impaired Eating/Feeding: Moderate assistance   Grooming: Wash/dry hands;Wash/dry face;Oral care;Brushing hair;Minimal assistance;Sitting;Cueing for sequencing Grooming Details (indicate cue type and reason): min cues to initiate task, Pt able to complete without assist Upper Body Bathing: Moderate assistance   Lower Body Bathing: Moderate assistance   Upper Body Dressing : Moderate assistance   Lower Body Dressing: Maximal assistance   Toilet Transfer: Moderate assistance;+2 for physical assistance;+2 for safety/equipment;Stand-pivot(face to face) Toilet Transfer Details (indicate cue type and reason): 2 person face to face from elevated bed to [place] and Hygiene: Maximal assistance;+2 for physical assistance;+2 for safety/equipment;Sit to/from stand Toileting - Clothing Manipulation Details (indicate cue type and reason): one person face to face in front, and one person for peri care. Pt able to maintain standing for approx 45 seconds     Functional mobility during ADLs: Moderate assistance;Maximal assistance;+2 for physical assistance;+2 for safety/equipment(SPT only)  General ADL Comments: Pt very happy after completing grooming tasks, very cooperative and pleasant throughout session     Vision Baseline Vision/History: Wears glasses Wears  Glasses: At all times Patient Visual Report: No change from baseline       Perception     Praxis      Pertinent Vitals/Pain Pain Assessment: No/denies pain     Hand Dominance Right   Extremity/Trunk Assessment Upper Extremity Assessment Upper Extremity Assessment: Overall WFL for tasks assessed   Lower Extremity Assessment Lower Extremity Assessment: Generalized weakness   Cervical / Trunk Assessment Cervical / Trunk Assessment: Kyphotic   Communication Communication Communication: HOH   Cognition Arousal/Alertness: Awake/alert Behavior During Therapy: WFL for tasks assessed/performed Overall Cognitive Status: History of cognitive impairments - at baseline                                 General Comments: able to follow commands, perform ADL with min visual/verbal cues   General Comments       Exercises     Shoulder Instructions      Home Living Family/patient expects to be discharged to:: Unsure Living Arrangements: Children;Spouse/significant other                               Additional Comments: Pt unable to provide reliable information/history      Prior Functioning/Environment                   OT Problem List: Decreased activity tolerance;Decreased strength;Impaired balance (sitting and/or standing);Decreased knowledge of use of DME or AE;Cardiopulmonary status limiting activity      OT Treatment/Interventions: Self-care/ADL training;Therapeutic exercise;DME and/or AE instruction;Therapeutic activities;Patient/family education;Balance training    OT Goals(Current goals can be found in the care plan section) Acute Rehab OT Goals Patient Stated Goal: No goals stated OT Goal Formulation: Patient unable to participate in goal setting  OT Frequency: Min 2X/week   Barriers to D/C:            Co-evaluation              AM-PAC OT "6 Clicks" Daily Activity     Outcome Measure Help from another person eating  meals?: A Lot Help from another person taking care of personal grooming?: A Little Help from another person toileting, which includes using toliet, bedpan, or urinal?: A Lot Help from another person bathing (including washing, rinsing, drying)?: A Lot Help from another person to put on and taking off regular upper body clothing?: A Little Help from another person to put on and taking off regular lower body clothing?: A Lot 6 Click Score: 14   End of Session Equipment Utilized During Treatment: Gait belt Nurse Communication: Mobility status  Activity Tolerance: Patient tolerated treatment well Patient left: in chair;with call bell/phone within reach;with chair alarm set;with nursing/sitter in room(tele sitter)  OT Visit Diagnosis: Unsteadiness on feet (R26.81);Other abnormalities of gait and mobility (R26.89);Muscle weakness (generalized) (M62.81);Other symptoms and signs involving cognitive function                Time: 1521-1545 OT Time Calculation (min): 24 min Charges:  OT General Charges $OT Visit: 1 Visit OT Evaluation $OT Eval Moderate Complexity: 1 Mod OT Treatments $Self Care/Home Management : 8-22 mins  Jesse Sans OTR/L Acute Rehabilitation Services Pager: [phone] Office: [place] 02/24/2019, 4:52 PM

## 2019-02-24 NOTE — Progress Notes (Signed)
Patient transported to CT via bed with RN and NT assistance at 2145. CT accomplished, and patient returned to room, at 2205, bed alarm on, tele sitter in place, bed at lowest position.

## 2019-02-24 NOTE — Progress Notes (Signed)
Yellow MEWS related to LOC. This is not an acute change for this patient. Will cont to mx closely.

## 2019-02-25 LAB — LEGIONELLA PNEUMOPHILA SEROGP 1 UR AG: L. pneumophila Serogp 1 Ur Ag: NEGATIVE

## 2019-02-25 LAB — HIV-1 RNA QUANT-NO REFLEX-BLD
HIV 1 RNA Quant: <20 copies/mL
LOG10 HIV-1 RNA: UNDETERMINED log10copy/mL

## 2019-02-25 MED ORDER — POLYETHYLENE GLYCOL 3350 17 G PO PACK
17.0000 g | PACK | Freq: Every day | ORAL | Status: DC
  Administered 2019-02-25 – 2019-03-16 (×14): 17 g via ORAL
  Filled 2019-02-25 (×16): qty 1

## 2019-02-25 MED ORDER — BISACODYL 10 MG RE SUPP
10.0000 mg | Freq: Once | RECTAL | Status: AC
  Administered 2019-02-25: 10 mg via RECTAL
  Filled 2019-02-25: qty 1

## 2019-02-25 MED ORDER — SENNOSIDES-DOCUSATE SODIUM 8.6-50 MG PO TABS
1.0000 | ORAL_TABLET | Freq: Two times a day (BID) | ORAL | Status: DC
  Administered 2019-02-25 – 2019-03-16 (×32): 1 via ORAL
  Filled 2019-02-25 (×35): qty 1

## 2019-02-25 NOTE — Progress Notes (Signed)
Pt resting in a low bed with bed alarm on and call light within reach. VSS. No pain at this time. Pt following simple commands. Alert and awake. SpO2 96% on room air. Appetite poor for lunch but ate 80% of breakfast. Pt spoke with son on the phone this AM. No concerns at this time. Will cont to mx.

## 2019-02-25 NOTE — Progress Notes (Signed)
Progress Note    [name]  [id number] DOB: May 21, 1928  DOA: 02/22/2019 PCP: [name], MD    Brief Narrative:   Chief complaint: Follow-up generalized weakness associated with retroperitoneal lymphadenopathy, splenic mass and pneumonia.  Medical records reviewed and are as summarized below:  [name] is an 86 y.o. male with a PMH of advanced dementia, hypertension, hyperlipidemia, GERD and BPH who was admitted 02/22/2019 with a chief complaint of generalized weakness progressive over several weeks.  In the ED, he had low-grade temperature but was noted to be hypoxic with a room air saturation of 84% associated with tachycardia.  Chest x-ray was clear and CT was negative for acute infarct.  CT of the abdomen and pelvis showed a splenic mass and retroperitoneal adenopathy along with patchy bilateral airspace disease consistent with viral pneumonia.  COVID-19 testing was positive as was RPR and HIV testing (awaiting confirmatory tests).  Assessment/Plan:   Principal Problem:   Pneumonia due to COVID-19 virus associated with weakness/lab test positive for detection of [id number] virus complicated by delirium and restlessness CT scan personally reviewed and groundglass opacities noted bilateral lung bases.  Maintaining oxygen saturations.  Radiographs reviewed and findings more consistent with a viral pneumonia or inflammatory process so antibiotics discontinued 02/23/2019.  WBC not elevated.  Started on remdesivir and Decadron 02/23/2019 after inflammatory markers checked and found to be elevated. Will recheck the labs tomorrow.  Screening HIV and syphilis tests positive Patient underwent syphilis and HIV screening on admission.  CD4 count and viral load pending.  Treponema pallidum antibody titer pending.  HIV confirmatory test pending.  discussed with daughter.  Informed that Treponema pallidum is still pending.  HIV is negative.  Extensive retroperitoneal, para-aortic,  as well as mediastinal lymphadenopathy. Goals of care discussion. Presenting with poor p.o. intake. A CT abdomen on the time of the admission shows evidence of abdominal lymphadenopathy as well as splenic node. CT chest also shows supraclavicular as well as hilar lymphadenopathy. IR consulted for biopsy. Dr. Elwyn Reach from oncology was initially called At present as the patient is already on steroids I am not sure whether we will be able to get good results on FNAC. Patient may require full lymph node biopsy to get accurate diagnosis. Also discussed with patient's daughter that the patient may not be a great candidate for chemotherapy and may not tolerate the regimen. Patient's wife who is the POA has informed patient's daughter that do everything that you can conceivably do for the patient at present. Patient's wife is also in the hospital currently with Covid pneumonia. Palliative care will be consulted for further assistance.  Active Problems:   Dyslipidemia Continue Lipitor.    Glaucoma Continue Azopt and Xalatan.    Essential hypertension Continue Avapro.    Benign prostatic hyperplasia with urinary obstruction No evidence of urinary retention.    Hearing loss Supportive care.    Hypokalemia Corrected with adding potassium to IV fluids.    Cerebral atrophy (HCC)/advanced dementia with behavioral disturbance Continue Aricept.  Much calmer today and agitation has resolved.      Paranasal sinus disease Noted incidentally on CT scanning.    Retroperitoneal lymphadenopathy/Splenic mass Concerning for lymphoma.  Would obtain palliative care consultation given advanced age and full CODE STATUS.  IR consulted on admission for CT-guided biopsy.    Protein calorie malnutrition Dietitian consultation requested to assess nutritional needs.        Body mass index is 25.44 kg/m.   Family  Communication/Anticipated D/C date and plan/Code Status   DVT prophylaxis: Lovenox ordered.  Code Status: Full Code.  Discussed with family, and at this time they want full CODE STATUS. Family Communication: Daughter updated by telephone 02/24/2018.  Notes of the confirmatory testing for the HIV and syphilis are still pending.  Disposition Plan: From home.  PT recommending SNF placement.  Awaiting further diagnostic test results to determine overall prognosis before making recommendations.  Medical Consultants:    Palliative Care   Anti-Infectives:    Rocephin/azithromycin 02/22/2019---> 02/23/2019  Subjective:   Hard of hearing.  Follows simple commands.  No acute events.  No nausea or vomiting.  Objective:    Vitals:   02/24/19 0517 02/24/19 1516 02/25/19 0450 02/25/19 1340  BP: 125/78 111/68 (!) 142/83 114/73  Pulse: 69 75 70 77  Resp: 20 16 18 18   Temp: 98 F (36.7 C) 97.8 F (36.6 C) 97.9 F (36.6 C) 98.7 F (37.1 C)  TempSrc: Oral Oral Oral Oral  SpO2: 96% 96% 99% 97%  Weight:      Height:        Intake/Output Summary (Last 24 hours) at 02/25/2019 1925 Last data filed at 02/25/2019 1500 Gross per 24 hour  Intake 1293.94 ml  Output 675 ml  Net 618.94 ml   Filed Weights   02/22/19 2056  Weight: 71.5 kg    Exam: General: No acute distress.  Cardiovascular: Heart sounds show a regular rate, and rhythm. No gallops or rubs. No murmurs. No JVD. Lungs: Clear to auscultation bilaterally with good air movement. No rales, rhonchi or wheezes. Abdomen: Soft, nontender, nondistended with normal active bowel sounds. No masses. No hepatosplenomegaly. Neurological: Alert. Non-focal. Skin: Warm and dry. No rashes or lesions. Extremities: No clubbing or cyanosis. No edema. Pedal pulses 2+. Psychiatric: Mood and affect are normal. Insight and judgment are impaired.       Data Reviewed:   I have personally reviewed following labs and imaging studies:  Labs: Labs show the following:   Basic Metabolic Panel: Recent Labs  Lab 02/22/19 1123 02/23/19 0322  02/24/19 0221  NA 135 136 140  K 3.5 3.1* 3.9  CL 97* 101 105  CO2 27 25 23   GLUCOSE 103* 107* 125*  BUN 20 14 12   CREATININE 0.71 0.58* 0.47*  CALCIUM 9.1 8.5* 8.7*   GFR Estimated Creatinine Clearance: 57.6 mL/min (A) (by C-G formula based on SCr of 0.47 mg/dL (L)). Liver Function Tests: Recent Labs  Lab 02/22/19 1226  AST 43*  ALT 25  ALKPHOS 70  BILITOT 0.7  PROT 6.8  ALBUMIN 3.2*   Recent Labs  Lab 02/22/19 1226  LIPASE 33   CBC: Recent Labs  Lab 02/22/19 1123 02/23/19 0322 02/24/19 0221  WBC 5.5 6.1 4.5  HGB 14.6 13.4 13.5  HCT 44.0 39.8 41.6  MCV 93.8 93.6 94.5  PLT 182 170 197   D-Dimer: Recent Labs    02/23/19 0907  DDIMER 0.61*    Thyroid function studies: Recent Labs    02/23/19 0322  TSH 1.002   Anemia work up: Recent Labs    02/23/19 0322 02/23/19 0907  VITAMINB12 564  --   FERRITIN  --  772*   \ Microbiology Recent Results (from the past 240 hour(s))  Urine culture     Status: Abnormal   Collection Time: 02/22/19 12:26 PM   Specimen: Urine, Clean Catch  Result Value Ref Range Status   Specimen Description   Final    URINE, CLEAN  CATCH Performed at [facility], [place] [address] [place], [place] [address]    Special Requests   Final    NONE Performed at [facility], [place] [address] [place], [place] [address]    Culture MULTIPLE SPECIES PRESENT, SUGGEST RECOLLECTION (A)  Final   Report Status 02/23/2019 FINAL  Final  SARS CORONAVIRUS 2 (TAT 6-24 HRS) Nasopharyngeal Nasopharyngeal Swab     Status: Abnormal   Collection Time: 02/22/19  5:06 PM   Specimen: Nasopharyngeal Swab  Result Value Ref Range Status   SARS Coronavirus 2 POSITIVE (A) NEGATIVE Final    Comment: RESULT CALLED TO, READ BACK BY AND VERIFIED WITH: B. COLES,RN 2343 02/22/2019 T. TYSOR (NOTE) SARS-CoV-2 target nucleic acids are DETECTED. The SARS-CoV-2 RNA is generally detectable in upper and lower respiratory  specimens during the acute phase of infection. Positive results are indicative of the presence of SARS-CoV-2 RNA. Clinical correlation with patient history and other diagnostic information is  necessary to determine patient infection status. Positive results do not rule out bacterial infection or co-infection with other viruses.  The expected result is Negative. Fact Sheet for Patients: SugarRoll.be Fact Sheet for Healthcare Providers: https://www.woods-mathews.com/ This test is not yet approved or cleared by the [place] FDA and  has been authorized for detection and/or diagnosis of SARS-CoV-2 by FDA under an Emergency Use Authorization (EUA). This EUA will remain  in effect (meaning this test can be used) for th e duration of the COVID-19 declaration under Section 564(b)(1) of the Act, 21 U.S.C. section 360bbb-3(b)(1), unless the authorization is terminated or revoked sooner. Performed at [facility], [place] [address], [place], [place] [address]     Procedures and diagnostic studies:  CT CHEST W CONTRAST  Result Date: 02/24/2019 CLINICAL DATA:  Provided history of colon cancer of unknown primary, surveillance Technologist notes state pneumonia due to COVID-19. EXAM: CT CHEST WITH CONTRAST TECHNIQUE: Multidetector CT imaging of the chest was performed during intravenous contrast administration. CONTRAST:  27mL OMNIPAQUE IOHEXOL 300 MG/ML  SOLN COMPARISON:  Chest radiograph 02/22/2019. Abdominal CT 02/12/2019. No remote prior chest CT. FINDINGS: Cardiovascular: Aortic atherosclerosis. No aneurysm or dissection. Coronary artery calcifications. Normal heart size. No pericardial effusion. Mediastinum/Nodes: Probable enlarged right hilar node measuring 18 mm short axis, series 2, image 66. no density appears similar to the adjacent pulmonary vasculature, but appears separate. Left supraclavicular adenopathy, largest node measuring 2 cm, series 2,  image 19. No axillary adenopathy. No thyroid nodule. Esophagus slightly patulous but no wall thickening. Lungs/Pleura: Patchy bilateral ground-glass opacities within both lungs, right greater than left. Small bilateral pleural effusions. Background lung opacity limits assessment for pulmonary nodule or mass, no dominant pulmonary mass visualized. Calcified granuloma in the right lower lobe. Mild elevation of right hemidiaphragm. Upper Abdomen: Assessed on recent abdominal CT. Retroperitoneal adenopathy. Hepatic and renal cysts and central splenic mass. Musculoskeletal: Multilevel degenerative change in the spine. There are no acute or suspicious osseous abnormalities. No evidence of lytic or blastic osseous lesion. IMPRESSION: 1. Right hilar and left supraclavicular adenopathy. Given abdominal adenopathy, findings suspicious for lymphoproliferative disorder. 2. Patchy bilateral ground-glass opacities within both lungs, right greater than left consistent with COVID pneumonia. 3. Small bilateral pleural effusions. 4. Aortic Atherosclerosis (ICD10-I70.0). Coronary artery calcifications. Electronically Signed   By: Keith Rake M.D.   On: 02/24/2019 23:25    Medications:   . vitamin C  500 mg Oral Daily  . aspirin EC  81 mg Oral Daily  .  atorvastatin  5 mg Oral Daily  . bisacodyl  10 mg Rectal Once  . brinzolamide  1 drop Both Eyes TID  . dexamethasone (DECADRON) injection  6 mg Intravenous Daily  . donepezil  10 mg Oral QHS  . enoxaparin (LOVENOX) injection  40 mg Subcutaneous QHS  . irbesartan  150 mg Oral Daily  . latanoprost  1 drop Both Eyes QHS  . polyethylene glycol  17 g Oral Daily  . senna-docusate  1 tablet Oral BID  . zinc sulfate  220 mg Oral Daily   Continuous Infusions: . remdesivir 100 mg in NS 100 mL 100 mg (02/25/19 0831)     LOS: 3 days   [place] [facility]   [facility] How to contact the [facility] Attending or Consulting provider [place] or covering  provider during after hours [place], for this patient?  1. Check the care team in [facility] and look for a) attending/consulting TR[facility] provider listed and b) the [facility] team listed 2. Log into [email or web address] and use [place]'s universal password to access. If you do not have the password, please contact the hospital operator. 3. Locate the [facility] provider you are looking for under [facility] and page to a number that you can be directly reached. 4. If you still have difficulty reaching the provider, please page the [facility] (Director on Call) for the Hospitalists listed on amion for assistance.  02/25/2019, 7:25 PM

## 2019-02-26 DIAGNOSIS — Z515 Encounter for palliative care: Secondary | ICD-10-CM

## 2019-02-26 DIAGNOSIS — Z7189 Other specified counseling: Secondary | ICD-10-CM

## 2019-02-26 LAB — CBC WITH DIFFERENTIAL/PLATELET
Abs Immature Granulocytes: 0.06 10*3/uL (ref 0.00–0.07)
Basophils Absolute: 0 10*3/uL (ref 0.0–0.1)
Basophils Relative: 0 %
Eosinophils Absolute: 0 10*3/uL (ref 0.0–0.5)
Eosinophils Relative: 0 %
HCT: 41.2 % (ref 39.0–52.0)
Hemoglobin: 13.7 g/dL (ref 13.0–17.0)
Immature Granulocytes: 0 %
Lymphocytes Relative: 8 %
Lymphs Abs: 1.2 10*3/uL (ref 0.7–4.0)
MCH: 30.8 pg (ref 26.0–34.0)
MCHC: 33.3 g/dL (ref 30.0–36.0)
MCV: 92.6 fL (ref 80.0–100.0)
Monocytes Absolute: 1.8 10*3/uL — ABNORMAL HIGH (ref 0.1–1.0)
Monocytes Relative: 13 %
Neutro Abs: 11.1 10*3/uL — ABNORMAL HIGH (ref 1.7–7.7)
Neutrophils Relative %: 79 %
Platelets: UNDETERMINED 10*3/uL (ref 150–400)
RBC: 4.45 MIL/uL (ref 4.22–5.81)
RDW: 13.7 % (ref 11.5–15.5)
WBC: 14.2 10*3/uL — ABNORMAL HIGH (ref 4.0–10.5)
nRBC: 0 % (ref 0.0–0.2)

## 2019-02-26 LAB — PANEL 083904
HIV 1 AB: NEGATIVE
HIV 2 AB: NEGATIVE
Note: NEGATIVE

## 2019-02-26 LAB — T.PALLIDUM AB, TOTAL: T Pallidum Abs: REACTIVE — AB

## 2019-02-26 LAB — C-REACTIVE PROTEIN: CRP: 1.1 mg/dL — ABNORMAL HIGH (ref <1.0)

## 2019-02-26 LAB — CD4/CD8 (T-HELPER/T-SUPPRESSOR CELL)
CD4 absolute: 459 /uL (ref 400–1790)
CD4%: 49 % (ref 33–65)
CD8 T Cell Abs: 67 /uL — ABNORMAL LOW (ref 190–1000)
CD8tox: 7 % — ABNORMAL LOW (ref 12–40)
Ratio: 6.91 — ABNORMAL HIGH (ref 1.0–3.0)
Total lymphocyte count: 931 /uL — ABNORMAL LOW (ref 1000–4000)

## 2019-02-26 LAB — COMPREHENSIVE METABOLIC PANEL
ALT: 39 U/L (ref 0–44)
AST: 74 U/L — ABNORMAL HIGH (ref 15–41)
Albumin: 2.9 g/dL — ABNORMAL LOW (ref 3.5–5.0)
Alkaline Phosphatase: 63 U/L (ref 38–126)
Anion gap: 10 (ref 5–15)
BUN: 24 mg/dL — ABNORMAL HIGH (ref 8–23)
CO2: 21 mmol/L — ABNORMAL LOW (ref 22–32)
Calcium: 8.7 mg/dL — ABNORMAL LOW (ref 8.9–10.3)
Chloride: 106 mmol/L (ref 98–111)
Creatinine, Ser: 0.66 mg/dL (ref 0.61–1.24)
GFR calc Af Amer: >60 mL/min (ref >60)
GFR calc non Af Amer: >60 mL/min (ref >60)
Glucose, Bld: 98 mg/dL (ref 70–99)
Potassium: 3.6 mmol/L (ref 3.5–5.1)
Sodium: 137 mmol/L (ref 135–145)
Total Bilirubin: 0.6 mg/dL (ref 0.3–1.2)
Total Protein: 6.2 g/dL — ABNORMAL LOW (ref 6.5–8.1)

## 2019-02-26 LAB — MAGNESIUM: Magnesium: 2.2 mg/dL (ref 1.7–2.4)

## 2019-02-26 LAB — PROTIME-INR
INR: 1.2 (ref 0.8–1.2)
Prothrombin Time: 15.1 seconds (ref 11.4–15.2)

## 2019-02-26 LAB — D-DIMER, QUANTITATIVE: D-Dimer, Quant: 0.79 ug/mL-FEU — ABNORMAL HIGH (ref 0.00–0.50)

## 2019-02-26 MED ORDER — PREDNISONE 20 MG PO TABS
30.0000 mg | ORAL_TABLET | Freq: Every day | ORAL | Status: DC
  Administered 2019-02-27: 30 mg via ORAL
  Filled 2019-02-26: qty 1

## 2019-02-26 MED ORDER — ENSURE ENLIVE PO LIQD
237.0000 mL | Freq: Two times a day (BID) | ORAL | Status: DC
  Administered 2019-03-02 – 2019-03-03 (×3): 237 mL via ORAL

## 2019-02-26 MED ORDER — QUETIAPINE FUMARATE 25 MG PO TABS
25.0000 mg | ORAL_TABLET | Freq: Every day | ORAL | Status: DC
  Administered 2019-02-26 – 2019-03-15 (×18): 25 mg via ORAL
  Filled 2019-02-26 (×19): qty 1

## 2019-02-26 MED ORDER — ADULT MULTIVITAMIN W/MINERALS CH
1.0000 | ORAL_TABLET | Freq: Every day | ORAL | Status: DC
  Administered 2019-02-26 – 2019-03-16 (×17): 1 via ORAL
  Filled 2019-02-26 (×16): qty 1

## 2019-02-26 MED ORDER — ENOXAPARIN SODIUM 40 MG/0.4ML ~~LOC~~ SOLN
40.0000 mg | Freq: Every day | SUBCUTANEOUS | Status: DC
  Administered 2019-02-27: 21:00:00 40 mg via SUBCUTANEOUS
  Filled 2019-02-26: qty 0.4

## 2019-02-26 MED ORDER — LACTATED RINGERS IV SOLN: INTRAVENOUS | Status: AC

## 2019-02-26 NOTE — Progress Notes (Signed)
Progress Note    [name]  [id number] DOB: 01-16-1929  DOA: 02/22/2019 PCP: [name], MD    Brief Narrative:   Chief complaint: Follow-up generalized weakness associated with retroperitoneal lymphadenopathy, splenic mass and pneumonia.  Medical records reviewed and are as summarized below:  [name] is an 86 y.o. male with a PMH of advanced dementia, hypertension, hyperlipidemia, GERD and BPH who was admitted 02/22/2019 with a chief complaint of generalized weakness progressive over several weeks.  In the ED, he had low-grade temperature but was noted to be hypoxic with a room air saturation of 84% associated with tachycardia.  Chest x-ray was clear and CT was negative for acute infarct.  CT of the abdomen and pelvis showed a splenic mass and retroperitoneal adenopathy along with patchy bilateral airspace disease consistent with viral pneumonia.  COVID-19 testing was positive as was RPR and HIV testing (awaiting confirmatory tests).  Assessment/Plan:   Principal Problem:   Pneumonia due to COVID-19 virus associated with weakness/lab test positive for detection of [id number] virus complicated by delirium and restlessness CT scan personally reviewed and groundglass opacities noted bilateral lung bases.  Maintaining oxygen saturations.  Radiographs reviewed and findings more consistent with a viral pneumonia or inflammatory process so antibiotics discontinued 02/23/2019.  WBC not elevated.  Started on remdesivir and Decadron 02/23/2019 after inflammatory markers checked and found to be elevated. Inflammatory markers continues to trend down. In the setting of significant agitation I will reduce the dose of the steroids from 6 mg Decadron to 20 mg prednisone tomorrow.  Screening HIV and syphilis tests positive Patient underwent syphilis and HIV screening on admission.  CD4 count and viral load pending.  Treponema pallidum antibody titer pending.  HIV confirmatory test  pending.  discussed with daughter.  Informed that Treponema pallidum is still pending.  HIV1  is negative.  Extensive retroperitoneal, para-aortic, as well as mediastinal lymphadenopathy. Goals of care discussion. Presenting with poor p.o. intake. A CT abdomen on the time of the admission shows evidence of abdominal lymphadenopathy as well as splenic node. CT chest also shows supraclavicular as well as hilar lymphadenopathy. IR consulted for biopsy. Dr. Elwyn Reach from oncology was initially called At present as the patient is already on steroids I am not sure whether we will be able to get good results on FNAC. Patient may require full lymph node biopsy to get accurate diagnosis. Also discussed with patient's daughter that the patient may not be a great candidate for chemotherapy and may not tolerate the regimen. Patient's wife who is the POA has informed patient's daughter that do everything that you can conceivably do for the patient at present. Patient's wife is also in the hospital currently with Covid pneumonia. Palliative care will be consulted for further assistance. Discussed with palliative care again who will discuss the patient's case with patient's son. Currently I do not believe the patient is a candidate for resuscitation-patient's likelihood of surviving such event is significantly less and quality of life after surviving such event is significantly poor. Continue to engage with the family regarding goals of care.  Cerebral atrophy (HCC)/advanced dementia with behavioral disturbance Acute delirium in the setting of COVID-19 pneumonia, change in the home environment as well as hypoxia. Continue Aricept.  Intermittently agitated as well as delirious. We will continue with gentle hydration and monitor. Also patient is receiving frequent Haldol.  We will add Seroquel on a scheduled basis nightly and monitor. We are also reducing the dose of  the steroids to see if that is going to help  delirium.    Dyslipidemia Continue Lipitor.    Glaucoma Continue Azopt and Xalatan.    Essential hypertension Continue Avapro.    Benign prostatic hyperplasia with urinary obstruction No evidence of urinary retention.    Hearing loss Supportive care.    Hypokalemia Corrected with adding potassium to IV fluids.    Paranasal sinus disease Noted incidentally on CT scanning.    Protein calorie malnutrition Dietitian consultation requested to assess nutritional needs. Nutrition Problem: Increased nutrient needs Etiology: acute illness(COVID-19) Signs/Symptoms: estimated needs Interventions: Ensure Enlive (each supplement provides 350kcal and 20 grams of protein), Magic cup, MVI  Body mass index is 25.44 kg/m.   Family Communication/Anticipated D/C date and plan/Code Status   DVT prophylaxis: Lovenox ordered. Code Status: Full Code.  Discussed with family, and at this time they want full CODE STATUS. Family Communication: Conference call with family daughter and son on 02/26/2019. Patient's son will be the main contact person going forward.  Disposition Plan: From home.  PT recommending SNF placement.  Family currently agreeable for SNF.  Medical Consultants:    Palliative Care   Anti-Infectives:    Rocephin/azithromycin 02/22/2019---> 02/23/2019  Subjective:   Significantly lethargic.  Unable to follow any commands.  More confused..  Had difficulty swallowing liquids this morning as well.  Objective:    Vitals:   02/25/19 1340 02/25/19 2052 02/26/19 0448 02/26/19 0956  BP: 114/73 (!) 107/95 (!) 143/68 120/65  Pulse: 77 99 89 76  Resp: 18 18 17 19   Temp: 98.7 F (37.1 C) 98 F (36.7 C) 98.5 F (36.9 C) 97.8 F (36.6 C)  TempSrc: Oral Oral Oral Oral  SpO2: 97% 98% 100% 97%  Weight:      Height:        Intake/Output Summary (Last 24 hours) at 02/26/2019 1754 Last data filed at 02/26/2019 1000 Gross per 24 hour  Intake 91.67 ml  Output --  Net 91.67 ml    Filed Weights   02/22/19 2056  Weight: 71.5 kg    Exam: General: No acute distress.  Cardiovascular: Heart sounds show a regular rate, and rhythm. No gallops or rubs. No murmurs. No JVD. Lungs: Clear to auscultation bilaterally with good air movement. No rales, rhonchi or wheezes. Abdomen: Soft, nontender, nondistended with normal active bowel sounds. No masses. No hepatosplenomegaly. Neurological: Alert. Non-focal. Skin: Warm and dry. No rashes or lesions. Extremities: No clubbing or cyanosis. No edema. Pedal pulses 2+. Psychiatric: Mood and affect are normal. Insight and judgment are impaired.       Data Reviewed:   I have personally reviewed following labs and imaging studies:  Labs: Labs show the following:   Basic Metabolic Panel: Recent Labs  Lab 02/22/19 1123 02/23/19 0322 02/24/19 0221 02/26/19 0340  NA 135 136 140 137  K 3.5 3.1* 3.9 3.6  CL 97* 101 105 106  CO2 27 25 23  21*  GLUCOSE 103* 107* 125* 98  BUN 20 14 12  24*  CREATININE 0.71 0.58* 0.47* 0.66  CALCIUM 9.1 8.5* 8.7* 8.7*  MG  --   --   --  2.2   GFR Estimated Creatinine Clearance: 57.6 mL/min (by C-G formula based on SCr of 0.66 mg/dL). Liver Function Tests: Recent Labs  Lab 02/22/19 1226 02/26/19 0340  AST 43* 74*  ALT 25 39  ALKPHOS 70 63  BILITOT 0.7 0.6  PROT 6.8 6.2*  ALBUMIN 3.2* 2.9*   Recent Labs  Lab  02/22/19 1226  LIPASE 33   CBC: Recent Labs  Lab 02/22/19 1123 02/23/19 0322 02/24/19 0221 02/26/19 0340  WBC 5.5 6.1 4.5 14.2*  NEUTROABS  --   --   --  11.1*  HGB 14.6 13.4 13.5 13.7  HCT 44.0 39.8 41.6 41.2  MCV 93.8 93.6 94.5 92.6  PLT 182 170 197 PLATELET CLUMPS NOTED ON SMEAR, UNABLE TO ESTIMATE   D-Dimer: Recent Labs    02/26/19 0340  DDIMER 0.79*    Thyroid function studies: No results for input(s): TSH, T4TOTAL, T3FREE, THYROIDAB in the last 72 hours.  Invalid input(s): FREET3 Anemia work up: No results for input(s): VITAMINB12, FOLATE,  FERRITIN, TIBC, IRON, RETICCTPCT in the last 72 hours. \ Microbiology Recent Results (from the past 240 hour(s))  Urine culture     Status: Abnormal   Collection Time: 02/22/19 12:26 PM   Specimen: Urine, Clean Catch  Result Value Ref Range Status   Specimen Description   Final    URINE, CLEAN CATCH Performed at [facility], [place] [address], [place], [place] [address]    Special Requests   Final    NONE Performed at [facility], [place] [address], [place], [place] [address]    Culture MULTIPLE SPECIES PRESENT, SUGGEST RECOLLECTION (A)  Final   Report Status 02/23/2019 FINAL  Final  SARS CORONAVIRUS 2 (TAT 6-24 HRS) Nasopharyngeal Nasopharyngeal Swab     Status: Abnormal   Collection Time: 02/22/19  5:06 PM   Specimen: Nasopharyngeal Swab  Result Value Ref Range Status   SARS Coronavirus 2 POSITIVE (A) NEGATIVE Final    Comment: RESULT CALLED TO, READ BACK BY AND VERIFIED WITH: B. COLES,RN [id number] 02/22/2019 T. TYSOR (NOTE) SARS-CoV-2 target nucleic acids are DETECTED. The SARS-CoV-2 RNA is generally detectable in upper and lower respiratory specimens during the acute phase of infection. Positive results are indicative of the presence of SARS-CoV-2 RNA. Clinical correlation with patient history and other diagnostic information is  necessary to determine patient infection status. Positive results do not rule out bacterial infection or co-infection with other viruses.  The expected result is Negative. Fact Sheet for Patients: SugarRoll.be Fact Sheet for Healthcare Providers: https://www.woods-mathews.com/ This test is not yet approved or cleared by the [place] FDA and  has been authorized for detection and/or diagnosis of SARS-CoV-2 by FDA under an Emergency Use Authorization (EUA). This EUA will remain  in effect (meaning this test can be used) for th e duration of the COVID-19 declaration under  Section 564(b)(1) of the Act, 21 U.S.C. section 360bbb-3(b)(1), unless the authorization is terminated or revoked sooner. Performed at [facility], [place] [address] [place], [place] [address]     Procedures and diagnostic studies:  CT CHEST W CONTRAST  Result Date: 02/24/2019 CLINICAL DATA:  Provided history of colon cancer of unknown primary, surveillance Technologist notes state pneumonia due to COVID-19. EXAM: CT CHEST WITH CONTRAST TECHNIQUE: Multidetector CT imaging of the chest was performed during intravenous contrast administration. CONTRAST:  52mL OMNIPAQUE IOHEXOL 300 MG/ML  SOLN COMPARISON:  Chest radiograph 02/22/2019. Abdominal CT 02/12/2019. No remote prior chest CT. FINDINGS: Cardiovascular: Aortic atherosclerosis. No aneurysm or dissection. Coronary artery calcifications. Normal heart size. No pericardial effusion. Mediastinum/Nodes: Probable enlarged right hilar node measuring 18 mm short axis, series 2, image 66. no density appears similar to the adjacent pulmonary vasculature, but appears separate. Left supraclavicular adenopathy, largest node measuring 2 cm, series 2, image 19. No axillary adenopathy. No thyroid nodule.  Esophagus slightly patulous but no wall thickening. Lungs/Pleura: Patchy bilateral ground-glass opacities within both lungs, right greater than left. Small bilateral pleural effusions. Background lung opacity limits assessment for pulmonary nodule or mass, no dominant pulmonary mass visualized. Calcified granuloma in the right lower lobe. Mild elevation of right hemidiaphragm. Upper Abdomen: Assessed on recent abdominal CT. Retroperitoneal adenopathy. Hepatic and renal cysts and central splenic mass. Musculoskeletal: Multilevel degenerative change in the spine. There are no acute or suspicious osseous abnormalities. No evidence of lytic or blastic osseous lesion. IMPRESSION: 1. Right hilar and left supraclavicular adenopathy. Given abdominal adenopathy, findings  suspicious for lymphoproliferative disorder. 2. Patchy bilateral ground-glass opacities within both lungs, right greater than left consistent with COVID pneumonia. 3. Small bilateral pleural effusions. 4. Aortic Atherosclerosis (ICD10-I70.0). Coronary artery calcifications. Electronically Signed   By: Keith Rake M.D.   On: 02/24/2019 23:25    Medications:   . vitamin C  500 mg Oral Daily  . aspirin EC  81 mg Oral Daily  . atorvastatin  5 mg Oral Daily  . brinzolamide  1 drop Both Eyes TID  . dexamethasone (DECADRON) injection  6 mg Intravenous Daily  . donepezil  10 mg Oral QHS  . [START ON 02/27/2019] enoxaparin (LOVENOX) injection  40 mg Subcutaneous QHS  . feeding supplement (ENSURE ENLIVE)  237 mL Oral BID BM  . irbesartan  150 mg Oral Daily  . latanoprost  1 drop Both Eyes QHS  . multivitamin with minerals  1 tablet Oral Daily  . polyethylene glycol  17 g Oral Daily  . QUEtiapine  25 mg Oral QHS  . senna-docusate  1 tablet Oral BID  . zinc sulfate  220 mg Oral Daily   Continuous Infusions: . lactated ringers    . remdesivir 100 mg in NS 100 mL 100 mg (02/26/19 0940)     LOS: 4 days   [facility] [facility]   [facility] How to contact the [facility] Attending or Consulting provider [place] or covering provider during after hours [place], for this patient?  1. Check the care team in [facility] and look for a) attending/consulting [facility] provider listed and b) the [facility] team listed 2. Log into [email or web address] and use [place] universal password to access. If you do not have the password, please contact the hospital operator. 3. Locate the [facility] provider you are looking for under [facility] and page to a number that you can be directly reached. 4. If you still have difficulty reaching the provider, please page the [facility] (Director on Call) for the Hospitalists listed on amion for assistance.  02/26/2019, 5:54 PM

## 2019-02-26 NOTE — Consult Note (Signed)
Chief Complaint: Patient was seen in consultation today for lymph node biopsy.   Referring Physician(s): Dr. Akula/Dr. Berle Mull  Supervising Physician: Jacqulynn Cadet  Patient Status: [facility] - In-pt  History of Present Illness: [name] is a 86 y.o. male with a past medical history significant for glaucoma, HOH w/ bilateral hearing aids, advanced dementia, BPH, GERD, HLD and HTN who presented to [facility] ED on 02/21/19 due to worsening weakness for several weeks. Per patient's son, [name], patient had been experiencing decreased appetite, worsening weakness, difficulty walking and frequent falls which had previously been occurring intermittently however recently these symptoms began to occur daily. He is followed by home health RN who was concerned for dehydration when they evaluated the patient earlier that day and recommended he be seen in the ED. Initial labs in the ED were unremarkable however a CT abd/pelvis w/contrast showed new abdominal retroperitoneal lymphadenopathy and splenic mass likely due to lymphoproliferative disorder. He was also found to be Covid (+), RPR and HIV (+), although confirmatory tests are pending. He was admitted for further evaluation and management. Oncology was consulted and recommended biopsy prior to formal consult from their service. IR has been consulted for an image guide lymph node biopsy to further direct care.  [name] is awake upon entry to his room, he does not answer any of my questions today and does not follow commands. He is tugging at his clothing, the bedding and his genitalia consistently during my exam. I spoke with his son and daughter via conference call today to discuss the biopsy, they are both in agreement with proceeding as long as he can tolerate the procedure. They are asking if he understands what is going on and told me that he may be unable to follow commands currently because his hearing aid batteries may be dead. They ask that I  go and see their mother who is also unfortunately admitted with Covid on the same floor, they both state that she is the person who makes all of the final decisions regarding his care and ask that she sign his consent form. They are both adamant that should this turn out to be cancer their father would want to undergo any and all treatment to extend his life, however they both understand that given his age and co-morbidities treatment options may be limited. They understand that oncology can discuss this further with them after the biopsy.  I met with [name] in her room today, she tells me she is feeling much better and would like to go home but her children are concerned about her coming home without her husband. She states that they have been married for over 60 years and it's extremely hard to not be able to see him right now. She appreciates all of the care that both she and her husband have received and is hopeful that she will be able to visit him sometime time soon. She states understanding of the requested procedure and gives consent to proceed.   Past Medical History:  Diagnosis Date   Allergic rhinitis    BPH (benign prostatic hypertrophy) with urinary obstruction 2000   40gm, L lobe 31m prostate nodule; followed yearly by Dr DDiona Fanti  GERD (gastroesophageal reflux disease)    Glaucoma    Herpes zoster ophthalmicus    history   History of Reeves stones remote   x 1-Ca monhydrate, hyperoxalate urine s/p lithotripsy   HLD (hyperlipidemia)    HTN (hypertension)  Impaired glucose tolerance    Impaired hearing    uses hearing aides bilaterally   Osteopenia     Past Surgical History:  Procedure Laterality Date   APPENDECTOMY  1997   CATARACT EXTRACTION Right 11/1013   Dr. Venetia Maxon   COLONOSCOPY  2002   WNL-Kaplan; repeat 2012   COLONOSCOPY  02/2012   tubular adenoma Deatra Ina)   [place]   left-1955; [phone]   LITHOTRIPSY  2005    SBO  2008, 2010    Allergies: Patient has no known allergies.  Medications: Prior to Admission medications   Medication Sig Start Date End Date Taking? Authorizing Provider  acetaminophen (TYLENOL) 500 MG tablet Take 2 tablets (1,000 mg total) by mouth at bedtime. 02/05/19  Yes [name], MD  aspirin EC 81 MG tablet Take 81 mg by mouth daily.   Yes [provider]  atorvastatin (LIPITOR) 10 MG tablet Take 0.5 tablets (5 mg total) by mouth daily. 04/20/18  Yes [name], MD  AZOPT 1 % ophthalmic suspension Place 1 drop into both eyes 3 (three) times daily. 02/20/19  Yes [provider]  donepezil (ARICEPT) 10 MG tablet Take 1 tablet (10 mg total) by mouth at bedtime. 04/20/18  Yes [name], MD  polyethylene glycol powder [facility]) 17 GM/SCOOP powder Take 8.5-17 g by mouth daily. Hold for diarrhea Patient taking differently: Take 8.5-17 g by mouth daily as needed for mild constipation or moderate constipation. Hold for diarrhea 02/08/19  Yes [name], MD  valsartan (DIOVAN) 160 MG tablet TAKE 1 TABLET BY MOUTH DAILY 06/29/18  Yes [name], MD  VYZULTA 0.024 % SOLN Place 1 drop into both eyes at bedtime. 02/20/19  Yes [provider]  losartan (COZAAR) 100 MG tablet TAKE 1 TABLET BY MOUTH DAILY 05/02/17   [name], MD     Family History  Problem Relation Age of Onset   Coronary artery disease Father    Healthy Mother    Diabetes Brother    Breast cancer Sister    Leukemia Maternal Uncle    Pancreatic cancer Sister     Social History   Socioeconomic History   Marital status: Married    Spouse name: Not on file   Number of children: 2   Years of education: Not on file   Highest education level: Not on file  Occupational History   Occupation: Marine scientist: retired  Tobacco Use   Smoking status: Former Smoker    Types: Cigarettes   Smokeless tobacco: Never Used    Substance and Sexual Activity   Alcohol use: No   Drug use: No   Sexual activity: Never  Other Topics Concern   Not on file  Social History Narrative   Minimal caffeine   Lives with wife, [name]. No pets   Nutritional therapist; working for Surveyor, quantity currently; retired from [employer]   Edu: 12th grade education   Activity: stays active outside   Diet: good water, fruits/vegetables daily   Social Determinants of Radio broadcast assistant Strain:    Difficulty of Paying Living Expenses: Not on file  Food Insecurity:    Worried About Charity fundraiser in the Last Year: Not on file   [employer] of Food in the Last Year: Not on file  Transportation Needs:    Lack of Transportation (Medical): Not on file   Lack of Transportation (Non-Medical): Not on file  Physical Activity:  Days of Exercise per Week: Not on file   Minutes of Exercise per Session: Not on file  Stress:    Feeling of Stress : Not on file  Social Connections:    Frequency of Communication with Friends and Family: Not on file   Frequency of Social Gatherings with Friends and Family: Not on file   Attends Religious Services: Not on file   Active Member of Clubs or Organizations: Not on file   Attends Archivist Meetings: Not on file   Marital Status: Not on file     Review of Systems: A 12 point ROS discussed and pertinent positives are indicated in the HPI above.  All other systems are negative.  Review of Systems  Unable to perform ROS: Mental status change    Vital Signs: BP 120/65 (BP Location: Right Arm)    Pulse 76    Temp 97.8 F (36.6 C) (Oral)    Resp 19    Ht _0  (1.676 m)    Wt 157 lb 10.1 oz (71.5 kg)    SpO2 97%    BMI 25.44 kg/m   Physical Exam Vitals and nursing note reviewed.  Constitutional:      General: He is not in acute distress.    Appearance: He is ill-appearing.     Comments: Agitated, pulling at clothing and genitalia. Mittens in place. Does not follow commands  but does track examiner.   HENT:     Head: Normocephalic.  Cardiovascular:     Rate and Rhythm: Normal rate and regular rhythm.  Pulmonary:     Effort: Pulmonary effort is normal.     Breath sounds: Normal breath sounds.     Comments: Anterior exam only Abdominal:     General: There is no distension.     Palpations: Abdomen is soft.  Lymphadenopathy:     Cervical: No cervical adenopathy (non palpable).  Skin:    General: Skin is warm and dry.  Neurological:     Mental Status: He is alert. He is disoriented.      MD Evaluation Airway: WNL Heart: WNL Abdomen: WNL Chest/ Lungs: WNL ASA  Classification: 3 Mallampati/Airway Score: Two(Difficult to assess - patient does not follow commands)   Imaging: DG Lumbar Spine Complete  Result Date: 02/06/2019 CLINICAL DATA:  Lower back pain. EXAM: LUMBAR SPINE - COMPLETE 4+ VIEW COMPARISON:  CT 03/21/2007. FINDINGS: Lumbar spine scoliosis concave left. Diffuse multilevel degenerative change. No acute bony abnormality identified. Aortoiliac and visceral atherosclerotic vascular calcification again noted. IMPRESSION: 1. Lumbar spine scoliosis concave left. Diffuse multilevel degenerative change. No acute abnormality identified. 2. Large amount of stool noted throughout the colon suggesting constipation. 3.  Aortoiliac and visceral atherosclerotic vascular disease. Electronically Signed   By: Marcello Moores  Register   On: 02/06/2019 07:06   CT Head Wo Contrast  Result Date: 02/22/2019 CLINICAL DATA:  Altered mental status with fatigue.  Dehydration. EXAM: CT HEAD WITHOUT CONTRAST TECHNIQUE: Contiguous axial images were obtained from the base of the skull through the vertex without intravenous contrast. COMPARISON:  None. FINDINGS: Brain: There is moderate diffuse atrophy. There is no intracranial mass, hemorrhage, extra-axial fluid collection, or midline shift. Brain parenchyma appears unremarkable. No evident acute infarct. Vascular: There is no  hyperdense vessel. There are foci of calcification in each carotid siphon region. Skull: Bony calvarium appears intact. Sinuses/Orbits: There is a retention cyst in the inferior posterior right maxillary antrum. There is mucosal thickening and opacification involving multiple ethmoid air cells.  Orbits appear symmetric bilaterally except for evidence of previous cataract surgery on the right. Other: Mastoid air cells are clear. IMPRESSION: 1. Atrophy. Brain parenchyma appears unremarkable. No acute infarct. No mass or hemorrhage. 2.  Foci of arterial vascular calcification noted. 3.  Foci of paranasal sinus disease evident. Electronically Signed   By: Lowella Grip III M.D.   On: 02/22/2019 14:57   CT CHEST W CONTRAST  Result Date: 02/24/2019 CLINICAL DATA:  Provided history of colon cancer of unknown primary, surveillance Technologist notes state pneumonia due to COVID-19. EXAM: CT CHEST WITH CONTRAST TECHNIQUE: Multidetector CT imaging of the chest was performed during intravenous contrast administration. CONTRAST:  26m OMNIPAQUE IOHEXOL 300 MG/ML  SOLN COMPARISON:  Chest radiograph 02/22/2019. Abdominal CT 02/12/2019. No remote prior chest CT. FINDINGS: Cardiovascular: Aortic atherosclerosis. No aneurysm or dissection. Coronary artery calcifications. Normal heart size. No pericardial effusion. Mediastinum/Nodes: Probable enlarged right hilar node measuring 18 mm short axis, series 2, image 66. no density appears similar to the adjacent pulmonary vasculature, but appears separate. Left supraclavicular adenopathy, largest node measuring 2 cm, series 2, image 19. No axillary adenopathy. No thyroid nodule. Esophagus slightly patulous but no wall thickening. Lungs/Pleura: Patchy bilateral ground-glass opacities within both lungs, right greater than left. Small bilateral pleural effusions. Background lung opacity limits assessment for pulmonary nodule or mass, no dominant pulmonary mass visualized. Calcified  granuloma in the right lower lobe. Mild elevation of right hemidiaphragm. Upper Abdomen: Assessed on recent abdominal CT. Retroperitoneal adenopathy. Hepatic and renal cysts and central splenic mass. Musculoskeletal: Multilevel degenerative change in the spine. There are no acute or suspicious osseous abnormalities. No evidence of lytic or blastic osseous lesion. IMPRESSION: 1. Right hilar and left supraclavicular adenopathy. Given abdominal adenopathy, findings suspicious for lymphoproliferative disorder. 2. Patchy bilateral ground-glass opacities within both lungs, right greater than left consistent with COVID pneumonia. 3. Small bilateral pleural effusions. 4. Aortic Atherosclerosis (ICD10-I70.0). Coronary artery calcifications. Electronically Signed   By: MKeith RakeM.D.   On: 02/24/2019 23:25   CT ABDOMEN PELVIS W CONTRAST  Result Date: 02/22/2019 CLINICAL DATA:  Abdominal distension. Anorexia. EXAM: CT ABDOMEN AND PELVIS WITH CONTRAST TECHNIQUE: Multidetector CT imaging of the abdomen and pelvis was performed using the standard protocol following bolus administration of intravenous contrast. CONTRAST:  1060mOMNIPAQUE IOHEXOL 300 MG/ML  SOLN COMPARISON:  03/21/2007 FINDINGS: Lower Chest: Patchy areas of airspace disease are seen bilaterally mainly in the lower lobes, suspicious for viral pneumonia or inflammatory process. No evidence of pleural effusion. Hepatobiliary: No hepatic masses identified. Multiple small hepatic cysts are stable. Gallbladder is unremarkable. No evidence of biliary ductal dilatation. Pancreas:  No mass or inflammatory changes. Spleen: No evidence of splenomegaly. A new low-attenuation soft tissue mass is seen in the central spleen which measures 3.8 x 3.4 cm. Adrenals/Urinary Tract: Several large renal cysts are again seen bilaterally. No masses identified. No evidence of ureteral calculi or hydronephrosis. Unremarkable unopacified urinary bladder. Stomach/Bowel: No evidence  of obstruction, inflammatory process or abnormal fluid collections. Vascular/Lymphatic: New abdominal retroperitoneal lymphadenopathy is seen in the region of the celiac axis and throughout the left paraaortic and aortocaval spaces. Largest conglomeration of lymph nodes in the left paraaortic region measures 4.9 x 3.1 cm on image 43/2. No pathologically enlarged lymph nodes seen within the pelvis. Aortic atherosclerosis incidentally noted. No abdominal aortic aneurysm. Reproductive:  No mass or other significant abnormality identified. Other:  None. Musculoskeletal:  No suspicious bone lesions identified. IMPRESSION: 1. New abdominal retroperitoneal lymphadenopathy and splenic  mass. This is likely due to lymphoproliferative disorder, with metastatic disease considered less likely. 2. Patchy bilateral airspace disease mainly in the lower lobes, suspicious for viral pneumonia or inflammatory process. Pulmonary involvement by lymphoma is considered less likely but cannot be excluded. Recommend clinical correlation and COVID-19 virus testing. Electronically Signed   By: Marlaine Hind M.D.   On: 02/22/2019 15:05   DG Chest Portable 1 View  Result Date: 02/22/2019 CLINICAL DATA:  Increasing shortness of breath over the past 7 days EXAM: PORTABLE CHEST 1 VIEW COMPARISON:  10/17/2003 FINDINGS: Cardiac shadow is accentuated by the portable technique. Aortic calcifications are noted. Elevation the right hemidiaphragm is noted new from the prior exam. No focal infiltrate or sizable effusion is seen. No bony abnormality is noted. IMPRESSION: No active disease. Electronically Signed   By: Inez Catalina M.D.   On: 02/22/2019 14:03    Labs:  CBC: Recent Labs    02/22/19 1123 02/23/19 0322 02/24/19 0221 02/26/19 0340  WBC 5.5 6.1 4.5 14.2*  HGB 14.6 13.4 13.5 13.7  HCT 44.0 39.8 41.6 41.2  PLT 182 170 197 PLATELET CLUMPS NOTED ON SMEAR, UNABLE TO ESTIMATE    COAGS: Recent Labs    02/26/19 0340  INR 1.2     BMP: Recent Labs    02/22/19 1123 02/23/19 0322 02/24/19 0221 02/26/19 0340  NA 135 136 140 137  K 3.5 3.1* 3.9 3.6  CL 97* 101 105 106  CO2 _0 21*  GLUCOSE 103* 107* 125* 98  BUN _1 24*  CALCIUM 9.1 8.5* 8.7* 8.7*  CREATININE 0.71 0.58* 0.47* 0.66  GFRNONAA >60 >60 >60 >60  GFRAA >60 >60 >60 >60    LIVER FUNCTION TESTS: Recent Labs    04/17/18 1207 02/22/19 1226 02/26/19 0340  BILITOT 0.6 0.7 0.6  AST 23 43* 74*  ALT 23 25 39  ALKPHOS 54 70 63  PROT 7.3 6.8 6.2*  ALBUMIN 4.4 3.2* 2.9*    TUMOR MARKERS: No results for input(s): AFPTM, CEA, CA199, CHROMGRNA in the last 8760 hours.  Assessment and Plan:  86 y/o M with history of advanced dementia who presented to the ED on 12/30 with c/o worsening weakness and oral intake per family. He was found to be Covid (+), RPR (+) and HIV (+) -- confirmatory tests are pending. Additionally, CT abd/pelvis w/contrast showed new abdominal retroperitoneal lymphadenopathy and splenic mass likely due to lymphoproliferative disorder. IR has been consulted for image guided biopsy - patient history and imaging reviewed by Dr. Laurence Ferrari today who has approved patient for a left supraclavicular lymph node biopsy as long as he is able to tolerate the procedure. His agitation will need to be re-assessed tomorrow prior to proceeding to ensure that it is safe for the patient to undergo the biopsy.   Discussed procedure in detail with patient's children via phone and his wife at her bedside today - all are in agreement to proceed with biopsy. Palliative care consult to discuss goals of care pending currently, although they all have stated to me today that they would be open to any and all treatments available that would prolong [name] life.   Patient to be NPO at midnight tonight, Lovenox SQ to be held tonight (may resume evening of 1/5 as scheduled), AM labs pending, IR will call for patient when ready.   Risks and benefits  of supraclavicular lymph node biopsy was discussed with the patient and/or patient's family including, but not limited  to bleeding, infection, damage to adjacent structures or low yield requiring additional tests.  All of the questions were answered and there is agreement to proceed.  Consent signed by patient's wife [name] and is in chart.  Thank you for this interesting consult.  I greatly enjoyed meeting [name] and look forward to participating in their care.  A copy of this report was sent to the requesting provider on this date.  Electronically Signed: Joaquim Nam, PA-C 02/26/2019, 3:01 PM   I spent a total of 40 Minutes  in face to face in clinical consultation, greater than 50% of which was counseling/coordinating care for supraclavicular lymph node biopsy.

## 2019-02-26 NOTE — Progress Notes (Signed)
Physical Therapy Treatment Patient Details Name: [name] MRN: [id number] DOB: 05-Jun-1928 Today's Date: 02/26/2019    History of Present Illness Pt admitted from home Covid + and with hx of dementia    PT Comments    Pt was able to participate more with therapy on today. Caught pt at a time when he was more alert. He was able to walk a short distance with +2 assistance and use of a RW. Once back in bed, pt immediately went to sleep. Will continue to follow and progress activity as tolerated.     Follow Up Recommendations  SNF     Equipment Recommendations  None recommended by PT    Recommendations for Other Services       Precautions / Restrictions Precautions Precautions: Fall Restrictions Weight Bearing Restrictions: No    Mobility  Bed Mobility Overal bed mobility: Needs Assistance Bed Mobility: Supine to Sit;Sit to Supine     Supine to sit: Max assist;+2 for physical assistance;+2 for safety/equipment;HOB elevated Sit to supine: Mod assist;+2 for safety/equipment;+2 for physical assistance;HOB elevated   General bed mobility comments: Assist for trunk and bil LEs. Utilized bedpad to aid with scooting, postioning.  Transfers Overall transfer level: Needs assistance Equipment used: Rolling walker (2 wheeled) Transfers: Sit to/from Stand Sit to Stand: Mod assist;+2 physical assistance;+2 safety/equipment         General transfer comment: Assist to rise, stabilize, control descent. Multimodal cueing required.  Ambulation/Gait Ambulation/Gait assistance: Mod assist;+2 physical assistance;+2 safety/equipment Gait Distance (Feet): 15 Feet Assistive device: Rolling walker (2 wheeled) Gait Pattern/deviations: Step-through pattern;Decreased stride length;Trunk flexed     General Gait Details: Assist to stabilize pt and maneuver with RW. Multimodal cueing and guidance required.   Stairs             Wheelchair Mobility    Modified Rankin (Stroke  Patients Only)       Balance Overall balance assessment: Needs assistance         Standing balance support: Bilateral upper extremity supported Standing balance-Leahy Scale: Poor                              Cognition Arousal/Alertness: Awake/alert Behavior During Therapy: WFL for tasks assessed/performed Overall Cognitive Status: History of cognitive impairments - at baseline                                 General Comments: able to follow multimodal commands      Exercises      General Comments        Pertinent Vitals/Pain Pain Assessment: Faces Faces Pain Scale: No hurt    Home Living                      Prior Function            PT Goals (current goals can now be found in the care plan section) Progress towards PT goals: Progressing toward goals    Frequency    Min 2X/week      PT Plan Current plan remains appropriate    Co-evaluation              AM-PAC PT "6 Clicks" Mobility   Outcome Measure  Help needed turning from your back to your side while in a flat bed without using bedrails?: A Lot Help needed moving from  lying on your back to sitting on the side of a flat bed without using bedrails?: A Lot Help needed moving to and from a bed to a chair (including a wheelchair)?: A Lot Help needed standing up from a chair using your arms (e.g., wheelchair or bedside chair)?: A Lot Help needed to walk in hospital room?: A Lot Help needed climbing 3-5 steps with a railing? : Total 6 Click Score: 11    End of Session   Activity Tolerance: Patient tolerated treatment well Patient left: in bed;with call bell/phone within reach;with bed alarm set(telesitter in room) Nurse Communication: Mobility status PT Visit Diagnosis: Muscle weakness (generalized) (M62.81);Difficulty in walking, not elsewhere classified (R26.2)     Time: [id number] PT Time Calculation (min) (ACUTE ONLY): 15 min  Charges:  $Gait  Training: 8-22 mins                         Doreatha Massed, PT Acute Rehabilitation

## 2019-02-26 NOTE — Telephone Encounter (Signed)
Pt currently hospitalized with covid and possible lymphoproliferative disorder.  Tried to call son twice today, no answer. Will try to call again tomorrow.

## 2019-02-26 NOTE — Progress Notes (Signed)
Initial Nutrition Assessment  RD working remotely.   DOCUMENTATION CODES:   Not applicable  INTERVENTION:  - will order Ensure Enlive BID, each supplement provides 350 kcal and 20 grams of protein. - will order Magic Cup BID with meals, each supplement provides 290 kcal and 9 grams of protein. - will order daily multivitamin with minerals. - floor staff to encourage PO intakes and aid with feeding assistance, if needed.    NUTRITION DIAGNOSIS:   Increased nutrient needs related to acute illness(COVID-19) as evidenced by estimated needs.   GOAL:   Patient will meet greater than or equal to 90% of their needs  MONITOR:   PO intake, Supplement acceptance, Labs, Weight trends  REASON FOR ASSESSMENT:   Malnutrition Screening Tool, Consult Assessment of nutrition requirement/status  ASSESSMENT:   86 y.o. male with medical history of advanced dementia, HTN, hyperlipidemia, GERD, and BPH. He was admitted on 12/31 with generalized weakness, progressive over several weeks.  In the ED, he had low-grade temperature and was noted to be hypoxic with a room air saturation of 84% associated with tachycardia.  CXR was clear and CT was negative for acute infarct.  CT abdomen/pelvis showed a splenic mass and retroperitoneal adenopathy along with patchy bilateral airspace disease consistent with viral pna.  COVID-19 testing was positive as was RPR and HIV testing (awaiting confirmatory tests).  Flow sheet indicates that patient is a/o to self only and that he consumed 0% of lunch and dinner on 1/1; no other intakes documented since admission. He was seen by SLP on 1/2 and recommendation was made for current diet order (Dysphagia 3, thin liquids).  Per chart review, current weight is 158 lb and weight on 02/05/19 was 164 lb. This indicates 6 lb weight loss (3.6% body weight) in the past 2.5-3 weeks; significant for time frame. Unable to identify malnutrition at this time based on insufficient  information available, but patient is at high risk.  Per notes:  - PNA d/t COVID-19 - delirium and restlessness - screening HIV and syphilis tests positive--HIV confirmatory test negative, syphilis confirmatory test pending - exztensive retroperitoneal, para-aortic, and mediastinal lymphadenopathy; splenic mass--concerning for lymphoma; may not be a great chemo candidate; plan for Palliative consult - BHP with urinary obstruction   Labs reviewed; BUN: 24 mg/dl, Ca: 8.7 mg/dl, AST elevated and up from 1/3. Medications reviewed; 500 mg ascorbic acid/day, 1 packet miralax/day, 100 mg remdesivir x1 dose/day for 4 days starting 1/2, 1 tablet senokot BID, 220 mg zinc sulfate/day.      NUTRITION - FOCUSED PHYSICAL EXAM:  unable to complete for COVID+ patient.   Diet Order:   Diet Order            DIET DYS 3 Room service appropriate? Yes with Assist; Fluid consistency: Thin  Diet effective now              EDUCATION NEEDS:   No education needs have been identified at this time  Skin:  Skin Assessment: Reviewed RN Assessment  Last BM:  1/4  Height:   Ht Readings from Last 1 Encounters:  02/22/19 5\' 6"  (1.676 m)    Weight:   Wt Readings from Last 1 Encounters:  02/22/19 71.5 kg    Ideal Body Weight:  64.5 kg  BMI:  Body mass index is 25.44 kg/m.  Estimated Nutritional Needs:   Kcal:  1790-2000 kcal  Protein:  80-90 grams  Fluid:  >/= 2 L/day     Jarome Matin, MS, RD, LDN,  CNSC Inpatient Clinical Dietitian Pager # [phone] After hours/weekend pager # [phone]

## 2019-02-26 NOTE — Care Management Important Message (Signed)
Important Message  Patient Details IM Letter given to Gabriel Earing RN Case Manager to present to the Patient Name: [name] MRN: [id number] Date of Birth: 10-19-1928   Medicare Important Message Given:  Yes     Kerin Salen 02/26/2019, 12:35 PM

## 2019-02-26 NOTE — Consult Note (Signed)
Palliative care progress note  Reason for consult: Goals of care in light of COVID-19 infection and new splenic and retroperitoneal mass  Palliative care consult received.  Chart reviewed including personal review of pertinent labs and imaging.  Discussed with bedside staff as well as Dr. Posey Pronto.  Briefly, [name] is an 86 year old male with past medical history significant for glaucoma, hard of hearing, advanced dementia, BPH, GERD, hyperlipidemia, and hypertension who presented to [facility] long ED due to worsening weakness for several weeks.  His son reports that he had been having decrease in his nutrition and functional status and he was seen by home health RN and recommendation for evaluation in ED.  ED evaluation revealed CT of abdomen and pelvis with new abdominal retroperitoneal lymphadenopathy and splenic mass as well as being Covid positive.  He has also had CT chest with supraclavicular and hilar lymphadenopathy.  Plan at this point is for biopsy by IR followed by oncology follow-up.  I called and was able to reach patient's son, [name].  [name] reports that they have been talking with his father's physicians and have a good understanding of his care and medical course to this point in time.  I introduced palliative care as specialized medical care for people living with serious illness. It focuses on providing relief from the symptoms and stress of a serious illness. The goal is to improve quality of life for both the patient and the family.  I talked with [name] about the things that are most important to [name].  He reports that family is the most important thing to [name].  He and his wife just celebrated their 69th anniversary.  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  He and his wife are [place] by faith and church has been an important part of his life.  His hobbies include  watching sports with his wife, particularly baseball and college basketball.  At this point, [name] reports that family is invested in plan to continue with any and all offered interventions, particularly until they have better information about what is going on following biopsy.  Discussed plan for continued follow-up by palliative care if he remains admitted later this week once more information is gained from biopsy.  -Full code/full scope -At this time, family is invested in plan for all offered interventions including biopsy by IR.  Discussed with his son, [name], today and family is agreeable to further follow-up once more information is known with results of biopsy. -His wife is his surrogate decision maker and has expressed desire for continued aggressive interventions.  She is currently hospitalized herself and was unavailable to discuss with me today. -If he remains admitted, I will ask another member of the palliative medicine team to follow-up later this week once biopsy results are available.  Total time: 50 minutes Greater than 50%  of this time was spent counseling and coordinating care related to the above assessment and plan.  Micheline Rough, MD [place] [facility] [phone]

## 2019-02-27 ENCOUNTER — Encounter: Payer: Self-pay | Admitting: Family Medicine

## 2019-02-27 DIAGNOSIS — R591 Generalized enlarged lymph nodes: Secondary | ICD-10-CM

## 2019-02-27 DIAGNOSIS — I959 Hypotension, unspecified: Secondary | ICD-10-CM

## 2019-02-27 DIAGNOSIS — R109 Unspecified abdominal pain: Secondary | ICD-10-CM

## 2019-02-27 DIAGNOSIS — E440 Moderate protein-calorie malnutrition: Secondary | ICD-10-CM

## 2019-02-27 DIAGNOSIS — R627 Adult failure to thrive: Secondary | ICD-10-CM

## 2019-02-27 DIAGNOSIS — J1282 Pneumonia due to coronavirus disease 2019: Secondary | ICD-10-CM

## 2019-02-27 DIAGNOSIS — U071 COVID-19: Principal | ICD-10-CM

## 2019-02-27 DIAGNOSIS — G9341 Metabolic encephalopathy: Secondary | ICD-10-CM

## 2019-02-27 DIAGNOSIS — R410 Disorientation, unspecified: Secondary | ICD-10-CM

## 2019-02-27 DIAGNOSIS — R161 Splenomegaly, not elsewhere classified: Secondary | ICD-10-CM

## 2019-02-27 LAB — CBC
HCT: 38.2 % — ABNORMAL LOW (ref 39.0–52.0)
Hemoglobin: 12.5 g/dL — ABNORMAL LOW (ref 13.0–17.0)
MCH: 30.4 pg (ref 26.0–34.0)
MCHC: 32.7 g/dL (ref 30.0–36.0)
MCV: 92.9 fL (ref 80.0–100.0)
RBC: 4.11 MIL/uL — ABNORMAL LOW (ref 4.22–5.81)
RDW: 13.8 % (ref 11.5–15.5)
WBC: 9 10*3/uL (ref 4.0–10.5)
nRBC: 0 % (ref 0.0–0.2)

## 2019-02-27 LAB — D-DIMER, QUANTITATIVE: D-Dimer, Quant: 1.02 ug/mL-FEU — ABNORMAL HIGH (ref 0.00–0.50)

## 2019-02-27 LAB — PROTIME-INR
INR: 1.2 (ref 0.8–1.2)
Prothrombin Time: 14.9 seconds (ref 11.4–15.2)

## 2019-02-27 LAB — BASIC METABOLIC PANEL
Anion gap: 12 (ref 5–15)
BUN: 27 mg/dL — ABNORMAL HIGH (ref 8–23)
CO2: 21 mmol/L — ABNORMAL LOW (ref 22–32)
Calcium: 8.4 mg/dL — ABNORMAL LOW (ref 8.9–10.3)
Chloride: 105 mmol/L (ref 98–111)
Creatinine, Ser: 0.68 mg/dL (ref 0.61–1.24)
GFR calc Af Amer: >60 mL/min (ref >60)
GFR calc non Af Amer: >60 mL/min (ref >60)
Glucose, Bld: 83 mg/dL (ref 70–99)
Potassium: 3.7 mmol/L (ref 3.5–5.1)
Sodium: 138 mmol/L (ref 135–145)

## 2019-02-27 LAB — C-REACTIVE PROTEIN: CRP: 2.6 mg/dL — ABNORMAL HIGH (ref <1.0)

## 2019-02-27 LAB — MAGNESIUM: Magnesium: 2.3 mg/dL (ref 1.7–2.4)

## 2019-02-27 MED ORDER — DEXAMETHASONE SODIUM PHOSPHATE 10 MG/ML IJ SOLN
6.0000 mg | INTRAMUSCULAR | Status: DC
  Administered 2019-02-27 – 2019-03-05 (×6): 6 mg via INTRAVENOUS
  Filled 2019-02-27 (×6): qty 1

## 2019-02-27 NOTE — Progress Notes (Addendum)
PROGRESS NOTE  [name] [id number] DOB: 1928/06/18   PCP: [name], MD  Patient is from: Home  DOA: 02/22/2019 LOS: 5  Brief Narrative / Interim history:  86 y.o. male with a PMH of advanced dementia, hypertension, hyperlipidemia, GERD and BPH who was admitted 02/22/2019 with a chief complaint of generalized weakness progressive over several weeks.  In the ED, he had low-grade temperature but was noted to be hypoxic with a room air saturation of 84% associated with tachycardia.  Chest x-ray was clear and CT was negative for acute infarct.  CT of the abdomen and pelvis showed a splenic mass and retroperitoneal adenopathy along with patchy bilateral airspace disease consistent with viral pneumonia.  COVID-19 testing was positive as was RPR and HIV testing.  T. Pallidum positive. However, RPR titer 1:1. Unclear if this was treated or untreated.  Confirmatory HIV test negative.  Subjective: No major events overnight of this morning.  He somnolent and withdraws to noxious stimuli.  Does not appear to be in distress.  Appears frail.  Objective: Vitals:   02/26/19 2115 02/27/19 0550 02/27/19 1100 02/27/19 1200  BP: 114/87 134/84 (!) 82/64 91/64  Pulse: 83 98 73 72  Resp: 14 16 16 14   Temp: 98.2 F (36.8 C) 98.2 F (36.8 C) 98.1 F (36.7 C) 98.2 F (36.8 C)  TempSrc: Oral Oral Oral Oral  SpO2: 94% 97% 97% 96%  Weight:      Height:        Intake/Output Summary (Last 24 hours) at 02/27/2019 1257 Last data filed at 02/27/2019 1000 Gross per 24 hour  Intake 1158.27 ml  Output --  Net 1158.27 ml   Filed Weights   02/22/19 2056  Weight: 71.5 kg    Examination:  GENERAL: Chronically ill-appearing.  Frail. HEENT: MMM.  Vision and hearing grossly intact.  NECK: Supple.  No apparent JVD.  RESP:  No IWOB.  Fair aeration bilaterally. CVS:  RRR. Heart sounds normal.  ABD/GI/GU: Bowel sounds present. Soft. Non tender.  MSK/EXT:  Moves extremities. No apparent deformity.  No edema.  SKIN: no apparent skin lesion or wound NEURO: Somnolent.  Withdraws to noxious stimuli.  No apparent focal neuro deficit. PSYCH: Somnolent.  Only withdraws to noxious stimuli.  Procedures:  None  Assessment & Plan: Pneumonia due to COVID-19 infection: CT chest with bibasilar groundglass opacities.  Inflammatory markers slightly elevated. Recent Labs    02/26/19 0340 02/27/19 0319  DDIMER 0.79* 1.02*  CRP 1.1* 2.6*  -Continue remdesivir and Decadron 1/1>> -Subcu Lovenox for VTE prophylaxis -Continue supportive care -Trend inflammatory markers  Acute metabolic encephalopathy in patient with advanced dementia: Could be due to COVID-19 infection and delirium.  Patient has hearing loss which could play in.  Steroid could definitely contribute. RPR and T. Pallidum positive although low titer. Unclear if this was treated or not.  -Chief Technology Officer and soft wrist restraints -Continue Seroquel at bedtime and as needed Haldol. -Delirium precautions  Abnormal HIV and RPR: T. Pallidum positive but RPR titer low. Unclear if this is treated or not. Confirmatory test for HIV negative.   -Will discuss with ID  Splenic mass/retroperitoneal lymphadenopathy/mediastinal lymphadenopathy -IR consulted for CT-guided biopsy and recommended oncology input. -Oncology consulted.  Hypotension: SBP low in 80s this morning. -Hold Avapro -Increase IV fluid to 100 cc an hour  Glaucoma -Continue home eyedrops.  BPH: Not on medication at home. -Monitor urinary output.  Addendum in red.    Moderate protein calorie malnutrition Nutrition Problem:  Increased nutrient needs Etiology: acute illness(COVID-19)  Signs/Symptoms: estimated needs  Interventions: Ensure Enlive (each supplement provides 350kcal and 20 grams of protein), Magic cup, MVI   DVT prophylaxis: Subcu Lovenox Code Status: Full code Family Communication: Discussed with patient's son over the phone. Disposition Plan:  Remains inpatient. Consultants: IR, oncology, palliative care   Microbiology summarized: [id number] positive. Urine culture with multiple species.  Sch Meds:  Scheduled Meds: . vitamin C  500 mg Oral Daily  . aspirin EC  81 mg Oral Daily  . atorvastatin  5 mg Oral Daily  . brinzolamide  1 drop Both Eyes TID  . donepezil  10 mg Oral QHS  . enoxaparin (LOVENOX) injection  40 mg Subcutaneous QHS  . feeding supplement (ENSURE ENLIVE)  237 mL Oral BID BM  . irbesartan  150 mg Oral Daily  . latanoprost  1 drop Both Eyes QHS  . multivitamin with minerals  1 tablet Oral Daily  . polyethylene glycol  17 g Oral Daily  . predniSONE  30 mg Oral Q breakfast  . QUEtiapine  25 mg Oral QHS  . senna-docusate  1 tablet Oral BID  . zinc sulfate  220 mg Oral Daily   Continuous Infusions: . lactated ringers 100 mL/hr at 02/27/19 1124   PRN Meds:.haloperidol lactate  Antimicrobials: Anti-infectives (From admission, onward)   Start     Dose/Rate Route Frequency Ordered Stop   02/24/19 1000  remdesivir 100 mg in sodium chloride 0.9 % 100 mL IVPB     100 mg 200 mL/hr over 30 Minutes Intravenous Daily 02/23/19 1519 02/27/19 1120   02/23/19 1600  cefTRIAXone (ROCEPHIN) 1 g in sodium chloride 0.9 % 100 mL IVPB  Status:  Discontinued     1 g 200 mL/hr over 30 Minutes Intravenous Every 24 hours 02/22/19 1751 02/23/19 0818   02/23/19 1600  remdesivir 200 mg in sodium chloride 0.9% 250 mL IVPB     200 mg 580 mL/hr over 30 Minutes Intravenous Once 02/23/19 1519 02/23/19 1624   02/23/19 1500  azithromycin (ZITHROMAX) 500 mg in sodium chloride 0.9 % 250 mL IVPB  Status:  Discontinued     500 mg 250 mL/hr over 60 Minutes Intravenous Every 24 hours 02/22/19 1751 02/23/19 0818   02/22/19 1630  cefTRIAXone (ROCEPHIN) 1 g in sodium chloride 0.9 % 100 mL IVPB     1 g 200 mL/hr over 30 Minutes Intravenous  Once 02/22/19 1615 02/22/19 1729   02/22/19 1630  azithromycin (ZITHROMAX) 500 mg in sodium chloride 0.9 %  250 mL IVPB     500 mg 250 mL/hr over 60 Minutes Intravenous  Once 02/22/19 1615 02/22/19 1902       I have personally reviewed the following labs and images: CBC: Recent Labs  Lab 02/22/19 1123 02/23/19 0322 02/24/19 0221 02/26/19 0340 02/27/19 0319  WBC 5.5 6.1 4.5 14.2* 9.0  NEUTROABS  --   --   --  11.1*  --   HGB 14.6 13.4 13.5 13.7 12.5*  HCT 44.0 39.8 41.6 41.2 38.2*  MCV 93.8 93.6 94.5 92.6 92.9  PLT 182 170 197 PLATELET CLUMPS NOTED ON SMEAR, UNABLE TO ESTIMATE PLATELET CLUMPING, SUGGEST RECOLLECTION OF SAMPLE IN CITRATE TUBE.   BMP &GFR Recent Labs  Lab 02/22/19 1123 02/23/19 0322 02/24/19 0221 02/26/19 0340 02/27/19 0319  NA 135 136 140 137 138  K 3.5 3.1* 3.9 3.6 3.7  CL 97* 101 105 106 105  CO2 27 25 23  21* 21*  GLUCOSE  103* 107* 125* 98 83  BUN 20 14 12  24* 27*  CREATININE 0.71 0.58* 0.47* 0.66 0.68  CALCIUM 9.1 8.5* 8.7* 8.7* 8.4*  MG  --   --   --  2.2 2.3   Estimated Creatinine Clearance: 57.6 mL/min (by C-G formula based on SCr of 0.68 mg/dL). Liver & Pancreas: Recent Labs  Lab 02/22/19 1226 02/26/19 0340  AST 43* 74*  ALT 25 39  ALKPHOS 70 63  BILITOT 0.7 0.6  PROT 6.8 6.2*  ALBUMIN 3.2* 2.9*   Recent Labs  Lab 02/22/19 1226  LIPASE 33   No results for input(s): AMMONIA in the last 168 hours. Diabetic: No results for input(s): HGBA1C in the last 72 hours. No results for input(s): GLUCAP in the last 168 hours. Cardiac Enzymes: No results for input(s): CKTOTAL, CKMB, CKMBINDEX, TROPONINI in the last 168 hours. No results for input(s): PROBNP in the last 8760 hours. Coagulation Profile: Recent Labs  Lab 02/26/19 0340 02/27/19 0319  INR 1.2 1.2   Thyroid Function Tests: No results for input(s): TSH, T4TOTAL, FREET4, T3FREE, THYROIDAB in the last 72 hours. Lipid Profile: No results for input(s): CHOL, HDL, LDLCALC, TRIG, CHOLHDL, LDLDIRECT in the last 72 hours. Anemia Panel: No results for input(s): VITAMINB12, FOLATE,  FERRITIN, TIBC, IRON, RETICCTPCT in the last 72 hours. Urine analysis:    Component Value Date/Time   COLORURINE YELLOW 02/22/2019 1226   APPEARANCEUR CLEAR 02/22/2019 1226   LABSPEC 1.019 02/22/2019 1226   PHURINE 7.0 02/22/2019 1226   GLUCOSEU NEGATIVE 02/22/2019 1226   HGBUR NEGATIVE 02/22/2019 1226   BILIRUBINUR NEGATIVE 02/22/2019 1226   BILIRUBINUR negative 02/05/2019 1610   KETONESUR NEGATIVE 02/22/2019 1226   PROTEINUR NEGATIVE 02/22/2019 1226   UROBILINOGEN 0.2 02/05/2019 1610   UROBILINOGEN 0.2 03/21/2007 0944   NITRITE NEGATIVE 02/22/2019 1226   LEUKOCYTESUR NEGATIVE 02/22/2019 1226   Sepsis Labs: Invalid input(s): PROCALCITONIN, [place]  Microbiology: Recent Results (from the past 240 hour(s))  Urine culture     Status: Abnormal   Collection Time: 02/22/19 12:26 PM   Specimen: Urine, Clean Catch  Result Value Ref Range Status   Specimen Description   Final    URINE, CLEAN CATCH Performed at [facility], [place] [address] [place], [place] [address]    Special Requests   Final    NONE Performed at [facility], [place] [address], [place], [place] [address]    Culture MULTIPLE SPECIES PRESENT, SUGGEST RECOLLECTION (A)  Final   Report Status 02/23/2019 FINAL  Final  SARS CORONAVIRUS 2 (TAT 6-24 HRS) Nasopharyngeal Nasopharyngeal Swab     Status: Abnormal   Collection Time: 02/22/19  5:06 PM   Specimen: Nasopharyngeal Swab  Result Value Ref Range Status   SARS Coronavirus 2 POSITIVE (A) NEGATIVE Final    Comment: RESULT CALLED TO, READ BACK BY AND VERIFIED WITH: B. COLES,RN 2343 02/22/2019 T. TYSOR (NOTE) SARS-CoV-2 target nucleic acids are DETECTED. The SARS-CoV-2 RNA is generally detectable in upper and lower respiratory specimens during the acute phase of infection. Positive results are indicative of the presence of SARS-CoV-2 RNA. Clinical correlation with patient history and other diagnostic information is  necessary  to determine patient infection status. Positive results do not rule out bacterial infection or co-infection with other viruses.  The expected result is Negative. Fact Sheet for Patients: SugarRoll.be Fact Sheet for Healthcare Providers: https://www.woods-mathews.com/ This test is not yet approved or cleared by the [place] FDA and  has been authorized for  detection and/or diagnosis of SARS-CoV-2 by FDA under an Emergency Use Authorization (EUA). This EUA will remain  in effect (meaning this test can be used) for th e duration of the COVID-19 declaration under Section 564(b)(1) of the Act, 21 U.S.C. section 360bbb-3(b)(1), unless the authorization is terminated or revoked sooner. Performed at [facility], [place] [address], [place], [place] [address]     Radiology Studies: No results found.  35 minutes with more than 50% spent in reviewing records, counseling patient/family and coordinating care.   Rafe Mackowski T. Sullivan  If 7PM-7AM, please contact night-coverage [email or web address] Password [place] 02/27/2019, 12:57 PM

## 2019-02-27 NOTE — Progress Notes (Signed)
Preliminary note:  Received consult on this 86 y/o [place] man with splenomegaly and adenopathy. Likely left [place] nodes are most accessible and I have modified the Bx order accordingly. Have also added a CEA.  Full note to follow  GM

## 2019-02-27 NOTE — TOC Progression Note (Signed)
Transition of Care [facility]) - Progression Note    Patient Details  Name: [name] MRN: [id number] Date of Birth: [birth year]  Transition [facility]) CM/SW Contact  Purcell Mouton, RN Phone Number: 02/27/2019, 2:42 PM  Clinical Narrative:    Spoke with son [name] concerning discharge plan. So would like [name] would like [facility] or [facility] for SNF.          Expected Discharge Plan and Services                                                 Social Determinants of Health (SDOH) Interventions    Readmission Risk Interventions No flowsheet data found.

## 2019-02-27 NOTE — Telephone Encounter (Signed)
Spoke with son.  Dad not doing well - some delirium in the mornings. Pending lymph node biopsy today for further evaluation of splenic mass and abdominal LAD. Fortunately wife has been able to spend some time with him (next door hospital room).

## 2019-02-27 NOTE — Progress Notes (Signed)
[name]  [phone] [phone]     ID: [name] DOB: Dec 30, 1928  MR#: [id number]  [id number]  Patient Care Team: [name], MD as PCP - General (Family Medicine) Marylynn Pearson, MD as Consulting Physician (Ophthalmology) Franchot Gallo, MD as Consulting Physician (Urology) Lavella Hammock, DDS as Consulting Physician (Dentistry) Chauncey Cruel, MD OTHER MD:  CHIEF COMPLAINT: adenopathy and splenic lesion  CURRENT TREATMENT: workup in progress   HISTORY OF CURRENT ILLNESS: This 86 year old [place] man followed by Dr. [name] was brought to the emergency room by his son on 02/22/2019 with failure to thrive and progressive confusion.  He was found to have a temperature of 99.4 and a room air saturation of 84%.  Subsequently he was found to be COVID-19 positive.  Further work-up in the emergency room included a chest x-ray which showed no active disease, however  CT of the abdomen and pelvis was obtained also showing patchy areas of airspace disease and a low-attenuation soft tissue mass in the central spleen measuring 3.8 cm. In addition there was retroperitoneal adenopathy measuring up to 4.9 cm in the left para-aortic region.  There was no pelvic adenopathy.  On 0102/2021 CT scan of the chest showed patchy bilateral groundglass opacities and small bilateral pleural effusions.  There was also right hilar and left supraclavicular adenopathy on the chest CT scan.  Noncontrast head CT 02/22/2019 showed only atrophy.  We were consulted today regarding further work-up of the adenopathy and splenic lesion  INTERVAL HISTORY: I visited the patient in his hospital room.  However he was asleep and did not awaken to touch.  There was no family in room  Incidentally the patient's wife [name] was subsequently found to be Covid positive and she is admitted in the room next to the patient  REVIEW OF SYSTEMS: Per the  patient's son prior to hospitalization the patient had been sleeping quite a bit.  There has been mild confusion but mentally no sudden change.  There has been a gradual decrease in the patient's appetite weight and energy.  They had recently obtained home health nursing through Dr. Danise Mina.  PAST MEDICAL HISTORY: Past Medical History:  Diagnosis Date  . Allergic rhinitis   . BPH (benign prostatic hypertrophy) with urinary obstruction 2000   40gm, L lobe 77mm prostate nodule; followed yearly by Dr Diona Fanti  . GERD (gastroesophageal reflux disease)   . Glaucoma   . Herpes zoster ophthalmicus    history  . History of Reeves stones remote   x 1-Ca monhydrate, hyperoxalate urine s/p lithotripsy  . HLD (hyperlipidemia)   . HTN (hypertension)   . Impaired glucose tolerance   . Impaired hearing    uses hearing aides bilaterally  . Osteopenia     PAST SURGICAL HISTORY: Past Surgical History:  Procedure Laterality Date  . APPENDECTOMY  1997  . CATARACT EXTRACTION Right 11/1013   Dr. Venetia Maxon  . COLONOSCOPY  2002   WNL-Kaplan; repeat 2012  . COLONOSCOPY  02/2012   tubular adenoma Deatra Ina)  . Galateo   left-1955; right-1970  . LITHOTRIPSY  2005  . SBO  2008, 2010    FAMILY HISTORY Family History  Problem Relation Age of Onset  . Coronary artery disease Father   . Healthy Mother   . Diabetes Brother   . Breast cancer Sister   . Leukemia Maternal Uncle   . 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 He and his wife [name] just celebrated their 69th wedding anniversary.  She was primary caregiver to her mother who was bedbound for more than 30 years.  The patient has 2 children, his son who works in Risk manager and his daughter who works at the urology center here at [employer] long as a Designer, multimedia.  Both children live with an a couple of miles  of the patient's home  ADVANCED DIRECTIVES: Not in place.  In the absence of any documents to the contrary the patient's wife is his healthcare power of attorney.  The patient's son tells me he feels his mother would be very competent in that role   HEALTH MAINTENANCE: Social History   Tobacco Use  . Smoking status: Former Smoker    Types: Cigarettes  . Smokeless tobacco: Never Used  Substance Use Topics  . Alcohol use: No  . Drug use: No     No Known Allergies  Current Facility-Administered Medications  Medication Dose Route Frequency Provider Last Rate Last Admin  . ascorbic acid (VITAMIN C) tablet 500 mg  500 mg Oral Daily Rama, Venetia Maxon, MD   500 mg at 02/26/19 0926  . aspirin EC tablet 81 mg  81 mg Oral Daily Hosie Poisson, MD   81 mg at 02/26/19 0929  . atorvastatin (LIPITOR) tablet 5 mg  5 mg Oral Daily Hosie Poisson, MD   5 mg at 02/26/19 0926  . brinzolamide (AZOPT) 1 % ophthalmic suspension 1 drop  1 drop Both Eyes TID Hosie Poisson, MD   1 drop at 02/27/19 1630  . [START ON 02/28/2019] dexamethasone (DECADRON) injection 6 mg  6 mg Intravenous Q24H Gonfa, Taye T, MD      . donepezil (ARICEPT) tablet 10 mg  10 mg Oral QHS Hosie Poisson, MD   10 mg at 02/26/19 2100  . enoxaparin (LOVENOX) injection 40 mg  40 mg Subcutaneous QHS Candiss Norse A, PA-C      . feeding supplement (ENSURE ENLIVE) (ENSURE ENLIVE) liquid 237 mL  237 mL Oral BID BM Lavina Hamman, MD      . haloperidol lactate (HALDOL) injection 2 mg  2 mg Intravenous Q6H PRN Rama, Venetia Maxon, MD   2 mg at 02/26/19 0858  . lactated ringers infusion   Intravenous Continuous Wendee Beavers T, MD 100 mL/hr at 02/27/19 1124 Rate Change at 02/27/19 1124  . latanoprost (XALATAN) 0.005 % ophthalmic solution 1 drop  1 drop Both Eyes QHS Hosie Poisson, MD   1 drop at 02/26/19 2048  . multivitamin with minerals tablet 1 tablet  1 tablet Oral Daily Lavina Hamman, MD   1 tablet at 02/26/19 [phone]  . polyethylene glycol  (MIRALAX / GLYCOLAX) packet 17 g  17 g Oral Daily Lavina Hamman, MD   17 g at 02/26/19 0930  . QUEtiapine (SEROQUEL) tablet 25 mg  25 mg Oral QHS Lavina Hamman, MD   25 mg at 02/26/19 2101  . senna-docusate (Senokot-S) tablet 1 tablet  1 tablet Oral BID Lavina Hamman, MD   1 tablet at 02/26/19 2052  . zinc sulfate capsule 220 mg  220 mg Oral Daily Rama, Venetia Maxon, MD   220 mg at 02/26/19 [id number]    OBJECTIVE: Elderly white man asleep in bed at the time of visit  Vitals:   02/27/19 1200 02/27/19 1300  BP: 91/64 121/82  Pulse: 72 67  Resp: 14 14  Temp: 98.2 F (36.8 C) 98 F (36.7 C)  SpO2: 96% 96%     Body mass index is 25.44 kg/m.   Wt Readings from Last 3 Encounters:  02/22/19 157 lb 10.1 oz (71.5 kg)  02/05/19 164 lb 3 oz (74.5 kg)  04/20/18 163 lb 3 oz (74 kg)    LAB RESULTS:  CMP     Component Value Date/Time   NA 138 02/27/2019 0319   K 3.7 02/27/2019 0319   CL 105 02/27/2019 0319   CO2 21 (L) 02/27/2019 0319   GLUCOSE 83 02/27/2019 0319   BUN 27 (H) 02/27/2019 0319   CREATININE 0.68 02/27/2019 0319   CALCIUM 8.4 (L) 02/27/2019 0319   PROT 6.2 (L) 02/26/2019 0340   ALBUMIN 2.9 (L) 02/26/2019 0340   AST 74 (H) 02/26/2019 0340   ALT 39 02/26/2019 0340   ALKPHOS 63 02/26/2019 0340   BILITOT 0.6 02/26/2019 0340   GFRNONAA >60 02/27/2019 0319   GFRAA >60 02/27/2019 0319    No results found for: TOTALPROTELP, ALBUMINELP, A1GS, A2GS, BETS, BETA2SER, GAMS, MSPIKE, SPEI  No results found for: KPAFRELGTCHN, LAMBDASER, KAPLAMBRATIO  Lab Results  Component Value Date   WBC 9.0 02/27/2019   NEUTROABS 11.1 (H) 02/26/2019   HGB 12.5 (L) 02/27/2019   HCT 38.2 (L) 02/27/2019   MCV 92.9 02/27/2019   PLT  02/27/2019    PLATELET CLUMPING, SUGGEST RECOLLECTION OF SAMPLE IN CITRATE TUBE.    @LASTCHEMISTRY @  No results found for: LABCA2  No components found for: [id number]  Recent Labs  Lab 02/27/19 0319  INR 1.2    No results found for: LABCA2  No  results found for: [id number]  No results found for: [id number]  No results found for: [id number]  No results found for: CA2729  No components found for: HGQUANT  No results found for: CEA1 / No results found for: CEA1   No results found for: AFPTUMOR  No results found for: CHROMOGRNA  No results found for: PSA1  Admission on 02/22/2019  Component Date Value Ref Range Status  . Sodium 02/22/2019 135  135 - 145 mmol/L Final  . Potassium 02/22/2019 3.5  3.5 - 5.1 mmol/L Final  . Chloride 02/22/2019 97* 98 - 111 mmol/L Final  . CO2 02/22/2019 27  22 - 32 mmol/L Final  . Glucose, Bld 02/22/2019 103* 70 - 99 mg/dL Final  . BUN 02/22/2019 20  8 - 23 mg/dL Final  . Creatinine, Ser 02/22/2019 0.71  0.61 - 1.24 mg/dL Final  . Calcium 02/22/2019 9.1  8.9 - 10.3 mg/dL Final  . GFR calc non Af Amer 02/22/2019 >60  >60 mL/min Final  . GFR calc Af Amer 02/22/2019 >60  >60 mL/min Final  . Anion gap 02/22/2019 11  5 - 15 Final   Performed at [facility], [place] [address] [place], [place] [address]  . WBC 02/22/2019 5.5  4.0 - 10.5 K/uL Final  . RBC 02/22/2019 4.69  4.22 - 5.81 MIL/uL Final  . Hemoglobin 02/22/2019 14.6  13.0 - 17.0 g/dL Final  . HCT 02/22/2019 44.0  39.0 - 52.0 % Final  . MCV 02/22/2019 93.8  80.0 - 100.0 fL Final  . MCH 02/22/2019 31.1  26.0 - 34.0 pg Final  . MCHC 02/22/2019 33.2  30.0 - 36.0 g/dL Final  . RDW 02/22/2019 13.5  11.5 - 15.5 % Final  . Platelets 02/22/2019 182  150 -  400 K/uL Final  . nRBC 02/22/2019 0.0  0.0 - 0.2 % Final   Performed at [facility], [place] [address], [place], [place] [address]  . Color, Urine 02/22/2019 YELLOW  YELLOW Final  . APPearance 02/22/2019 CLEAR  CLEAR Final  . Specific Gravity, Urine 02/22/2019 1.019  1.005 - 1.030 Final  . pH 02/22/2019 7.0  5.0 - 8.0 Final  . Glucose, UA 02/22/2019 NEGATIVE  NEGATIVE mg/dL Final  . Hgb urine dipstick 02/22/2019 NEGATIVE  NEGATIVE Final  . Bilirubin Urine  02/22/2019 NEGATIVE  NEGATIVE Final  . Ketones, ur 02/22/2019 NEGATIVE  NEGATIVE mg/dL Final  . Protein, ur 02/22/2019 NEGATIVE  NEGATIVE mg/dL Final  . Nitrite 02/22/2019 NEGATIVE  NEGATIVE Final  . Chalmers Guest 02/22/2019 NEGATIVE  NEGATIVE Final   Performed at [facility], [place] [address] [place], [place] [address]  . Total Protein 02/22/2019 6.8  6.5 - 8.1 g/dL Final  . Albumin 02/22/2019 3.2* 3.5 - 5.0 g/dL Final  . AST 02/22/2019 43* 15 - 41 U/L Final  . ALT 02/22/2019 25  0 - 44 U/L Final  . Alkaline Phosphatase 02/22/2019 70  38 - 126 U/L Final  . Total Bilirubin 02/22/2019 0.7  0.3 - 1.2 mg/dL Final  . Bilirubin, Direct 02/22/2019 0.1  0.0 - 0.2 mg/dL Final  . Indirect Bilirubin 02/22/2019 0.6  0.3 - 0.9 mg/dL Final   Performed at [facility], [place] [address], [place], [place] [address]  . Lipase 02/22/2019 33  11 - 51 U/L Final   Performed at [facility], [place] [address] [place], [place] [address]  . SARS Coronavirus 2 Ag 02/22/2019 NEGATIVE  NEGATIVE Final   Comment: (NOTE) SARS-CoV-2 antigen NOT DETECTED.  Negative results are presumptive.  Negative results do not preclude SARS-CoV-2 infection and should not be used as the sole basis for treatment or other patient management decisions, including infection  control decisions, particularly in the presence of clinical signs and  symptoms consistent with COVID-19, or in those who have been in contact with the virus.  Negative results must be combined with clinical observations, patient history, and epidemiological information. The expected result is Negative. Fact Sheet for Patients: PodPark.tn Fact Sheet for Healthcare Providers: GiftContent.is This test is not yet approved or cleared by the [place] FDA and  has been authorized for detection and/or diagnosis of SARS-CoV-2 by FDA under an Emergency Use  Authorization (EUA).  This EUA will remain in effect (meaning this test can be used) for the duration of  the COVID-19 de                          claration under Section 564(b)(1) of the Act, 21 U.S.C. section 360bbb-3(b)(1), unless the authorization is terminated or revoked sooner.   Marland Kitchen Specimen Description 02/22/2019    Final                   Value:URINE, CLEAN CATCH Performed at [facility], [place] [address], [place], [place] [address]   . Special Requests 02/22/2019    Final                   Value:NONE Performed at [facility], [place] [address], [place], [place] [address]   . Culture 02/22/2019 MULTIPLE SPECIES PRESENT, SUGGEST RECOLLECTION*  Final  . Report Status 02/22/2019 02/23/2019 FINAL   Final  . SARS Coronavirus 2 02/22/2019  POSITIVE* NEGATIVE Final   Comment: RESULT CALLED TO, READ BACK BY AND VERIFIED WITH: B. COLES,RN 2343 02/22/2019 T. TYSOR (NOTE) SARS-CoV-2 target nucleic acids are DETECTED. The SARS-CoV-2 RNA is generally detectable in upper and lower respiratory specimens during the acute phase of infection. Positive results are indicative of the presence of SARS-CoV-2 RNA. Clinical correlation with patient history and other diagnostic information is  necessary to determine patient infection status. Positive results do not rule out bacterial infection or co-infection with other viruses.  The expected result is Negative. Fact Sheet for Patients: SugarRoll.be Fact Sheet for Healthcare Providers: https://www.woods-mathews.com/ This test is not yet approved or cleared by the [place] FDA and  has been authorized for detection and/or diagnosis of SARS-CoV-2 by FDA under an Emergency Use Authorization (EUA). This EUA will remain  in effect (meaning this test can be used) for th                          e duration of the COVID-19 declaration under Section 564(b)(1) of the Act, 21  U.S.C. section 360bbb-3(b)(1), unless the authorization is terminated or revoked sooner. Performed at [facility], [place] [address], [place], [place] [address]   . Strep Pneumo Urinary Antigen 02/22/2019 NEGATIVE  NEGATIVE Final   Comment:        Infection due to S. pneumoniae cannot be absolutely ruled out since the antigen present may be below the detection limit of the test. Performed at [facility], [address], [place], [place] [address]   . L. pneumophila Serogp 1 Ur Ag 02/22/2019 Negative  Negative Final   Comment: (NOTE) Presumptive negative for L. pneumophila serogroup 1 antigen in urine, suggesting no recent or current infection. Legionnaires' disease cannot be ruled out since other serogroups and species may also cause disease. Performed At: [facility] [place], [place] [id number] Rush Farmer MD [id number]   . Source of Sample 02/22/2019 URINE, RANDOM   Final   Performed at [facility], [place] [address], [place], [place] [address]  . Vitamin B-12 02/23/2019 564  180 - 914 pg/mL Final   Comment: (NOTE) This assay is not validated for testing neonatal or myeloproliferative syndrome specimens for Vitamin B12 levels. Performed at [facility], [place] [address] [place], [place] [address]   . WBC 02/23/2019 6.1  4.0 - 10.5 K/uL Final  . RBC 02/23/2019 4.25  4.22 - 5.81 MIL/uL Final  . Hemoglobin 02/23/2019 13.4  13.0 - 17.0 g/dL Final  . HCT 02/23/2019 39.8  39.0 - 52.0 % Final  . MCV 02/23/2019 93.6  80.0 - 100.0 fL Final  . MCH 02/23/2019 31.5  26.0 - 34.0 pg Final  . MCHC 02/23/2019 33.7  30.0 - 36.0 g/dL Final  . RDW 02/23/2019 13.5  11.5 - 15.5 % Final  . Platelets 02/23/2019 170  150 - 400 K/uL Final  . nRBC 02/23/2019 0.0  0.0 - 0.2 % Final   Performed at [facility], [place] [address], [place],  [address]  . Sodium 02/23/2019 136  135 - 145 mmol/L Final    . Potassium 02/23/2019 3.1* 3.5 - 5.1 mmol/L Final  . Chloride 02/23/2019 101  98 - 111 mmol/L Final  . CO2 02/23/2019 25  22 - 32 mmol/L Final  . Glucose, Bld 02/23/2019 107* 70 - 99 mg/dL Final  . BUN 02/23/2019 14  8 - 23 mg/dL Final  .  Creatinine, Ser 02/23/2019 0.58* 0.61 - 1.24 mg/dL Final  . Calcium 02/23/2019 8.5* 8.9 - 10.3 mg/dL Final  . GFR calc non Af Amer 02/23/2019 >60  >60 mL/min Final  . GFR calc Af Amer 02/23/2019 >60  >60 mL/min Final  . Anion gap 02/23/2019 10  5 - 15 Final   Performed at [facility], [place] [address] [place], [place] [address]  . HIV Screen 4th Generation wRfx 02/23/2019 Reactive* NON REACTIVE Final   Comment: (NOTE) Reactive result does not distinguish HIV-1 p24 antigen, HIV-1  antibody, HIV-2 antibody, and HIV-1 group O antibody. Results  reactive by HIV Antigen/Antibody EIA must be confirmed. Sent for  confirmation. Performed at [facility], [place] [address], [place], [place] [address]   . TSH 02/23/2019 1.002  0.350 - 4.500 uIU/mL Final   Comment: Performed by a 3rd Generation assay with a functional sensitivity of <=0.01 uIU/mL. Performed at [facility], [place] [address] [place], [place] [address]   . RPR Ser Ql 02/23/2019 Reactive* NON REACTIVE Final   SENT FOR CONFIRMATION  . RPR Titer 02/23/2019 1:1   Final   Performed at [facility], [place] [address] [place], [place] [address]  . ABO/RH(D) 02/23/2019    Final                   Value:A POS Performed at [facility], [place] [address] [place], [place] [address]   . CRP 02/23/2019 6.2* <1.0 mg/dL Final   Performed at [place] [address], [place], [place] [address]  . D-Dimer, Quant 02/23/2019 0.61* 0.00 - 0.50 ug/mL-FEU Final   Comment: (NOTE) At the manufacturer cut-off of 0.50 ug/mL FEU, this assay has been documented to exclude PE with a sensitivity and negative predictive value of 97 to  99%.  At this time, this assay has not been approved by the FDA to exclude DVT/VTE. Results should be correlated with clinical presentation. Performed at [facility], [place] [address] [place], [place] [address]   . Ferritin 02/23/2019 772* 24 - 336 ng/mL Final   Performed at [place] [address], [place], [place] [address]  . Fibrinogen 02/23/2019 589* 210 - 475 mg/dL Final   Performed at [facility], [place] [address], [place], [place] [address]  . LDH 02/23/2019 215* 98 - 192 U/L Final   Performed at [facility], [place] [address], [place], [place] [address]  . Procalcitonin 02/23/2019 <0.10  ng/mL Final   Comment:        Interpretation: PCT (Procalcitonin) <= 0.5 ng/mL: Systemic infection (sepsis) is not likely. Local bacterial infection is possible. (NOTE)       Sepsis PCT Algorithm           Lower Respiratory Tract                                      Infection PCT Algorithm    ----------------------------     ----------------------------         PCT < 0.25 ng/mL                PCT < 0.10 ng/mL         Strongly encourage             Strongly discourage   discontinuation of antibiotics  initiation of antibiotics    ----------------------------     -----------------------------       PCT 0.25 - 0.50 ng/mL            PCT 0.10 - 0.25 ng/mL               OR       >80% decrease in PCT            Discourage initiation of                                            antibiotics      Encourage discontinuation           of antibiotics    ----------------------------     -----------------------------         PCT >= 0.50 ng/mL              PCT 0.26 - 0.50 ng/mL               AND                                 <80% decrease in PCT             Encourage initiation of                                             antibiotics       Encourage continuation           of antibiotics    ----------------------------      -----------------------------        PCT >= 0.50 ng/mL                  PCT > 0.50 ng/mL               AND         increase in PCT                  Strongly encourage                                      initiation of antibiotics    Strongly encourage escalation           of antibiotics                                     -----------------------------                                           PCT <= 0.25 ng/mL                                                 OR                                        >  80% decrease in PCT                                     Discontinue / Do not initiate                                             antibiotics Performed at [place] [address] [place], [place] [address]   . T Pallidum Abs 02/23/2019 Reactive* Non Reactive Final   Comment: (NOTE) Performed At: [facility] [address] [place], [place] [id number] Rush Farmer MD [id number]   . HIV 1 AB 02/23/2019 Negative  Negative Final  . HIV 2 AB 02/23/2019 Negative  Negative Final  . Note 02/23/2019 Negative   Final   Comment: (NOTE) Recommend follow-up testing for HIV-1 RNA. Performed At: [facility] [place], [place] [id number] Rush Farmer MD [id number]   . HIV 1 RNA Quant 02/24/2019 <20  copies/mL Corrected   Comment: (NOTE) HIV-1 RNA not detected The reportable range for this assay is 20 to 10,000,000 copies HIV-1 RNA/mL. Performed At: [facility] [place], [place] [id number] Rush Farmer MD [id number]   . LOG10 HIV-1 RNA 02/24/2019 UNABLE TO CALCULATE  log10copy/mL Final   Comment: (NOTE) Unable to calculate result since non-numeric result obtained for component test.   . WBC 02/24/2019 4.5  4.0 - 10.5 K/uL Final  . RBC 02/24/2019 4.40  4.22 - 5.81 MIL/uL Final  . Hemoglobin 02/24/2019 13.5  13.0 - 17.0 g/dL Final  . HCT 02/24/2019 41.6  39.0 - 52.0 % Final  . MCV 02/24/2019  94.5  80.0 - 100.0 fL Final  . MCH 02/24/2019 30.7  26.0 - 34.0 pg Final  . MCHC 02/24/2019 32.5  30.0 - 36.0 g/dL Final  . RDW 02/24/2019 13.5  11.5 - 15.5 % Final  . Platelets 02/24/2019 197  150 - 400 K/uL Final  . nRBC 02/24/2019 0.0  0.0 - 0.2 % Final   Performed at [facility], [place] [address] [place], [place] [address]  . Sodium 02/24/2019 140  135 - 145 mmol/L Final  . Potassium 02/24/2019 3.9  3.5 - 5.1 mmol/L Final   Comment: DELTA CHECK NOTED NO VISIBLE HEMOLYSIS   . Chloride 02/24/2019 105  98 - 111 mmol/L Final  . CO2 02/24/2019 23  22 - 32 mmol/L Final  . Glucose, Bld 02/24/2019 125* 70 - 99 mg/dL Final  . BUN 02/24/2019 12  8 - 23 mg/dL Final  . Creatinine, Ser 02/24/2019 0.47* 0.61 - 1.24 mg/dL Final  . Calcium 02/24/2019 8.7* 8.9 - 10.3 mg/dL Final  . GFR calc non Af Amer 02/24/2019 >60  >60 mL/min Final  . GFR calc Af Amer 02/24/2019 >60  >60 mL/min Final  . Anion gap 02/24/2019 12  5 - 15 Final   Performed at [facility], [place] [address] [place], [place] [address]  . CRP 02/26/2019 1.1* <1.0 mg/dL Final   Performed at [place] [address], [place], [place] [address]  . D-Dimer, Quant 02/26/2019 0.79* 0.00 - 0.50 ug/mL-FEU Final   Comment: (NOTE) At the manufacturer cut-off of 0.50 ug/mL FEU, this assay has been documented to exclude PE with a sensitivity and negative predictive value of  97 to 99%.  At this time, this assay has not been approved by the FDA to exclude DVT/VTE. Results should be correlated with clinical presentation. Performed at [facility], [place] [address] [place], [place] [address]   . Sodium 02/26/2019 137  135 - 145 mmol/L Final  . Potassium 02/26/2019 3.6  3.5 - 5.1 mmol/L Final  . Chloride 02/26/2019 106  98 - 111 mmol/L Final  . CO2 02/26/2019 21* 22 - 32 mmol/L Final  . Glucose, Bld 02/26/2019 98  70 - 99 mg/dL Final  . BUN 02/26/2019 24* 8 - 23 mg/dL  Final  . Creatinine, Ser 02/26/2019 0.66  0.61 - 1.24 mg/dL Final  . Calcium 02/26/2019 8.7* 8.9 - 10.3 mg/dL Final  . Total Protein 02/26/2019 6.2* 6.5 - 8.1 g/dL Final  . Albumin 02/26/2019 2.9* 3.5 - 5.0 g/dL Final  . AST 02/26/2019 74* 15 - 41 U/L Final  . ALT 02/26/2019 39  0 - 44 U/L Final  . Alkaline Phosphatase 02/26/2019 63  38 - 126 U/L Final  . Total Bilirubin 02/26/2019 0.6  0.3 - 1.2 mg/dL Final  . GFR calc non Af Amer 02/26/2019 >60  >60 mL/min Final  . GFR calc Af Amer 02/26/2019 >60  >60 mL/min Final  . Anion gap 02/26/2019 10  5 - 15 Final   Performed at [facility], [place] [address], [place], [place] [address]  . WBC 02/26/2019 14.2* 4.0 - 10.5 K/uL Final  . RBC 02/26/2019 4.45  4.22 - 5.81 MIL/uL Final  . Hemoglobin 02/26/2019 13.7  13.0 - 17.0 g/dL Final  . HCT 02/26/2019 41.2  39.0 - 52.0 % Final  . MCV 02/26/2019 92.6  80.0 - 100.0 fL Final  . MCH 02/26/2019 30.8  26.0 - 34.0 pg Final  . MCHC 02/26/2019 33.3  30.0 - 36.0 g/dL Final  . RDW 02/26/2019 13.7  11.5 - 15.5 % Final  . Platelets 02/26/2019 PLATELET CLUMPS NOTED ON SMEAR, UNABLE TO ESTIMATE  150 - 400 K/uL Final  . nRBC 02/26/2019 0.0  0.0 - 0.2 % Final  . Neutrophils Relative % 02/26/2019 79  % Final  . Neutro Abs 02/26/2019 11.1* 1.7 - 7.7 K/uL Final  . Lymphocytes Relative 02/26/2019 8  % Final  . Lymphs Abs 02/26/2019 1.2  0.7 - 4.0 K/uL Final  . Monocytes Relative 02/26/2019 13  % Final  . Monocytes Absolute 02/26/2019 1.8* 0.1 - 1.0 K/uL Final  . Eosinophils Relative 02/26/2019 0  % Final  . Eosinophils Absolute 02/26/2019 0.0  0.0 - 0.5 K/uL Final  . Basophils Relative 02/26/2019 0  % Final  . Basophils Absolute 02/26/2019 0.0  0.0 - 0.1 K/uL Final  . Immature Granulocytes 02/26/2019 0  % Final  . Abs Immature Granulocytes 02/26/2019 0.06  0.00 - 0.07 K/uL Final  . Reactive, Benign Lymphocytes 02/26/2019 PRESENT   Final   Performed at [facility], [place]  [address] [place], [place] [address]  . Magnesium 02/26/2019 2.2  1.7 - 2.4 mg/dL Final   Performed at [place] [address] [place], [place] [address]  . Prothrombin Time 02/26/2019 15.1  11.4 - 15.2 seconds Final  . INR 02/26/2019 1.2  0.8 - 1.2 Final   Comment: (NOTE) INR goal varies based on device and disease states. Performed at [facility], [place] [address], [place], [place] [address]   . Total lymphocyte count 02/26/2019 931* 1,000 - 4,000 /uL Final  .  CD4% 02/26/2019 49  33 - 65 % Final  . CD4 absolute 02/26/2019 459  400 - 1,790 /uL Final  . CD8tox 02/26/2019 7* 12 - 40 % Final  . CD8 T Cell Abs 02/26/2019 67* 190 - 1,000 /uL Final  . Ratio 02/26/2019 6.91* 1.0 - 3.0 Final   Performed at [facility], [place] [address] [place], [place] [address]  . CRP 02/27/2019 2.6* <1.0 mg/dL Final   Performed at [place] [address], [place], [place] [address]  . D-Dimer, Quant 02/27/2019 1.02* 0.00 - 0.50 ug/mL-FEU Final   Comment: (NOTE) At the manufacturer cut-off of 0.50 ug/mL FEU, this assay has been documented to exclude PE with a sensitivity and negative predictive value of 97 to 99%.  At this time, this assay has not been approved by the FDA to exclude DVT/VTE. Results should be correlated with clinical presentation. Performed at [facility], [place] [address], [place], [place] [address]   . Prothrombin Time 02/27/2019 14.9  11.4 - 15.2 seconds Final  . INR 02/27/2019 1.2  0.8 - 1.2 Final   Comment: (NOTE) INR goal varies based on device and disease states. Performed at [facility], [place] [address] [place], [place] [address]   . Sodium 02/27/2019 138  135 - 145 mmol/L Final  . Potassium 02/27/2019 3.7  3.5 - 5.1 mmol/L Final  . Chloride 02/27/2019 105  98 - 111 mmol/L Final  . CO2 02/27/2019 21* 22 - 32 mmol/L Final  . Glucose, Bld 02/27/2019 83  70  - 99 mg/dL Final  . BUN 02/27/2019 27* 8 - 23 mg/dL Final  . Creatinine, Ser 02/27/2019 0.68  0.61 - 1.24 mg/dL Final  . Calcium 02/27/2019 8.4* 8.9 - 10.3 mg/dL Final  . GFR calc non Af Amer 02/27/2019 >60  >60 mL/min Final  . GFR calc Af Amer 02/27/2019 >60  >60 mL/min Final  . Anion gap 02/27/2019 12  5 - 15 Final   Performed at [facility], [place] [address] [place], [place] [address]  . WBC 02/27/2019 9.0  4.0 - 10.5 K/uL Final  . RBC 02/27/2019 4.11* 4.22 - 5.81 MIL/uL Final  . Hemoglobin 02/27/2019 12.5* 13.0 - 17.0 g/dL Final  . HCT 02/27/2019 38.2* 39.0 - 52.0 % Final  . MCV 02/27/2019 92.9  80.0 - 100.0 fL Final  . MCH 02/27/2019 30.4  26.0 - 34.0 pg Final  . MCHC 02/27/2019 32.7  30.0 - 36.0 g/dL Final  . RDW 02/27/2019 13.8  11.5 - 15.5 % Final  . Platelets 02/27/2019 PLATELET CLUMPING, SUGGEST RECOLLECTION OF SAMPLE IN CITRATE TUBE.  150 - 400 K/uL Final   Comment: Immature Platelet Fraction may be clinically indicated, consider ordering this additional test [id number]   . nRBC 02/27/2019 0.0  0.0 - 0.2 % Final   Performed at [place] [address] [place], [place] [address]  . Magnesium 02/27/2019 2.3  1.7 - 2.4 mg/dL Final   Performed at [place] [address], [place], [place] [address]    (this displays the last labs from the last 3 days)  No results found for: TOTALPROTELP, ALBUMINELP, A1GS, A2GS, BETS, BETA2SER, GAMS, MSPIKE, SPEI (this displays SPEP labs)  No results found for: KPAFRELGTCHN, LAMBDASER, KAPLAMBRATIO (kappa/lambda light chains)  No results found for: HGBA, HGBA2QUANT, HGBFQUANT, HGBSQUAN (Hemoglobinopathy evaluation)   Lab Results  Component Value Date   LDH 215 (H) 02/23/2019  No results found for: IRON, TIBC, IRONPCTSAT (Iron and TIBC)  Lab Results  Component Value Date   FERRITIN 772 (H) 02/23/2019    Urinalysis    Component Value Date/Time   COLORURINE  YELLOW 02/22/2019 1226   APPEARANCEUR CLEAR 02/22/2019 1226   LABSPEC 1.019 02/22/2019 1226   PHURINE 7.0 02/22/2019 1226   GLUCOSEU NEGATIVE 02/22/2019 1226   HGBUR NEGATIVE 02/22/2019 1226   BILIRUBINUR NEGATIVE 02/22/2019 1226   BILIRUBINUR negative 02/05/2019 1610   KETONESUR NEGATIVE 02/22/2019 1226   PROTEINUR NEGATIVE 02/22/2019 1226   UROBILINOGEN 0.2 02/05/2019 1610   UROBILINOGEN 0.2 03/21/2007 0944   NITRITE NEGATIVE 02/22/2019 1226   LEUKOCYTESUR NEGATIVE 02/22/2019 1226     STUDIES: DG Lumbar Spine Complete  Result Date: 02/06/2019 CLINICAL DATA:  Lower back pain. EXAM: LUMBAR SPINE - COMPLETE 4+ VIEW COMPARISON:  CT 03/21/2007. FINDINGS: Lumbar spine scoliosis concave left. Diffuse multilevel degenerative change. No acute bony abnormality identified. Aortoiliac and visceral atherosclerotic vascular calcification again noted. IMPRESSION: 1. Lumbar spine scoliosis concave left. Diffuse multilevel degenerative change. No acute abnormality identified. 2. Large amount of stool noted throughout the colon suggesting constipation. 3.  Aortoiliac and visceral atherosclerotic vascular disease. Electronically Signed   By: Marcello Moores  Register   On: 02/06/2019 07:06   CT Head Wo Contrast  Result Date: 02/22/2019 CLINICAL DATA:  Altered mental status with fatigue.  Dehydration. EXAM: CT HEAD WITHOUT CONTRAST TECHNIQUE: Contiguous axial images were obtained from the base of the skull through the vertex without intravenous contrast. COMPARISON:  None. FINDINGS: Brain: There is moderate diffuse atrophy. There is no intracranial mass, hemorrhage, extra-axial fluid collection, or midline shift. Brain parenchyma appears unremarkable. No evident acute infarct. Vascular: There is no hyperdense vessel. There are foci of calcification in each carotid siphon region. Skull: Bony calvarium appears intact. Sinuses/Orbits: There is a retention cyst in the inferior posterior right maxillary antrum. There is  mucosal thickening and opacification involving multiple ethmoid air cells. Orbits appear symmetric bilaterally except for evidence of previous cataract surgery on the right. Other: Mastoid air cells are clear. IMPRESSION: 1. Atrophy. Brain parenchyma appears unremarkable. No acute infarct. No mass or hemorrhage. 2.  Foci of arterial vascular calcification noted. 3.  Foci of paranasal sinus disease evident. Electronically Signed   By: Lowella Grip III M.D.   On: 02/22/2019 14:57   CT CHEST W CONTRAST  Result Date: 02/24/2019 CLINICAL DATA:  Provided history of colon cancer of unknown primary, surveillance Technologist notes state pneumonia due to COVID-19. EXAM: CT CHEST WITH CONTRAST TECHNIQUE: Multidetector CT imaging of the chest was performed during intravenous contrast administration. CONTRAST:  90mL OMNIPAQUE IOHEXOL 300 MG/ML  SOLN COMPARISON:  Chest radiograph 02/22/2019. Abdominal CT 02/12/2019. No remote prior chest CT. FINDINGS: Cardiovascular: Aortic atherosclerosis. No aneurysm or dissection. Coronary artery calcifications. Normal heart size. No pericardial effusion. Mediastinum/Nodes: Probable enlarged right hilar node measuring 18 mm short axis, series 2, image 66. no density appears similar to the adjacent pulmonary vasculature, but appears separate. Left supraclavicular adenopathy, largest node measuring 2 cm, series 2, image 19. No axillary adenopathy. No thyroid nodule. Esophagus slightly patulous but no wall thickening. Lungs/Pleura: Patchy bilateral ground-glass opacities within both lungs, right greater than left. Small bilateral pleural effusions. Background lung opacity limits assessment for pulmonary nodule or mass, no dominant pulmonary mass visualized. Calcified granuloma in the right lower lobe. Mild elevation of right hemidiaphragm. Upper Abdomen: Assessed on recent abdominal CT. Retroperitoneal adenopathy. Hepatic and renal cysts and central splenic mass.  Musculoskeletal:  Multilevel degenerative change in the spine. There are no acute or suspicious osseous abnormalities. No evidence of lytic or blastic osseous lesion. IMPRESSION: 1. Right hilar and left supraclavicular adenopathy. Given abdominal adenopathy, findings suspicious for lymphoproliferative disorder. 2. Patchy bilateral ground-glass opacities within both lungs, right greater than left consistent with COVID pneumonia. 3. Small bilateral pleural effusions. 4. Aortic Atherosclerosis (ICD10-I70.0). Coronary artery calcifications. Electronically Signed   By: Keith Rake M.D.   On: 02/24/2019 23:25   CT ABDOMEN PELVIS W CONTRAST  Result Date: 02/22/2019 CLINICAL DATA:  Abdominal distension. Anorexia. EXAM: CT ABDOMEN AND PELVIS WITH CONTRAST TECHNIQUE: Multidetector CT imaging of the abdomen and pelvis was performed using the standard protocol following bolus administration of intravenous contrast. CONTRAST:  150mL OMNIPAQUE IOHEXOL 300 MG/ML  SOLN COMPARISON:  03/21/2007 FINDINGS: Lower Chest: Patchy areas of airspace disease are seen bilaterally mainly in the lower lobes, suspicious for viral pneumonia or inflammatory process. No evidence of pleural effusion. Hepatobiliary: No hepatic masses identified. Multiple small hepatic cysts are stable. Gallbladder is unremarkable. No evidence of biliary ductal dilatation. Pancreas:  No mass or inflammatory changes. Spleen: No evidence of splenomegaly. A new low-attenuation soft tissue mass is seen in the central spleen which measures 3.8 x 3.4 cm. Adrenals/Urinary Tract: Several large renal cysts are again seen bilaterally. No masses identified. No evidence of ureteral calculi or hydronephrosis. Unremarkable unopacified urinary bladder. Stomach/Bowel: No evidence of obstruction, inflammatory process or abnormal fluid collections. Vascular/Lymphatic: New abdominal retroperitoneal lymphadenopathy is seen in the region of the celiac axis and throughout the left paraaortic and  aortocaval spaces. Largest conglomeration of lymph nodes in the left paraaortic region measures 4.9 x 3.1 cm on image 43/2. No pathologically enlarged lymph nodes seen within the pelvis. Aortic atherosclerosis incidentally noted. No abdominal aortic aneurysm. Reproductive:  No mass or other significant abnormality identified. Other:  None. Musculoskeletal:  No suspicious bone lesions identified. IMPRESSION: 1. New abdominal retroperitoneal lymphadenopathy and splenic mass. This is likely due to lymphoproliferative disorder, with metastatic disease considered less likely. 2. Patchy bilateral airspace disease mainly in the lower lobes, suspicious for viral pneumonia or inflammatory process. Pulmonary involvement by lymphoma is considered less likely but cannot be excluded. Recommend clinical correlation and COVID-19 virus testing. Electronically Signed   By: Marlaine Hind M.D.   On: 02/22/2019 15:05   DG Chest Portable 1 View  Result Date: 02/22/2019 CLINICAL DATA:  Increasing shortness of breath over the past 7 days EXAM: PORTABLE CHEST 1 VIEW COMPARISON:  10/17/2003 FINDINGS: Cardiac shadow is accentuated by the portable technique. Aortic calcifications are noted. Elevation the right hemidiaphragm is noted new from the prior exam. No focal infiltrate or sizable effusion is seen. No bony abnormality is noted. IMPRESSION: No active disease. Electronically Signed   By: Inez Catalina M.D.   On: 02/22/2019 14:03    ASSESSMENT: 86 y.o. Shea Stakes man presenting with failure to thrive, mild confusion and intermittent abdominal discomfort, found to be COVID-19 positive but also to have retroperitoneal and right hilar and left supraclavicular adenopathy  (1) core needle biopsy scheduled for 02/27/2018  PLAN: I spoke with the patient's son [name] and oriented him to the possible diagnosis of lymphoma.  He understands that until we have some tissue we really do not know what we are dealing with not only what kind of  cancer it is but even whether it is cancer at all.  If this turns out to be lymphoma, as seems likely, there are more than  40 subtypes.  Some are chronic but not curable.  These are generally easily treatable and controllable for a long time.  Others are more aggressive and do require more aggressive treatment.  The splenic lesion could well be a lymphoma deposit but does not warrant specific biopsy.  Once I have a definitive diagnosis we will have a family get together including the patient's children wife and the patient himself to determine the optimal plan.  I will keep the family informed through the son soon as I have any further information  Appreciate consulting on this patient.  Please let me know if I can be of further help at this point.  Chauncey Cruel, MD   02/27/2019 7:01 PM Medical Oncology and Hematology [facility] [address] [place], [place] [address] Tel. [phone]    Fax. [phone]

## 2019-02-28 ENCOUNTER — Telehealth: Payer: Self-pay

## 2019-02-28 ENCOUNTER — Inpatient Hospital Stay (HOSPITAL_COMMUNITY): Payer: Medicare Other

## 2019-02-28 ENCOUNTER — Encounter (HOSPITAL_COMMUNITY): Payer: Self-pay | Admitting: Internal Medicine

## 2019-02-28 DIAGNOSIS — R4182 Altered mental status, unspecified: Secondary | ICD-10-CM

## 2019-02-28 DIAGNOSIS — Z87891 Personal history of nicotine dependence: Secondary | ICD-10-CM

## 2019-02-28 DIAGNOSIS — F0390 Unspecified dementia without behavioral disturbance: Secondary | ICD-10-CM

## 2019-02-28 DIAGNOSIS — A530 Latent syphilis, unspecified as early or late: Secondary | ICD-10-CM

## 2019-02-28 DIAGNOSIS — R768 Other specified abnormal immunological findings in serum: Secondary | ICD-10-CM

## 2019-02-28 LAB — BASIC METABOLIC PANEL
Anion gap: 10 (ref 5–15)
BUN: 22 mg/dL (ref 8–23)
CO2: 21 mmol/L — ABNORMAL LOW (ref 22–32)
Calcium: 8.5 mg/dL — ABNORMAL LOW (ref 8.9–10.3)
Chloride: 107 mmol/L (ref 98–111)
Creatinine, Ser: 0.53 mg/dL — ABNORMAL LOW (ref 0.61–1.24)
GFR calc Af Amer: >60 mL/min (ref >60)
GFR calc non Af Amer: >60 mL/min (ref >60)
Glucose, Bld: 123 mg/dL — ABNORMAL HIGH (ref 70–99)
Potassium: 3.9 mmol/L (ref 3.5–5.1)
Sodium: 138 mmol/L (ref 135–145)

## 2019-02-28 LAB — CBC
HCT: 41 % (ref 39.0–52.0)
Hemoglobin: 13.1 g/dL (ref 13.0–17.0)
MCH: 30.5 pg (ref 26.0–34.0)
MCHC: 32 g/dL (ref 30.0–36.0)
MCV: 95.6 fL (ref 80.0–100.0)
RBC: 4.29 MIL/uL (ref 4.22–5.81)
RDW: 14 % (ref 11.5–15.5)
WBC: 8.8 10*3/uL (ref 4.0–10.5)
nRBC: 0 % (ref 0.0–0.2)

## 2019-02-28 LAB — C-REACTIVE PROTEIN: CRP: 4.6 mg/dL — ABNORMAL HIGH (ref <1.0)

## 2019-02-28 LAB — MAGNESIUM: Magnesium: 2.5 mg/dL — ABNORMAL HIGH (ref 1.7–2.4)

## 2019-02-28 LAB — CEA: CEA: 3.9 ng/mL (ref 0.0–4.7)

## 2019-02-28 LAB — D-DIMER, QUANTITATIVE: D-Dimer, Quant: 1.85 ug/mL-FEU — ABNORMAL HIGH (ref 0.00–0.50)

## 2019-02-28 MED ORDER — MIDAZOLAM HCL 2 MG/2ML IJ SOLN
INTRAMUSCULAR | Status: AC
  Filled 2019-02-28: qty 2

## 2019-02-28 MED ORDER — FENTANYL CITRATE (PF) 100 MCG/2ML IJ SOLN
INTRAMUSCULAR | Status: AC
  Filled 2019-02-28: qty 2

## 2019-02-28 MED ORDER — LIDOCAINE HCL 1 % IJ SOLN
INTRAMUSCULAR | Status: AC
  Filled 2019-02-28: qty 20

## 2019-02-28 NOTE — Consult Note (Signed)
[name] for Infectious Disease       Reason for Consult: positive RPR    Referring Physician: Dr. Cyndia Skeeters  Principal Problem:   Pneumonia due to COVID-19 virus Active Problems:   Dyslipidemia   Glaucoma   Essential hypertension   Benign prostatic hyperplasia with urinary obstruction   Hearing loss   Weakness   Hypokalemia   Lab test positive for detection of COVID-19 virus   Cerebral atrophy (HCC)   Paranasal sinus disease   Retroperitoneal lymphadenopathy   Splenic mass   Advanced dementia ([place])   Delirium due to another medical condition, acute, hyperactive   Loss of appetite   Hyperlipidemia   GERD (gastroesophageal reflux disease)   HIV test positive ([place])   . vitamin C  500 mg Oral Daily  . aspirin EC  81 mg Oral Daily  . atorvastatin  5 mg Oral Daily  . brinzolamide  1 drop Both Eyes TID  . dexamethasone (DECADRON) injection  6 mg Intravenous Q24H  . donepezil  10 mg Oral QHS  . feeding supplement (ENSURE ENLIVE)  237 mL Oral BID BM  . latanoprost  1 drop Both Eyes QHS  . lidocaine      . multivitamin with minerals  1 tablet Oral Daily  . polyethylene glycol  17 g Oral Daily  . QUEtiapine  25 mg Oral QHS  . senna-docusate  1 tablet Oral BID  . zinc sulfate  220 mg Oral Daily    Recommendations: LP for CSF cell count, glucose, protein and VDRL  If negative for neurosyphilis, will get Bicillin IM 2.4 million units x 3 doses 1 week apart each  Assessment: He has altered mental status and a positive RPR with no history of known or treated syphilis.  This is concerning for neurosyphilis with his recent decline.     Antibiotics: none  HPI: [name] is a 86 y.o. male with advanced dementia at baseline brought in 12/31 with a complaint of weeks of progressive weakness, poor po, weight loss.  He is positive for COVID-19.  He was hypoxic on admission but has remained afebrile and no significant leukocytosis.  He is unable to provide a history due  to his baseline dementia.  HIV screen was positive with a negative confirmatory test. He did have bilateral airspace disease on CT (abdomen) and started on steroids.  Also noted new retroperitoneal lymphadenopathy and underwent biopsy today with concern for lymphoma.  RPR sent on admission with progressive symptoms and is positive at 1:1 with confirmatory positive Ab c/w old disease.  The health department does not have a record of any previous test or treatment.    Review of Systems:  Unable to be assessed due to mental status  Past Medical History:  Diagnosis Date  . Allergic rhinitis   . BPH (benign prostatic hypertrophy) with urinary obstruction 2000   40gm, L lobe 66mm prostate nodule; followed yearly by Dr Diona Fanti  . GERD (gastroesophageal reflux disease)   . Glaucoma   . Herpes zoster ophthalmicus    history  . History of kidney stones remote   x 1-Ca monhydrate, hyperoxalate urine s/p lithotripsy  . HLD (hyperlipidemia)   . HTN (hypertension)   . Impaired glucose tolerance   . Impaired hearing    uses hearing aides bilaterally  . Osteopenia     Social History   Tobacco Use  . Smoking status: Former Smoker    Types: Cigarettes  . Smokeless tobacco: Never Used  Substance Use Topics  . Alcohol use: No  . Drug use: No    Family History  Problem Relation Age of Onset  . Coronary artery disease Father   . Healthy Mother   . Diabetes Brother   . Breast cancer Sister   . Leukemia Maternal Uncle   . Pancreatic cancer Sister     No Known Allergies  Physical Exam: Constitutional: in no apparent distress  Vitals:   02/28/19 1241 02/28/19 1600  BP: 131/69 117/72  Pulse: 70 76  Resp: 16 17  Temp: (!) 97.5 F (36.4 C)   SpO2: 96% 100%   EYES: anicteric Cardiovascular: Cor RRR Respiratory: CTA B; normal respiratory effort GI: soft Musculoskeletal: no edema Skin: negatives: no rash Neuro: non focal; does not answer appropriately (exacerbated by HEPA filter  noise)  Lab Results  Component Value Date   WBC 8.8 02/28/2019   HGB 13.1 02/28/2019   HCT 41.0 02/28/2019   MCV 95.6 02/28/2019   PLT  02/28/2019    PLATELET CLUMPING, SUGGEST RECOLLECTION OF SAMPLE IN CITRATE TUBE.    Lab Results  Component Value Date   CREATININE 0.53 (L) 02/28/2019   BUN 22 02/28/2019   NA 138 02/28/2019   K 3.9 02/28/2019   CL 107 02/28/2019   CO2 21 (L) 02/28/2019    Lab Results  Component Value Date   ALT 39 02/26/2019   AST 74 (H) 02/26/2019   ALKPHOS 63 02/26/2019     Microbiology: Recent Results (from the past 240 hour(s))  Urine culture     Status: Abnormal   Collection Time: 02/22/19 12:26 PM   Specimen: Urine, Clean Catch  Result Value Ref Range Status   Specimen Description   Final    URINE, CLEAN CATCH Performed at [facility], [place] [address], [place], [place] [address]    Special Requests   Final    NONE Performed at [facility], [place] [address], [place], [place] [address]    Culture MULTIPLE SPECIES PRESENT, SUGGEST RECOLLECTION (A)  Final   Report Status 02/23/2019 FINAL  Final  SARS CORONAVIRUS 2 (TAT 6-24 HRS) Nasopharyngeal Nasopharyngeal Swab     Status: Abnormal   Collection Time: 02/22/19  5:06 PM   Specimen: Nasopharyngeal Swab  Result Value Ref Range Status   SARS Coronavirus 2 POSITIVE (A) NEGATIVE Final    Comment: RESULT CALLED TO, READ BACK BY AND VERIFIED WITH: B. COLES,RN [id number] 02/22/2019 T. TYSOR (NOTE) SARS-CoV-2 target nucleic acids are DETECTED. The SARS-CoV-2 RNA is generally detectable in upper and lower respiratory specimens during the acute phase of infection. Positive results are indicative of the presence of SARS-CoV-2 RNA. Clinical correlation with patient history and other diagnostic information is  necessary to determine patient infection status. Positive results do not rule out bacterial infection or co-infection with other viruses.  The expected result is  Negative. Fact Sheet for Patients: SugarRoll.be Fact Sheet for Healthcare Providers: https://www.woods-mathews.com/ This test is not yet approved or cleared by the [place] FDA and  has been authorized for detection and/or diagnosis of SARS-CoV-2 by FDA under an Emergency Use Authorization (EUA). This EUA will remain  in effect (meaning this test can be used) for th e duration of the COVID-19 declaration under Section 564(b)(1) of the Act, 21 U.S.C. section 360bbb-3(b)(1), unless the authorization is terminated or revoked sooner. Performed at [facility], [place] [address], [place], [place] [address]     Marcelene Weidemann W Gaelen Brager, MD [facility] [email or web address] 02/28/2019, 5:10 PM

## 2019-02-28 NOTE — Progress Notes (Signed)
PROGRESS NOTE  [name] [id number] DOB: 1928-05-05   PCP: [name], MD  Patient is from: Home  DOA: 02/22/2019 LOS: 6  Brief Narrative / Interim history:  86 y.o. male with a PMH of advanced dementia, hypertension, hyperlipidemia, GERD and BPH who was admitted 02/22/2019 with a chief complaint of generalized weakness progressive over several weeks.  In the ED, he had low-grade temperature but was noted to be hypoxic with a room air saturation of 84% associated with tachycardia.  Chest x-ray was clear and CT was negative for acute infarct.  CT of the abdomen and pelvis showed a splenic mass and retroperitoneal adenopathy along with patchy bilateral airspace disease consistent with viral pneumonia.  COVID-19 testing was positive as was RPR and HIV testing.  T. Pallidum positive. However, RPR titer 1:1. Unclear if this was treated or untreated.  Confirmatory HIV test negative.  Infectious disease, IR and oncology consulted.  Subjective: No major events overnight of this morning.  Remains somnolent.  Only moans to noxious stimuli.  Does not appear to be in distress.  On room air.  Objective: Vitals:   02/27/19 1100 02/27/19 1200 02/27/19 1300 02/28/19 0520  BP: (!) 82/64 91/64 121/82 115/80  Pulse: 73 72 67 72  Resp: 16 14 14 18   Temp: 98.1 F (36.7 C) 98.2 F (36.8 C) 98 F (36.7 C) 98.5 F (36.9 C)  TempSrc: Oral Oral Oral   SpO2: 97% 96% 96% 97%  Weight:      Height:        Intake/Output Summary (Last 24 hours) at 02/28/2019 1048 Last data filed at 02/28/2019 0600 Gross per 24 hour  Intake 1125 ml  Output --  Net 1125 ml   Filed Weights   02/22/19 2056  Weight: 71.5 kg    Examination:  GENERAL: Chronically ill-appearing.  Frail.  No distress. HEENT: MMM.  Vision and hearing grossly intact.  NECK: Supple.  No apparent JVD.  RESP:  No IWOB.  Fair aeration bilaterally. CVS:  RRR. Heart sounds normal.  ABD/GI/GU: Bowel sounds present. Soft. Non tender.   MSK/EXT:  Moves extremities. No apparent deformity. No edema.  SKIN: no apparent skin lesion or wound NEURO: Somnolent.  Mumbles and withdraws to noxious stimuli.  Moves all extremities. PSYCH: Somnolent and calm.  Procedures:  None  Assessment & Plan: Pneumonia due to COVID-19 infection: CT chest with bibasilar groundglass opacities.  Inflammatory markers slightly elevated and trending up. Recent Labs    02/26/19 0340 02/27/19 0319 02/28/19 0311  DDIMER 0.79* 1.02* 1.85*  CRP 1.1* 2.6* 4.6*  -Continue remdesivir and Decadron 1/1>> -Subcu Lovenox for VTE prophylaxis-on hold for LP and IR procedure -Continue supportive care -Trend inflammatory markers  Acute metabolic encephalopathy in patient with advanced dementia: could be due to COVID-19 infection and delirium.  Also concern for tertiary syphilis.  RPR and T. Pallidum positive although low titer. Unclear if this was treated or not.  -Continue Seroquel at bedtime  -Haldol as needed for agitation-has not been receiving. -Delirium precautions -DG fluoroscopy guided lumbar puncture with labs.  Possible neurosyphilis: T. Pallidum positive but RPR titer low. Unclear if this is treated or not. -ID consulted. -DG fluoroscopy guided lumbar puncture  Splenic mass/retroperitoneal right healer and left supraclavicular adenopathy -IR consulted for CT-guided biopsy and recommended oncology input. -Appreciate input by oncology.  Hypotension: Normotensive. -Hold Avapro -Increase IV fluid to 100 cc an hour  Glaucoma -Continue home eyedrops.  BPH: Not on medication at home. -Monitor urinary  output.  Goal of care: Patient remains full code per family wish although this would pose more harm than benefit in light of patient's condition.  Palliative care following. -Appreciate palliative input-full code and full scope of care    Moderate protein calorie malnutrition Nutrition Problem: Increased nutrient needs Etiology: acute  illness(COVID-19)  Signs/Symptoms: estimated needs  Interventions: Ensure Enlive (each supplement provides 350kcal and 20 grams of protein), Magic cup, MVI   DVT prophylaxis: Subcu Lovenox Code Status: Full code Family Communication: Discussed with patient's son over the phone 1/5. Disposition Plan: Remains inpatient. Consultants: IR, oncology, palliative care and infectious disease   Microbiology summarized: COVID-19 positive. Urine culture with multiple species.  Sch Meds:  Scheduled Meds: . vitamin C  500 mg Oral Daily  . aspirin EC  81 mg Oral Daily  . atorvastatin  5 mg Oral Daily  . brinzolamide  1 drop Both Eyes TID  . dexamethasone (DECADRON) injection  6 mg Intravenous Q24H  . donepezil  10 mg Oral QHS  . feeding supplement (ENSURE ENLIVE)  237 mL Oral BID BM  . latanoprost  1 drop Both Eyes QHS  . multivitamin with minerals  1 tablet Oral Daily  . polyethylene glycol  17 g Oral Daily  . QUEtiapine  25 mg Oral QHS  . senna-docusate  1 tablet Oral BID  . zinc sulfate  220 mg Oral Daily   Continuous Infusions: . lactated ringers 100 mL/hr at 02/27/19 2251   PRN Meds:.haloperidol lactate  Antimicrobials: Anti-infectives (From admission, onward)   Start     Dose/Rate Route Frequency Ordered Stop   02/24/19 1000  remdesivir 100 mg in sodium chloride 0.9 % 100 mL IVPB     100 mg 200 mL/hr over 30 Minutes Intravenous Daily 02/23/19 1519 02/27/19 1120   02/23/19 1600  cefTRIAXone (ROCEPHIN) 1 g in sodium chloride 0.9 % 100 mL IVPB  Status:  Discontinued     1 g 200 mL/hr over 30 Minutes Intravenous Every 24 hours 02/22/19 1751 02/23/19 0818   02/23/19 1600  remdesivir 200 mg in sodium chloride 0.9% 250 mL IVPB     200 mg 580 mL/hr over 30 Minutes Intravenous Once 02/23/19 1519 02/23/19 1624   02/23/19 1500  azithromycin (ZITHROMAX) 500 mg in sodium chloride 0.9 % 250 mL IVPB  Status:  Discontinued     500 mg 250 mL/hr over 60 Minutes Intravenous Every 24 hours  02/22/19 1751 02/23/19 0818   02/22/19 1630  cefTRIAXone (ROCEPHIN) 1 g in sodium chloride 0.9 % 100 mL IVPB     1 g 200 mL/hr over 30 Minutes Intravenous  Once 02/22/19 1615 02/22/19 1729   02/22/19 1630  azithromycin (ZITHROMAX) 500 mg in sodium chloride 0.9 % 250 mL IVPB     500 mg 250 mL/hr over 60 Minutes Intravenous  Once 02/22/19 1615 02/22/19 1902       I have personally reviewed the following labs and images: CBC: Recent Labs  Lab 02/23/19 0322 02/24/19 0221 02/26/19 0340 02/27/19 0319 02/28/19 0311  WBC 6.1 4.5 14.2* 9.0 8.8  NEUTROABS  --   --  11.1*  --   --   HGB 13.4 13.5 13.7 12.5* 13.1  HCT 39.8 41.6 41.2 38.2* 41.0  MCV 93.6 94.5 92.6 92.9 95.6  PLT 170 197 PLATELET CLUMPS NOTED ON SMEAR, UNABLE TO ESTIMATE PLATELET CLUMPING, SUGGEST RECOLLECTION OF SAMPLE IN CITRATE TUBE. PLATELET CLUMPING, SUGGEST RECOLLECTION OF SAMPLE IN CITRATE TUBE.   BMP &GFR Recent Labs  Lab 02/23/19 0322 02/24/19 0221 02/26/19 0340 02/27/19 0319 02/28/19 0311  NA 136 140 137 138 138  K 3.1* 3.9 3.6 3.7 3.9  CL 101 105 106 105 107  CO2 25 23 21* 21* 21*  GLUCOSE 107* 125* 98 83 123*  BUN 14 12 24* 27* 22  CREATININE 0.58* 0.47* 0.66 0.68 0.53*  CALCIUM 8.5* 8.7* 8.7* 8.4* 8.5*  MG  --   --  2.2 2.3 2.5*   Estimated Creatinine Clearance: 57.6 mL/min (A) (by C-G formula based on SCr of 0.53 mg/dL (L)). Liver & Pancreas: Recent Labs  Lab 02/22/19 1226 02/26/19 0340  AST 43* 74*  ALT 25 39  ALKPHOS 70 63  BILITOT 0.7 0.6  PROT 6.8 6.2*  ALBUMIN 3.2* 2.9*   Recent Labs  Lab 02/22/19 1226  LIPASE 33   No results for input(s): AMMONIA in the last 168 hours. Diabetic: No results for input(s): HGBA1C in the last 72 hours. No results for input(s): GLUCAP in the last 168 hours. Cardiac Enzymes: No results for input(s): CKTOTAL, CKMB, CKMBINDEX, TROPONINI in the last 168 hours. No results for input(s): PROBNP in the last 8760 hours. Coagulation Profile: Recent Labs    Lab 02/26/19 0340 02/27/19 0319  INR 1.2 1.2   Thyroid Function Tests: No results for input(s): TSH, T4TOTAL, FREET4, T3FREE, THYROIDAB in the last 72 hours. Lipid Profile: No results for input(s): CHOL, HDL, LDLCALC, TRIG, CHOLHDL, LDLDIRECT in the last 72 hours. Anemia Panel: No results for input(s): VITAMINB12, FOLATE, FERRITIN, TIBC, IRON, RETICCTPCT in the last 72 hours. Urine analysis:    Component Value Date/Time   COLORURINE YELLOW 02/22/2019 1226   APPEARANCEUR CLEAR 02/22/2019 1226   LABSPEC 1.019 02/22/2019 1226   PHURINE 7.0 02/22/2019 1226   GLUCOSEU NEGATIVE 02/22/2019 1226   HGBUR NEGATIVE 02/22/2019 1226   BILIRUBINUR NEGATIVE 02/22/2019 1226   BILIRUBINUR negative 02/05/2019 1610   KETONESUR NEGATIVE 02/22/2019 1226   PROTEINUR NEGATIVE 02/22/2019 1226   UROBILINOGEN 0.2 02/05/2019 1610   UROBILINOGEN 0.2 03/21/2007 0944   NITRITE NEGATIVE 02/22/2019 1226   LEUKOCYTESUR NEGATIVE 02/22/2019 1226   Sepsis Labs: Invalid input(s): PROCALCITONIN, [place]  Microbiology: Recent Results (from the past 240 hour(s))  Urine culture     Status: Abnormal   Collection Time: 02/22/19 12:26 PM   Specimen: Urine, Clean Catch  Result Value Ref Range Status   Specimen Description   Final    URINE, CLEAN CATCH Performed at [facility], [place] [address], [place], [place] [address]    Special Requests   Final    NONE Performed at [facility], [place] [address] [place], [place] [address]    Culture MULTIPLE SPECIES PRESENT, SUGGEST RECOLLECTION (A)  Final   Report Status 02/23/2019 FINAL  Final  SARS CORONAVIRUS 2 (TAT 6-24 HRS) Nasopharyngeal Nasopharyngeal Swab     Status: Abnormal   Collection Time: 02/22/19  5:06 PM   Specimen: Nasopharyngeal Swab  Result Value Ref Range Status   SARS Coronavirus 2 POSITIVE (A) NEGATIVE Final    Comment: RESULT CALLED TO, READ BACK BY AND VERIFIED WITH: B. COLES,RN 2343 02/22/2019 T.  TYSOR (NOTE) SARS-CoV-2 target nucleic acids are DETECTED. The SARS-CoV-2 RNA is generally detectable in upper and lower respiratory specimens during the acute phase of infection. Positive results are indicative of the presence of SARS-CoV-2 RNA. Clinical correlation with patient history and other diagnostic information is  necessary to determine patient infection status. Positive results do not rule out bacterial  infection or co-infection with other viruses.  The expected result is Negative. Fact Sheet for Patients: SugarRoll.be Fact Sheet for Healthcare Providers: https://www.woods-mathews.com/ This test is not yet approved or cleared by the [place] FDA and  has been authorized for detection and/or diagnosis of SARS-CoV-2 by FDA under an Emergency Use Authorization (EUA). This EUA will remain  in effect (meaning this test can be used) for th e duration of the COVID-19 declaration under Section 564(b)(1) of the Act, 21 U.S.C. section 360bbb-3(b)(1), unless the authorization is terminated or revoked sooner. Performed at [facility], [place] [address] [place], [place] [address]     Radiology Studies: No results found.   [facility]  If 7PM-7AM, please contact night-coverage [email or web address] Password [facility] 02/28/2019, 10:48 AM

## 2019-02-28 NOTE — Telephone Encounter (Signed)
Agree with this. Thanks.  

## 2019-02-28 NOTE — Telephone Encounter (Signed)
Spoke with son, reviewed recent records.  Planned LP tomorrow for + RPR, Treponema Ab ?neurosyphillis.  He had successful LN biopsy today. [name] wife is currently being discharged home after receiving Remdesivir treatment.

## 2019-02-28 NOTE — Progress Notes (Signed)
Paged by Dr. Jobe Igo about LP. Dr. Jobe Igo suggesting trying at bedside first but I have no one to perform this. He suggesting prone position for about 8-10 minutes to see if patient tolerates this position for LP under floro.  Called and advised patient's RN to attempt this with supervision given his somnolence. RN to let me know if patient tolerates this position.

## 2019-02-28 NOTE — Telephone Encounter (Signed)
[facility] Night - Client TELEPHONE ADVICE RECORD [facility] Patient Name: [name] Gender: Male DOB: 07-07-1928 Age: 86 Y 27 M 19 D Return Phone Number: [phone] (Primary) Address: City/State/Zip: [place] [address] Client [facility] Night - Client Client Site [place] Physician [name] - MD Contact Type Call Who Is Calling Patient / Member / Family / Caregiver Call Type Triage / Clinical Caller Name [name] Relationship To Patient Son Return Phone Number [phone] (Primary) Chief Complaint Paging or Request for Consult Reason for Call Request to Speak to a Physician Initial Comment Caller states that his parents are in the hospital with covid-19 and he would like to talk to Dr. Danise Mina regarding his father. Translation No Nurse Assessment Nurse: Joya Gaskins, RN, Vonna Kotyk Date/Time (Eastern Time): 02/27/2019 7:08:01 PM Confirm and document reason for call. If symptomatic, describe symptoms. ---Caller states that his parents are in the hospital with covid-19 and he would like to talk to Dr. Danise Mina regarding his father. Has the patient had close contact with a person known or suspected to have the novel coronavirus illness OR traveled / lives in area with major community spread (including international travel) in the last 14 days from the onset of symptoms? * If Asymptomatic, screen for exposure and travel within the last 14 days. ---No Does the patient have any new or worsening symptoms? ---No Please document clinical information provided and list any resource used. ---RN informed caller that Dr. Danise Mina is not on call; advised that the caller contact the office tomorrow to speak with him then. Caller is having no other issues at this time, just wants direction in regards to plan of care with father. Guidelines Guideline Title Affirmed Question Affirmed Notes Nurse Date/Time  (Eastern Time) Disp. Time Eilene Ghazi Time) Disposition Final User 02/27/2019 7:10:18 PM Clinical Call Yes Joya Gaskins, RN, Vonna Kotyk

## 2019-02-28 NOTE — Procedures (Signed)
Interventional Radiology Procedure Note  Procedure: US guided left supra-clavicular/cervical node biopsy. Mx 18g core biopsy.  Complications: None Recommendations:  - follow up path - Do not submerge for 7 days - Routine care   Signed,  Dulcy Fanny. Earleen Newport, DO

## 2019-02-28 NOTE — Progress Notes (Signed)
SLP Cancellation Note  Patient Details Name: AQUILLA GODBEE MRN: [id number] DOB: [birth year]   Cancelled treatment:       Reason Eval/Treat Not Completed: Other (comment)(pt npo for procedure)   Macario Golds 02/28/2019, 7:22 AM  Kathleen Lime, MS [place] Office [phone]

## 2019-02-28 NOTE — Telephone Encounter (Signed)
Levada Dy with [facility] called asking for verbal order for nursing services to start for 1 x a week for 4 weeks and then once every other week. This will most likely change when patient comes out of the hospital but Levada Dy needs orders for the first visit at least. Her CB is [phone]

## 2019-03-01 ENCOUNTER — Inpatient Hospital Stay (HOSPITAL_COMMUNITY): Payer: Medicare Other

## 2019-03-01 LAB — COMPREHENSIVE METABOLIC PANEL
ALT: 43 U/L (ref 0–44)
AST: 54 U/L — ABNORMAL HIGH (ref 15–41)
Albumin: 2.5 g/dL — ABNORMAL LOW (ref 3.5–5.0)
Alkaline Phosphatase: 64 U/L (ref 38–126)
Anion gap: 9 (ref 5–15)
BUN: 32 mg/dL — ABNORMAL HIGH (ref 8–23)
CO2: 22 mmol/L (ref 22–32)
Calcium: 8.5 mg/dL — ABNORMAL LOW (ref 8.9–10.3)
Chloride: 105 mmol/L (ref 98–111)
Creatinine, Ser: 0.56 mg/dL — ABNORMAL LOW (ref 0.61–1.24)
GFR calc Af Amer: >60 mL/min (ref >60)
GFR calc non Af Amer: >60 mL/min (ref >60)
Glucose, Bld: 114 mg/dL — ABNORMAL HIGH (ref 70–99)
Potassium: 3.7 mmol/L (ref 3.5–5.1)
Sodium: 136 mmol/L (ref 135–145)
Total Bilirubin: 1 mg/dL (ref 0.3–1.2)
Total Protein: 5.7 g/dL — ABNORMAL LOW (ref 6.5–8.1)

## 2019-03-01 LAB — CBC
HCT: 40.4 % (ref 39.0–52.0)
Hemoglobin: 13.3 g/dL (ref 13.0–17.0)
MCH: 30.6 pg (ref 26.0–34.0)
MCHC: 32.9 g/dL (ref 30.0–36.0)
MCV: 93.1 fL (ref 80.0–100.0)
Platelets: 153 10*3/uL (ref 150–400)
RBC: 4.34 MIL/uL (ref 4.22–5.81)
RDW: 13.7 % (ref 11.5–15.5)
WBC: 11.5 10*3/uL — ABNORMAL HIGH (ref 4.0–10.5)
nRBC: 0 % (ref 0.0–0.2)

## 2019-03-01 LAB — CSF CELL COUNT WITH DIFFERENTIAL
RBC Count, CSF: 4 /mm3 — ABNORMAL HIGH
Tube #: 4
WBC, CSF: 0 /mm3 (ref 0–5)

## 2019-03-01 LAB — D-DIMER, QUANTITATIVE: D-Dimer, Quant: 1.86 ug/mL-FEU — ABNORMAL HIGH (ref 0.00–0.50)

## 2019-03-01 LAB — MAGNESIUM: Magnesium: 2.5 mg/dL — ABNORMAL HIGH (ref 1.7–2.4)

## 2019-03-01 LAB — GLUCOSE, CSF: Glucose, CSF: 73 mg/dL — ABNORMAL HIGH (ref 40–70)

## 2019-03-01 LAB — PROTIME-INR
INR: 1.2 (ref 0.8–1.2)
Prothrombin Time: 15 seconds (ref 11.4–15.2)

## 2019-03-01 LAB — C-REACTIVE PROTEIN: CRP: 1.8 mg/dL — ABNORMAL HIGH (ref <1.0)

## 2019-03-01 LAB — AMMONIA: Ammonia: 36 umol/L — ABNORMAL HIGH (ref 9–35)

## 2019-03-01 LAB — APTT: aPTT: 31 seconds (ref 24–36)

## 2019-03-01 LAB — PROTEIN, CSF: Total  Protein, CSF: 100 mg/dL — ABNORMAL HIGH (ref 15–45)

## 2019-03-01 MED ORDER — LIDOCAINE HCL 1 % IJ SOLN
INTRAMUSCULAR | Status: AC
  Filled 2019-03-01: qty 20

## 2019-03-01 NOTE — Progress Notes (Signed)
New ID concerns noted. Biopsy results from 01/06 biopsy likely will be ready 01/11. I will review with pathology that day and once we have a definitive reading will meet with patient and family to operationalize oncology plan. Please let my partners know if we can be of help before then  GM

## 2019-03-01 NOTE — Progress Notes (Signed)
Physical Therapy Treatment Patient Details Name: [name] MRN: [id number] DOB: 27-Aug-1928 Today's Date: 03/01/2019    History of Present Illness Pt admitted from home Covid + and with hx of dementia    PT Comments    Patient making gradual progress with therapy. He continues to require 2+ mod-max assist for bed mobility and sit<>stand transfers. Pt on RA throughout session and maintained SpO2 around 98-100% with exception of gripping the RW and sats dropping to 83-88% but returning to high 90's within 15 seconds of sitting. Pt performed ~ 5 sit<>stands total and required increased assist for last 2 reps, he was limited by LE fatigue and unable to perform side stepping or gait this session. Acute PT will continue to follow and progress as able.   Follow Up Recommendations  SNF     Equipment Recommendations  None recommended by PT    Recommendations for Other Services       Precautions / Restrictions Precautions Precautions: Fall Restrictions Weight Bearing Restrictions: No    Mobility  Bed Mobility Overal bed mobility: Needs Assistance Bed Mobility: Supine to Sit;Sit to Supine     Supine to sit: +2 for physical assistance;+2 for safety/equipment;HOB elevated;Max assist Sit to supine: +2 for safety/equipment;+2 for physical assistance;HOB elevated;Max assist   General bed mobility comments: 2+ assist for bed mob, max assist for LE mobility to bring to EOB and to assist with raising trunk upright. bedpad used to assis with anterior scoot. max assist +2 to lower trunk and pivot LE's up/into bed.  Transfers Overall transfer level: Needs assistance Equipment used: Rolling walker (2 wheeled) Transfers: Sit to/from Stand Sit to Stand: Mod assist;+2 physical assistance;+2 safety/equipment         General transfer comment: Verbal cues for forward trunk lean to initiate rise, mod assist +2 to initiate power up and rise to stand on first 3 sit<>stands. Pt maintained standing  for ~ 4 minutes for BM and additional ~2-3 minutes for cleansing. Pt requried increaed Max assist on last 2 sit<>stands and was unable to sequence side stepping at EOB.  Ambulation/Gait                 Stairs             Wheelchair Mobility    Modified Rankin (Stroke Patients Only)       Balance Overall balance assessment: Needs assistance Sitting-balance support: No upper extremity supported;Feet supported Sitting balance-Leahy Scale: Fair     Standing balance support: Bilateral upper extremity supported Standing balance-Leahy Scale: Poor Standing balance comment: dependent on external assist from staff           Cognition Arousal/Alertness: Awake/alert Behavior During Therapy: WFL for tasks assessed/performed Overall Cognitive Status: History of cognitive impairments - at baseline          General Comments: able to follow multimodal commands      Exercises      General Comments        Pertinent Vitals/Pain Pain Assessment: Faces Faces Pain Scale: No hurt           PT Goals (current goals can now be found in the care plan section) Acute Rehab PT Goals Patient Stated Goal: No goals stated PT Goal Formulation: Patient unable to participate in goal setting Time For Goal Achievement: 03/09/19 Potential to Achieve Goals: Fair Progress towards PT goals: Progressing toward goals    Frequency    Min 2X/week      PT Plan Current plan  remains appropriate    Co-evaluation              AM-PAC PT "6 Clicks" Mobility   Outcome Measure  Help needed turning from your back to your side while in a flat bed without using bedrails?: A Lot Help needed moving from lying on your back to sitting on the side of a flat bed without using bedrails?: A Lot Help needed moving to and from a bed to a chair (including a wheelchair)?: A Lot Help needed standing up from a chair using your arms (e.g., wheelchair or bedside chair)?: A Lot Help needed to  walk in hospital room?: Total Help needed climbing 3-5 steps with a railing? : Total 6 Click Score: 10    End of Session Equipment Utilized During Treatment: Gait belt Activity Tolerance: Patient tolerated treatment well Patient left: in bed;with call bell/phone within reach;with bed alarm set(telesitter in room) Nurse Communication: Mobility status PT Visit Diagnosis: Muscle weakness (generalized) (M62.81);Difficulty in walking, not elsewhere classified (R26.2)     Time: [id number] PT Time Calculation (min) (ACUTE ONLY): 28 min  Charges:  $Therapeutic Activity: 23-37 mins                     Verner Mould, DPT Physical Therapist with [facility] [phone]  03/01/2019 3:14 PM

## 2019-03-01 NOTE — Progress Notes (Signed)
PROGRESS NOTE  ROOK SOULE [id number] DOB: [birth year]   PCP: [name], MD  Patient is from: Home  DOA: 02/22/2019 LOS: 7  Brief Narrative / Interim history:  86 y.o. male with a PMH of advanced dementia, hypertension, hyperlipidemia, GERD and BPH who was admitted 02/22/2019 with a chief complaint of generalized weakness progressive over several weeks.  In the ED, he had low-grade temperature but was noted to be hypoxic with a room air saturation of 84% associated with tachycardia.  Chest x-ray was clear and CT was negative for acute infarct.  CT of the abdomen and pelvis showed a splenic mass and retroperitoneal adenopathy along with patchy bilateral airspace disease consistent with viral pneumonia.  COVID-19 testing was positive as was RPR and HIV testing.  T. Pallidum positive. However, RPR titer 1:1. Unclear if this was treated or untreated.  Confirmatory HIV test negative.  Infectious disease, IR and oncology consulted.   Patient had biopsy of supraclavicular lymph nodes on 02/27/2018.  Pathology pending.  ID recommended LP to exclude tertiary syphilis.   Subjective: No major events overnight of this morning.  He is awake but disoriented.  Does not follows command.  Does not appear to be in distress.  Objective: Vitals:   02/28/19 2011 02/28/19 2118 02/28/19 2327 03/01/19 0458  BP: 119/64 126/67  (!) 107/51  Pulse: (!) 102 (!) 102 (!) 101 (!) 58  Resp:    20  Temp: 97.7 F (36.5 C)   98.1 F (36.7 C)  TempSrc: Oral     SpO2: 98% 95% 96% 95%  Weight:      Height:        Intake/Output Summary (Last 24 hours) at 03/01/2019 1042 Last data filed at 03/01/2019 0900 Gross per 24 hour  Intake 602.52 ml  Output --  Net 602.52 ml   Filed Weights   02/22/19 2056  Weight: 71.5 kg    Examination:  GENERAL: Frail and chronically ill-appearing.  No distress. HEENT: MMM.  Vision and hearing grossly intact.  NECK: Supple.  No apparent JVD.  RESP:  No IWOB. Good air  movement bilaterally. CVS:  RRR. Heart sounds normal.  ABD/GI/GU: Bowel sounds present. Soft. Non tender.  MSK/EXT:  Moves extremities. No apparent deformity. No edema.  SKIN: no apparent skin lesion or wound NEURO: Awake, alert but disoriented.  Moves all extremities.  No apparent focal neuro deficit. PSYCH: Calm but disoriented.  Procedures:  None  Assessment & Plan: Pneumonia due to COVID-19 infection: CT chest with bibasilar groundglass opacities.  Inflammatory markers started to improve.  On room air. 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 RPR and T. Pallidum positive although low titer. Unclear if this was treated or not.  COVID-19 and acute hospitalization could contribute. -Continue low-dose Seroquel at bedtime  -Haldol as needed for agitation-has not been receiving. -Delirium precautions -DG fluoroscopy guided lumbar puncture with labs.  Possible neurosyphilis: T. Pallidum positive but RPR titer low. Unclear if this is treated or not. -Appreciate ID input. -DG fluoroscopy guided lumbar puncture  Splenic mass/retroperitoneal right healer and left supraclavicular adenopathy -Had a biopsy of supraclavicular lymph node on 1/6.  Pathology pending. -Appreciate input by oncology.  Hypotension: Normotensive. -Hold Avapro -Continue LR at 75 cc  an hour.  Glaucoma -Continue home eyedrops.  BPH: Not on medication at home. -Monitor urinary output.  Goal of care: Patient remains full code per family wish although this would pose more harm than benefit in light of patient's condition.  Palliative care following. -Appreciate palliative input-full code and full scope of care    Moderate  protein calorie malnutrition Nutrition Problem: Increased nutrient needs Etiology: acute illness(COVID-19)  Signs/Symptoms: estimated needs  Interventions: Ensure Enlive (each supplement provides 350kcal and 20 grams of protein), Magic cup, MVI   DVT prophylaxis: Subcu Lovenox Code Status: Full code Family Communication: Discussed with patient's son over the phone 1/6. Disposition Plan: Remains inpatient due to encephalopathy and further work-up and treatment Consultants: IR, oncology, palliative care and infectious disease   Microbiology summarized: COVID-19 positive. Urine culture with multiple species.  Sch Meds:  Scheduled Meds: . vitamin C  500 mg Oral Daily  . aspirin EC  81 mg Oral Daily  . atorvastatin  5 mg Oral Daily  . brinzolamide  1 drop Both Eyes TID  . dexamethasone (DECADRON) injection  6 mg Intravenous Q24H  . donepezil  10 mg Oral QHS  . feeding supplement (ENSURE ENLIVE)  237 mL Oral BID BM  . latanoprost  1 drop Both Eyes QHS  . multivitamin with minerals  1 tablet Oral Daily  . polyethylene glycol  17 g Oral Daily  . QUEtiapine  25 mg Oral QHS  . senna-docusate  1 tablet Oral BID  . zinc sulfate  220 mg Oral Daily   Continuous Infusions: . lactated ringers 100 mL/hr at 03/01/19 1036   PRN Meds:.haloperidol lactate  Antimicrobials: Anti-infectives (From admission, onward)   Start     Dose/Rate Route Frequency Ordered Stop   02/24/19 1000  remdesivir 100 mg in sodium chloride 0.9 % 100 mL IVPB     100 mg 200 mL/hr over 30 Minutes Intravenous Daily 02/23/19 1519 02/27/19 1120   02/23/19 1600  cefTRIAXone (ROCEPHIN) 1 g in sodium chloride 0.9 % 100 mL IVPB  Status:  Discontinued     1 g 200 mL/hr over 30 Minutes Intravenous Every 24 hours 02/22/19 1751 02/23/19 0818   02/23/19 1600  remdesivir 200 mg in sodium chloride 0.9% 250 mL IVPB     200 mg 580 mL/hr over 30 Minutes Intravenous Once 02/23/19 1519 02/23/19 1624   02/23/19 1500  azithromycin  (ZITHROMAX) 500 mg in sodium chloride 0.9 % 250 mL IVPB  Status:  Discontinued     500 mg 250 mL/hr over 60 Minutes Intravenous Every 24 hours 02/22/19 1751 02/23/19 0818   02/22/19 1630  cefTRIAXone (ROCEPHIN) 1 g in sodium chloride 0.9 % 100 mL IVPB     1 g 200 mL/hr over 30 Minutes Intravenous  Once 02/22/19 1615 02/22/19 1729   02/22/19 1630  azithromycin (ZITHROMAX) 500 mg in sodium chloride 0.9 % 250 mL IVPB     500 mg 250 mL/hr over 60 Minutes Intravenous  Once 02/22/19 1615 02/22/19 1902       I have personally reviewed the following labs and images: CBC: Recent Labs  Lab 02/24/19 0221 02/26/19 0340 02/27/19 0319 02/28/19 0311 03/01/19 0257  WBC 4.5 14.2* 9.0 8.8 11.5*  NEUTROABS  --  11.1*  --   --   --   HGB 13.5 13.7 12.5* 13.1 13.3  HCT 41.6 41.2 38.2* 41.0 40.4  MCV 94.5 92.6 92.9 95.6 93.1  PLT 197 PLATELET CLUMPS NOTED ON SMEAR, UNABLE TO ESTIMATE PLATELET  CLUMPING, SUGGEST RECOLLECTION OF SAMPLE IN CITRATE TUBE. PLATELET CLUMPING, SUGGEST RECOLLECTION OF SAMPLE IN CITRATE TUBE. 153   BMP &GFR Recent Labs  Lab 02/24/19 0221 02/26/19 0340 02/27/19 0319 02/28/19 0311 03/01/19 0257  NA 140 137 138 138 136  K 3.9 3.6 3.7 3.9 3.7  CL 105 106 105 107 105  CO2 23 21* 21* 21* 22  GLUCOSE 125* 98 83 123* 114*  BUN 12 24* 27* 22 32*  CREATININE 0.47* 0.66 0.68 0.53* 0.56*  CALCIUM 8.7* 8.7* 8.4* 8.5* 8.5*  MG  --  2.2 2.3 2.5* 2.5*   Estimated Creatinine Clearance: 57.6 mL/min (A) (by C-G formula based on SCr of 0.56 mg/dL (L)). Liver & Pancreas: Recent Labs  Lab 02/22/19 1226 02/26/19 0340 03/01/19 0257  AST 43* 74* 54*  ALT 25 39 43  ALKPHOS 70 63 64  BILITOT 0.7 0.6 1.0  PROT 6.8 6.2* 5.7*  ALBUMIN 3.2* 2.9* 2.5*   Recent Labs  Lab 02/22/19 1226  LIPASE 33   Recent Labs  Lab 03/01/19 0257  AMMONIA 36*   Diabetic: No results for input(s): HGBA1C in the last 72 hours. No results for input(s): GLUCAP in the last 168 hours. Cardiac  Enzymes: No results for input(s): CKTOTAL, CKMB, CKMBINDEX, TROPONINI in the last 168 hours. No results for input(s): PROBNP in the last 8760 hours. Coagulation Profile: Recent Labs  Lab 02/26/19 0340 02/27/19 0319 03/01/19 0257  INR 1.2 1.2 1.2   Thyroid Function Tests: No results for input(s): TSH, T4TOTAL, FREET4, T3FREE, THYROIDAB in the last 72 hours. Lipid Profile: No results for input(s): CHOL, HDL, LDLCALC, TRIG, CHOLHDL, LDLDIRECT in the last 72 hours. Anemia Panel: No results for input(s): VITAMINB12, FOLATE, FERRITIN, TIBC, IRON, RETICCTPCT in the last 72 hours. Urine analysis:    Component Value Date/Time   COLORURINE YELLOW 02/22/2019 1226   APPEARANCEUR CLEAR 02/22/2019 1226   LABSPEC 1.019 02/22/2019 1226   PHURINE 7.0 02/22/2019 1226   GLUCOSEU NEGATIVE 02/22/2019 1226   HGBUR NEGATIVE 02/22/2019 1226   BILIRUBINUR NEGATIVE 02/22/2019 1226   BILIRUBINUR negative 02/05/2019 1610   KETONESUR NEGATIVE 02/22/2019 1226   PROTEINUR NEGATIVE 02/22/2019 1226   UROBILINOGEN 0.2 02/05/2019 1610   UROBILINOGEN 0.2 03/21/2007 0944   NITRITE NEGATIVE 02/22/2019 1226   LEUKOCYTESUR NEGATIVE 02/22/2019 1226   Sepsis Labs: Invalid input(s): PROCALCITONIN, [place]  Microbiology: Recent Results (from the past 240 hour(s))  Urine culture     Status: Abnormal   Collection Time: 02/22/19 12:26 PM   Specimen: Urine, Clean Catch  Result Value Ref Range Status   Specimen Description   Final    URINE, CLEAN CATCH Performed at [facility], [place] [address], [place], [place] [address]    Special Requests   Final    NONE Performed at [facility], [place] [address], [place], [place] [address]    Culture MULTIPLE SPECIES PRESENT, SUGGEST RECOLLECTION (A)  Final   Report Status 02/23/2019 FINAL  Final  SARS CORONAVIRUS 2 (TAT 6-24 HRS) Nasopharyngeal Nasopharyngeal Swab     Status: Abnormal   Collection Time: 02/22/19  5:06 PM    Specimen: Nasopharyngeal Swab  Result Value Ref Range Status   SARS Coronavirus 2 POSITIVE (A) NEGATIVE Final    Comment: RESULT CALLED TO, READ BACK BY AND VERIFIED WITH: B. COLES,RN 2343 02/22/2019 T. TYSOR (NOTE) SARS-CoV-2 target nucleic acids are DETECTED. The SARS-CoV-2 RNA is generally detectable in upper and lower respiratory specimens during the acute phase of infection.  Positive results are indicative of the presence of SARS-CoV-2 RNA. Clinical correlation with patient history and other diagnostic information is  necessary to determine patient infection status. Positive results do not rule out bacterial infection or co-infection with other viruses.  The expected result is Negative. Fact Sheet for Patients: SugarRoll.be Fact Sheet for Healthcare Providers: https://www.woods-mathews.com/ This test is not yet approved or cleared by the [place] FDA and  has been authorized for detection and/or diagnosis of SARS-CoV-2 by FDA under an Emergency Use Authorization (EUA). This EUA will remain  in effect (meaning this test can be used) for th e duration of the COVID-19 declaration under Section 564(b)(1) of the Act, 21 U.S.C. section 360bbb-3(b)(1), unless the authorization is terminated or revoked sooner. Performed at [facility], [place] [address], [place], [place] [address]     Radiology Studies: [place] CORE BIOPSY (LYMPH NODES)  Result Date: 02/28/2019 INDICATION: 86 year old male with a history supraclavicular adenopathy, possible lymphoma EXAM: ULTRASOUND-GUIDED BIOPSY OF LEFT SUPRACLAVICULAR/CERVICAL NODES MEDICATIONS: None. ANESTHESIA/SEDATION: None FLUOROSCOPY TIME:  None COMPLICATIONS: None PROCEDURE: Informed written consent was obtained from the patient after a thorough discussion of the procedural risks, benefits and alternatives. All questions were addressed. Maximal Sterile Barrier Technique was utilized including caps,  mask, sterile gowns, sterile gloves, sterile drape, hand hygiene and skin antiseptic. A timeout was performed prior to the initiation of the procedure. Patient positioned supine position. Ultrasound survey was performed with images stored sent to PACs. The patient is prepped and draped in the usual sterile fashion. 1% lidocaine was used for local anesthesia. Using ultrasound guidance, multiple 18 gauge core biopsy were achieved of left supraclavicular/cervical lymph nodes. Tissues placed into fresh specimen/saline container. Final image was stored. Patient tolerated the procedure well and remained hemodynamically stable throughout. No complications were encountered and no significant blood loss. IMPRESSION: Status post ultrasound-guided biopsy of left supraclavicular lymph nodes. Signed, Dulcy Fanny. Dellia Nims, RPVI [facility] Radiology Electronically Signed   By: Corrie Mckusick D.O.   On: 02/28/2019 17:15     Frayda Egley T. Cushing  If 7PM-7AM, please contact night-coverage [email or web address] Password [facility] 03/01/2019, 10:42 AM

## 2019-03-01 NOTE — Progress Notes (Signed)
SLP Cancellation Note  Patient Details Name: [name] MRN: [id number] DOB: 28-Nov-1928   Cancelled treatment:       Reason Eval/Treat Not Completed: Other (comment)(pt npo for procedure)   Macario Golds 03/01/2019, 9:44 AM  Kathleen Lime, MS [place] Office [phone]

## 2019-03-01 NOTE — Plan of Care (Signed)

## 2019-03-01 NOTE — Telephone Encounter (Signed)
Angela advised. 

## 2019-03-01 NOTE — Progress Notes (Signed)
Called Miachel Roux, son, with updates, condition and plan of care.

## 2019-03-02 ENCOUNTER — Inpatient Hospital Stay: Payer: Self-pay

## 2019-03-02 DIAGNOSIS — A523 Neurosyphilis, unspecified: Secondary | ICD-10-CM

## 2019-03-02 LAB — COMPREHENSIVE METABOLIC PANEL
ALT: 39 U/L (ref 0–44)
AST: 45 U/L — ABNORMAL HIGH (ref 15–41)
Albumin: 2.6 g/dL — ABNORMAL LOW (ref 3.5–5.0)
Alkaline Phosphatase: 66 U/L (ref 38–126)
Anion gap: 10 (ref 5–15)
BUN: 29 mg/dL — ABNORMAL HIGH (ref 8–23)
CO2: 22 mmol/L (ref 22–32)
Calcium: 8.5 mg/dL — ABNORMAL LOW (ref 8.9–10.3)
Chloride: 104 mmol/L (ref 98–111)
Creatinine, Ser: 0.59 mg/dL — ABNORMAL LOW (ref 0.61–1.24)
GFR calc Af Amer: >60 mL/min (ref >60)
GFR calc non Af Amer: >60 mL/min (ref >60)
Glucose, Bld: 111 mg/dL — ABNORMAL HIGH (ref 70–99)
Potassium: 3.9 mmol/L (ref 3.5–5.1)
Sodium: 136 mmol/L (ref 135–145)
Total Bilirubin: 1.1 mg/dL (ref 0.3–1.2)
Total Protein: 5.8 g/dL — ABNORMAL LOW (ref 6.5–8.1)

## 2019-03-02 LAB — C-REACTIVE PROTEIN: CRP: 0.9 mg/dL (ref <1.0)

## 2019-03-02 LAB — CBC
HCT: 41.2 % (ref 39.0–52.0)
Hemoglobin: 13.3 g/dL (ref 13.0–17.0)
MCH: 30.6 pg (ref 26.0–34.0)
MCHC: 32.3 g/dL (ref 30.0–36.0)
MCV: 94.7 fL (ref 80.0–100.0)
Platelets: 203 10*3/uL (ref 150–400)
RBC: 4.35 MIL/uL (ref 4.22–5.81)
RDW: 13.8 % (ref 11.5–15.5)
WBC: 11.1 10*3/uL — ABNORMAL HIGH (ref 4.0–10.5)
nRBC: 0 % (ref 0.0–0.2)

## 2019-03-02 LAB — VITAMIN B1: Vitamin B1 (Thiamine): 107.3 nmol/L (ref 66.5–200.0)

## 2019-03-02 LAB — D-DIMER, QUANTITATIVE: D-Dimer, Quant: 2.58 ug/mL-FEU — ABNORMAL HIGH (ref 0.00–0.50)

## 2019-03-02 LAB — MAGNESIUM: Magnesium: 2.5 mg/dL — ABNORMAL HIGH (ref 1.7–2.4)

## 2019-03-02 MED ORDER — PENICILLIN G POTASSIUM 20000000 UNITS IJ SOLR
12.0000 10*6.[IU] | Freq: Two times a day (BID) | INTRAVENOUS | Status: DC
  Administered 2019-03-02 – 2019-03-13 (×21): 12 10*6.[IU] via INTRAVENOUS
  Filled 2019-03-02 (×4): qty 12
  Filled 2019-03-02: qty 10
  Filled 2019-03-02 (×18): qty 12

## 2019-03-02 MED ORDER — LACTULOSE ENEMA
300.0000 mL | Freq: Two times a day (BID) | ORAL | Status: DC | PRN
  Filled 2019-03-02: qty 300

## 2019-03-02 MED ORDER — LACTULOSE 10 GM/15ML PO SOLN: 30.0000 g | Freq: Two times a day (BID) | ORAL | Status: DC | PRN

## 2019-03-02 NOTE — Progress Notes (Signed)
[place] for Infectious Disease   Reason for visit: Follow up on AMS  Interval History: LP noted and protein elevated, no WBCs. VDRL pending.  The patient does not provide any history  Physical Exam: Constitutional:  Vitals:   03/02/19 0453 03/02/19 1341  BP: (!) 143/76 139/70  Pulse: 74 67  Resp: 20 16  Temp: (!) 97.5 F (36.4 C) 97.7 F (36.5 C)  SpO2: 99% 96%   patient appears in NAD Neuro: confused  Review of Systems: Unable to be assessed due to mental status  Lab Results  Component Value Date   WBC 11.1 (H) 03/02/2019   HGB 13.3 03/02/2019   HCT 41.2 03/02/2019   MCV 94.7 03/02/2019   PLT 203 03/02/2019    Lab Results  Component Value Date   CREATININE 0.59 (L) 03/02/2019   BUN 29 (H) 03/02/2019   NA 136 03/02/2019   K 3.9 03/02/2019   CL 104 03/02/2019   CO2 22 03/02/2019    Lab Results  Component Value Date   ALT 39 03/02/2019   AST 45 (H) 03/02/2019   ALKPHOS 66 03/02/2019     Microbiology: Recent Results (from the past 240 hour(s))  Urine culture     Status: Abnormal   Collection Time: 02/22/19 12:26 PM   Specimen: Urine, Clean Catch  Result Value Ref Range Status   Specimen Description   Final    URINE, CLEAN CATCH Performed at [facility], [place] [address], [place], [place] [address]    Special Requests   Final    NONE Performed at [facility], [place] [address], [place], [place] [address]    Culture MULTIPLE SPECIES PRESENT, SUGGEST RECOLLECTION (A)  Final   Report Status 02/23/2019 FINAL  Final  SARS CORONAVIRUS 2 (TAT 6-24 HRS) Nasopharyngeal Nasopharyngeal Swab     Status: Abnormal   Collection Time: 02/22/19  5:06 PM   Specimen: Nasopharyngeal Swab  Result Value Ref Range Status   SARS Coronavirus 2 POSITIVE (A) NEGATIVE Final    Comment: RESULT CALLED TO, READ BACK BY AND VERIFIED WITH: B. COLES,RN 2343 02/22/2019 T. TYSOR (NOTE) SARS-CoV-2 target nucleic acids are DETECTED. The  SARS-CoV-2 RNA is generally detectable in upper and lower respiratory specimens during the acute phase of infection. Positive results are indicative of the presence of SARS-CoV-2 RNA. Clinical correlation with patient history and other diagnostic information is  necessary to determine patient infection status. Positive results do not rule out bacterial infection or co-infection with other viruses.  The expected result is Negative. Fact Sheet for Patients: SugarRoll.be Fact Sheet for Healthcare Providers: https://www.woods-mathews.com/ This test is not yet approved or cleared by the [place] FDA and  has been authorized for detection and/or diagnosis of SARS-CoV-2 by FDA under an Emergency Use Authorization (EUA). This EUA will remain  in effect (meaning this test can be used) for th e duration of the COVID-19 declaration under Section 564(b)(1) of the Act, 21 U.S.C. section 360bbb-3(b)(1), unless the authorization is terminated or revoked sooner. Performed at [facility], [place] [address] [place], [place] [address]   CSF culture     Status: None (Preliminary result)   Collection Time: 03/01/19  3:17 PM   Specimen: PATH Cytology CSF; Cerebrospinal Fluid  Result Value Ref Range Status   Specimen Description   Final    CSF Performed at [facility], [place] [address] [place], [place] [address]    Special Requests  Final    NONE Performed at [facility], [place] [address], [place], [place] [address]    Gram Stain   Final    NO WBC SEEN NO ORGANISMS SEEN CYTOSPIN SMEAR Gram Stain Report Called to,Read Back By and Verified With: EDWARDS,J. RN @1713  ON 01.07.2021 BY COHEN,K Performed at [facility], [place] [address], [place], [place] [address]    Culture   Final    NO GROWTH < 24 HOURS Performed at [place] [address] [place], [place] [address]    Report  Status PENDING  Incomplete    Impression/Plan:  1. Neurosyphilis - he has had an altered mental state with decline recently and a positive RPR in his blood.  CSF findings noted and no WBCs but protein level significantly up at 100 which makes neurosyphilis likely (though could be up from lymphoma as well, potentially).  The VDRL is pending but can be negative in 1/3 of patients with neurosyphilis so with the positive protein level, I do feel treatment is indicated. I will start IV penicillin which he will need for 14 days I will order a picc line  2.  Access - I will order a picc line and this can be removed after his last dose.    3.  COVID - no respiratory symptoms/mild disease and has remained on room air. Positive on 12/31 and isolation can be removed Sunday (after 10 days from the first positive test)  I will sign off, thanks for consultation

## 2019-03-02 NOTE — Plan of Care (Signed)

## 2019-03-02 NOTE — Progress Notes (Signed)
  Speech Language Pathology  Patient Details Name: [name] MRN: [id number] DOB: September 22, 1928 Today's Date: 03/02/2019 Time:  -     ST services has evaluated for swallow and recommended Dys 3, thin liquids. Attempted to see several days and npo for LP. Therapist reviewed chart and spoke with both RN and RN tech who did not report s/s aspiration or difficulty masticating (texture now regular from Dys 3). ST will sign off at this time.                 Houston Siren 03/02/2019, 12:59 PM  Orbie Pyo Colvin Caroli.Ed Risk analyst [phone] Office [phone]

## 2019-03-02 NOTE — Progress Notes (Addendum)
PHARMACY CONSULT NOTE FOR:  OUTPATIENT  PARENTERAL ANTIBIOTIC THERAPY (OPAT)  Indication: Neurosyphilis Regimen: Penicillin G potassium 24 Million Units Continuous Infusion q24h End date: 03/15/2019  IV antibiotic discharge orders are pended. To discharging provider:  please sign these orders via discharge navigator,  Select New Orders & click on the button choice - Manage This Unsigned Work.     Thank you for allowing pharmacy to be a part of this patient's care.  Eudelia Bunch, Pharm.D [phone] 03/02/2019 3:26 PM

## 2019-03-02 NOTE — Progress Notes (Signed)
PROGRESS NOTE  [name] [id number] DOB: 12-30-1928   PCP: [name], MD  Patient is from: Home  DOA: 02/22/2019 LOS: 8  Brief Narrative / Interim history:  86 y.o. male with a PMH of advanced dementia, hypertension, hyperlipidemia, GERD and BPH who was admitted 02/22/2019 with a chief complaint of generalized weakness progressive over several weeks.  In the ED, he had low-grade temperature but was noted to be hypoxic with a room air saturation of 84% associated with tachycardia.  Chest x-ray was clear and CT was negative for acute infarct.  CT of the abdomen and pelvis showed a splenic mass and retroperitoneal adenopathy along with patchy bilateral airspace disease consistent with viral pneumonia.  COVID-19 testing was positive as was RPR and HIV testing.  T. Pallidum positive. However, RPR titer 1:1. Unclear if this was treated or untreated.  Confirmatory HIV test negative.  Infectious disease, IR and oncology consulted.   Patient had biopsy of supraclavicular lymph nodes on 02/27/2018.  Pathology pending.  Patient had lumbar puncture on 02/28/2018.  Cytology not indicated for specific etiology.  Gram stain and cultures negative so far.  VDRL pending.  Subjective: No major events overnight of this morning.  Somnolent.  Only mumbles some moves extremities responds to tactile stimuli.  Does not appear to be in distress.  Objective: Vitals:   03/01/19 0458 03/01/19 1308 03/01/19 2128 03/02/19 0453  BP: (!) 107/51 107/72 (!) 141/78 (!) 143/76  Pulse: (!) 58 (!) 101 93 74  Resp: 20 20 20 20   Temp: 98.1 F (36.7 C) 97.6 F (36.4 C) 97.6 F (36.4 C) (!) 97.5 F (36.4 C)  TempSrc:  Oral Oral Oral  SpO2: 95% 98% 98% 99%  Weight:      Height:        Intake/Output Summary (Last 24 hours) at 03/02/2019 1243 Last data filed at 03/02/2019 1000 Gross per 24 hour  Intake 2235.74 ml  Output --  Net 2235.74 ml   Filed Weights   02/22/19 2056  Weight: 71.5 kg     Examination:  GENERAL: Frail and chronically ill-appearing.  No distress. HEENT: MMM.  Vision and hearing grossly intact.  NECK: Supple.  No apparent JVD.  RESP: On RA.  No IWOB.  Fair aeration bilaterally. CVS:  RRR. Heart sounds normal.  ABD/GI/GU: Bowel sounds present. Soft. Non tender.  MSK/EXT:  Moves extremities. No apparent deformity. No edema.  SKIN: no apparent skin lesion or wound NEURO: Somnolent.  Only moans and moves his extremities in response to tactile stimuli.  No apparent focal neuro deficit. PSYCH: Somnolent but calm.  Procedures:  None  Assessment & Plan: Pneumonia due to COVID-19 infection: CT chest with bibasilar groundglass opacities.  Inflammatory markers started to improve.  On room air. 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 RPR and T. Pallidum positive although low titer.  CSF cytology not indicated for specific etiology.  CSF Gram stain and cultures negative so far.  CSF VDRL in process.  Ammonia slightly elevated at 36.  Thiamine and vitamin B12 normal.  COVID-19 and acute hospitalization could contribute. -Continue low-dose Seroquel at bedtime  -Haldol as needed for agitation-has not been receiving. -Delirium precautions -Bowel regimen with lactulose  Possible neurosyphilis: T. Pallidum  positive but RPR titer low. Unclear if this is treated or not.  CSF VDRL in process. -Appreciate ID input.  Splenic mass/retroperitoneal right healer and left supraclavicular adenopathy -Had a biopsy of supraclavicular lymph node on 1/6.  Pathology pending. -Oncology on board.  Hypotension: Normotensive. -Hold Avapro -Continue LR at 75 cc an hour.  Glaucoma -Continue  home eyedrops.  BPH: Not on medication at home. -Monitor urinary output.  Goal of care: Patient remains full code per family wish although this would pose more harm than benefit in light of patient's condition.  Palliative care following. -Appreciate palliative input-full code and full scope of care    Moderate protein calorie malnutrition Nutrition Problem: Increased nutrient needs Etiology: acute illness(COVID-19)  Signs/Symptoms: estimated needs  Interventions: Ensure Enlive (each supplement provides 350kcal and 20 grams of protein), Magic cup, MVI   DVT prophylaxis: Subcu Lovenox Code Status: Full code Family Communication: Discussed with patient's son over the phone 1/7. Disposition Plan: Remains inpatient due to encephalopathy and further work-up and treatment Consultants: IR, oncology, palliative care and infectious disease   Microbiology summarized: COVID-19 positive. Urine culture with multiple species.  Sch Meds:  Scheduled Meds: . vitamin C  500 mg Oral Daily  . aspirin EC  81 mg Oral Daily  . atorvastatin  5 mg Oral Daily  . brinzolamide  1 drop Both Eyes TID  . dexamethasone (DECADRON) injection  6 mg Intravenous Q24H  . donepezil  10 mg Oral QHS  . feeding supplement (ENSURE ENLIVE)  237 mL Oral BID BM  . latanoprost  1 drop Both Eyes QHS  . multivitamin with minerals  1 tablet Oral Daily  . polyethylene glycol  17 g Oral Daily  . QUEtiapine  25 mg Oral QHS  . senna-docusate  1 tablet Oral BID  . zinc sulfate  220 mg Oral Daily   Continuous Infusions: . lactated ringers 75 mL/hr at 03/02/19 0600   PRN Meds:.haloperidol lactate  Antimicrobials: Anti-infectives (From admission, onward)   Start     Dose/Rate Route Frequency Ordered Stop   02/24/19 1000  remdesivir 100 mg in sodium chloride 0.9 % 100 mL IVPB     100 mg 200 mL/hr over 30 Minutes Intravenous Daily 02/23/19 1519 02/27/19 1120   02/23/19 1600  cefTRIAXone (ROCEPHIN) 1 g in sodium chloride  0.9 % 100 mL IVPB  Status:  Discontinued     1 g 200 mL/hr over 30 Minutes Intravenous Every 24 hours 02/22/19 1751 02/23/19 0818   02/23/19 1600  remdesivir 200 mg in sodium chloride 0.9% 250 mL IVPB     200 mg 580 mL/hr over 30 Minutes Intravenous Once 02/23/19 1519 02/23/19 1624   02/23/19 1500  azithromycin (ZITHROMAX) 500 mg in sodium chloride 0.9 % 250 mL IVPB  Status:  Discontinued     500 mg 250 mL/hr over 60 Minutes Intravenous Every 24 hours 02/22/19 1751 02/23/19 0818   02/22/19 1630  cefTRIAXone (ROCEPHIN) 1 g in sodium chloride 0.9 % 100 mL IVPB     1 g 200 mL/hr over 30 Minutes Intravenous  Once 02/22/19 1615 02/22/19 1729   02/22/19 1630  azithromycin (ZITHROMAX) 500 mg in sodium chloride 0.9 % 250 mL IVPB     500 mg 250 mL/hr over 60 Minutes Intravenous  Once 02/22/19 1615 02/22/19 1902       I have personally reviewed the following labs and images: CBC: Recent Labs  Lab 02/26/19 0340 02/27/19 0319 02/28/19 0311 03/01/19 0257 03/02/19 0255  WBC 14.2* 9.0 8.8 11.5* 11.1*  NEUTROABS 11.1*  --   --   --   --   HGB 13.7 12.5* 13.1 13.3 13.3  HCT 41.2 38.2* 41.0 40.4 41.2  MCV 92.6 92.9 95.6 93.1 94.7  PLT PLATELET CLUMPS NOTED ON SMEAR, UNABLE TO ESTIMATE PLATELET CLUMPING, SUGGEST RECOLLECTION OF SAMPLE IN CITRATE TUBE. PLATELET CLUMPING, SUGGEST RECOLLECTION OF SAMPLE IN CITRATE TUBE. 153 203   BMP &GFR Recent Labs  Lab 02/26/19 0340 02/27/19 0319 02/28/19 0311 03/01/19 0257 03/02/19 0255  NA 137 138 138 136 136  K 3.6 3.7 3.9 3.7 3.9  CL 106 105 107 105 104  CO2 21* 21* 21* 22 22  GLUCOSE 98 83 123* 114* 111*  BUN 24* 27* 22 32* 29*  CREATININE 0.66 0.68 0.53* 0.56* 0.59*  CALCIUM 8.7* 8.4* 8.5* 8.5* 8.5*  MG 2.2 2.3 2.5* 2.5* 2.5*   Estimated Creatinine Clearance: 57.6 mL/min (A) (by C-G formula based on SCr of 0.59 mg/dL (L)). Liver & Pancreas: Recent Labs  Lab 02/26/19 0340 03/01/19 0257 03/02/19 0255  AST 74* 54* 45*  ALT 39 43 39   ALKPHOS 63 64 66  BILITOT 0.6 1.0 1.1  PROT 6.2* 5.7* 5.8*  ALBUMIN 2.9* 2.5* 2.6*   No results for input(s): LIPASE, AMYLASE in the last 168 hours. Recent Labs  Lab 03/01/19 0257  AMMONIA 36*   Diabetic: No results for input(s): HGBA1C in the last 72 hours. No results for input(s): GLUCAP in the last 168 hours. Cardiac Enzymes: No results for input(s): CKTOTAL, CKMB, CKMBINDEX, TROPONINI in the last 168 hours. No results for input(s): PROBNP in the last 8760 hours. Coagulation Profile: Recent Labs  Lab 02/26/19 0340 02/27/19 0319 03/01/19 0257  INR 1.2 1.2 1.2   Thyroid Function Tests: No results for input(s): TSH, T4TOTAL, FREET4, T3FREE, THYROIDAB in the last 72 hours. Lipid Profile: No results for input(s): CHOL, HDL, LDLCALC, TRIG, CHOLHDL, LDLDIRECT in the last 72 hours. Anemia Panel: No results for input(s): VITAMINB12, FOLATE, FERRITIN, TIBC, IRON, RETICCTPCT in the last 72 hours. Urine analysis:    Component Value Date/Time   COLORURINE YELLOW 02/22/2019 1226   APPEARANCEUR CLEAR 02/22/2019 1226   LABSPEC 1.019 02/22/2019 1226   PHURINE 7.0 02/22/2019 1226   GLUCOSEU NEGATIVE 02/22/2019 1226   HGBUR NEGATIVE 02/22/2019 1226   BILIRUBINUR NEGATIVE 02/22/2019 1226   BILIRUBINUR negative 02/05/2019 1610   KETONESUR NEGATIVE 02/22/2019 1226   PROTEINUR NEGATIVE 02/22/2019 1226   UROBILINOGEN 0.2 02/05/2019 1610   UROBILINOGEN 0.2 03/21/2007 0944   NITRITE NEGATIVE 02/22/2019 1226   LEUKOCYTESUR NEGATIVE 02/22/2019 1226   Sepsis Labs: Invalid input(s): PROCALCITONIN, [place]  Microbiology: Recent Results (from the past 240 hour(s))  Urine culture     Status: Abnormal   Collection Time: 02/22/19 12:26 PM   Specimen: Urine, Clean Catch  Result Value Ref Range Status   Specimen Description   Final    URINE, CLEAN CATCH Performed at [facility], [place] [address], [place], [place] [address]    Special Requests   Final     NONE Performed at [facility], [place] [address], [place], [place] [address]    Culture MULTIPLE SPECIES PRESENT, SUGGEST RECOLLECTION (A)  Final   Report Status 02/23/2019 FINAL  Final  SARS CORONAVIRUS 2 (TAT 6-24 HRS) Nasopharyngeal Nasopharyngeal Swab     Status: Abnormal   Collection Time: 02/22/19  5:06 PM   Specimen: Nasopharyngeal Swab  Result Value Ref Range Status  SARS Coronavirus 2 POSITIVE (A) NEGATIVE Final    Comment: RESULT CALLED TO, READ BACK BY AND VERIFIED WITH: B. COLES,RN 2343 02/22/2019 T. TYSOR (NOTE) SARS-CoV-2 target nucleic acids are DETECTED. The SARS-CoV-2 RNA is generally detectable in upper and lower respiratory specimens during the acute phase of infection. Positive results are indicative of the presence of SARS-CoV-2 RNA. Clinical correlation with patient history and other diagnostic information is  necessary to determine patient infection status. Positive results do not rule out bacterial infection or co-infection with other viruses.  The expected result is Negative. Fact Sheet for Patients: SugarRoll.be Fact Sheet for Healthcare Providers: https://www.woods-mathews.com/ This test is not yet approved or cleared by the [place] FDA and  has been authorized for detection and/or diagnosis of SARS-CoV-2 by FDA under an Emergency Use Authorization (EUA). This EUA will remain  in effect (meaning this test can be used) for th e duration of the COVID-19 declaration under Section 564(b)(1) of the Act, 21 U.S.C. section 360bbb-3(b)(1), unless the authorization is terminated or revoked sooner. Performed at [facility], [place] [address] [place], [place] [address]   CSF culture     Status: None (Preliminary result)   Collection Time: 03/01/19  3:17 PM   Specimen: PATH Cytology CSF; Cerebrospinal Fluid  Result Value Ref Range Status   Specimen Description   Final    CSF Performed at [facility], [place] [address] [place], [place] [address]    Special Requests   Final    NONE Performed at [facility], [place] [address] [place], [place] [address]    Gram Stain   Final    NO WBC SEEN NO ORGANISMS SEEN CYTOSPIN SMEAR Gram Stain Report Called to,Read Back By and Verified With: EDWARDS,J. RN @1713  ON 01.07.2021 BY COHEN,K Performed at [facility], [place] [address], [place], [place] [address]    Culture   Final    NO GROWTH < 24 HOURS Performed at [place] [address], [place], [place] [address]    Report Status PENDING  Incomplete    Radiology Studies: DG FL GUIDED LUMBAR PUNCTURE  Result Date: 03/01/2019 CLINICAL DATA:  Encephalopathy.  Positive syphilis screening test. EXAM: DIAGNOSTIC LUMBAR PUNCTURE UNDER FLUOROSCOPIC GUIDANCE FLUOROSCOPY TIME:  Fluoroscopy Time:  1 minutes 18 seconds Radiation exposure: 14.0 mGy Number of Acquired Spot Images: 0 PROCEDURE: Due to the patient's altered mental status, informed consent was obtained by telephone from the patient's son Bush Buehring. With the patient prone, the lower back was prepped with Betadine. 1% Lidocaine was used for local anesthesia. Lumbar puncture was performed at the L4-5 level using a 20 gauge needle with return of clear CSF with an opening pressure of 9 cm water. 10 ml of CSF were obtained for laboratory studies. The patient tolerated the procedure well and there were no apparent complications. IMPRESSION: Successful diagnostic lumbar puncture performed at L4-5 under fluoroscopic guidance. No immediate complication. Electronically Signed   By: Marlaine Hind M.D.   On: 03/01/2019 16:03     Skylor Hughson T. Lockwood  If 7PM-7AM, please contact night-coverage [email or web address] Password [facility] 03/02/2019, 12:43 PM

## 2019-03-03 LAB — COMPREHENSIVE METABOLIC PANEL
ALT: 38 U/L (ref 0–44)
AST: 42 U/L — ABNORMAL HIGH (ref 15–41)
Albumin: 2.7 g/dL — ABNORMAL LOW (ref 3.5–5.0)
Alkaline Phosphatase: 69 U/L (ref 38–126)
Anion gap: 9 (ref 5–15)
BUN: 34 mg/dL — ABNORMAL HIGH (ref 8–23)
CO2: 22 mmol/L (ref 22–32)
Calcium: 8.6 mg/dL — ABNORMAL LOW (ref 8.9–10.3)
Chloride: 105 mmol/L (ref 98–111)
Creatinine, Ser: 0.65 mg/dL (ref 0.61–1.24)
GFR calc Af Amer: >60 mL/min (ref >60)
GFR calc non Af Amer: >60 mL/min (ref >60)
Glucose, Bld: 117 mg/dL — ABNORMAL HIGH (ref 70–99)
Potassium: 4 mmol/L (ref 3.5–5.1)
Sodium: 136 mmol/L (ref 135–145)
Total Bilirubin: 1 mg/dL (ref 0.3–1.2)
Total Protein: 6 g/dL — ABNORMAL LOW (ref 6.5–8.1)

## 2019-03-03 LAB — CBC
HCT: 41.3 % (ref 39.0–52.0)
Hemoglobin: 13.4 g/dL (ref 13.0–17.0)
MCH: 30.8 pg (ref 26.0–34.0)
MCHC: 32.4 g/dL (ref 30.0–36.0)
MCV: 94.9 fL (ref 80.0–100.0)
Platelets: ADEQUATE 10*3/uL (ref 150–400)
RBC: 4.35 MIL/uL (ref 4.22–5.81)
RDW: 13.9 % (ref 11.5–15.5)
WBC: 12.2 10*3/uL — ABNORMAL HIGH (ref 4.0–10.5)
nRBC: 0 % (ref 0.0–0.2)

## 2019-03-03 LAB — MAGNESIUM: Magnesium: 2.5 mg/dL — ABNORMAL HIGH (ref 1.7–2.4)

## 2019-03-03 LAB — C-REACTIVE PROTEIN: CRP: 0.7 mg/dL (ref <1.0)

## 2019-03-03 LAB — D-DIMER, QUANTITATIVE: D-Dimer, Quant: 3.55 ug/mL-FEU — ABNORMAL HIGH (ref 0.00–0.50)

## 2019-03-03 MED ORDER — ENSURE ENLIVE PO LIQD
237.0000 mL | Freq: Three times a day (TID) | ORAL | Status: DC
  Administered 2019-03-03 – 2019-03-11 (×17): 237 mL via ORAL

## 2019-03-03 MED ORDER — SODIUM CHLORIDE 0.9% FLUSH
10.0000 mL | INTRAVENOUS | Status: DC | PRN
  Administered 2019-03-06: 05:00:00 10 mL

## 2019-03-03 MED ORDER — CHLORHEXIDINE GLUCONATE CLOTH 2 % EX PADS
6.0000 | MEDICATED_PAD | Freq: Every day | CUTANEOUS | Status: DC
  Administered 2019-03-06 – 2019-03-14 (×4): 6 via TOPICAL

## 2019-03-03 MED ORDER — ENOXAPARIN SODIUM 40 MG/0.4ML ~~LOC~~ SOLN
40.0000 mg | Freq: Every day | SUBCUTANEOUS | Status: AC
  Administered 2019-03-03 – 2019-03-08 (×6): 40 mg via SUBCUTANEOUS
  Filled 2019-03-03 (×6): qty 0.4

## 2019-03-03 MED ORDER — CHLORHEXIDINE GLUCONATE CLOTH 2 % EX PADS
6.0000 | MEDICATED_PAD | Freq: Every day | CUTANEOUS | Status: DC
  Administered 2019-03-03 – 2019-03-16 (×11): 6 via TOPICAL

## 2019-03-03 NOTE — Progress Notes (Signed)
Pt took meds crushed in chocolate pudding tonight, ate the whole cup of puddle when fed.  Also drank his whole ensure on his own.  May need to feed pt at meal times to increase PO intake

## 2019-03-03 NOTE — Progress Notes (Signed)
ANTICOAGULATION CONSULT NOTE - Initial Consult  Pharmacy Consult for Lovenox Indication: VTE prophylaxis  No Known Allergies  Patient Measurements: Height: 5\' 6"  (167.6 cm) Weight: 157 lb 10.1 oz (71.5 kg) IBW/kg (Calculated) : 63.8   Vital Signs: Temp: 98.5 F (36.9 C) (01/09 0454) Temp Source: Oral (01/09 0454) BP: 140/73 (01/09 0454) Pulse Rate: 78 (01/09 0454)  Labs: Recent Labs    03/01/19 0257 03/02/19 0255 03/03/19 0256  HGB 13.3 13.3 13.4  HCT 40.4 41.2 41.3  PLT 153 203 PLATELET CLUMPS NOTED ON SMEAR, COUNT APPEARS ADEQUATE  APTT 31  --   --   LABPROT 15.0  --   --   INR 1.2  --   --   CREATININE 0.56* 0.59* 0.65    Estimated Creatinine Clearance: 57.6 mL/min (by C-G formula based on SCr of 0.65 mg/dL).   Medical History: Past Medical History:  Diagnosis Date  . Allergic rhinitis   . BPH (benign prostatic hypertrophy) with urinary obstruction 2000   40gm, L lobe 29mm prostate nodule; followed yearly by Dr Diona Fanti  . GERD (gastroesophageal reflux disease)   . Glaucoma   . Herpes zoster ophthalmicus    history  . History of kidney stones remote   x 1-Ca monhydrate, hyperoxalate urine s/p lithotripsy  . HLD (hyperlipidemia)   . HTN (hypertension)   . Impaired glucose tolerance   . Impaired hearing    uses hearing aides bilaterally  . Osteopenia     Medications:  No previous anticoagulation  Assessment:  86 yr male COVID +  D-dimer = 3.55  BMI = 25  CrCl = 57 ml/min  Goal of Therapy:  Prophylactic anticoagulation    Plan:   Lovenox 40 mg sq q24h  Follow D-Dimer trend.  If D-dimer > 5, will need to adjust Lovenox dose  Everette Rank, PharmD 03/03/2019,12:12 PM

## 2019-03-03 NOTE — Plan of Care (Signed)

## 2019-03-03 NOTE — Progress Notes (Signed)
Nutrition Follow-up  RD working remotely.  DOCUMENTATION CODES:   Not applicable, suspect malnutrition but unable to confirm at this time  INTERVENTION:   Given continued inadequate PO intake and recent significant weight loss (3.6% in past 3 weeks), recommend considering small-bore NG tube placement and initiation of enteral nutrition. Recommend: - Osmolite 1.5 @ 55 ml/hr (1320 ml/day)  Recommended tube feeding regimen would provide 1980 kcal, 83 grams of protein, and 1006 ml of H2O.  - Increase Ensure Enlive po to TID, each supplement provides 350 kcal and 20 grams of protein  - Continue Magic cup BID with meals, each supplement provides 290 kcal and 9 grams of protein  - Continue MVI with minerals daily  NUTRITION DIAGNOSIS:   Increased nutrient needs related to acute illness (COVID-19) as evidenced by estimated needs.  Ongoing, being addressed via supplements  GOAL:   Patient will meet greater than or equal to 90% of their needs  Unmet at this time  MONITOR:   PO intake, Supplement acceptance, Labs, Weight trends  REASON FOR ASSESSMENT:   Consult Assessment of nutrition requirement/status  ASSESSMENT:   86 y.o. male with medical history of advanced dementia, HTN, hyperlipidemia, GERD, and BPH. He was admitted on 12/31 with generalized weakness, progressive over several weeks.  In the ED, he had low-grade temperature and was noted to be hypoxic with a room air saturation of 84% associated with tachycardia.  CXR was clear and CT was negative for acute infarct.  CT abdomen/pelvis showed a splenic mass and retroperitoneal adenopathy along with patchy bilateral airspace disease consistent with viral pna.  COVID-19 testing was positive as was RPR and HIV testing (awaiting confirmatory tests).  01/06 - s/p lymph node biopsy 01/07 - s/p LP 01/09 - s/p PICC  Pt being treated for neurosyphilis with IV penicillin.  No new weights since admission.  Per Palliative Care  note on 1/04, pt is full code and "family is invested in plan for all offered interventions."  Given continued inadequate PO intake and recent significant weight loss, recommend considering small-bore NG tube placement and initiation of enteral nutrition. RD will leave tube feeding recommendations for use if nutrition support is pursued.  Discussed recommendation with MD who will discuss with family regarding how to proceed.  Meal Completion: 0-40% x last 8 meals  Medications reviewed and include: vitamin C, decadron, Ensure Enlive BID, MVI with minerals, Miralax, senna, zinc sulfate, IV penicillin  Labs reviewed: magnesium 2.5  UOP: 1430 ml x 24 hours  NUTRITION - FOCUSED PHYSICAL EXAM:  Unable to complete at this time. RD working remotely.  Diet Order:   Diet Order            Diet regular Room service appropriate? Yes; Fluid consistency: Thin  Diet effective now              EDUCATION NEEDS:   No education needs have been identified at this time  Skin:  Skin Assessment: Reviewed RN Assessment  Last BM:  03/01/19 large type 4  Height:   Ht Readings from Last 1 Encounters:  02/22/19 5\' 6"  (1.676 m)    Weight:   Wt Readings from Last 1 Encounters:  02/22/19 71.5 kg    Ideal Body Weight:  64.5 kg  BMI:  Body mass index is 25.44 kg/m.  Estimated Nutritional Needs:   Kcal:  1790-2000 kcal  Protein:  80-90 grams  Fluid:  >/= 2 L/day    Gaynell Face, MS, RD, LDN Inpatient Clinical  Dietitian Pager: [phone] Weekend/After Hours: [phone]

## 2019-03-03 NOTE — Progress Notes (Signed)
PROGRESS NOTE  [name] [id number] DOB: 1929-01-20   PCP: [name], MD  Patient is from: Home  DOA: 02/22/2019 LOS: 9  Brief Narrative / Interim history:  86 y.o. male with a PMH of advanced dementia, hypertension, hyperlipidemia, GERD and BPH who was admitted 02/22/2019 with a chief complaint of generalized weakness progressive over several weeks.  In the ED, he had low-grade temperature but was noted to be hypoxic with a room air saturation of 84% associated with tachycardia.  Chest x-ray was clear and CT was negative for acute infarct.  CT of the abdomen and pelvis showed a splenic mass and retroperitoneal adenopathy along with patchy bilateral airspace disease consistent with viral pneumonia.  COVID-19 testing was positive as was RPR and HIV testing.  T. Pallidum positive. However, RPR titer 1:1. Unclear if this was treated or untreated.  Confirmatory HIV test negative.  Infectious disease, IR and oncology consulted.  He underwent lumbar puncture on 02/28/2018.  Cytology not indicated for specific etiology.  CSF protein elevated to 100.  Gram stain and cultures negative so far.  VDRL pending.  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   Patient had biopsy of supraclavicular lymph nodes on 02/27/2018.  Pathology pending.   Subjective: No major events overnight of this morning.  Sleepy.  Says "I am all right" but could not provide further history.  Does not appear to be in distress.  Objective: Vitals:   03/01/19 2128 03/02/19 0453 03/02/19 1341 03/03/19 0454  BP: (!) 141/78 (!) 143/76 139/70 140/73  Pulse: 93 74 67 78  Resp: 20 20 16 18   Temp: 97.6 F (36.4 C) (!) 97.5 F (36.4 C) 97.7 F (36.5 C) 98.5 F (36.9 C)  TempSrc: Oral Oral Oral Oral  SpO2: 98% 99% 96% 97%  Weight:      Height:        Intake/Output Summary (Last 24 hours) at 03/03/2019 1137 Last data filed at 03/03/2019 0940 Gross per 24 hour  Intake  967.87 ml  Output 2130 ml  Net -1162.13 ml   Filed Weights   02/22/19 2056  Weight: 71.5 kg    Examination:  GENERAL: Frail and chronically ill-appearing.  No distress. HEENT: MMM.  Vision and hearing grossly intact.  NECK: Supple.  No apparent JVD.  RESP: On RA.  No IWOB.  Fair aeration bilaterally. CVS:  RRR. Heart sounds normal.  ABD/GI/GU: Bowel sounds present. Soft. Non tender.  MSK/EXT:  Moves extremities. No apparent deformity. No edema.  SKIN: no apparent skin lesion or wound NEURO: Somnolent.  Only moans and moves his extremities in response to tactile stimuli.  No apparent focal neuro deficit. PSYCH: Somnolent but calm.  GENERAL: Frail and chronically ill-appearing.  No distress. HEENT: MMM.  Vision and hearing grossly intact.  NECK: Supple.  No apparent JVD.  RESP:  No IWOB.  Fair aeration bilaterally. CVS:  RRR. Heart sounds normal.  ABD/GI/GU: Bowel sounds present. Soft. Non tender.  MSK/EXT:  Moves extremities. No apparent deformity. No edema.  SKIN: no apparent skin lesion or wound NEURO: Sleepy and not oriented.  "I am all right".  No apparent focal neuro deficit. PSYCH: Sleepy.  Procedures:  None  Assessment & Plan: Pneumonia due to COVID-19 infection: CT chest with bibasilar groundglass opacities.  Inflammatory markers started to improve.  On room air. Recent Labs    03/01/19 0257 03/02/19 0255 03/03/19 0256  DDIMER 1.86* 2.58* 3.55*  CRP 1.8* 0.9 0.7  -Remdesivir  1/1-1/6 -Decadron 1/1>> -Restart Lovenox for VTE prophylaxis. 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  Ammonia slightly elevated at 36.  Thiamine and vitamin B12 normal.  COVID-19 and acute hospitalization could contribute. -Continue low-dose Seroquel at bedtime  -Haldol as needed for agitation-has not been receiving. -Delirium precautions -Bowel regimen with  lactulose -Treating-tertiary syphilis as below.  Possible neurosyphilis: RPR and T. Pallidum positive although low titer.  CSF protein elevated to 100.  Cytology not indicated for specific etiology.  CSF Gram stain and cultures negative so far.  CSF VDRL in process.  -ID started continuous penicillin G infusion on 1/8 for a total of 14 days. -Plan to obtain PICC line  Splenic mass/retroperitoneal right healer and left supraclavicular adenopathy -Had a biopsy of supraclavicular lymph node on 1/6.  Pathology pending. -Oncology on board.  Hypotension: Normotensive. -Hold Avapro  Glaucoma -Continue home eyedrops.  BPH: Not on medication at home. -Monitor urinary output.  Goal of care: Patient remains full code per family wish although this would pose more harm than benefit in light of patient's condition.  Palliative care following. -Appreciate palliative input-full code and full scope of care    Moderate protein calorie malnutrition Nutrition Problem: Increased nutrient needs Etiology: acute illness(COVID-19)  Signs/Symptoms: estimated needs  Interventions: Ensure Enlive (each supplement provides 350kcal and 20 grams of protein), Magic cup, MVI   DVT prophylaxis: Subcu Lovenox Code Status: Full code Family Communication: Discussed with patient's son over the phone Disposition Plan: Remains inpatient due to encephalopathy and treatment of tertiary syphilis Consultants: IR, oncology, palliative care and infectious disease   Microbiology summarized: COVID-19 positive. Urine culture with multiple species.  Sch Meds:  Scheduled Meds: . vitamin C  500 mg Oral Daily  . aspirin EC  81 mg Oral Daily  . atorvastatin  5 mg Oral Daily  . brinzolamide  1 drop Both Eyes TID  . dexamethasone (DECADRON) injection  6 mg Intravenous Q24H  . donepezil  10 mg Oral QHS  . feeding supplement (ENSURE ENLIVE)  237 mL Oral BID BM  . latanoprost  1 drop Both Eyes QHS  . multivitamin with  minerals  1 tablet Oral Daily  . polyethylene glycol  17 g Oral Daily  . QUEtiapine  25 mg Oral QHS  . senna-docusate  1 tablet Oral BID  . zinc sulfate  220 mg Oral Daily   Continuous Infusions: . penicillin g continuous IV infusion 12 Million Units (03/03/19 0600)   PRN Meds:.haloperidol lactate, lactulose **OR** lactulose  Antimicrobials: Anti-infectives (From admission, onward)   Start     Dose/Rate Route Frequency Ordered Stop   03/02/19 1800  penicillin G potassium 12 Million Units in dextrose 5 % 500 mL continuous infusion     12 Million Units 41.7 mL/hr over 12 Hours Intravenous Every 12 hours 03/02/19 1514 03/16/19 1759   02/24/19 1000  remdesivir 100 mg in sodium chloride 0.9 % 100 mL IVPB     100 mg 200 mL/hr over 30 Minutes Intravenous Daily 02/23/19 1519 02/27/19 1120   02/23/19 1600  cefTRIAXone (ROCEPHIN) 1 g in sodium chloride 0.9 % 100 mL IVPB  Status:  Discontinued     1 g 200 mL/hr over 30 Minutes Intravenous Every 24 hours 02/22/19 1751 02/23/19 0818   02/23/19 1600  remdesivir 200 mg in sodium chloride 0.9% 250 mL IVPB     200 mg 580 mL/hr over 30 Minutes Intravenous Once  02/23/19 1519 02/23/19 1624   02/23/19 1500  azithromycin (ZITHROMAX) 500 mg in sodium chloride 0.9 % 250 mL IVPB  Status:  Discontinued     500 mg 250 mL/hr over 60 Minutes Intravenous Every 24 hours 02/22/19 1751 02/23/19 0818   02/22/19 1630  cefTRIAXone (ROCEPHIN) 1 g in sodium chloride 0.9 % 100 mL IVPB     1 g 200 mL/hr over 30 Minutes Intravenous  Once 02/22/19 1615 02/22/19 1729   02/22/19 1630  azithromycin (ZITHROMAX) 500 mg in sodium chloride 0.9 % 250 mL IVPB     500 mg 250 mL/hr over 60 Minutes Intravenous  Once 02/22/19 1615 02/22/19 1902       I have personally reviewed the following labs and images: CBC: Recent Labs  Lab 02/26/19 0340 02/27/19 0319 02/28/19 0311 03/01/19 0257 03/02/19 0255 03/03/19 0256  WBC 14.2* 9.0 8.8 11.5* 11.1* 12.2*  NEUTROABS 11.1*  --    --   --   --   --   HGB 13.7 12.5* 13.1 13.3 13.3 13.4  HCT 41.2 38.2* 41.0 40.4 41.2 41.3  MCV 92.6 92.9 95.6 93.1 94.7 94.9  PLT PLATELET CLUMPS NOTED ON SMEAR, UNABLE TO ESTIMATE PLATELET CLUMPING, SUGGEST RECOLLECTION OF SAMPLE IN CITRATE TUBE. PLATELET CLUMPING, SUGGEST RECOLLECTION OF SAMPLE IN CITRATE TUBE. 153 203 PLATELET CLUMPS NOTED ON SMEAR, COUNT APPEARS ADEQUATE   BMP &GFR Recent Labs  Lab 02/27/19 0319 02/28/19 0311 03/01/19 0257 03/02/19 0255 03/03/19 0256  NA 138 138 136 136 136  K 3.7 3.9 3.7 3.9 4.0  CL 105 107 105 104 105  CO2 21* 21* 22 22 22   GLUCOSE 83 123* 114* 111* 117*  BUN 27* 22 32* 29* 34*  CREATININE 0.68 0.53* 0.56* 0.59* 0.65  CALCIUM 8.4* 8.5* 8.5* 8.5* 8.6*  MG 2.3 2.5* 2.5* 2.5* 2.5*   Estimated Creatinine Clearance: 57.6 mL/min (by C-G formula based on SCr of 0.65 mg/dL). Liver & Pancreas: Recent Labs  Lab 02/26/19 0340 03/01/19 0257 03/02/19 0255 03/03/19 0256  AST 74* 54* 45* 42*  ALT 39 43 39 38  ALKPHOS 63 64 66 69  BILITOT 0.6 1.0 1.1 1.0  PROT 6.2* 5.7* 5.8* 6.0*  ALBUMIN 2.9* 2.5* 2.6* 2.7*   No results for input(s): LIPASE, AMYLASE in the last 168 hours. Recent Labs  Lab 03/01/19 0257  AMMONIA 36*   Diabetic: No results for input(s): HGBA1C in the last 72 hours. No results for input(s): GLUCAP in the last 168 hours. Cardiac Enzymes: No results for input(s): CKTOTAL, CKMB, CKMBINDEX, TROPONINI in the last 168 hours. No results for input(s): PROBNP in the last 8760 hours. Coagulation Profile: Recent Labs  Lab 02/26/19 0340 02/27/19 0319 03/01/19 0257  INR 1.2 1.2 1.2   Thyroid Function Tests: No results for input(s): TSH, T4TOTAL, FREET4, T3FREE, THYROIDAB in the last 72 hours. Lipid Profile: No results for input(s): CHOL, HDL, LDLCALC, TRIG, CHOLHDL, LDLDIRECT in the last 72 hours. Anemia Panel: No results for input(s): VITAMINB12, FOLATE, FERRITIN, TIBC, IRON, RETICCTPCT in the last 72 hours. Urine  analysis:    Component Value Date/Time   COLORURINE YELLOW 02/22/2019 1226   APPEARANCEUR CLEAR 02/22/2019 1226   LABSPEC 1.019 02/22/2019 1226   PHURINE 7.0 02/22/2019 1226   GLUCOSEU NEGATIVE 02/22/2019 1226   HGBUR NEGATIVE 02/22/2019 1226   [place] 02/22/2019 1226   BILIRUBINUR negative 02/05/2019 1610   KETONESUR NEGATIVE 02/22/2019 1226   PROTEINUR NEGATIVE 02/22/2019 1226   UROBILINOGEN 0.2 02/05/2019 1610  UROBILINOGEN 0.2 03/21/2007 0944   NITRITE NEGATIVE 02/22/2019 [place] 02/22/2019 1226   Sepsis Labs: Invalid input(s): PROCALCITONIN, [place]  Microbiology: Recent Results (from the past 240 hour(s))  Urine culture     Status: Abnormal   Collection Time: 02/22/19 12:26 PM   Specimen: Urine, Clean Catch  Result Value Ref Range Status   Specimen Description   Final    URINE, CLEAN CATCH Performed at [facility], [place] [address], [place], [place] [address]    Special Requests   Final    NONE Performed at [facility], [place] [address] [place], [place] [address]    Culture MULTIPLE SPECIES PRESENT, SUGGEST RECOLLECTION (A)  Final   Report Status 02/23/2019 FINAL  Final  SARS CORONAVIRUS 2 (TAT 6-24 HRS) Nasopharyngeal Nasopharyngeal Swab     Status: Abnormal   Collection Time: 02/22/19  5:06 PM   Specimen: Nasopharyngeal Swab  Result Value Ref Range Status   SARS Coronavirus 2 POSITIVE (A) NEGATIVE Final    Comment: RESULT CALLED TO, READ BACK BY AND VERIFIED WITH: B. COLES,RN 2343 02/22/2019 T. TYSOR (NOTE) SARS-CoV-2 target nucleic acids are DETECTED. The SARS-CoV-2 RNA is generally detectable in upper and lower respiratory specimens during the acute phase of infection. Positive results are indicative of the presence of SARS-CoV-2 RNA. Clinical correlation with patient history and other diagnostic information is  necessary to determine patient infection status. Positive results  do not rule out bacterial infection or co-infection with other viruses.  The expected result is Negative. Fact Sheet for Patients: SugarRoll.be Fact Sheet for Healthcare Providers: https://www.woods-mathews.com/ This test is not yet approved or cleared by the [place] FDA and  has been authorized for detection and/or diagnosis of SARS-CoV-2 by FDA under an Emergency Use Authorization (EUA). This EUA will remain  in effect (meaning this test can be used) for th e duration of the COVID-19 declaration under Section 564(b)(1) of the Act, 21 U.S.C. section 360bbb-3(b)(1), unless the authorization is terminated or revoked sooner. Performed at [facility], [place] [address] [place], [place] [address]   CSF culture     Status: None (Preliminary result)   Collection Time: 03/01/19  3:17 PM   Specimen: PATH Cytology CSF; Cerebrospinal Fluid  Result Value Ref Range Status   Specimen Description   Final    CSF Performed at [facility], [place] [facility] [place] [address], [place], [place] [address]    Special Requests   Final    NONE Performed at [facility], [place] [address], [place], [place] [address]    Gram Stain   Final    NO WBC SEEN NO ORGANISMS SEEN CYTOSPIN SMEAR Gram Stain Report Called to,Read Back By and Verified With: EDWARDS,J. RN @1713  ON 01.07.2021 BY COHEN,K Performed at [facility], [place] [address], [place], [place] [address]    Culture   Final    NO GROWTH 2 DAYS Performed at [place] [address] [place], [place] [address]    Report Status PENDING  Incomplete    Radiology Studies: [place] EKG SITE RITE  Result Date: 03/02/2019 If Site Rite image not attached, placement could not be confirmed due to current cardiac rhythm.    Shelda Truby T[place]  If 7PM-7AM, please contact night-coverage [email or web address] Password [facility] 03/03/2019, 11:37 AM

## 2019-03-03 NOTE — Progress Notes (Signed)
INTERIM NOTE:  86 y/o [place], [place] man with diffuse large-cell B-cell non-Hodgkin's lymphoma diagnosed by left supraclavicular lymph node biopsy 02/28/2019  (a) stage III  (b) IPI score 4 (age, stage, ECOG, LDH): predicts PFS3 of 55% if candidate for aggressive therapy   (c) DLCBC subtype: non-germ cell (activated B-cell subtype)  (d) MYC, BCL2 and BCL6 studies pending  (1) infectious concerns:  (a) COVID-19 positive  (b) workup for tertiary siphilys in process  Assessment: Standard of care for [facility] NHL is CHOP-Rituximab. In patients who can tolerate this aggressive treatment and have an IPI of 4 as [name] does the predicted disease free survival at 3 years is about 55%. If the patient cannot tolerate CHOP, bendamustine + rituximab or similar can be given in a palliative setting. Other palliative options with no chemotherapy include rituximab alone, rituximab + ibrutinib, ibrutinib alone, and lenalidomide.-- Note that the use of ibrutinib and lenolidomide in this setting is still being studied but appears to be of some use in patients with the non-germinal center cell subtype (as here).   [facility] and other pending gene studies if positive may indicate an especially aggressive "double hit" [facility] NHL.  Obviously given the patient's age, dementia and comorbidities family input will be important in treatment decisions. I should be able to meet with the patient and family Monday 01/11 sometime between 5-5:30 PM and we can decide on options at that point.  Will follow with you.

## 2019-03-03 NOTE — Progress Notes (Signed)
Peripherally Inserted Central Catheter/Midline Placement  The IV Nurse has discussed with the patient and/or persons authorized to consent for the patient, the purpose of this procedure and the potential benefits and risks involved with this procedure.  The benefits include less needle sticks, lab draws from the catheter, and the patient may be discharged home with the catheter. Risks include, but not limited to, infection, bleeding, blood clot (thrombus formation), and puncture of an artery; nerve damage and irregular heartbeat and possibility to perform a PICC exchange if needed/ordered by physician.  Alternatives to this procedure were also discussed.  Bard Power PICC patient education guide, fact sheet on infection prevention and patient information card has been provided to patient /or left at bedside.  Telephone consent obtained from wife.  Pt agreeable to procedure as well.  PICC/Midline Placement Documentation  PICC Single Lumen 03/03/19 PICC Right Brachial 39 cm 0 cm (Active)  Indication for Insertion or Continuance of Line Home intravenous therapies (PICC only) 03/03/19 1437  Exposed Catheter (cm) 0 cm 03/03/19 1437  Site Assessment Clean;Dry;Intact 03/03/19 1437  Line Status Flushed;Saline locked;Blood return noted 03/03/19 1437  Dressing Type Transparent 03/03/19 1437  Dressing Status Clean;Dry;Intact;Antimicrobial disc in place 03/03/19 [place] checked and tightened 03/03/19 1437  Line Adjustment (NICU/IV Team Only) No 03/03/19 1437  Dressing Intervention New dressing 03/03/19 1437  Dressing Change Due 03/10/19 03/03/19 1437       Rolena Infante 03/03/2019, 2:38 PM

## 2019-03-03 NOTE — Progress Notes (Signed)
Pt yelling and agitated, yelling about helping get up, but when asked he said "if you could just back the car out he would get out of here".  Redirected and he said "its a terrible thing getting old" and continued to talk about backing the are out.  Haldol administered, decreased stimuli in hopes he can get some rest

## 2019-03-04 ENCOUNTER — Inpatient Hospital Stay (HOSPITAL_COMMUNITY): Payer: Medicare Other

## 2019-03-04 DIAGNOSIS — C8331 Diffuse large B-cell lymphoma, lymph nodes of head, face, and neck: Secondary | ICD-10-CM

## 2019-03-04 DIAGNOSIS — R5381 Other malaise: Secondary | ICD-10-CM

## 2019-03-04 LAB — COMPREHENSIVE METABOLIC PANEL
ALT: 39 U/L (ref 0–44)
AST: 39 U/L (ref 15–41)
Albumin: 2.6 g/dL — ABNORMAL LOW (ref 3.5–5.0)
Alkaline Phosphatase: 70 U/L (ref 38–126)
Anion gap: 7 (ref 5–15)
BUN: 24 mg/dL — ABNORMAL HIGH (ref 8–23)
CO2: 26 mmol/L (ref 22–32)
Calcium: 8.5 mg/dL — ABNORMAL LOW (ref 8.9–10.3)
Chloride: 104 mmol/L (ref 98–111)
Creatinine, Ser: 0.6 mg/dL — ABNORMAL LOW (ref 0.61–1.24)
GFR calc Af Amer: >60 mL/min (ref >60)
GFR calc non Af Amer: >60 mL/min (ref >60)
Glucose, Bld: 139 mg/dL — ABNORMAL HIGH (ref 70–99)
Potassium: 4.4 mmol/L (ref 3.5–5.1)
Sodium: 137 mmol/L (ref 135–145)
Total Bilirubin: 0.9 mg/dL (ref 0.3–1.2)
Total Protein: 5.9 g/dL — ABNORMAL LOW (ref 6.5–8.1)

## 2019-03-04 LAB — C-REACTIVE PROTEIN: CRP: 0.6 mg/dL (ref <1.0)

## 2019-03-04 LAB — GLUCOSE, CAPILLARY
Glucose-Capillary: 100 mg/dL — ABNORMAL HIGH (ref 70–99)
Glucose-Capillary: 94 mg/dL (ref 70–99)

## 2019-03-04 LAB — D-DIMER, QUANTITATIVE: D-Dimer, Quant: 3.43 ug/mL-FEU — ABNORMAL HIGH (ref 0.00–0.50)

## 2019-03-04 LAB — MAGNESIUM: Magnesium: 2.7 mg/dL — ABNORMAL HIGH (ref 1.7–2.4)

## 2019-03-04 MED ORDER — JEVITY 1.2 CAL PO LIQD
1000.0000 mL | ORAL | Status: DC
  Filled 2019-03-04: qty 1000

## 2019-03-04 MED ORDER — OSMOLITE 1.5 CAL PO LIQD: 1000.0000 mL | ORAL | Status: DC

## 2019-03-04 MED ORDER — OSMOLITE 1.5 CAL PO LIQD
1000.0000 mL | ORAL | Status: DC
  Administered 2019-03-04: 21:00:00 1000 mL
  Filled 2019-03-04 (×2): qty 1000

## 2019-03-04 NOTE — Plan of Care (Signed)
Patient drank 2 Ensure and ate approximately 25% of lunch and supper meal this shift.  VSS.  Spoke with son [name] on the phone for update.  ICU nurse to come place [facility] as per MD order for tube feeding.

## 2019-03-04 NOTE — Progress Notes (Signed)
Patient drank apple juice, an entire Ensure and some applesauce with his meds.

## 2019-03-04 NOTE — Progress Notes (Signed)
Attempted to get patient up to use foot scales for weight.  Able to pull patient to his feet with 2 max assist but patient unable to move legs.  This RN was able to zero out bed for accurate bed weight before lying patient back down.

## 2019-03-04 NOTE — Progress Notes (Signed)
PROGRESS NOTE  [name] [id number] DOB: 13-Apr-1928   PCP: [name], MD  Patient is from: Home  DOA: 02/22/2019 LOS: 82  Brief Narrative / Interim history:  86 y.o. male with a PMH of advanced dementia, hypertension, hyperlipidemia, GERD and BPH who was admitted 02/22/2019 with a chief complaint of generalized weakness progressive over several weeks.  In the ED, he had low-grade temperature but was noted to be hypoxic with a room air saturation of 84% associated with tachycardia.  Chest x-ray was clear and CT was negative for acute infarct.  CT of the abdomen and pelvis showed a splenic mass and retroperitoneal adenopathy along with patchy bilateral airspace disease consistent with viral pneumonia.  COVID-19 testing was positive as was RPR and HIV testing.  T. Pallidum positive. However, RPR titer 1:1. Unclear if this was treated or untreated.  Confirmatory HIV test negative.  Infectious disease, IR and oncology consulted.  He underwent lumbar puncture on 02/28/2018.  Cytology not indicated for specific etiology.  CSF protein elevated to 100.  Gram stain and cultures negative so far.  VDRL pending.  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  Patient had biopsy of supraclavicular lymph nodes on 02/27/2018.  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  Subjective: Had an episode of agitation requiring Haldol last night.  Sleepy this morning.  States "I am all right". Mumbles some words but difficult to understand.  Does not follows command.  No apparent focal neuro deficit.  Does not appear to be in distress.  Objective: Vitals:   03/02/19 1341 03/03/19 0454 03/03/19 1259 03/04/19 0504  BP: 139/70 140/73 131/78 110/61  Pulse: 67 78 77 70  Resp: 16 18 14 18   Temp: 97.7 F (36.5 C) 98.5 F (36.9 C) 98.1 F (36.7 C) (!) 97 F (36.1 C)  TempSrc: Oral Oral Oral   SpO2: 96% 97% 96%  94%  Weight:      Height:        Intake/Output Summary (Last 24 hours) at 03/04/2019 1034 Last data filed at 03/04/2019 0526 Gross per 24 hour  Intake 2288.38 ml  Output 2650 ml  Net -361.62 ml   Filed Weights   02/22/19 2056  Weight: 71.5 kg    Examination:  GENERAL: Frail and chronically ill-appearing.  No distress. HEENT: Mucous membranes slightly dry.  Hearing grossly intact. NECK: Supple.  No apparent JVD.  RESP:  No IWOB.  Fair aeration bilaterally. CVS:  RRR. Heart sounds normal.  ABD/GI/GU: Bowel sounds present. Soft. Non tender.  MSK/EXT:  Moves extremities. No apparent deformity. No edema.  SKIN: no apparent skin lesion or wound NEURO: Sleepy but wakes to voice.  Disoriented.  "I am all right".  Does not follows command.  No apparent focal neuro deficit. PSYCH: Sleepy.  Calm Procedures:  None  Assessment & Plan: Pneumonia due to COVID-19 infection: CT chest with bibasilar groundglass opacities.  Inflammatory markers started to improve.  On room air. Recent Labs    03/02/19 0255 03/03/19 0256 03/04/19 0418  DDIMER 2.58* 3.55* 3.43*  CRP 0.9 0.7 0.6  -Remdesivir 1/1-1/6 -Decadron 1/1>> 1/10 -Subcu Lovenox for VTE prophylaxis. 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  Ammonia slightly elevated at 36.  Thiamine and vitamin B12 normal.  COVID-19 and acute hospitalization could contribute. -Continue low-dose Seroquel at bedtime  -Haldol as needed for agitation -Delirium precautions -  Bowel regimen with lactulose -Treating-tertiary syphilis as below.  Possible neurosyphilis: RPR and T. Pallidum positive although low titer.  CSF protein elevated to 100.  Cytology not indicated for specific etiology.  CSF Gram stain and cultures negative so far.  CSF VDRL in process.  -ID started continuous penicillin G infusion on 1/8 for a total of 14 days.  PICC line placed on 1/9.  Large-cell  B-cell non-Hodgkin's lymphoma: Confirmed by supraclavicular lymph node biopsy on 1/6. Splenic mass/retroperitoneal right healer and left supraclavicular adenopathy likely due to lymphoma -Oncology on board.  Hypotension: Normotensive. -Hold Avapro  Glaucoma -Continue home eyedrops.  BPH: Not on medication at home. -Monitor urinary output.  Physical deconditioning/debility: -Supportive care.  Goal of care: Patient remains full code per family wish although this would pose more harm than benefit in light of patient's condition.  Palliative care following. -Appreciate palliative input-full code and full scope of care -Place NG tube or Cortrak for feeding    Moderate protein calorie malnutrition Nutrition Problem: Increased nutrient needs Etiology: acute illness(COVID-19)  Signs/Symptoms: estimated needs  Interventions: Ensure Enlive (each supplement provides 350kcal and 20 grams of protein), Magic cup, MVI   DVT prophylaxis: Subcu Lovenox Code Status: Full code Family Communication: Discussed with patient's son over the phone Disposition Plan: Remains inpatient due to encephalopathy and treatment of tertiary syphilis Consultants: IR, oncology, palliative care and infectious disease   Microbiology summarized: COVID-19 positive. Urine culture with multiple species.  Sch Meds:  Scheduled Meds: . vitamin C  500 mg Oral Daily  . aspirin EC  81 mg Oral Daily  . atorvastatin  5 mg Oral Daily  . brinzolamide  1 drop Both Eyes TID  . Chlorhexidine Gluconate Cloth  6 each Topical Daily  . Chlorhexidine Gluconate Cloth  6 each Topical Daily  . dexamethasone (DECADRON) injection  6 mg Intravenous Q24H  . donepezil  10 mg Oral QHS  . enoxaparin (LOVENOX) injection  40 mg Subcutaneous Daily  . feeding supplement (ENSURE ENLIVE)  237 mL Oral TID BM  . latanoprost  1 drop Both Eyes QHS  . multivitamin with minerals  1 tablet Oral Daily  . polyethylene glycol  17 g Oral Daily  .  QUEtiapine  25 mg Oral QHS  . senna-docusate  1 tablet Oral BID  . zinc sulfate  220 mg Oral Daily   Continuous Infusions: . penicillin g continuous IV infusion 12 Million Units (03/04/19 0524)   PRN Meds:.haloperidol lactate, lactulose **OR** lactulose, sodium chloride flush  Antimicrobials: Anti-infectives (From admission, onward)   Start     Dose/Rate Route Frequency Ordered Stop   03/02/19 1800  penicillin G potassium 12 Million Units in dextrose 5 % 500 mL continuous infusion     12 Million Units 41.7 mL/hr over 12 Hours Intravenous Every 12 hours 03/02/19 1514 03/16/19 1759   02/24/19 1000  remdesivir 100 mg in sodium chloride 0.9 % 100 mL IVPB     100 mg 200 mL/hr over 30 Minutes Intravenous Daily 02/23/19 1519 02/27/19 1120   02/23/19 1600  cefTRIAXone (ROCEPHIN) 1 g in sodium chloride 0.9 % 100 mL IVPB  Status:  Discontinued     1 g 200 mL/hr over 30 Minutes Intravenous Every 24 hours 02/22/19 1751 02/23/19 0818   02/23/19 1600  remdesivir 200 mg in sodium chloride 0.9% 250 mL IVPB     200 mg 580 mL/hr over 30 Minutes Intravenous Once 02/23/19 1519 02/23/19 1624   02/23/19 1500  azithromycin (ZITHROMAX) 500  mg in sodium chloride 0.9 % 250 mL IVPB  Status:  Discontinued     500 mg 250 mL/hr over 60 Minutes Intravenous Every 24 hours 02/22/19 1751 02/23/19 0818   02/22/19 1630  cefTRIAXone (ROCEPHIN) 1 g in sodium chloride 0.9 % 100 mL IVPB     1 g 200 mL/hr over 30 Minutes Intravenous  Once 02/22/19 1615 02/22/19 1729   02/22/19 1630  azithromycin (ZITHROMAX) 500 mg in sodium chloride 0.9 % 250 mL IVPB     500 mg 250 mL/hr over 60 Minutes Intravenous  Once 02/22/19 1615 02/22/19 1902       I have personally reviewed the following labs and images: CBC: Recent Labs  Lab 02/26/19 0340 02/27/19 0319 02/28/19 0311 03/01/19 0257 03/02/19 0255 03/03/19 0256  WBC 14.2* 9.0 8.8 11.5* 11.1* 12.2*  NEUTROABS 11.1*  --   --   --   --   --   HGB 13.7 12.5* 13.1 13.3 13.3  13.4  HCT 41.2 38.2* 41.0 40.4 41.2 41.3  MCV 92.6 92.9 95.6 93.1 94.7 94.9  PLT PLATELET CLUMPS NOTED ON SMEAR, UNABLE TO ESTIMATE PLATELET CLUMPING, SUGGEST RECOLLECTION OF SAMPLE IN CITRATE TUBE. PLATELET CLUMPING, SUGGEST RECOLLECTION OF SAMPLE IN CITRATE TUBE. 153 203 PLATELET CLUMPS NOTED ON SMEAR, COUNT APPEARS ADEQUATE   BMP &GFR Recent Labs  Lab 02/28/19 0311 03/01/19 0257 03/02/19 0255 03/03/19 0256 03/04/19 0418  NA 138 136 136 136 137  K 3.9 3.7 3.9 4.0 4.4  CL 107 105 104 105 104  CO2 21* 22 22 22 26   GLUCOSE 123* 114* 111* 117* 139*  BUN 22 32* 29* 34* 24*  CREATININE 0.53* 0.56* 0.59* 0.65 0.60*  CALCIUM 8.5* 8.5* 8.5* 8.6* 8.5*  MG 2.5* 2.5* 2.5* 2.5* 2.7*   Estimated Creatinine Clearance: 57.6 mL/min (A) (by C-G formula based on SCr of 0.6 mg/dL (L)). Liver & Pancreas: Recent Labs  Lab 02/26/19 0340 03/01/19 0257 03/02/19 0255 03/03/19 0256 03/04/19 0418  AST 74* 54* 45* 42* 39  ALT 39 43 39 38 39  ALKPHOS 63 64 66 69 70  BILITOT 0.6 1.0 1.1 1.0 0.9  PROT 6.2* 5.7* 5.8* 6.0* 5.9*  ALBUMIN 2.9* 2.5* 2.6* 2.7* 2.6*   No results for input(s): LIPASE, AMYLASE in the last 168 hours. Recent Labs  Lab 03/01/19 0257  AMMONIA 36*   Diabetic: No results for input(s): HGBA1C in the last 72 hours. No results for input(s): GLUCAP in the last 168 hours. Cardiac Enzymes: No results for input(s): CKTOTAL, CKMB, CKMBINDEX, TROPONINI in the last 168 hours. No results for input(s): PROBNP in the last 8760 hours. Coagulation Profile: Recent Labs  Lab 02/26/19 0340 02/27/19 0319 03/01/19 0257  INR 1.2 1.2 1.2   Thyroid Function Tests: No results for input(s): TSH, T4TOTAL, FREET4, T3FREE, THYROIDAB in the last 72 hours. Lipid Profile: No results for input(s): CHOL, HDL, LDLCALC, TRIG, CHOLHDL, LDLDIRECT in the last 72 hours. Anemia Panel: No results for input(s): VITAMINB12, FOLATE, FERRITIN, TIBC, IRON, RETICCTPCT in the last 72 hours. Urine analysis:     Component Value Date/Time   COLORURINE YELLOW 02/22/2019 1226   APPEARANCEUR CLEAR 02/22/2019 1226   LABSPEC 1.019 02/22/2019 1226   PHURINE 7.0 02/22/2019 1226   GLUCOSEU NEGATIVE 02/22/2019 1226   HGBUR NEGATIVE 02/22/2019 1226   BILIRUBINUR NEGATIVE 02/22/2019 1226   BILIRUBINUR negative 02/05/2019 1610   KETONESUR NEGATIVE 02/22/2019 1226   PROTEINUR NEGATIVE 02/22/2019 1226   UROBILINOGEN 0.2 02/05/2019 1610   UROBILINOGEN 0.2  03/21/2007 0944   NITRITE NEGATIVE 02/22/2019 [place] 02/22/2019 1226   Sepsis Labs: Invalid input(s): PROCALCITONIN, Joes  Microbiology: Recent Results (from the past 240 hour(s))  Urine culture     Status: Abnormal   Collection Time: 02/22/19 12:26 PM   Specimen: Urine, Clean Catch  Result Value Ref Range Status   Specimen Description   Final    URINE, CLEAN CATCH Performed at [facility], [place] [address], [place], [place] [address]    Special Requests   Final    NONE Performed at [facility], [place] [address], [place], [place] [address]    Culture MULTIPLE SPECIES PRESENT, SUGGEST RECOLLECTION (A)  Final   Report Status 02/23/2019 FINAL  Final  SARS CORONAVIRUS 2 (TAT 6-24 HRS) Nasopharyngeal Nasopharyngeal Swab     Status: Abnormal   Collection Time: 02/22/19  5:06 PM   Specimen: Nasopharyngeal Swab  Result Value Ref Range Status   SARS Coronavirus 2 POSITIVE (A) NEGATIVE Final    Comment: RESULT CALLED TO, READ BACK BY AND VERIFIED WITH: B. COLES,RN 2343 02/22/2019 T. TYSOR (NOTE) SARS-CoV-2 target nucleic acids are DETECTED. The SARS-CoV-2 RNA is generally detectable in upper and lower respiratory specimens during the acute phase of infection. Positive results are indicative of the presence of SARS-CoV-2 RNA. Clinical correlation with patient history and other diagnostic information is  necessary to determine patient infection status. Positive results do not rule out  bacterial infection or co-infection with other viruses.  The expected result is Negative. Fact Sheet for Patients: SugarRoll.be Fact Sheet for Healthcare Providers: https://www.woods-mathews.com/ This test is not yet approved or cleared by the [place] FDA and  has been authorized for detection and/or diagnosis of SARS-CoV-2 by FDA under an Emergency Use Authorization (EUA). This EUA will remain  in effect (meaning this test can be used) for th e duration of the COVID-19 declaration under Section 564(b)(1) of the Act, 21 U.S.C. section 360bbb-3(b)(1), unless the authorization is terminated or revoked sooner. Performed at [facility], [place] [address] [place], [place] [address]   Anaerobic culture     Status: None (Preliminary result)   Collection Time: 03/01/19  3:17 PM   Specimen: PATH Cytology CSF; Cerebrospinal Fluid  Result Value Ref Range Status   Specimen Description   Final    CSF Performed at [facility], [place] [address] [place], [place] [address]    Special Requests   Final    NONE Performed at [facility], [place] [address] [place], [place] [address]    Culture   Final    NO GROWTH 2 DAYS Performed at [place] [address] [place], [place] [address]    Report Status PENDING  Incomplete  CSF culture     Status: None (Preliminary result)   Collection Time: 03/01/19  3:17 PM   Specimen: PATH Cytology CSF; Cerebrospinal Fluid  Result Value Ref Range Status   Specimen Description   Final    CSF Performed at [facility], [place] [address] [place], [place] [address]    Special Requests   Final    NONE Performed at [facility], [place] [address] [place], [place] [address]    Gram Stain   Final    NO WBC SEEN NO ORGANISMS SEEN CYTOSPIN SMEAR Gram Stain Report Called to,Read Back By and Verified With: EDWARDS,J. RN  @1713  ON 01.07.2021 BY COHEN,K Performed  at [facility], [place] [address], [place], [place] [address]    Culture   Final    NO GROWTH 2 DAYS Performed at [place] [address] [place], [place] [address]    Report Status PENDING  Incomplete    Radiology Studies: No results found.   Yuette Putnam T. Baylis  If 7PM-7AM, please contact night-coverage [email or web address] Password [facility] 03/04/2019, 10:34 AM

## 2019-03-05 LAB — CSF CULTURE W GRAM STAIN
Culture: NO GROWTH
Gram Stain: NONE SEEN

## 2019-03-05 LAB — AMMONIA: Ammonia: 14 umol/L (ref 9–35)

## 2019-03-05 LAB — GLUCOSE, CAPILLARY
Glucose-Capillary: 157 mg/dL — ABNORMAL HIGH (ref 70–99)
Glucose-Capillary: 161 mg/dL — ABNORMAL HIGH (ref 70–99)
Glucose-Capillary: 91 mg/dL (ref 70–99)
Glucose-Capillary: 105 mg/dL — ABNORMAL HIGH (ref 70–99)
Glucose-Capillary: 120 mg/dL — ABNORMAL HIGH (ref 70–99)

## 2019-03-05 LAB — CYTOLOGY - NON PAP

## 2019-03-05 LAB — VDRL, CSF: VDRL Quant, CSF: NONREACTIVE

## 2019-03-05 LAB — PHOSPHORUS: Phosphorus: 2.4 mg/dL — ABNORMAL LOW (ref 2.5–4.6)

## 2019-03-05 LAB — BASIC METABOLIC PANEL
Anion gap: 8 (ref 5–15)
BUN: 33 mg/dL — ABNORMAL HIGH (ref 8–23)
CO2: 26 mmol/L (ref 22–32)
Calcium: 8.6 mg/dL — ABNORMAL LOW (ref 8.9–10.3)
Chloride: 101 mmol/L (ref 98–111)
Creatinine, Ser: 0.68 mg/dL (ref 0.61–1.24)
GFR calc Af Amer: >60 mL/min (ref >60)
GFR calc non Af Amer: >60 mL/min (ref >60)
Glucose, Bld: 159 mg/dL — ABNORMAL HIGH (ref 70–99)
Potassium: 4.3 mmol/L (ref 3.5–5.1)
Sodium: 135 mmol/L (ref 135–145)

## 2019-03-05 LAB — D-DIMER, QUANTITATIVE: D-Dimer, Quant: 3.75 ug/mL-FEU — ABNORMAL HIGH (ref 0.00–0.50)

## 2019-03-05 LAB — MAGNESIUM: Magnesium: 2.5 mg/dL — ABNORMAL HIGH (ref 1.7–2.4)

## 2019-03-05 MED ORDER — OSMOLITE 1.5 CAL PO LIQD
1000.0000 mL | ORAL | Status: DC
  Administered 2019-03-05 – 2019-03-07 (×3): 1000 mL
  Filled 2019-03-05 (×3): qty 1000

## 2019-03-05 NOTE — Progress Notes (Signed)
Nutrition Follow-up  RD working remotely.   DOCUMENTATION CODES:   Not applicable  INTERVENTION:  - will adjust TF regimen to meet 75% of estimated needs. - Osmolite 1.5 @ 60 ml/hr x15 hours/day (1800-0900) which will provide 1350 kcal, 56 grams protein, and 686 ml free water.  - continue Ensure Enlive TID and to encourage PO intakes.    NUTRITION DIAGNOSIS:   Increased nutrient needs related to acute illness(COVID-19) as evidenced by estimated needs. -ongoing  GOAL:   Patient will meet greater than or equal to 90% of their needs -to be met with TF regimen + PO intakes  MONITOR:   TF tolerance, PO intake, Labs, Weight trends  REASON FOR ASSESSMENT:   Consult Enteral/tube feeding initiation and management  ASSESSMENT:   86 y.o. male with medical history of advanced dementia, HTN, hyperlipidemia, GERD, and BPH. He was admitted on 12/31 with generalized weakness, progressive over several weeks.  In the ED, he had low-grade temperature and was noted to be hypoxic with a room air saturation of 84% associated with tachycardia.  CXR was clear and CT was negative for acute infarct.  CT abdomen/pelvis showed a splenic mass and retroperitoneal adenopathy along with patchy bilateral airspace disease consistent with viral pna.  COVID-19 testing was positive as was RPR and HIV testing (awaiting confirmatory tests).  Patient is a/o to self only. He is on Dysphagia 3, thin liquids diet and flow sheet documentation indicates that he recently consumed the following at meals:  1/8- 25% of lunch and 40% of dinner 1/9- 0% of breakfast and 25% of lunch and dinner 1/10- 0% of breakfast, 25% of lunch, and 35% of dinner  Ensure Enlive was ordered TID on 1/9 and patient has accepted all bottles of supplement offered since that time.   NGT was placed in L nare yesterday and order is in place for Osmolite 1.5 @ 55 ml/hr, but rate is increasing to this goal rate so he is currently receiving Osmolite  1.5 @ 40 ml/hr. Current rate is providing 1440 kcal, 60 grams protein, and 731 ml free water. Will adjust TF regimen as outlined above in an effort to optimize PO intake.   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 Medications reviewed; 500 mg ascorbic acid/day, daily multivitamin with minerals, 1 packet miralax/day, 1 tablet senokot BID, 220 mg zinc sulfate/day.     NUTRITION - FOCUSED PHYSICAL EXAM:  unable to complete for COVID+ patient  Diet Order:   Diet Order            Diet regular Room service appropriate? Yes; Fluid consistency: Thin  Diet effective now              EDUCATION NEEDS:   No education needs have been identified at this time  Skin:  Skin Assessment: Reviewed RN Assessment  Last BM:  1/9  Height:   Ht Readings from Last 1 Encounters:  02/22/19 5' 6" (1.676 m)    Weight:   Wt Readings from Last 1 Encounters:  03/05/19 68.5 kg  Ideal Body Weight:  64.5 kg  BMI:  Body mass index is 24.37 kg/m.  Estimated Nutritional Needs:   Kcal:  1790-2000 kcal  Protein:  80-90 grams  Fluid:  >/= 2 L/day     Jarome Matin, MS, RD, LDN, [facility] Inpatient Clinical Dietitian Pager # [phone] After hours/weekend pager # [phone]

## 2019-03-05 NOTE — Progress Notes (Signed)
PROGRESS NOTE  [name] [id number] DOB: 1928/10/30   PCP: [name], MD  Patient is from: Home  DOA: 02/22/2019 LOS: 17  Brief Narrative / Interim history:  86 y.o. male with a PMH of advanced dementia, hypertension, hyperlipidemia, GERD and BPH who was admitted 02/22/2019 with a chief complaint of generalized weakness progressive over several weeks.  In the ED, he had low-grade temperature but was noted to be hypoxic with a room air saturation of 84% associated with tachycardia.  Chest x-ray was clear and CT was negative for acute infarct.  CT of the abdomen and pelvis showed a splenic mass and retroperitoneal adenopathy along with patchy bilateral airspace disease consistent with viral pneumonia.  COVID-19 testing was positive as was RPR and HIV testing.  T. Pallidum positive. However, RPR titer 1:1. Unclear if this was treated or untreated.  Confirmatory HIV test negative.  Infectious disease, IR and oncology consulted.  He underwent lumbar puncture on 02/28/2018.  Cytology not indicated for specific etiology.  CSF protein elevated to 100.  Gram stain and cultures negative so far.  VDRL pending.  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  Patient had biopsy of supraclavicular lymph nodes on 02/27/2018.  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  Subjective: No major events overnight or this morning.  Awake but disoriented.  Does not appear to be in distress.  He only responds "I am alright.  I am ready".  Not able to elaborate.  Barely follows command.  Objective: Vitals:   03/04/19 1142 03/04/19 2102 03/05/19 0500 03/05/19 0505  BP: 110/65 116/60  112/62  Pulse: 79 92  82  Resp: 18 17  15   Temp: 97.7 F (36.5 C) 98.5 F (36.9 C)  98.2 F (36.8 C)  TempSrc: Oral Oral  Oral  SpO2: 99% 96%  96%  Weight:   68.5 kg   Height:        Intake/Output Summary (Last 24 hours)  at 03/05/2019 1024 Last data filed at 03/05/2019 0600 Gross per 24 hour  Intake 2043.5 ml  Output 975 ml  Net 1068.5 ml   Filed Weights   02/22/19 2056 03/04/19 1138 03/05/19 0500  Weight: 71.5 kg 67.1 kg 68.5 kg    Examination:  GENERAL: Frail and chronically ill-appearing.  No distress. HEENT: MMM.  Vision and hearing grossly intact.  Feeding tube. NECK: Supple.  No apparent JVD.  RESP:  No IWOB.  Fair aeration bilaterally. CVS:  RRR. Heart sounds normal.  ABD/GI/GU: Bowel sounds present. Soft. Non tender.  MSK/EXT:  Moves extremities. No apparent deformity. No edema.  SKIN: no apparent skin lesion or wound NEURO: Awake but not oriented.  "I am alright.  I am ready".  Barely follows command.  No apparent focal neuro deficit. PSYCH: Disoriented but calm.  Procedures:  None  Assessment & Plan: Pneumonia due to COVID-19 infection: CT chest with bibasilar groundglass opacities.  CRP normalized.  D-dimer remains elevated.  No respiratory distress.  Remained stable on room air. Recent Labs    03/03/19 0256 03/04/19 0418 03/05/19 0430  DDIMER 3.55* 3.43* 3.75*  CRP 0.7 0.6  --   -Remdesivir 1/1-1/6 -Decadron 1/1>> 1/10 -Subcu Lovenox for VTE prophylaxis. 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  Ammonia 36>14.  Thiamine and vitamin B12 normal.  COVID-19 and acute hospitalization could contribute. -Continue low-dose Seroquel at bedtime  -  Haldol as needed for agitation -Delirium precautions -Bowel regimen with lactulose as needed -Treating-tertiary syphilis as below.  Possible neurosyphilis: RPR and T. Pallidum positive although low titer.  CSF protein elevated to 100.  Cytology not indicated for specific etiology.  CSF Gram stain and cultures negative so far.  CSF VDRL in process.  -ID started continuous penicillin G infusion on 1/8 for a total of 14 days.  PICC line placed on  1/9.  Large-cell B-cell non-Hodgkin's lymphoma: Confirmed by supraclavicular lymph node biopsy on 1/6. Splenic mass/retroperitoneal right healer and left supraclavicular adenopathy likely due to lymphoma -Oncology on board.  Hypotension: Normotensive. -Continue holding Avapro.  Glaucoma -Continue home eyedrops.  BPH: Not on medication at home. -Monitor urinary output.  Physical deconditioning/debility: -Supportive care.  Goal of care: Patient remains full code per family wish although this would pose more harm than benefit in light of patient's condition.  Palliative care following. -Appreciate palliative input-full code and full scope of care -May need PEG tube if family wish to continue full scope after discussion with oncology    Moderate protein calorie malnutrition -Continue to feed via NG tube Nutrition Problem: Increased nutrient needs Etiology: acute illness(COVID-19)  Signs/Symptoms: estimated needs  Interventions: Ensure Enlive (each supplement provides 350kcal and 20 grams of protein), Magic cup, MVI   DVT prophylaxis: Subcu Lovenox Code Status: Full code Family Communication: Discussed with patient's son over the phone on 1/10 Disposition Plan: Remains inpatient due to encephalopathy treatment of tertiary syphilis, and oncology clearance Consultants: IR (off), ID (off), oncology, palliative care    Microbiology summarized: COVID-19 positive. Urine culture with multiple species.  Sch Meds:  Scheduled Meds: . vitamin C  500 mg Oral Daily  . aspirin EC  81 mg Oral Daily  . atorvastatin  5 mg Oral Daily  . brinzolamide  1 drop Both Eyes TID  . Chlorhexidine Gluconate Cloth  6 each Topical Daily  . Chlorhexidine Gluconate Cloth  6 each Topical Daily  . donepezil  10 mg Oral QHS  . enoxaparin (LOVENOX) injection  40 mg Subcutaneous Daily  . feeding supplement (ENSURE ENLIVE)  237 mL Oral TID BM  . latanoprost  1 drop Both Eyes QHS  . multivitamin with  minerals  1 tablet Oral Daily  . polyethylene glycol  17 g Oral Daily  . QUEtiapine  25 mg Oral QHS  . senna-docusate  1 tablet Oral BID  . zinc sulfate  220 mg Oral Daily   Continuous Infusions: . feeding supplement (OSMOLITE 1.5 CAL) 40 mL/hr at 03/05/19 0536  . penicillin g continuous IV infusion 12 Million Units (03/05/19 0938)   PRN Meds:.haloperidol lactate, lactulose **OR** lactulose, sodium chloride flush  Antimicrobials: Anti-infectives (From admission, onward)   Start     Dose/Rate Route Frequency Ordered Stop   03/02/19 1800  penicillin G potassium 12 Million Units in dextrose 5 % 500 mL continuous infusion     12 Million Units 41.7 mL/hr over 12 Hours Intravenous Every 12 hours 03/02/19 1514 03/16/19 1759   02/24/19 1000  remdesivir 100 mg in sodium chloride 0.9 % 100 mL IVPB     100 mg 200 mL/hr over 30 Minutes Intravenous Daily 02/23/19 1519 02/27/19 1120   02/23/19 1600  cefTRIAXone (ROCEPHIN) 1 g in sodium chloride 0.9 % 100 mL IVPB  Status:  Discontinued     1 g 200 mL/hr over 30 Minutes Intravenous Every 24 hours 02/22/19 1751 02/23/19 0818   02/23/19 1600  remdesivir 200 mg in  sodium chloride 0.9% 250 mL IVPB     200 mg 580 mL/hr over 30 Minutes Intravenous Once 02/23/19 1519 02/23/19 1624   02/23/19 1500  azithromycin (ZITHROMAX) 500 mg in sodium chloride 0.9 % 250 mL IVPB  Status:  Discontinued     500 mg 250 mL/hr over 60 Minutes Intravenous Every 24 hours 02/22/19 1751 02/23/19 0818   02/22/19 1630  cefTRIAXone (ROCEPHIN) 1 g in sodium chloride 0.9 % 100 mL IVPB     1 g 200 mL/hr over 30 Minutes Intravenous  Once 02/22/19 1615 02/22/19 1729   02/22/19 1630  azithromycin (ZITHROMAX) 500 mg in sodium chloride 0.9 % 250 mL IVPB     500 mg 250 mL/hr over 60 Minutes Intravenous  Once 02/22/19 1615 02/22/19 1902       I have personally reviewed the following labs and images: CBC: Recent Labs  Lab 02/27/19 0319 02/28/19 0311 03/01/19 0257 03/02/19 0255  03/03/19 0256  WBC 9.0 8.8 11.5* 11.1* 12.2*  HGB 12.5* 13.1 13.3 13.3 13.4  HCT 38.2* 41.0 40.4 41.2 41.3  MCV 92.9 95.6 93.1 94.7 94.9  PLT PLATELET CLUMPING, SUGGEST RECOLLECTION OF SAMPLE IN CITRATE TUBE. PLATELET CLUMPING, SUGGEST RECOLLECTION OF SAMPLE IN CITRATE TUBE. 153 203 PLATELET CLUMPS NOTED ON SMEAR, COUNT APPEARS ADEQUATE   BMP &GFR Recent Labs  Lab 03/01/19 0257 03/02/19 0255 03/03/19 0256 03/04/19 0418 03/05/19 0430  NA 136 136 136 137 135  K 3.7 3.9 4.0 4.4 4.3  CL 105 104 105 104 101  CO2 22 22 22 26 26   GLUCOSE 114* 111* 117* 139* 159*  BUN 32* 29* 34* 24* 33*  CREATININE 0.56* 0.59* 0.65 0.60* 0.68  CALCIUM 8.5* 8.5* 8.6* 8.5* 8.6*  MG 2.5* 2.5* 2.5* 2.7* 2.5*  PHOS  --   --   --   --  2.4*   Estimated Creatinine Clearance: 57.6 mL/min (by C-G formula based on SCr of 0.68 mg/dL). Liver & Pancreas: Recent Labs  Lab 03/01/19 0257 03/02/19 0255 03/03/19 0256 03/04/19 0418  AST 54* 45* 42* 39  ALT 43 39 38 39  ALKPHOS 64 66 69 70  BILITOT 1.0 1.1 1.0 0.9  PROT 5.7* 5.8* 6.0* 5.9*  ALBUMIN 2.5* 2.6* 2.7* 2.6*   No results for input(s): LIPASE, AMYLASE in the last 168 hours. Recent Labs  Lab 03/01/19 0257 03/05/19 0400  AMMONIA 36* 14   Diabetic: No results for input(s): HGBA1C in the last 72 hours. Recent Labs  Lab 03/04/19 2057 03/04/19 2348 03/05/19 0459 03/05/19 0806  GLUCAP 94 100* 157* 161*   Cardiac Enzymes: No results for input(s): CKTOTAL, CKMB, CKMBINDEX, TROPONINI in the last 168 hours. No results for input(s): PROBNP in the last 8760 hours. Coagulation Profile: Recent Labs  Lab 02/27/19 0319 03/01/19 0257  INR 1.2 1.2   Thyroid Function Tests: No results for input(s): TSH, T4TOTAL, FREET4, T3FREE, THYROIDAB in the last 72 hours. Lipid Profile: No results for input(s): CHOL, HDL, LDLCALC, TRIG, CHOLHDL, LDLDIRECT in the last 72 hours. Anemia Panel: No results for input(s): VITAMINB12, FOLATE, FERRITIN, TIBC, IRON,  RETICCTPCT in the last 72 hours. Urine analysis:    Component Value Date/Time   COLORURINE YELLOW 02/22/2019 1226   APPEARANCEUR CLEAR 02/22/2019 1226   LABSPEC 1.019 02/22/2019 1226   PHURINE 7.0 02/22/2019 1226   GLUCOSEU NEGATIVE 02/22/2019 1226   HGBUR NEGATIVE 02/22/2019 1226   BILIRUBINUR NEGATIVE 02/22/2019 1226   BILIRUBINUR negative 02/05/2019 [place] 02/22/2019 1226  PROTEINUR NEGATIVE 02/22/2019 1226   UROBILINOGEN 0.2 02/05/2019 1610   UROBILINOGEN 0.2 03/21/2007 0944   NITRITE NEGATIVE 02/22/2019 1226   [place]ts 02/22/2019 1226   Sepsis Labs: Invalid input(s): PROCALCITONIN, [place]  Microbiology: Recent Results (from the past 240 hour(s))  Anaerobic culture     Status: None (Preliminary result)   Collection Time: 03/01/19  3:17 PM   Specimen: PATH Cytology CSF; Cerebrospinal Fluid  Result Value Ref Range Status   Specimen Description   Final    CSF Performed at [facility], [place] [address], [place], [place] [address]    Special Requests   Final    NONE Performed at [facility], [place] [address], [place], [place] [address]    Culture   Final    NO GROWTH 2 DAYS Performed at [place] [address] [place], [place] [address]    Report Status PENDING  Incomplete  CSF culture     Status: None   Collection Time: 03/01/19  3:17 PM   Specimen: PATH Cytology CSF; Cerebrospinal Fluid  Result Value Ref Range Status   Specimen Description   Final    CSF Performed at [facility], [place] [address], [place], [place] [address]    Special Requests   Final    NONE Performed at [facility], [place] [address] [place], [place] [address]    Gram Stain   Final    NO WBC SEEN NO ORGANISMS SEEN CYTOSPIN SMEAR Gram Stain Report Called to,Read Back By and Verified With: EDWARDS,J. RN @1713  ON 01.07.2021 BY COHEN,K Performed at [facility], [place] [address], [place], [place] [address]    Culture   Final    NO GROWTH 3 DAYS Performed at [facility], [place] [address] [place],  [address]    Report Status 03/05/2019 FINAL  Final    Radiology Studies: DG Abd 1 View  Result Date: 03/04/2019 CLINICAL DATA:  NG tube placement EXAM: ABDOMEN - 1 VIEW COMPARISON:  08/06/2005 FINDINGS: Feeding tube coils in the stomach with the tip in the fundus. Nonobstructive bowel gas pattern. Visualized lungs clear. IMPRESSION: Feeding tube coils in the stomach with the tip in the fundus. Electronically Signed   By: Rolm Baptise M.D.   On: 03/04/2019 19:27     Svetlana Bagby T. [place]  If 7PM-7AM, please contact night-coverage [email or web address] Password [facility] 03/05/2019, 10:24 AM

## 2019-03-05 NOTE — Progress Notes (Signed)
Pt rate increased to 29ml/hr. Pt ate all chocolate pudding and 3 bites of cream of wheat, 1/4 cup of cranberry juice. Pt stated he had enough, no more. Wil cont to monitor intake. SRP, RN

## 2019-03-05 NOTE — Plan of Care (Signed)
  Problem: Health Behavior/Discharge Planning: Goal: Ability to manage health-related needs will improve Outcome: Progressing   Problem: Clinical Measurements: Goal: Ability to maintain clinical measurements within normal limits will improve Outcome: Progressing   

## 2019-03-05 NOTE — Plan of Care (Signed)
  Problem: Elimination: Goal: Will not experience complications related to bowel motility Outcome: Progressing Goal: Will not experience complications related to urinary retention Outcome: Progressing   Problem: Safety: Goal: Ability to remain free from injury will improve Outcome: Progressing   

## 2019-03-05 NOTE — Progress Notes (Signed)
Nurse tech reports pt ate 30% of lunch, ate all meat and 1/4 cup of juice. Tube feeding infusing at 4ml per hour, tol well will cont to monitor. SRP, RN

## 2019-03-05 NOTE — Progress Notes (Signed)
I discussed the diagnosis of diffuse large cell B-cell non-Hodgkin's lymphoma with the patient's son [name].  It would be useful if we could have a family get together.  The patient's daughter of course works at urology here in this building.  I suggested we could meet on Wednesday or Thursday between 5 and 530 which ever that is better for them.  I can then give them the information in writing and they can discuss it with their mother who does not have a computer at home and who is the healthcare power of attorney.  The preliminary diagnosis and treatment options are discussed in my prior note but I will repeat them here for convenience:   86 y/o Shea Stakes, [place] man with diffuse large-cell B-cell non-Hodgkin's lymphoma diagnosed by left supraclavicular lymph node biopsy 02/28/2019             (a) stage III             (b) IPI score 4 (age, stage, ECOG, LDH): predicts PFS3 of 55% if candidate for aggressive therapy              (c) DLCBC subtype: non-germ cell (activated B-cell subtype)             (d) MYC, BCL2 and BCL6 studies pending  (1) infectious concerns:             (a) COVID-19 positive             (b) workup for tertiary siphilys in process  Assessment: Standard of care for [facility] NHL is CHOP-Rituximab. In patients who can tolerate this aggressive treatment and have an IPI of 4 as Mr Lindor does the predicted disease free survival at 3 years is about 55%. If the patient cannot tolerate CHOP, bendamustine + rituximab or similar can be given in a palliative setting. Other palliative options with no chemotherapy include rituximab alone, rituximab + ibrutinib, ibrutinib alone, and lenalidomide.-- Note that the use of ibrutinib and lenolidomide in this setting is still being studied but appears to be of some use in patients with the non-germinal center cell subtype (as here).   [facility] and other pending gene studies if positive may indicate an especially aggressive "double hit" [facility]  NHL.  Obviously given the patient's age, dementia and comorbidities family input will be important in treatment decisions. I should be able to meet with the patient and family Monday 01/11 sometime between 5-5:30 PM and we can decide on options at that point.

## 2019-03-05 NOTE — Progress Notes (Signed)
Pt son [name] called and updated on NURSING plan of care. Son asked about food intake and feeding tube. Pt ate 25 % of breakfast will cont to monitor. SRP, RN

## 2019-03-05 NOTE — Progress Notes (Signed)
Patient drank a couple sips of ensure with medication. Patient also ate about half of applesauce with his medicine.

## 2019-03-05 NOTE — Progress Notes (Signed)
Physical Therapy Treatment Patient Details Name: [name] MRN: [id number] DOB: 09/29/1928 Today's Date: 03/05/2019    History of Present Illness Pt admitted from home Covid + and with hx of dementia.  Pt now additionally being treated for non-Hodgkin's lymphoma, encephalopathy, and possible neurosyphilis.  Pt now with NG feeding tube and per notes, may need PEG if family wants to pursue.    PT Comments    Pt continues to require mod -max A of 2 for transfers.  Pt was unable to tolerate standing as long today.  Limited due to weakness, dementia, and HOH.  Cont to progress as able.    Follow Up Recommendations  SNF     Equipment Recommendations  None recommended by PT    Recommendations for Other Services       Precautions / Restrictions Precautions Precautions: Fall Precaution Comments: NG feeding tube    Mobility  Bed Mobility Overal bed mobility: Needs Assistance Bed Mobility: Supine to Sit;Sit to Supine     Supine to sit: +2 for physical assistance;HOB elevated;Mod assist Sit to supine: +2 for physical assistance;HOB elevated;Mod assist   General bed mobility comments: assist for legs and to elevate trunk  Transfers Overall transfer level: Needs assistance Equipment used: 2 person hand held assist Transfers: Sit to/from Stand Sit to Stand: Mod assist;+2 physical assistance;+2 safety/equipment         General transfer comment: verbal and visual cues to lean forward; mod A x 2 for standing; only able to stand ~30 seconds with mod A x 2; unable to side step; performed sit to stnad x 3  Ambulation/Gait                 Stairs             Wheelchair Mobility    Modified Rankin (Stroke Patients Only)       Balance Overall balance assessment: Needs assistance Sitting-balance support: Feet supported;Bilateral upper extremity supported Sitting balance-Leahy Scale: Poor Sitting balance - Comments: posterior lean: requiring cues and 3 mins to  gait balance at EOB; cues to lean forward   Standing balance support: Bilateral upper extremity supported Standing balance-Leahy Scale: Zero                              Cognition Arousal/Alertness: Awake/alert Behavior During Therapy: WFL for tasks assessed/performed Overall Cognitive Status: History of cognitive impairments - at baseline                                 General Comments: Limited due to [facility] and dementia in negative pressure room with window unit.  Pt following ~75% of multi modal commands      Exercises      General Comments        Pertinent Vitals/Pain Pain Assessment: No/denies pain    Home Living                      Prior Function            PT Goals (current goals can now be found in the care plan section) Progress towards PT goals: Not progressing toward goals - comment(pt with additional medical diagnosis and now NG tube)    Frequency    Min 2X/week      PT Plan Current plan remains appropriate    Co-evaluation  AM-PAC PT "6 Clicks" Mobility   Outcome Measure  Help needed turning from your back to your side while in a flat bed without using bedrails?: A Lot Help needed moving from lying on your back to sitting on the side of a flat bed without using bedrails?: A Lot Help needed moving to and from a bed to a chair (including a wheelchair)?: Total Help needed standing up from a chair using your arms (e.g., wheelchair or bedside chair)?: Total Help needed to walk in hospital room?: Total Help needed climbing 3-5 steps with a railing? : Total 6 Click Score: 8    End of Session Equipment Utilized During Treatment: Gait belt Activity Tolerance: Patient tolerated treatment well Patient left: in bed;with call bell/phone within reach;with bed alarm set Nurse Communication: Mobility status PT Visit Diagnosis: Muscle weakness (generalized) (M62.81);Difficulty in walking, not elsewhere  classified (R26.2)     Time: [id number] PT Time Calculation (min) (ACUTE ONLY): 30 min  Charges:  $Therapeutic Activity: 23-37 mins                     Maggie Font, PT Acute Rehab Services Pager [phone] [facility] [phone] [facility] [phone]    Karlton Lemon 03/05/2019, 4:45 PM

## 2019-03-06 DIAGNOSIS — A529 Late syphilis, unspecified: Secondary | ICD-10-CM

## 2019-03-06 LAB — ANAEROBIC CULTURE

## 2019-03-06 LAB — GLUCOSE, CAPILLARY
Glucose-Capillary: 103 mg/dL — ABNORMAL HIGH (ref 70–99)
Glucose-Capillary: 118 mg/dL — ABNORMAL HIGH (ref 70–99)
Glucose-Capillary: 131 mg/dL — ABNORMAL HIGH (ref 70–99)
Glucose-Capillary: 82 mg/dL (ref 70–99)
Glucose-Capillary: 85 mg/dL (ref 70–99)
Glucose-Capillary: 98 mg/dL (ref 70–99)

## 2019-03-06 LAB — BASIC METABOLIC PANEL WITH GFR
Anion gap: 6 (ref 5–15)
BUN: 30 mg/dL — ABNORMAL HIGH (ref 8–23)
CO2: 28 mmol/L (ref 22–32)
Calcium: 8.5 mg/dL — ABNORMAL LOW (ref 8.9–10.3)
Chloride: 99 mmol/L (ref 98–111)
Creatinine, Ser: 0.59 mg/dL — ABNORMAL LOW (ref 0.61–1.24)
GFR calc Af Amer: >60 mL/min
GFR calc non Af Amer: >60 mL/min
Glucose, Bld: 102 mg/dL — ABNORMAL HIGH (ref 70–99)
Potassium: 4.1 mmol/L (ref 3.5–5.1)
Sodium: 133 mmol/L — ABNORMAL LOW (ref 135–145)

## 2019-03-06 LAB — PHOSPHORUS: Phosphorus: 2.7 mg/dL (ref 2.5–4.6)

## 2019-03-06 LAB — D-DIMER, QUANTITATIVE: D-Dimer, Quant: 3.41 ug/mL-FEU — ABNORMAL HIGH (ref 0.00–0.50)

## 2019-03-06 LAB — MAGNESIUM: Magnesium: 2.3 mg/dL (ref 1.7–2.4)

## 2019-03-06 NOTE — Telephone Encounter (Signed)
[name] with [facility] called stating that she was following-up on a request for orders on 02/21/19. Advised [name] that [name] was given the verbal orders by Anastasiya on 02/28/2018. [name] stated that [name] must have forgotten to document it and she appreciated the call back.

## 2019-03-06 NOTE — Progress Notes (Signed)
Contact/Airborne PPE discontinued, spoke with Elmyra Ricks in IP per protocol to review chart, chart review deferred to ID, MD made aware and order confirmed, PPE cancelled. SRP, RN

## 2019-03-06 NOTE — Progress Notes (Signed)
PROGRESS NOTE  [name] [id number] DOB: Sep 27, 1928   PCP: [name], MD  Patient is from: Home  DOA: 02/22/2019 LOS: 77  Brief Narrative / Interim history:  86 y.o. male with a PMH of advanced dementia, hypertension, hyperlipidemia, GERD and BPH who was admitted 02/22/2019 with a chief complaint of generalized weakness progressive over several weeks.  In the ED, he had low-grade temperature but was noted to be hypoxic with a room air saturation of 84% associated with tachycardia.  Chest x-ray was clear and CT was negative for acute infarct.  CT of the abdomen and pelvis showed a splenic mass and retroperitoneal adenopathy along with patchy bilateral airspace disease consistent with viral pneumonia.  COVID-19 testing was positive as was RPR and HIV testing.  T. Pallidum positive. However, RPR titer 1:1. Unclear if this was treated or untreated.  Confirmatory HIV test negative.  Infectious disease, IR and oncology consulted.  He underwent lumbar puncture on 02/28/2018.  Cytology not indicated for specific etiology.  CSF protein elevated to 100.  Gram stain and cultures negative so far.  VDRL pending.  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  Patient had biopsy of supraclavicular lymph nodes on 02/27/2018.  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  Subjective: No major events overnight or this morning.  Awake and alert but disoriented.  States "I am ready to go home".  Was able to raise his arms when asked.  No apparent distress or agitation.  Objective: Vitals:   03/05/19 1215 03/05/19 2029 03/06/19 0415 03/06/19 1221  BP: 125/69 (!) 123/96 117/66 125/78  Pulse: 79 88 81 81  Resp: 16 18 18 16   Temp: 97.7 F (36.5 C) 97.8 F (36.6 C) (!) 97.5 F (36.4 C) 97.8 F (36.6 C)  TempSrc: Oral Oral Oral Oral  SpO2: 97% 99% 99% 98%  Weight:   67.6 kg   Height:         Intake/Output Summary (Last 24 hours) at 03/06/2019 1409 Last data filed at 03/06/2019 0520 Gross per 24 hour  Intake 1322.05 ml  Output 1575 ml  Net -252.95 ml   Filed Weights   03/04/19 1138 03/05/19 0500 03/06/19 0415  Weight: 67.1 kg 68.5 kg 67.6 kg    Examination:  GENERAL: Frail and chronically ill-appearing.  No distress. HEENT: MMM.  Vision and hearing grossly intact.  NECK: Supple.  No apparent JVD.  RESP:  No IWOB.  Fair aeration bilaterally. CVS:  RRR. Heart sounds normal.  ABD/GI/GU: Bowel sounds present. Soft. Non tender.  MSK/EXT:  Moves extremities. No apparent deformity. No edema.  SKIN: no apparent skin lesion or wound NEURO: Alert and awake but disoriented.  Follows some command.  No apparent focal neuro deficit. PSYCH: Disoriented but calm.  No agitation.  Procedures:  None  Assessment & Plan: Pneumonia due to COVID-19 infection: CT chest with bibasilar groundglass opacities.  CRP normalized.  D-dimer remains elevated.  No respiratory distress.  Remained stable on room air. Recent Labs    03/04/19 0418 03/05/19 0430 03/06/19 0519  DDIMER 3.43* 3.75* 3.41*  CRP 0.6  --   --   -Remdesivir 1/1-1/6 -Decadron 1/1>> 1/10 -Subcu Lovenox for VTE prophylaxis. 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  Ammonia 36>14.  Thiamine and vitamin B12 normal.  COVID-19 and acute hospitalization could contribute. -Continue low-dose Seroquel at bedtime  -Haldol as  needed for agitation -Delirium precautions -Bowel regimen with lactulose as needed -Treating-tertiary syphilis as below.  Possible neurosyphilis: RPR and T. Pallidum positive although low titer.  CSF protein elevated to 100.  Cytology not indicated for specific etiology.  CSF Gram stain and cultures negative so far.  CSF VDRL in process.  -ID started continuous penicillin G infusion on 1/8 for a total of 14 days.  PICC line  placed on 1/9.  Large-cell B-cell non-Hodgkin's lymphoma: Confirmed by supraclavicular lymph node biopsy on 1/6. Splenic mass/retroperitoneal right healer and left supraclavicular adenopathy likely due to lymphoma -Oncology planning to discuss treatment options with family need to 1/13 or 1/14.  Hypotension: Normotensive. -Continue holding Avapro.  Glaucoma -Continue home eyedrops.  BPH: Not on medication at home. -Monitor urinary output.  Physical deconditioning/debility: -Supportive care.  Goal of care: Patient remains full code per family wish although this would pose more harm than benefit in light of patient's condition.  Palliative care following. -Appreciate palliative input-full code and full scope of care -May need PEG tube if family wish to continue full scope after discussion with oncology    Moderate protein calorie malnutrition -Continue to feed via NG tube Nutrition Problem: Increased nutrient needs Etiology: acute illness(COVID-19)  Signs/Symptoms: estimated needs  Interventions: Tube feeding, Magic cup, Ensure Enlive (each supplement provides 350kcal and 20 grams of protein)   DVT prophylaxis: Subcu Lovenox Code Status: Full code Family Communication: Discussed with patient's son over the phone on 1/10 Disposition Plan: Remains inpatient pending discussion about plan of treatment for his lymphoma which would determine his goal of care such as a decision to place PEG tube. Consultants: IR (off), ID (off), oncology, palliative care    Microbiology summarized: [id number] positive. Urine culture with multiple species.  Sch Meds:  Scheduled Meds: . vitamin C  500 mg Oral Daily  . aspirin EC  81 mg Oral Daily  . atorvastatin  5 mg Oral Daily  . brinzolamide  1 drop Both Eyes TID  . Chlorhexidine Gluconate Cloth  6 each Topical Daily  . Chlorhexidine Gluconate Cloth  6 each Topical Daily  . donepezil  10 mg Oral QHS  . enoxaparin (LOVENOX) injection  40 mg  Subcutaneous Daily  . feeding supplement (ENSURE ENLIVE)  237 mL Oral TID BM  . feeding supplement (OSMOLITE 1.5 CAL)  1,000 mL Per Tube Q24H  . latanoprost  1 drop Both Eyes QHS  . multivitamin with minerals  1 tablet Oral Daily  . polyethylene glycol  17 g Oral Daily  . QUEtiapine  25 mg Oral QHS  . senna-docusate  1 tablet Oral BID  . zinc sulfate  220 mg Oral Daily   Continuous Infusions: . penicillin g continuous IV infusion 12 Million Units (03/06/19 0834)   PRN Meds:.haloperidol lactate, lactulose **OR** lactulose, sodium chloride flush  Antimicrobials: Anti-infectives (From admission, onward)   Start     Dose/Rate Route Frequency Ordered Stop   03/02/19 1800  penicillin G potassium 12 Million Units in dextrose 5 % 500 mL continuous infusion     12 Million Units 41.7 mL/hr over 12 Hours Intravenous Every 12 hours 03/02/19 1514 03/16/19 1759   02/24/19 1000  remdesivir 100 mg in sodium chloride 0.9 % 100 mL IVPB     100 mg 200 mL/hr over 30 Minutes Intravenous Daily 02/23/19 1519 02/27/19 1120   02/23/19 1600  cefTRIAXone (ROCEPHIN) 1 g in sodium chloride 0.9 % 100 mL IVPB  Status:  Discontinued  1 g 200 mL/hr over 30 Minutes Intravenous Every 24 hours 02/22/19 1751 02/23/19 0818   02/23/19 1600  remdesivir 200 mg in sodium chloride 0.9% 250 mL IVPB     200 mg 580 mL/hr over 30 Minutes Intravenous Once 02/23/19 1519 02/23/19 1624   02/23/19 1500  azithromycin (ZITHROMAX) 500 mg in sodium chloride 0.9 % 250 mL IVPB  Status:  Discontinued     500 mg 250 mL/hr over 60 Minutes Intravenous Every 24 hours 02/22/19 1751 02/23/19 0818   02/22/19 1630  cefTRIAXone (ROCEPHIN) 1 g in sodium chloride 0.9 % 100 mL IVPB     1 g 200 mL/hr over 30 Minutes Intravenous  Once 02/22/19 1615 02/22/19 1729   02/22/19 1630  azithromycin (ZITHROMAX) 500 mg in sodium chloride 0.9 % 250 mL IVPB     500 mg 250 mL/hr over 60 Minutes Intravenous  Once 02/22/19 1615 02/22/19 1902       I have  personally reviewed the following labs and images: CBC: Recent Labs  Lab 02/28/19 0311 03/01/19 0257 03/02/19 0255 03/03/19 0256  WBC 8.8 11.5* 11.1* 12.2*  HGB 13.1 13.3 13.3 13.4  HCT 41.0 40.4 41.2 41.3  MCV 95.6 93.1 94.7 94.9  PLT PLATELET CLUMPING, SUGGEST RECOLLECTION OF SAMPLE IN CITRATE TUBE. 153 203 PLATELET CLUMPS NOTED ON SMEAR, COUNT APPEARS ADEQUATE   BMP &GFR Recent Labs  Lab 03/02/19 0255 03/03/19 0256 03/04/19 0418 03/05/19 0430 03/06/19 0519  NA 136 136 137 135 133*  K 3.9 4.0 4.4 4.3 4.1  CL 104 105 104 101 99  CO2 22 22 26 26 28   GLUCOSE 111* 117* 139* 159* 102*  BUN 29* 34* 24* 33* 30*  CREATININE 0.59* 0.65 0.60* 0.68 0.59*  CALCIUM 8.5* 8.6* 8.5* 8.6* 8.5*  MG 2.5* 2.5* 2.7* 2.5* 2.3  PHOS  --   --   --  2.4* 2.7   Estimated Creatinine Clearance: 57.6 mL/min (A) (by C-G formula based on SCr of 0.59 mg/dL (L)). Liver & Pancreas: Recent Labs  Lab 03/01/19 0257 03/02/19 0255 03/03/19 0256 03/04/19 0418  AST 54* 45* 42* 39  ALT 43 39 38 39  ALKPHOS 64 66 69 70  BILITOT 1.0 1.1 1.0 0.9  PROT 5.7* 5.8* 6.0* 5.9*  ALBUMIN 2.5* 2.6* 2.7* 2.6*   No results for input(s): LIPASE, AMYLASE in the last 168 hours. Recent Labs  Lab 03/01/19 0257 03/05/19 0400  AMMONIA 36* 14   Diabetic: No results for input(s): HGBA1C in the last 72 hours. Recent Labs  Lab 03/05/19 2026 03/06/19 0032 03/06/19 0411 03/06/19 0815 03/06/19 1219  GLUCAP 120* 131* 103* 98 85   Cardiac Enzymes: No results for input(s): CKTOTAL, CKMB, CKMBINDEX, TROPONINI in the last 168 hours. No results for input(s): PROBNP in the last 8760 hours. Coagulation Profile: Recent Labs  Lab 03/01/19 0257  INR 1.2   Thyroid Function Tests: No results for input(s): TSH, T4TOTAL, FREET4, T3FREE, THYROIDAB in the last 72 hours. Lipid Profile: No results for input(s): CHOL, HDL, LDLCALC, TRIG, CHOLHDL, LDLDIRECT in the last 72 hours. Anemia Panel: No results for input(s):  VITAMINB12, FOLATE, FERRITIN, TIBC, IRON, RETICCTPCT in the last 72 hours. Urine analysis:    Component Value Date/Time   COLORURINE YELLOW 02/22/2019 1226   APPEARANCEUR CLEAR 02/22/2019 1226   LABSPEC 1.019 02/22/2019 1226   PHURINE 7.0 02/22/2019 1226   GLUCOSEU NEGATIVE 02/22/2019 1226   HGBUR NEGATIVE 02/22/2019 [facility] 02/22/2019 1226   BILIRUBINUR negative 02/05/2019  [facility] 02/22/2019 [facility] 02/22/2019 1226   UROBILINOGEN 0.2 02/05/2019 1610   UROBILINOGEN 0.2 03/21/2007 0944   NITRITE NEGATIVE 02/22/2019 [facility] 02/22/2019 1226   Sepsis Labs: Invalid input(s): PROCALCITONIN, [place]  Microbiology: Recent Results (from the past 240 hour(s))  Anaerobic culture     Status: None   Collection Time: 03/01/19  3:17 PM   Specimen: PATH Cytology CSF; Cerebrospinal Fluid  Result Value Ref Range Status   Specimen Description   Final    CSF Performed at [facility], [address], [place], [place] [address]    Special Requests   Final    NONE Performed at [facility], [place] [address] [place], [place] [address]    Culture   Final    NO ANAEROBES ISOLATED Performed at [facility], [place] [address] [place], [place] [address]    Report Status 03/06/2019 FINAL  Final  CSF culture     Status: None   Collection Time: 03/01/19  3:17 PM   Specimen: PATH Cytology CSF; Cerebrospinal Fluid  Result Value Ref Range Status   Specimen Description   Final    CSF Performed at [facility], [place] [address] [place], [place] [address]    Special Requests   Final    NONE Performed at [facility], [place] [address] [place], [place] [address]    Gram Stain   Final    NO WBC SEEN NO ORGANISMS SEEN CYTOSPIN SMEAR Gram Stain Report Called to,Read Back By and Verified With: EDWARDS,J. RN @1713  ON 01.07.2021 BY  COHEN,K Performed at [facility], [place] [address], [place], [place] [address]    Culture   Final    NO GROWTH 3 DAYS Performed at [facility], [place] [address] [place], [place] [address]    Report Status 03/05/2019 FINAL  Final    Radiology Studies: No results found.   Kylieann Eagles T. Hornick  If 7PM-7AM, please contact night-coverage [email or web address] Password [facility] 03/06/2019, 2:09 PM

## 2019-03-06 NOTE — Progress Notes (Signed)
Tube feeding off, pt ate small bites of breakfast and ice with meds 20%. Ensure at bedside will enc intake , pt is feeling full currently. Pt resting without distress. SRP, RN

## 2019-03-06 NOTE — Plan of Care (Signed)
  Problem: Health Behavior/Discharge Planning: Goal: Ability to manage health-related needs will improve Outcome: Progressing   Problem: Clinical Measurements: Goal: Ability to maintain clinical measurements within normal limits will improve Outcome: Progressing Goal: Will remain free from infection Outcome: Progressing   

## 2019-03-06 NOTE — Progress Notes (Signed)
[place]  [phone] Fax:([phone]) [phone]     ID: [name] DOB: 10/09/1928  MR#: [id number]  CNO#:[id number]  Patient Care Team: Brett Bush, Reeves as PCP - General (Family Medicine) Brett Pearson, Reeves as Consulting Physician (Ophthalmology) Brett Gallo, Reeves as Consulting Physician (Urology) [name], DDS as Consulting Physician (Dentistry) Brett Cruel, Reeves OTHER Reeves:  CHIEF COMPLAINT: adenopathy and splenic lesion  CURRENT TREATMENT: Diffuse large cell B-cell non-Hodgkin's lymphoma   INTERVAL HISTORY: I met today with the patient's family, daughter [name], son [name], and by speaker phone wife [name] to discuss the patient's situation   REVIEW OF SYSTEMS: The patient is currently being fed through a nasogastric tube.  He is having great difficulty ambulating.  Of course he continues in the hospital.  His ECOG status at present is 3.   HISTORY OF CURRENT ILLNESS: From the original consult note:  This 86 year old [name] followed by Brett. Ria Reeves was brought to the emergency room by his son on 02/22/2019 with failure to thrive and progressive confusion.  He was found to have a temperature of 99.4 and a room air saturation of 84%.  Subsequently he was found to be COVID-19 positive.  Further work-up in the emergency room included a chest x-ray which showed no active disease, however  CT of the abdomen and pelvis was obtained also showing patchy areas of airspace disease and a low-attenuation soft tissue mass in the central spleen measuring 3.8 cm. In addition there was retroperitoneal adenopathy measuring up to 4.9 cm in the left para-aortic region.  There was no pelvic adenopathy.  On 0102/2021 CT scan of the chest showed patchy bilateral groundglass opacities and small bilateral pleural effusions.  There was also right hilar and left supraclavicular adenopathy on the chest CT scan.  Noncontrast head CT  02/22/2019 showed only atrophy.  For further details of the patient's diagnosis and treatment see below   PAST MEDICAL HISTORY: Past Medical History:  Diagnosis Date  . Allergic rhinitis   . BPH (benign prostatic hypertrophy) with urinary obstruction 2000   40gm, L lobe 16m prostate nodule; followed yearly by [name] . GERD (gastroesophageal reflux disease)   . Glaucoma   . Herpes zoster ophthalmicus    history  . History of Reeves stones remote   x 1-Ca monhydrate, hyperoxalate urine s/p lithotripsy  . HLD (hyperlipidemia)   . HTN (hypertension)   . Impaired glucose tolerance   . Impaired hearing    uses hearing aides bilaterally  . Osteopenia     PAST SURGICAL HISTORY: Past Surgical History:  Procedure Laterality Date  . APPENDECTOMY  1997  . CATARACT EXTRACTION Right 11/1013   Brett. WVenetia Reeves . COLONOSCOPY  2002   WNL-[name]; repeat 2012  . COLONOSCOPY  02/2012   tubular adenoma ([name]  . Reeves  left-1955; right-1970  . LITHOTRIPSY  2005  . SBO  2008, 2010    FAMILY HISTORY Family History  Problem Relation Age of Onset  . Coronary artery disease Father   . Healthy Mother   . Diabetes Brother   . Breast cancer Sister   . Leukemia Maternal Uncle   . 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  She was primary caregiver to her mother who was bedbound for more than 30 years.  The patient has 2 children, his son fell who works in Risk manager and his daughter [name] who works at the urology center here at [employer] long as a [name].  Both children live within a couple of miles of the patient's home  ADVANCED DIRECTIVES: Not in place.  In the absence of any documents to the contrary the patient's wife is his healthcare power of  attorney.  The patient's son tells me he feels his mother would be very competent in that role   HEALTH MAINTENANCE: Social History   Tobacco Use  . Smoking status: Former Smoker    Types: Cigarettes  . Smokeless tobacco: Never Used  Substance Use Topics  . Alcohol use: No  . Drug use: No     No Known Allergies  No current facility-administered medications for this visit.   No current outpatient medications on file.   Facility-Administered Medications Ordered in Other Visits  Medication Dose Route Frequency Provider Last Rate Last Admin  . ascorbic acid (VITAMIN C) tablet 500 mg  500 mg Oral Daily Rama, [name]   500 mg at 03/07/19 1126  . aspirin EC tablet 81 mg  81 mg Oral Daily [name]   81 mg at 03/07/19 1128  . atorvastatin (LIPITOR) tablet 5 mg  5 mg Oral Daily [name]   5 mg at 03/07/19 1127  . brinzolamide (AZOPT) 1 % ophthalmic suspension 1 drop  1 drop Both Eyes TID [name]   1 drop at 03/07/19 1614  . Chlorhexidine Gluconate Cloth 2 % PADS 6 each  6 each Topical Daily Brett [name]   6 each at 03/06/19 1230  . Chlorhexidine Gluconate Cloth 2 % PADS 6 each  6 each Topical Daily [name], [name]   6 each at 03/07/19 [phone]  . donepezil (ARICEPT) tablet 10 mg  10 mg Oral QHS [name]   10 mg at 03/06/19 2123  . enoxaparin (LOVENOX) injection 40 mg  40 mg Subcutaneous Daily [name], [name]   40 mg at 03/07/19 1121  . feeding supplement (ENSURE ENLIVE) (ENSURE ENLIVE) liquid 237 mL  237 mL Oral TID BM [name]   237 mL at 03/07/19 1357  . feeding supplement (OSMOLITE 1.5 CAL) liquid 1,000 mL  1,000 mL Per Tube Q24H [name] 60 mL/hr at 03/07/19 1357 1,000 mL at 03/07/19 1357  . lactulose (CHRONULAC) 10 GM/15ML solution 30 g  30 g Oral BID PRN [name]       Or  . lactulose (CHRONULAC) enema 200 gm  300 mL Rectal BID PRN [name]      . latanoprost (XALATAN) 0.005 %  ophthalmic solution 1 drop  1 drop Both Eyes QHS [name]   1 drop at 03/06/19 2124  . multivitamin with minerals tablet 1 tablet  1 tablet Oral Daily [name]   1 tablet at 03/07/19 1127  . penicillin G potassium 12 Million Units in dextrose 5 % 500 mL continuous infusion  12 Million Units Intravenous Q12H Thayer Headings, Reeves 41.7 mL/hr at 03/07/19 0814 12 Million Units at 03/07/19 0814  . polyethylene glycol (MIRALAX / GLYCOLAX) packet 17 g  17 g Oral Daily [name]   17 g at 03/07/19 1129  . QUEtiapine (SEROQUEL) tablet  25 mg  25 mg Oral QHS [name]   25 mg at 03/06/19 2123  . senna-docusate (Senokot-S) tablet 1 tablet  1 tablet Oral BID [name]   1 tablet at 03/07/19 1127  . sodium chloride flush (NS) 0.9 % injection 10-40 mL  10-40 mL Intracatheter PRN Ladye Macnaughton, [name]   10 mL at 03/06/19 0520  . zinc sulfate capsule 220 mg  220 mg Oral Daily Rama, [name]   220 mg at 03/07/19 1127    OBJECTIVE: The patient did not participate in today's family meeting.  There were no vitals filed for this visit.   There is no height or weight on file to calculate BMI.   Wt Readings from Last 3 Encounters:  03/06/19 149 lb (67.6 kg)  02/05/19 164 lb 3 oz (74.5 kg)  04/20/18 163 lb 3 oz (74 kg)      LAB RESULTS:  CMP     Component Value Date/Time   NA 133 (L) 03/07/2019 0502   K 3.9 03/07/2019 0502   CL 98 03/07/2019 0502   CO2 27 03/07/2019 0502   GLUCOSE 136 (H) 03/07/2019 0502   BUN 23 03/07/2019 0502   CREATININE 0.60 (L) 03/07/2019 0502   CALCIUM 8.3 (L) 03/07/2019 0502   PROT 5.9 (L) 03/04/2019 0418   ALBUMIN 2.6 (L) 03/04/2019 0418   AST 39 03/04/2019 0418   ALT 39 03/04/2019 0418   ALKPHOS 70 03/04/2019 0418   BILITOT 0.9 03/04/2019 0418   GFRNONAA >60 03/07/2019 0502   GFRAA >60 03/07/2019 0502    No results found for: TOTALPROTELP, ALBUMINELP, A1GS, A2GS, BETS, BETA2SER, GAMS, MSPIKE, SPEI  No results  found for: KPAFRELGTCHN, LAMBDASER, KAPLAMBRATIO  Lab Results  Component Value Date   WBC 12.2 (H) 03/03/2019   NEUTROABS 11.1 (H) 02/26/2019   HGB 13.4 03/03/2019   HCT 41.3 03/03/2019   MCV 94.9 03/03/2019   PLT  03/03/2019    PLATELET CLUMPS NOTED ON SMEAR, COUNT APPEARS ADEQUATE   No results found for: LABCA2  No components found for: [id number]  Recent Labs  Lab 03/01/19 0257  INR 1.2    No results found for: LABCA2  No results found for: PIR518  No results found for: ACZ660  No results found for: YTK160  No results found for: CA2729  No components found for: HGQUANT  Lab Results  Component Value Date   CEA1 3.9 02/27/2019   /  CEA  Date Value Ref Range Status  02/27/2019 3.9 0.0 - 4.7 ng/mL Final    Comment:    (NOTE)                             Nonsmokers          <3.9                             Smokers             <5.6 Roche [employer]ostics Electrochemiluminescence Immunoassay (ECLIA) Values obtained with different assay methods or kits cannot be used interchangeably.  Results cannot be interpreted as absolute evidence of the presence or absence of malignant disease. Performed At: [facility] [place], [place] [id number] [address] Reeves DU:[id number]      No results found for: AFPTUMOR  No results found for: [place]  No results found for: PSA1  No results displayed because visit has over 200 results.      (this displays the last labs from the last 3 days)  No results found for: TOTALPROTELP, ALBUMINELP, A1GS, A2GS, BETS, BETA2SER, GAMS, MSPIKE, SPEI (this displays SPEP labs)  No results found for: KPAFRELGTCHN, LAMBDASER, KAPLAMBRATIO (kappa/lambda light chains)  No results found for: HGBA, HGBA2QUANT, HGBFQUANT, HGBSQUAN (Hemoglobinopathy evaluation)   Lab Results  Component Value Date   LDH 215 (H) 02/23/2019    No results found for: IRON, TIBC, IRONPCTSAT (Iron and TIBC)  Lab Results  Component  Value Date   FERRITIN 772 (H) 02/23/2019    Urinalysis    Component Value Date/Time   COLORURINE YELLOW 02/22/2019 1226   APPEARANCEUR CLEAR 02/22/2019 1226   LABSPEC 1.019 02/22/2019 1226   PHURINE 7.0 02/22/2019 1226   GLUCOSEU NEGATIVE 02/22/2019 [place] 02/22/2019 1226   BILIRUBINUR NEGATIVE 02/22/2019 1226   BILIRUBINUR negative 02/05/2019 1610   KETONESUR NEGATIVE 02/22/2019 1226   PROTEINUR NEGATIVE 02/22/2019 1226   UROBILINOGEN 0.2 02/05/2019 1610   UROBILINOGEN 0.2 03/21/2007 0944   NITRITE NEGATIVE 02/22/2019 [place] 02/22/2019 1226     STUDIES: DG Abd 1 View  Result Date: 03/04/2019 CLINICAL DATA:  NG tube placement EXAM: ABDOMEN - 1 VIEW COMPARISON:  08/06/2005 FINDINGS: Feeding tube coils in the stomach with the tip in the fundus. Nonobstructive bowel gas pattern. Visualized lungs clear. IMPRESSION: Feeding tube coils in the stomach with the tip in the fundus. Electronically Signed   By: Rolm Baptise M.D.   On: 03/04/2019 19:27   CT Head Wo Contrast  Result Date: 02/22/2019 CLINICAL DATA:  Altered mental status with fatigue.  Dehydration. EXAM: CT HEAD WITHOUT CONTRAST TECHNIQUE: Contiguous axial images were obtained from the base of the skull through the vertex without intravenous contrast. COMPARISON:  None. FINDINGS: Brain: There is moderate diffuse atrophy. There is no intracranial mass, hemorrhage, extra-axial fluid collection, or midline shift. Brain parenchyma appears unremarkable. No evident acute infarct. Vascular: There is no hyperdense vessel. There are foci of calcification in each carotid siphon region. Skull: Bony calvarium appears intact. Sinuses/Orbits: There is a retention cyst in the inferior posterior right maxillary antrum. There is mucosal thickening and opacification involving multiple ethmoid air cells. Orbits appear symmetric bilaterally except for evidence of previous cataract surgery on the right. Other:  Mastoid air cells are clear. IMPRESSION: 1. Atrophy. Brain parenchyma appears unremarkable. No acute infarct. No mass or hemorrhage. 2.  Foci of arterial vascular calcification noted. 3.  Foci of paranasal sinus disease evident. Electronically Signed   By: Lowella Grip III M.D.   On: 02/22/2019 14:57   CT CHEST W CONTRAST  Result Date: 02/24/2019 CLINICAL DATA:  Provided history of colon cancer of unknown primary, surveillance Technologist notes state pneumonia due to COVID-19. EXAM: CT CHEST WITH CONTRAST TECHNIQUE: Multidetector CT imaging of the chest was performed during intravenous contrast administration. CONTRAST:  86m OMNIPAQUE IOHEXOL 300 MG/ML  SOLN COMPARISON:  Chest radiograph 02/22/2019. Abdominal CT 02/12/2019. No remote prior chest CT. FINDINGS: Cardiovascular: Aortic atherosclerosis. No aneurysm or dissection. Coronary artery calcifications. Normal heart size. No pericardial effusion. Mediastinum/Nodes: Probable enlarged right hilar node measuring 18 mm short axis, series 2, image 66. no density appears similar to the adjacent pulmonary vasculature, but appears separate. Left supraclavicular adenopathy, largest node measuring 2 cm, series 2, image 19. No axillary adenopathy. No thyroid nodule. Esophagus slightly patulous but no wall thickening. Lungs/Pleura: Patchy bilateral ground-glass opacities  within both lungs, right greater than left. Small bilateral pleural effusions. Background lung opacity limits assessment for pulmonary nodule or mass, no dominant pulmonary mass visualized. Calcified granuloma in the right lower lobe. Mild elevation of right hemidiaphragm. Upper Abdomen: Assessed on recent abdominal CT. Retroperitoneal adenopathy. Hepatic and renal cysts and central splenic mass. Musculoskeletal: Multilevel degenerative change in the spine. There are no acute or suspicious osseous abnormalities. No evidence of lytic or blastic osseous lesion. IMPRESSION: 1. Right hilar and left  supraclavicular adenopathy. Given abdominal adenopathy, findings suspicious for lymphoproliferative disorder. 2. Patchy bilateral ground-glass opacities within both lungs, right greater than left consistent with COVID pneumonia. 3. Small bilateral pleural effusions. 4. Aortic Atherosclerosis (ICD10-I70.0). Coronary artery calcifications. Electronically Signed   By: Keith Rake M.D.   On: 02/24/2019 23:25   CT ABDOMEN PELVIS W CONTRAST  Result Date: 02/22/2019 CLINICAL DATA:  Abdominal distension. Anorexia. EXAM: CT ABDOMEN AND PELVIS WITH CONTRAST TECHNIQUE: Multidetector CT imaging of the abdomen and pelvis was performed using the standard protocol following bolus administration of intravenous contrast. CONTRAST:  168m OMNIPAQUE IOHEXOL 300 MG/ML  SOLN COMPARISON:  03/21/2007 FINDINGS: Lower Chest: Patchy areas of airspace disease are seen bilaterally mainly in the lower lobes, suspicious for viral pneumonia or inflammatory process. No evidence of pleural effusion. Hepatobiliary: No hepatic masses identified. Multiple small hepatic cysts are stable. Gallbladder is unremarkable. No evidence of biliary ductal dilatation. Pancreas:  No mass or inflammatory changes. Spleen: No evidence of splenomegaly. A new low-attenuation soft tissue mass is seen in the central spleen which measures 3.8 x 3.4 cm. Adrenals/Urinary Tract: Several large renal cysts are again seen bilaterally. No masses identified. No evidence of ureteral calculi or hydronephrosis. Unremarkable unopacified urinary bladder. Stomach/Bowel: No evidence of obstruction, inflammatory process or abnormal fluid collections. Vascular/Lymphatic: New abdominal retroperitoneal lymphadenopathy is seen in the region of the celiac axis and throughout the left paraaortic and aortocaval spaces. Largest conglomeration of lymph nodes in the left paraaortic region measures 4.9 x 3.1 cm on image 43/2. No pathologically enlarged lymph nodes seen within the pelvis.  Aortic atherosclerosis incidentally noted. No abdominal aortic aneurysm. Reproductive:  No mass or other significant abnormality identified. Other:  None. Musculoskeletal:  No suspicious bone lesions identified. IMPRESSION: 1. New abdominal retroperitoneal lymphadenopathy and splenic mass. This is likely due to lymphoproliferative disorder, with metastatic disease considered less likely. 2. Patchy bilateral airspace disease mainly in the lower lobes, suspicious for viral pneumonia or inflammatory process. Pulmonary involvement by lymphoma is considered less likely but cannot be excluded. Recommend clinical correlation and COVID-19 virus testing. Electronically Signed   By: JMarlaine HindM.D.   On: 02/22/2019 15:05   DG Chest Portable 1 View  Result Date: 02/22/2019 CLINICAL DATA:  Increasing shortness of breath over the past 7 days EXAM: PORTABLE CHEST 1 VIEW COMPARISON:  10/17/2003 FINDINGS: Cardiac shadow is accentuated by the portable technique. Aortic calcifications are noted. Elevation the right hemidiaphragm is noted new from the prior exam. No focal infiltrate or sizable effusion is seen. No bony abnormality is noted. IMPRESSION: No active disease. Electronically Signed   By: MInez CatalinaM.D.   On: 02/22/2019 14:03   [place] BIOPSY (LYMPH NODES)  Result Date: 02/28/2019 INDICATION: 86year old male with a history supraclavicular adenopathy, possible lymphoma EXAM: ULTRASOUND-GUIDED BIOPSY OF LEFT SUPRACLAVICULAR/CERVICAL NODES MEDICATIONS: None. ANESTHESIA/SEDATION: None FLUOROSCOPY TIME:  None COMPLICATIONS: None PROCEDURE: Informed written consent was obtained from the patient after a thorough discussion of the procedural risks, benefits and alternatives.  All questions were addressed. Maximal Sterile Barrier Technique was utilized including caps, mask, sterile gowns, sterile gloves, sterile drape, hand hygiene and skin antiseptic. A timeout was performed prior to the initiation of the procedure.  Patient positioned supine position. Ultrasound survey was performed with images stored sent to PACs. The patient is prepped and draped in the usual sterile fashion. 1% lidocaine was used for local anesthesia. Using ultrasound guidance, multiple 18 gauge core biopsy were achieved of left supraclavicular/cervical lymph nodes. Tissues placed into fresh specimen/saline container. Final image was stored. Patient tolerated the procedure well and remained hemodynamically stable throughout. No complications were encountered and no significant blood loss. IMPRESSION: Status post ultrasound-guided biopsy of left supraclavicular lymph nodes. Signed, Dulcy Fanny. Dellia Nims, RPVI [facility] Radiology Electronically Signed   By: Corrie Mckusick D.O.   On: 02/28/2019 17:15   [place] EKG SITE RITE  Result Date: 03/02/2019 If Site Rite image not attached, placement could not be confirmed due to current cardiac rhythm.  DG FL GUIDED LUMBAR PUNCTURE  Result Date: 03/01/2019 CLINICAL DATA:  Encephalopathy.  Positive syphilis screening test. EXAM: DIAGNOSTIC LUMBAR PUNCTURE UNDER FLUOROSCOPIC GUIDANCE FLUOROSCOPY TIME:  Fluoroscopy Time:  1 minutes 18 seconds Radiation exposure: 14.0 mGy Number of Acquired Spot Images: 0 PROCEDURE: Due to the patient's altered mental status, informed consent was obtained by telephone from the patient's son Brodric Schauer. With the patient prone, the lower back was prepped with Betadine. 1% Lidocaine was used for local anesthesia. Lumbar puncture was performed at the L4-5 level using a 20 gauge needle with return of clear CSF with an opening pressure of 9 cm water. 10 ml of CSF were obtained for laboratory studies. The patient tolerated the procedure well and there were no apparent complications. IMPRESSION: Successful diagnostic lumbar puncture performed at L4-5 under fluoroscopic guidance. No immediate complication. Electronically Signed   By: Marlaine Hind  M.D.   On: 03/01/2019 16:03    ASSESSMENT: 86 y.o. Shea Stakes, [place] man with diffuse large-cell B-cell non-Hodgkin's lymphoma diagnosed by left supraclavicular lymph node biopsy 02/28/2019 (a) stage III (b) IPI score 4 (age, stage, ECOG, LDH): predicts PFS3 of 55% if candidate for aggressive therapy  (c) DLCBC subtype: non-germ cell (activated B-cell subtype) (d) MYC, BCL2 and BCL6 studies pending  (1) infectious concerns: (a) COVID-19 positivity diagnosed 02/22/2019 (b) possible tertiary siphilys: intravenous penicillin started 03/01/2018--2 in 03/13/2018  (2) weekly rituximab to start 03/22/2018  PLAN: I met with [name] and [name] today and with [facility] by speaker phone and explained the different types of cancer including carcinomas, sarcomas, and cancers of white cells; the different types of white cell cancers; the difference between lymphomas and leukemias; and the normal functioning of the lymph system.  They understand there are more than 40 types of lymphomas.  We tend to divide them in slow growing and fast growing.  The slow-growing ones cannot be cured but are easy to treat.  The fast-growing ones are curable but require more aggressive treatment.  What Mr. Sherburn has is a diffuse large cell B-cell non-Hodgkin's lymphoma.  It is the nongerm cell type (slightly worse prognosis, but may predict for possible response to ibrutinib).  It is stage III.  We then moved onto a consideration of treatment options.    Standard of care for [facility] NHL is CHOP-Rituximab. In patients who can tolerate this aggressive treatment and have an IPI of 4 as Mr Martel does the predicted disease free survival at 3 years  is about 55%. If the patient cannot tolerate CHOP, bendamustine + rituximab or similar can be given in a palliative setting. Other palliative options with no chemotherapy include rituximab alone, rituximab + ibrutinib, ibrutinib  alone, and lenalidomide.-- Note that the use of ibrutinib and lenolidomide in this setting is still being studied but appears to be of some use in patients with the non-germinal center cell subtype (as here).   All of these treatments including the least aggressive ones have possible side effects and we went over those at least to some extent today.  Note that [place] and other pending gene studies if positive may indicate an especially aggressive "double hit" [place] NHL.  We should have that information later this week  Obviously given the patient's age, dementia and comorbidities family input will be important in treatment decisions.  After much discussion the first thing we all agreed on is that Mr. Strohecker is not currently in a situation to receive any treatment.  He needs to have an ECOG functional status of at least 2 (meaning he is out of bed more than 50% of the time during the day) before we can start even the simplest therapy  Assuming he goes home and does improve at least that much the plan would be to start weekly rituximab on January 29.  We would repeat that for the next 8 weeks and then reassess.  If he has had a very good response we can consider simply continuing maintenance or perhaps adding ibrutinib.  If in addition he improves clinically the family feels Mr. Troop would want aggressive treatment and that would involve chemotherapy with 1 of the options already discussed above.  On the other hand if Mr. Duvall really does not improve but rather declines over the next few weeks then comfort care under hospice would be best.  The family is very appreciative of the support and care their father and husband is receiving at the hospital.  We are planning to keep the PICC line in place even after the penicillin is done and that can be used for chemotherapy access.  It will also be important if we can do it to have the patient's wife stay in the back with the patient while receiving chemo  giving the possibility of confusion.  I have entered the appropriate orders.   Brett Cruel, Reeves   03/07/2019 5:49 PM Medical Oncology and Hematology [facility] [place], [facility] [address] Tel. [phone]    Fax. [phone]   I, Wilburn Mylar, am acting as scribe for Brett. Virgie Dad. Azaryah Heathcock.  I, Lurline Del Reeves, have reviewed the above documentation for accuracy and completeness, and I agree with the above.    *Total Encounter Time as defined by the Centers for Medicare and Medicaid Services includes, in addition to the face-to-face time of a patient visit (documented in the note above) non-face-to-face time: obtaining and reviewing outside history, ordering and reviewing medications, tests or procedures, care coordination (communications with other health care professionals or caregivers) and documentation in the medical record.

## 2019-03-06 NOTE — Progress Notes (Signed)
Called pt Son [name] and updated on Nursing care; son had an opportunity to speak with patient. Son([name]) appreciative having the time to talk briefly with his Dad. Pt calm and receptive. Son asked if I will call his Mom(pt spouse, [name]) later. Will attempt later. SRP, RN

## 2019-03-07 ENCOUNTER — Inpatient Hospital Stay: Payer: Medicare Other | Attending: Oncology | Admitting: Oncology

## 2019-03-07 DIAGNOSIS — C8330 Diffuse large B-cell lymphoma, unspecified site: Secondary | ICD-10-CM

## 2019-03-07 DIAGNOSIS — R161 Splenomegaly, not elsewhere classified: Secondary | ICD-10-CM

## 2019-03-07 DIAGNOSIS — G3184 Mild cognitive impairment, so stated: Secondary | ICD-10-CM

## 2019-03-07 DIAGNOSIS — R590 Localized enlarged lymph nodes: Secondary | ICD-10-CM

## 2019-03-07 LAB — BASIC METABOLIC PANEL
Anion gap: 8 (ref 5–15)
BUN: 23 mg/dL (ref 8–23)
CO2: 27 mmol/L (ref 22–32)
Calcium: 8.3 mg/dL — ABNORMAL LOW (ref 8.9–10.3)
Chloride: 98 mmol/L (ref 98–111)
Creatinine, Ser: 0.6 mg/dL — ABNORMAL LOW (ref 0.61–1.24)
GFR calc Af Amer: >60 mL/min (ref >60)
GFR calc non Af Amer: >60 mL/min (ref >60)
Glucose, Bld: 136 mg/dL — ABNORMAL HIGH (ref 70–99)
Potassium: 3.9 mmol/L (ref 3.5–5.1)
Sodium: 133 mmol/L — ABNORMAL LOW (ref 135–145)

## 2019-03-07 LAB — PHOSPHORUS: Phosphorus: 3.1 mg/dL (ref 2.5–4.6)

## 2019-03-07 LAB — GLUCOSE, CAPILLARY
Glucose-Capillary: 102 mg/dL — ABNORMAL HIGH (ref 70–99)
Glucose-Capillary: 110 mg/dL — ABNORMAL HIGH (ref 70–99)
Glucose-Capillary: 113 mg/dL — ABNORMAL HIGH (ref 70–99)
Glucose-Capillary: 124 mg/dL — ABNORMAL HIGH (ref 70–99)
Glucose-Capillary: 152 mg/dL — ABNORMAL HIGH (ref 70–99)
Glucose-Capillary: 97 mg/dL (ref 70–99)

## 2019-03-07 LAB — MAGNESIUM: Magnesium: 2.5 mg/dL — ABNORMAL HIGH (ref 1.7–2.4)

## 2019-03-07 LAB — D-DIMER, QUANTITATIVE: D-Dimer, Quant: 3.15 ug/mL-FEU — ABNORMAL HIGH (ref 0.00–0.50)

## 2019-03-07 MED ORDER — OSMOLITE 1.5 CAL PO LIQD
1000.0000 mL | ORAL | Status: DC
  Administered 2019-03-07: 14:00:00 1000 mL
  Filled 2019-03-07 (×4): qty 1000

## 2019-03-07 NOTE — Plan of Care (Signed)
  Problem: Health Behavior/Discharge Planning: Goal: Ability to manage health-related needs will improve Outcome: Not Progressing   Problem: Clinical Measurements: Goal: Ability to maintain clinical measurements within normal limits will improve Outcome: Not Progressing Goal: Will remain free from infection Outcome: Progressing

## 2019-03-07 NOTE — Progress Notes (Signed)
OT Cancellation Note  Patient Details Name: KAYSIN LEMMING MRN: [id number] DOB: [birth year]   Cancelled Treatment:    Reason Eval/Treat Not Completed: Fatigue/lethargy limiting ability to participate   RN present.  Pt very lethargic. Will check on pt next day  Kari Baars, [place] [phone] Office- [phone], Thereasa Parkin 03/07/2019, 2:13 PM

## 2019-03-07 NOTE — Progress Notes (Signed)
PROGRESS NOTE  [name] [id number] DOB: May 03, 1928   PCP: [name], MD  Patient is from: Home  DOA: 02/22/2019 LOS: 7  Brief Narrative / Interim history:  86 y.o. male with a PMH of advanced dementia, hypertension, hyperlipidemia, GERD and BPH who was admitted 02/22/2019 with a chief complaint of generalized weakness progressive over several weeks.  In the ED, he had low-grade temperature but was noted to be hypoxic with a room air saturation of 84% associated with tachycardia.  Chest x-ray was clear and CT was negative for acute infarct.  CT of the abdomen and pelvis showed a splenic mass and retroperitoneal adenopathy along with patchy bilateral airspace disease consistent with viral pneumonia.  COVID-19 testing was positive as was RPR and HIV testing.  T. Pallidum positive. However, RPR titer 1:1. Unclear if this was treated or untreated.  Confirmatory HIV test negative.  Infectious disease, IR and oncology consulted.  He underwent lumbar puncture on 02/28/2018.  Cytology not indicated for specific etiology.  CSF protein elevated to 100.  Gram stain and cultures negative so far.  VDRL pending.  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  Patient had biopsy of supraclavicular lymph nodes on 02/27/2018.  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  Subjective: Reportedly received Haldol last night but no report of agitation documented.  He is a sleepy/somnolent this morning.  No respiratory issue or apparent distress.  Objective: Vitals:   03/06/19 1221 03/06/19 2048 03/07/19 0424 03/07/19 1127  BP: 125/78 131/62 106/61 114/67  Pulse: 81 (!) 107 88 87  Resp: 16 18 18 18   Temp: 97.8 F (36.6 C) 98.7 F (37.1 C) 97.7 F (36.5 C) 98.1 F (36.7 C)  TempSrc: Oral Oral Oral Oral  SpO2: 98% 99% 99% 99%  Weight:      Height:        Intake/Output Summary (Last 24 hours) at  03/07/2019 1351 Last data filed at 03/07/2019 0600 Gross per 24 hour  Intake 3199.5 ml  Output 1950 ml  Net 1249.5 ml   Filed Weights   03/04/19 1138 03/05/19 0500 03/06/19 0415  Weight: 67.1 kg 68.5 kg 67.6 kg    Examination:  GENERAL: Frail and chronically ill-appearing.  No distress. HEENT: MMM.  Vision and hearing grossly intact.  NECK: Supple.  No apparent JVD.  RESP:  No IWOB. Good air movement bilaterally. CVS:  RRR. Heart sounds normal.  ABD/GI/GU: Bowel sounds present. Soft. Non tender.  MSK/EXT:  No apparent deformity. No edema.  SKIN: no apparent skin lesion or wound NEURO: Sleepy/somnolent.  Moans to noxious stimuli.  No apparent focal neuro deficit but difficult exam. PSYCH: Sleepy and somnolent.  No agitation.  Procedures:  None  Assessment & Plan: Pneumonia due to COVID-19 infection: CT chest with bibasilar groundglass opacities.  CRP normalized.  D-dimer remains elevated.  No respiratory distress.  Remained stable on room air. Recent Labs    03/05/19 0430 03/06/19 0519 03/07/19 0502  DDIMER 3.75* 3.41* 3.15*  -Remdesivir 1/1-1/6 -Decadron 1/1>> 1/10 -Subcu Lovenox for VTE prophylaxis. 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  Ammonia 36>14.  Thiamine and vitamin B12 normal.  COVID-19 and acute hospitalization could contribute.  Sleepier today likely due to Haldol. -Continue low-dose Seroquel at bedtime  -Discontinue Haldol. -Delirium precautions -Bowel regimen with lactulose as needed -Treating-tertiary syphilis as below.  Possible neurosyphilis: RPR and  T. Pallidum positive although low titer.  CSF protein elevated to 100.  Cytology not indicated for specific etiology.  CSF Gram stain and cultures negative so far.  CSF VDRL negative but this does not exclude per ID. -ID started continuous penicillin G infusion on 1/8 for a total of 14 days.  PICC line placed on  1/9.  Large-cell B-cell non-Hodgkin's lymphoma: Confirmed by supraclavicular lymph node biopsy on 1/6. Splenic mass/retroperitoneal right healer and left supraclavicular adenopathy likely due to lymphoma -Oncology planning to discuss treatment options with family need to 1/13 or 1/14.  Hypotension: Normotensive. -Continue holding Avapro.  Glaucoma -Continue home eyedrops.  BPH: Not on medication at home. -Monitor urinary output.  Physical deconditioning/debility: -Supportive care.  Goal of care: Patient remains full code per family wish although this would pose more harm than benefit in light of patient's condition.  Palliative care following. -Appreciate palliative input-full code and full scope of care -May need PEG tube if family wish to continue full scope after discussion with oncology    Moderate protein calorie malnutrition -Continue to feed via NG tube Nutrition Problem: Increased nutrient needs Etiology: acute illness(COVID-19)  Signs/Symptoms: estimated needs  Interventions: Tube feeding, Magic cup, Ensure Enlive (each supplement provides 350kcal and 20 grams of protein)   DVT prophylaxis: Subcu Lovenox Code Status: Full code Family Communication: Discussed with patient's son over the phone on 1/10 Disposition Plan: Remains inpatient pending discussion about plan of treatment for his lymphoma which would determine his goal of care such as a decision to place PEG tube. Consultants: IR (off), ID (off), oncology, palliative care    Microbiology summarized: [id number] positive. Urine culture with multiple species.  Sch Meds:  Scheduled Meds: . vitamin C  500 mg Oral Daily  . aspirin EC  81 mg Oral Daily  . atorvastatin  5 mg Oral Daily  . brinzolamide  1 drop Both Eyes TID  . Chlorhexidine Gluconate Cloth  6 each Topical Daily  . Chlorhexidine Gluconate Cloth  6 each Topical Daily  . donepezil  10 mg Oral QHS  . enoxaparin (LOVENOX) injection  40 mg  Subcutaneous Daily  . feeding supplement (ENSURE ENLIVE)  237 mL Oral TID BM  . feeding supplement (OSMOLITE 1.5 CAL)  1,000 mL Per Tube Q24H  . latanoprost  1 drop Both Eyes QHS  . multivitamin with minerals  1 tablet Oral Daily  . polyethylene glycol  17 g Oral Daily  . QUEtiapine  25 mg Oral QHS  . senna-docusate  1 tablet Oral BID  . zinc sulfate  220 mg Oral Daily   Continuous Infusions: . penicillin g continuous IV infusion 12 Million Units (03/07/19 0814)   PRN Meds:.haloperidol lactate, lactulose **OR** lactulose, sodium chloride flush  Antimicrobials: Anti-infectives (From admission, onward)   Start     Dose/Rate Route Frequency Ordered Stop   03/02/19 1800  penicillin G potassium 12 Million Units in dextrose 5 % 500 mL continuous infusion     12 Million Units 41.7 mL/hr over 12 Hours Intravenous Every 12 hours 03/02/19 1514 03/16/19 1759   02/24/19 1000  remdesivir 100 mg in sodium chloride 0.9 % 100 mL IVPB     100 mg 200 mL/hr over 30 Minutes Intravenous Daily 02/23/19 1519 02/27/19 1120   02/23/19 1600  cefTRIAXone (ROCEPHIN) 1 g in sodium chloride 0.9 % 100 mL IVPB  Status:  Discontinued     1 g 200 mL/hr over 30 Minutes Intravenous Every 24 hours 02/22/19 1751 02/23/19 0818  02/23/19 1600  remdesivir 200 mg in sodium chloride 0.9% 250 mL IVPB     200 mg 580 mL/hr over 30 Minutes Intravenous Once 02/23/19 1519 02/23/19 1624   02/23/19 1500  azithromycin (ZITHROMAX) 500 mg in sodium chloride 0.9 % 250 mL IVPB  Status:  Discontinued     500 mg 250 mL/hr over 60 Minutes Intravenous Every 24 hours 02/22/19 1751 02/23/19 0818   02/22/19 1630  cefTRIAXone (ROCEPHIN) 1 g in sodium chloride 0.9 % 100 mL IVPB     1 g 200 mL/hr over 30 Minutes Intravenous  Once 02/22/19 1615 02/22/19 1729   02/22/19 1630  azithromycin (ZITHROMAX) 500 mg in sodium chloride 0.9 % 250 mL IVPB     500 mg 250 mL/hr over 60 Minutes Intravenous  Once 02/22/19 1615 02/22/19 1902       I have  personally reviewed the following labs and images: CBC: Recent Labs  Lab 03/01/19 0257 03/02/19 0255 03/03/19 0256  WBC 11.5* 11.1* 12.2*  HGB 13.3 13.3 13.4  HCT 40.4 41.2 41.3  MCV 93.1 94.7 94.9  PLT 153 203 PLATELET CLUMPS NOTED ON SMEAR, COUNT APPEARS ADEQUATE   BMP &GFR Recent Labs  Lab 03/03/19 0256 03/04/19 0418 03/05/19 0430 03/06/19 0519 03/07/19 0502  NA 136 137 135 133* 133*  K 4.0 4.4 4.3 4.1 3.9  CL 105 104 101 99 98  CO2 22 26 26 28 27   GLUCOSE 117* 139* 159* 102* 136*  BUN 34* 24* 33* 30* 23  CREATININE 0.65 0.60* 0.68 0.59* 0.60*  CALCIUM 8.6* 8.5* 8.6* 8.5* 8.3*  MG 2.5* 2.7* 2.5* 2.3 2.5*  PHOS  --   --  2.4* 2.7 3.1   Estimated Creatinine Clearance: 57.6 mL/min (A) (by C-G formula based on SCr of 0.6 mg/dL (L)). Liver & Pancreas: Recent Labs  Lab 03/01/19 0257 03/02/19 0255 03/03/19 0256 03/04/19 0418  AST 54* 45* 42* 39  ALT 43 39 38 39  ALKPHOS 64 66 69 70  BILITOT 1.0 1.1 1.0 0.9  PROT 5.7* 5.8* 6.0* 5.9*  ALBUMIN 2.5* 2.6* 2.7* 2.6*   No results for input(s): LIPASE, AMYLASE in the last 168 hours. Recent Labs  Lab 03/01/19 0257 03/05/19 0400  AMMONIA 36* 14   Diabetic: No results for input(s): HGBA1C in the last 72 hours. Recent Labs  Lab 03/06/19 2044 03/07/19 0017 03/07/19 0420 03/07/19 0819 03/07/19 1125  GLUCAP 118* 152* 113* 102* 97   Cardiac Enzymes: No results for input(s): CKTOTAL, CKMB, CKMBINDEX, TROPONINI in the last 168 hours. No results for input(s): PROBNP in the last 8760 hours. Coagulation Profile: Recent Labs  Lab 03/01/19 0257  INR 1.2   Thyroid Function Tests: No results for input(s): TSH, T4TOTAL, FREET4, T3FREE, THYROIDAB in the last 72 hours. Lipid Profile: No results for input(s): CHOL, HDL, LDLCALC, TRIG, CHOLHDL, LDLDIRECT in the last 72 hours. Anemia Panel: No results for input(s): VITAMINB12, FOLATE, FERRITIN, TIBC, IRON, RETICCTPCT in the last 72 hours. Urine analysis:    Component  Value Date/Time   COLORURINE YELLOW 02/22/2019 1226   APPEARANCEUR CLEAR 02/22/2019 1226   LABSPEC 1.019 02/22/2019 1226   PHURINE 7.0 02/22/2019 1226   GLUCOSEU NEGATIVE 02/22/2019 1226   HGBUR NEGATIVE 02/22/2019 1226   BILIRUBINUR NEGATIVE 02/22/2019 1226   BILIRUBINUR negative 02/05/2019 1610   KETONESUR NEGATIVE 02/22/2019 1226   PROTEINUR NEGATIVE 02/22/2019 1226   UROBILINOGEN 0.2 02/05/2019 1610   UROBILINOGEN 0.2 03/21/2007 0944   NITRITE NEGATIVE 02/22/2019 1226   LEUKOCYTESUR  NEGATIVE 02/22/2019 1226   Sepsis Labs: Invalid input(s): PROCALCITONIN, [place]  Microbiology: Recent Results (from the past 240 hour(s))  Anaerobic culture     Status: None   Collection Time: 03/01/19  3:17 PM   Specimen: PATH Cytology CSF; Cerebrospinal Fluid  Result Value Ref Range Status   Specimen Description   Final    CSF Performed at [facility], [address], [place], [place] [address]    Special Requests   Final    NONE Performed at [facility], [place] [address] [place], [place] [address]    Culture   Final    NO ANAEROBES ISOLATED Performed at [facility], [place] [address], [place], [place] [address]    Report Status 03/06/2019 FINAL  Final  CSF culture     Status: None   Collection Time: 03/01/19  3:17 PM   Specimen: PATH Cytology CSF; Cerebrospinal Fluid  Result Value Ref Range Status   Specimen Description   Final    CSF Performed at [facility], [place] [address], [place], [place] [address]    Special Requests   Final    NONE Performed at [facility], [place] [address], [place], [place] [address]    Gram Stain   Final    NO WBC SEEN NO ORGANISMS SEEN CYTOSPIN SMEAR Gram Stain Report Called to,Read Back By and Verified With: EDWARDS,J. RN @1713  ON 01.07.2021 BY COHEN,K Performed at [facility], [place] [address], [place], [place] [address]    Culture    Final    NO GROWTH 3 DAYS Performed at [facility], [place] [address] [place], [place] [address]    Report Status 03/05/2019 FINAL  Final    Radiology Studies: No results found.   Jerrad Mendibles T. [place]  If 7PM-7AM, please contact night-coverage [email or web address] Password [email or web address] 03/07/2019, 1:51 PM

## 2019-03-07 NOTE — Progress Notes (Addendum)
Pt more alert and awake after Haldol last night. Pt ate 1/4 cup of apple sauce with meds, tol well without coughing. Pt continues to have decreased appetite and eats small 20-25% of meals. Will continue to enc intake. MD made aware and notified Dietician to review per MD request. SRP, RN

## 2019-03-07 NOTE — Progress Notes (Signed)
Nutrition Follow-up  RD working remotely.   DOCUMENTATION CODES:   Not applicable  INTERVENTION:  - will adjust TF regimen to meet ~90% estimated nutrition needs as would like to continue to encourage PO intakes, feeling of hunger to promote intakes.  - will order Osmolite 1.5 @ 60 ml/hr x18 hours/day (1500-0900) to provide 1620 kcal (90% estimated kcal need), 68 grams protein (85% estimated protein need), and 823 ml free water.  - continue Ensure Enlive TID and Magic Cup BID.   NUTRITION DIAGNOSIS:   Increased nutrient needs related to acute illness(COVID-19) as evidenced by estimated needs. -ongoing  GOAL:   Patient will meet greater than or equal to 90% of their needs- to be met with TF regimen and minimal PO intakes.   MONITOR:   TF tolerance, PO intake, Labs, Weight trends  REASON FOR ASSESSMENT:   Consult Enteral/tube feeding initiation and management  ASSESSMENT:   86 y.o. male with medical history of advanced dementia, HTN, hyperlipidemia, GERD, and BPH. He was admitted on 12/31 with generalized weakness, progressive over several weeks.  In the ED, he had low-grade temperature and was noted to be hypoxic with a room air saturation of 84% associated with tachycardia.  CXR was clear and CT was negative for acute infarct.  CT abdomen/pelvis showed a splenic mass and retroperitoneal adenopathy along with patchy bilateral airspace disease consistent with viral pna.  COVID-19 testing was positive as was RPR and HIV testing (awaiting confirmatory tests).  Patient assessed by this RD on 1/11 for TF order. Order currently in place for Osmolite 1.5 @ 60 x15 hours/day (1800-0900) which provides 1350 kcal (75% estimated kcal need), 56 grams protein (70% estimated protein need), and 686 ml free water.  Patient continues with mainly 0-25% meal intake. RN note this afternoon, patient is mainly taking bites-20% of foods and beverages and he often is not alert enough to attempt further PO  intake.   Weight stable 1/10-1/12; not weighed today. Will adjust TF regimen as outlined above.    Labs reviewed; CBGs: 152, 113, 102, and 97 mg/dl, Na: 133 mmol/l, creatinine: 0.6 mg/dl, Ca: 8.3 mg/dl, Mg: 2.5 mg/dl. Medications reviewed; 500 mg ascorbic acid/day, daily multivitamin with minerals, 1 packet miralax/day, 1 tablet senokot BID, 220 mg zinc sulfate/day.     NUTRITION - FOCUSED PHYSICAL EXAM:  unable to complete at this time.   Diet Order:   Diet Order            DIET DYS 3 Room service appropriate? Yes; Fluid consistency: Nectar Thick  Diet effective now              EDUCATION NEEDS:   No education needs have been identified at this time  Skin:  Skin Assessment: Reviewed RN Assessment  Last BM:  1/9  Height:   Ht Readings from Last 1 Encounters:  02/22/19 '5\' 6"'$  (1.676 m)    Weight:   Wt Readings from Last 1 Encounters:  03/06/19 67.6 kg    Ideal Body Weight:  64.5 kg  BMI:  Body mass index is 24.05 kg/m.  Estimated Nutritional Needs:   Kcal:  1790-2000 kcal  Protein:  80-90 grams  Fluid:  >/= 2 L/day     Jarome Matin, MS, RD, LDN, [facility] Inpatient Clinical Dietitian Pager # [phone] After hours/weekend pager # [phone]

## 2019-03-07 NOTE — Care Management Important Message (Signed)
Important Message  Patient Details IM Letter given to Gabriel Earing RN Case Manager to present to the Patient Name: [name] MRN: [id number] Date of Birth: [birth year]   Medicare Important Message Given:  Yes     Kerin Salen 03/07/2019, 12:28 PM

## 2019-03-07 NOTE — Progress Notes (Signed)
Pt is not eating adequate, less than 50%, pt eating a few bites to 10%-20% or food and liquids, tube feeding restarted, CBG low, pt arouses to name, then drifts to sleep intermittently. SRP, RN

## 2019-03-08 LAB — GLUCOSE, CAPILLARY
Glucose-Capillary: 102 mg/dL — ABNORMAL HIGH (ref 70–99)
Glucose-Capillary: 110 mg/dL — ABNORMAL HIGH (ref 70–99)
Glucose-Capillary: 130 mg/dL — ABNORMAL HIGH (ref 70–99)
Glucose-Capillary: 136 mg/dL — ABNORMAL HIGH (ref 70–99)
Glucose-Capillary: 141 mg/dL — ABNORMAL HIGH (ref 70–99)
Glucose-Capillary: 142 mg/dL — ABNORMAL HIGH (ref 70–99)
Glucose-Capillary: 92 mg/dL (ref 70–99)

## 2019-03-08 LAB — BASIC METABOLIC PANEL
Anion gap: 7 (ref 5–15)
BUN: 22 mg/dL (ref 8–23)
CO2: 26 mmol/L (ref 22–32)
Calcium: 8.3 mg/dL — ABNORMAL LOW (ref 8.9–10.3)
Chloride: 98 mmol/L (ref 98–111)
Creatinine, Ser: 0.51 mg/dL — ABNORMAL LOW (ref 0.61–1.24)
GFR calc Af Amer: >60 mL/min (ref >60)
GFR calc non Af Amer: >60 mL/min (ref >60)
Glucose, Bld: 132 mg/dL — ABNORMAL HIGH (ref 70–99)
Potassium: 4 mmol/L (ref 3.5–5.1)
Sodium: 131 mmol/L — ABNORMAL LOW (ref 135–145)

## 2019-03-08 LAB — HEPATITIS B CORE ANTIBODY, IGM: Hep B C IgM: NONREACTIVE

## 2019-03-08 LAB — D-DIMER, QUANTITATIVE: D-Dimer, Quant: 3.24 ug/mL-FEU — ABNORMAL HIGH (ref 0.00–0.50)

## 2019-03-08 LAB — MAGNESIUM: Magnesium: 2.4 mg/dL (ref 1.7–2.4)

## 2019-03-08 LAB — HEPATITIS B SURFACE ANTIGEN: Hepatitis B Surface Ag: NONREACTIVE

## 2019-03-08 LAB — PHOSPHORUS: Phosphorus: 3.1 mg/dL (ref 2.5–4.6)

## 2019-03-08 MED ORDER — DEXTROSE-NACL 5-0.9 % IV SOLN: INTRAVENOUS | Status: DC

## 2019-03-08 NOTE — Progress Notes (Signed)
PROGRESS NOTE  [name] [id number] DOB: 1928/03/13   PCP: [name], MD  Patient is from: Home  DOA: 02/22/2019 LOS: 93  Brief Narrative / Interim history:  86 y.o. male with a PMH of advanced dementia, hypertension, hyperlipidemia, GERD and BPH who was admitted 02/22/2019 with a chief complaint of generalized weakness progressive over several weeks.  In the ED, he had low-grade temperature but was noted to be hypoxic with a room air saturation of 84% associated with tachycardia.  Chest x-ray was clear and CT was negative for acute infarct.  CT of the abdomen and pelvis showed a splenic mass and retroperitoneal adenopathy along with patchy bilateral airspace disease consistent with viral pneumonia.  COVID-19 testing was positive as was RPR and HIV testing.  T. Pallidum positive. However, RPR titer 1:1. Unclear if this was treated or untreated.  Confirmatory HIV test negative.  Infectious disease, IR and oncology consulted.  He underwent lumbar puncture on 02/28/2018.  Cytology not indicated for specific etiology.  CSF protein elevated to 100.  Gram stain and cultures negative so far.  VDRL pending.  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  Patient had biopsy of supraclavicular lymph nodes on 02/27/2018.  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  Oncology discussed about treatment options.  Plan is to wait until patient improves clinically.   Subjective: No major events overnight of this morning.  Awake and alert.  Oriented to self and partial place.  He knows he is in the hospital in [place].  No complaints.  Objective: Vitals:   03/07/19 1127 03/07/19 2050 03/08/19 0416 03/08/19 0418  BP: 114/67 124/68  (!) 113/58  Pulse: 87 89 82 85  Resp: 18 20 18    Temp: 98.1 F (36.7 C) 98 F (36.7 C) 98.2 F (36.8 C)   TempSrc: Oral     SpO2: 99% 98% 98% 98%  Weight:        Height:        Intake/Output Summary (Last 24 hours) at 03/08/2019 1237 Last data filed at 03/08/2019 0600 Gross per 24 hour  Intake 1437.6 ml  Output 1200 ml  Net 237.6 ml   Filed Weights   03/04/19 1138 03/05/19 0500 03/06/19 0415  Weight: 67.1 kg 68.5 kg 67.6 kg    Examination:  GENERAL: Frail and chronically ill-appearing.  No apparent distress. HEENT: MMM.  Vision and hearing grossly intact.  NECK: Supple.  No apparent JVD.  RESP:  No IWOB.  Fair aeration bilaterally. CVS:  RRR. Heart sounds normal.  ABD/GI/GU: Bowel sounds present. Soft. Non tender.  MSK/EXT:  Moves extremities. No apparent deformity. No edema.  SKIN: no apparent skin lesion or wound NEURO: Awake, alert and oriented self and partial place.  No apparent focal neuro deficits. PSYCH: Calm. Normal affect.  Procedures:  None  Assessment & Plan: Pneumonia due to COVID-19 infection: CT chest with bibasilar groundglass opacities.  CRP normalized.  D-dimer remains elevated.  No respiratory distress.  Remained stable on room air. Recent Labs    03/06/19 0519 03/07/19 0502 03/08/19 0402  DDIMER 3.41* 3.15* 3.24*  -Remdesivir 1/1-1/6 -Decadron 1/1>> 1/10 -Subcu Lovenox for VTE prophylaxis. -Discontinued airborne and contact precautions.  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  Ammonia 36>14.  Thiamine and vitamin B12 normal.  COVID-19 and acute hospitalization could contribute.  Alert, oriented x2 today. -Continue low-dose Seroquel at bedtime  -  Delirium precautions -Bowel regimen with lactulose as needed -Treating-tertiary syphilis as below.  Possible neurosyphilis: RPR and T. Pallidum positive although low titer.  CSF protein elevated to 100.  Cytology not indicated for specific etiology.  CSF Gram stain and cultures negative so far.  CSF VDRL negative but this does not exclude per ID. -ID started continuous penicillin G infusion on 1/8 for a  total of 14 days.  PICC line placed on 1/9.  Large-cell B-cell non-Hodgkin's lymphoma: Confirmed by supraclavicular lymph node biopsy on 1/6. Splenic mass/retroperitoneal right healer and left supraclavicular adenopathy likely due to lymphoma -Oncology discussed treatment options with family 1/13.  Plan is to wait until he improves from other medical condition and really address this outpatient. -Oncology wanted to maintain PICC line after completion of IV penicillin for potential chemotherapy.  Hypotension: Normotensive. -Continue holding Avapro.  Glaucoma -Continue home eyedrops.  BPH: Not on medication at home. -Monitor urinary output.  Physical deconditioning/debility: -Supportive care.  Goal of care: Patient remains full code per family wish although this would pose more harm than benefit in light of patient's condition.  Palliative care following. -Appreciate palliative input-full code and full scope of care -IR consulted for PEG tube placement after discussion with family    Moderate protein calorie malnutrition -Continue to feed via NG tube Nutrition Problem: Increased nutrient needs Etiology: acute illness(COVID-19)  Signs/Symptoms: estimated needs  Interventions: Tube feeding, Magic cup, Ensure Enlive (each supplement provides 350kcal and 20 grams of protein)   DVT prophylaxis: Subcu Lovenox Code Status: Full code Family Communication: Discussed with patient's son over the phone on 1/10 Disposition Plan: Remains inpatient pending PEG tube placement.  Final disposition SNF. Consultants: IR (off), ID (off), oncology, palliative care    Microbiology summarized: [id number] positive. Urine culture with multiple species.  Sch Meds:  Scheduled Meds: . vitamin C  500 mg Oral Daily  . aspirin EC  81 mg Oral Daily  . atorvastatin  5 mg Oral Daily  . brinzolamide  1 drop Both Eyes TID  . Chlorhexidine Gluconate Cloth  6 each Topical Daily  . Chlorhexidine Gluconate  Cloth  6 each Topical Daily  . donepezil  10 mg Oral QHS  . enoxaparin (LOVENOX) injection  40 mg Subcutaneous Daily  . feeding supplement (ENSURE ENLIVE)  237 mL Oral TID BM  . feeding supplement (OSMOLITE 1.5 CAL)  1,000 mL Per Tube Q24H  . latanoprost  1 drop Both Eyes QHS  . multivitamin with minerals  1 tablet Oral Daily  . polyethylene glycol  17 g Oral Daily  . QUEtiapine  25 mg Oral QHS  . senna-docusate  1 tablet Oral BID  . zinc sulfate  220 mg Oral Daily   Continuous Infusions: . penicillin g continuous IV infusion 12 Million Units (03/08/19 0944)   PRN Meds:.lactulose **OR** lactulose, sodium chloride flush  Antimicrobials: Anti-infectives (From admission, onward)   Start     Dose/Rate Route Frequency Ordered Stop   03/02/19 1800  penicillin G potassium 12 Million Units in dextrose 5 % 500 mL continuous infusion     12 Million Units 41.7 mL/hr over 12 Hours Intravenous Every 12 hours 03/02/19 1514 03/16/19 1759   02/24/19 1000  remdesivir 100 mg in sodium chloride 0.9 % 100 mL IVPB     100 mg 200 mL/hr over 30 Minutes Intravenous Daily 02/23/19 1519 02/27/19 1120   02/23/19 1600  cefTRIAXone (ROCEPHIN) 1 g in sodium chloride 0.9 % 100 mL IVPB  Status:  Discontinued  1 g 200 mL/hr over 30 Minutes Intravenous Every 24 hours 02/22/19 1751 02/23/19 0818   02/23/19 1600  remdesivir 200 mg in sodium chloride 0.9% 250 mL IVPB     200 mg 580 mL/hr over 30 Minutes Intravenous Once 02/23/19 1519 02/23/19 1624   02/23/19 1500  azithromycin (ZITHROMAX) 500 mg in sodium chloride 0.9 % 250 mL IVPB  Status:  Discontinued     500 mg 250 mL/hr over 60 Minutes Intravenous Every 24 hours 02/22/19 1751 02/23/19 0818   02/22/19 1630  cefTRIAXone (ROCEPHIN) 1 g in sodium chloride 0.9 % 100 mL IVPB     1 g 200 mL/hr over 30 Minutes Intravenous  Once 02/22/19 1615 02/22/19 1729   02/22/19 1630  azithromycin (ZITHROMAX) 500 mg in sodium chloride 0.9 % 250 mL IVPB     500 mg 250 mL/hr  over 60 Minutes Intravenous  Once 02/22/19 1615 02/22/19 1902       I have personally reviewed the following labs and images: CBC: Recent Labs  Lab 03/02/19 0255 03/03/19 0256  WBC 11.1* 12.2*  HGB 13.3 13.4  HCT 41.2 41.3  MCV 94.7 94.9  PLT 203 PLATELET CLUMPS NOTED ON SMEAR, COUNT APPEARS ADEQUATE   BMP &GFR Recent Labs  Lab 03/04/19 0418 03/05/19 0430 03/06/19 0519 03/07/19 0502 03/08/19 0402  NA 137 135 133* 133* 131*  K 4.4 4.3 4.1 3.9 4.0  CL 104 101 99 98 98  CO2 26 26 28 27 26   GLUCOSE 139* 159* 102* 136* 132*  BUN 24* 33* 30* 23 22  CREATININE 0.60* 0.68 0.59* 0.60* 0.51*  CALCIUM 8.5* 8.6* 8.5* 8.3* 8.3*  MG 2.7* 2.5* 2.3 2.5* 2.4  PHOS  --  2.4* 2.7 3.1 3.1   Estimated Creatinine Clearance: 57.6 mL/min (A) (by C-G formula based on SCr of 0.51 mg/dL (L)). Liver & Pancreas: Recent Labs  Lab 03/02/19 0255 03/03/19 0256 03/04/19 0418  AST 45* 42* 39  ALT 39 38 39  ALKPHOS 66 69 70  BILITOT 1.1 1.0 0.9  PROT 5.8* 6.0* 5.9*  ALBUMIN 2.6* 2.7* 2.6*   No results for input(s): LIPASE, AMYLASE in the last 168 hours. Recent Labs  Lab 03/05/19 0400  AMMONIA 14   Diabetic: No results for input(s): HGBA1C in the last 72 hours. Recent Labs  Lab 03/07/19 2048 03/08/19 0021 03/08/19 0413 03/08/19 0838 03/08/19 1134  GLUCAP 124* 130* 141* 92 142*   Cardiac Enzymes: No results for input(s): CKTOTAL, CKMB, CKMBINDEX, TROPONINI in the last 168 hours. No results for input(s): PROBNP in the last 8760 hours. Coagulation Profile: No results for input(s): INR, PROTIME in the last 168 hours. Thyroid Function Tests: No results for input(s): TSH, T4TOTAL, FREET4, T3FREE, THYROIDAB in the last 72 hours. Lipid Profile: No results for input(s): CHOL, HDL, LDLCALC, TRIG, CHOLHDL, LDLDIRECT in the last 72 hours. Anemia Panel: No results for input(s): VITAMINB12, FOLATE, FERRITIN, TIBC, IRON, RETICCTPCT in the last 72 hours. Urine analysis:    Component Value  Date/Time   COLORURINE YELLOW 02/22/2019 1226   APPEARANCEUR CLEAR 02/22/2019 1226   LABSPEC 1.019 02/22/2019 1226   PHURINE 7.0 02/22/2019 1226   GLUCOSEU NEGATIVE 02/22/2019 1226   HGBUR NEGATIVE 02/22/2019 1226   BILIRUBINUR NEGATIVE 02/22/2019 1226   BILIRUBINUR negative 02/05/2019 1610   KETONESUR NEGATIVE 02/22/2019 1226   PROTEINUR NEGATIVE 02/22/2019 1226   UROBILINOGEN 0.2 02/05/2019 1610   UROBILINOGEN 0.2 03/21/2007 0944   NITRITE NEGATIVE 02/22/2019 1226   LEUKOCYTESUR NEGATIVE 02/22/2019 1226  Sepsis Labs: Invalid input(s): PROCALCITONIN, [place]  Microbiology: Recent Results (from the past 240 hour(s))  Anaerobic culture     Status: None   Collection Time: 03/01/19  3:17 PM   Specimen: PATH Cytology CSF; Cerebrospinal Fluid  Result Value Ref Range Status   Specimen Description   Final    CSF Performed at [facility], [address] [place], [place] [address]    Special Requests   Final    NONE Performed at [facility], [place] [address] [place], [place] [address]    Culture   Final    NO ANAEROBES ISOLATED Performed at [facility], [place] [address] [place], [place] [address]    Report Status 03/06/2019 FINAL  Final  CSF culture     Status: None   Collection Time: 03/01/19  3:17 PM   Specimen: PATH Cytology CSF; Cerebrospinal Fluid  Result Value Ref Range Status   Specimen Description   Final    CSF Performed at [facility], [place] [address] [place], [place] [address]    Special Requests   Final    NONE Performed at [facility], [place] [address], [place],  [address]    Gram Stain   Final    NO WBC SEEN NO ORGANISMS SEEN CYTOSPIN SMEAR Gram Stain Report Called to,Read Back By and Verified With: EDWARDS,J. RN @1713  ON 01.07.2021 BY COHEN,K Performed at [facility], [place] [address] [place],  [address]    Culture   Final     NO GROWTH 3 DAYS Performed at [facility], [place] [address], [place],  [address]    Report Status 03/05/2019 FINAL  Final    Radiology Studies: No results found.   Yama Nielson T. Wayne  If 7PM-7AM, please contact night-coverage [email or web address] Password [facility] 03/08/2019, 12:37 PM

## 2019-03-08 NOTE — Progress Notes (Signed)
Physical Therapy Treatment Patient Details Name: [name] MRN: [id number] DOB: [birth year] Today's Date: 03/08/2019    History of Present Illness Pt admitted from home Covid + and with hx of dementia.  Pt now additionally being treated for non-Hodgkin's lymphoma, encephalopathy, and possible neurosyphilis.  Pt now with NG feeding tube and per notes to get PEG tube tomorrow.    PT Comments    Pt limited by fatigue.  He required increased assistance for transfers and for EOB balance.  Required max A of 2 to partially stand.  Needs multimodal cues and increased time.  Cont as able.    Follow Up Recommendations  SNF     Equipment Recommendations  None recommended by PT    Recommendations for Other Services       Precautions / Restrictions Precautions Precautions: Fall Precaution Comments: NG feeding tube    Mobility  Bed Mobility Overal bed mobility: Needs Assistance Bed Mobility: Supine to Sit;Sit to Supine     Supine to sit: +2 for physical assistance;HOB elevated;Mod assist Sit to supine: +2 for physical assistance;HOB elevated;Mod assist   General bed mobility comments: assist for legs and to elevate trunk  Transfers Overall transfer level: Needs assistance Equipment used: Rolling walker (2 wheeled) Transfers: Sit to/from Stand Sit to Stand: Max assist;+2 physical assistance;From elevated surface         General transfer comment: verbal and visual cues to lean forward; assist for hand placement; unable to maintain standing - requiring max x 2 while sheets changed (sheets and gown wet, catheter leaked - notified RN)  Ambulation/Gait             General Gait Details: unable   Stairs             Wheelchair Mobility    Modified Rankin (Stroke Patients Only)       Balance Overall balance assessment: Needs assistance Sitting-balance support: Feet supported;Bilateral upper extremity supported Sitting balance-Leahy Scale: Zero Sitting balance  - Comments: Pt leaning R and posterior initially and then too far forward - requiring mod-max A to maintain; EOB 5 mins; tried to cue and position upright, pt held upright position for ~5-10 seconds with min guard     Standing balance-Leahy Scale: Zero Standing balance comment: dependent on external assist from staff                            Cognition Arousal/Alertness: Awake/alert Behavior During Therapy: WFL for tasks assessed/performed Overall Cognitive Status: No family/caregiver present to determine baseline cognitive functioning                                 General Comments: Limited due to [facility] and dementia in negative pressure room with window unit.  Pt following ~75% of multi modal commands      Exercises      General Comments        Pertinent Vitals/Pain Pain Assessment: No/denies pain    Home Living                      Prior Function            PT Goals (current goals can now be found in the care plan section) Progress towards PT goals: Not progressing toward goals - comment(pt to get PEG tube tomorrow)    Frequency    Min 2X/week  PT Plan Current plan remains appropriate    Co-evaluation              AM-PAC PT "6 Clicks" Mobility   Outcome Measure  Help needed turning from your back to your side while in a flat bed without using bedrails?: Total Help needed moving from lying on your back to sitting on the side of a flat bed without using bedrails?: Total Help needed moving to and from a bed to a chair (including a wheelchair)?: Total Help needed standing up from a chair using your arms (e.g., wheelchair or bedside chair)?: Total Help needed to walk in hospital room?: Total Help needed climbing 3-5 steps with a railing? : Total 6 Click Score: 6    End of Session Equipment Utilized During Treatment: Gait belt Activity Tolerance: Patient limited by fatigue Patient left: in bed;with call bell/phone  within reach;with bed alarm set Nurse Communication: Mobility status PT Visit Diagnosis: Muscle weakness (generalized) (M62.81);Difficulty in walking, not elsewhere classified (R26.2)     Time: [id number] PT Time Calculation (min) (ACUTE ONLY): 20 min  Charges:  $Therapeutic Activity: 8-22 mins                     Maggie Font, PT Acute Rehab Services Pager [phone] [facility] [phone] [facility] [phone]    [name] 03/08/2019, 5:19 PM

## 2019-03-08 NOTE — Progress Notes (Signed)
Pt son Dak Cheung called and confirmed after speaking with pt daughter Glenda Chroman and spouse Brave Forgey, all agree to have PEG placement. MD updated. SRP, RN

## 2019-03-08 NOTE — Progress Notes (Signed)
IR requested by Dr. Cyndia Skeeters for possible image-guided percutaneous gastrostomy tube.  Case has been reviewed by Dr. Anselm Pancoast who approves procedure. Plan for procedure tentatively for 03/09/2019 in IR. Patient to be NPO 03/09/2019 at 0001. Hold Lovenox. Consent obtained by patient's wife via telephone, signed and in IR binder. PA to see patient prior to procedure tomorrow.  Risks and benefits discussed with the patient including, but not limited to the need for a barium enema during the procedure, bleeding, infection, peritonitis, or damage to adjacent structures. All of the patient's wife's questions were answered, she is agreeable to proceed. Consent signed and in chart.  Please call IR with questions/concerns.   Bea Graff Manila Rommel, PA-C 03/08/2019, 3:14 PM

## 2019-03-08 NOTE — TOC Progression Note (Signed)
Transition of Care [facility]) - Progression Note    Patient Details  Name: [name] MRN: [id number] Date of Birth: 11-27-1928  [facility] CM/SW Contact  Purcell Mouton, RN Phone Number: 03/08/2019, 1:14 PM  Clinical Narrative:    Pt continue not to be stable for discharge. TOC will continue to follow.         Expected Discharge Plan and Services                                                 Social Determinants of Health (SDOH) Interventions    Readmission Risk Interventions No flowsheet data found.

## 2019-03-08 NOTE — NC FL2 (Signed)
[place] LEVEL OF CARE SCREENING TOOL     IDENTIFICATION  Patient Name: [name] Birthdate: [birth year] Sex: male Admission Date (Current Location): 02/22/2019  [facility] and [place] Number:  Herbalist and Address:  [facility],  [place], [place]      Provider Number: [id number]  Attending Physician Name and Address:  Mercy Riding, MD  Relative Name and Phone Number:  Corden Lay [phone]    Current Level of Care: Hospital Recommended Level of Care: [place] Prior Approval Number:    Date Approved/Denied:   PASRR Number:    Discharge Plan: SNF    Current Diagnoses: Patient Active Problem List   Diagnosis Date Noted  . Diffuse large cell non-Hodgkin's lymphoma ([place]) 03/07/2019  . Weakness 02/23/2019  . Hypokalemia 02/23/2019  . Lab test positive for detection of COVID-19 virus 02/23/2019  . Cerebral atrophy ([place]) 02/23/2019  . Paranasal sinus disease 02/23/2019  . Retroperitoneal lymphadenopathy 02/23/2019  . Splenic mass 02/23/2019  . Pneumonia due to COVID-19 virus 02/23/2019  . Advanced dementia ([place]) 02/23/2019  . Delirium due to another medical condition, acute, hyperactive 02/23/2019  . Loss of appetite 02/23/2019  . Hyperlipidemia 02/23/2019  . GERD (gastroesophageal reflux disease) 02/23/2019  . HIV test positive ([place]) 02/23/2019  . Aorto-iliac atherosclerosis ([place]) 02/08/2019  . Constipation 02/08/2019  . Lumbar scoliosis 04/20/2018  . Prostate nodule 04/20/2018  . Hearing loss 10/19/2017  . Benign prostatic hyperplasia with urinary obstruction   . Skin lesions 10/10/2014  . Lower back pain 10/10/2014  . Advanced care planning/counseling discussion 03/20/2014  . MCI (mild cognitive impairment) with memory loss 03/20/2014  . Medicare annual wellness visit, subsequent 01/04/2012  . Cardiac murmur 01/04/2012  . Dyslipidemia 03/31/2010  . Glaucoma 03/31/2010  . Essential  hypertension 03/31/2010  . Allergic rhinitis 03/31/2010  . SMALL BOWEL OBSTRUCTION, HX OF 03/31/2010  . NEPHROLITHIASIS, HX OF 03/31/2010    Orientation RESPIRATION BLADDER Height & Weight     Self  Normal Incontinent, External catheter Weight: 67.6 kg Height:  5\' 6"  (167.6 cm)  BEHAVIORAL SYMPTOMS/MOOD NEUROLOGICAL BOWEL NUTRITION STATUS      Incontinent Diet(Regular diet)  AMBULATORY STATUS COMMUNICATION OF NEEDS Skin   Extensive Assist Verbally Other (Comment)(Ecchymosis on abdomen, bilateral arms and legs. Rash bilateral groin area.)                       Personal Care Assistance Level of Assistance  Bathing, Feeding, Dressing Bathing Assistance: Maximum assistance Feeding assistance: Maximum assistance(Peg Tube) Dressing Assistance: Maximum assistance     Functional Limitations Info  Sight, Hearing, Speech Sight Info: Impaired(Glucoma bilateral right and left eyes) Hearing Info: Impaired(HOH hearing Aids) Speech Info: Adequate    SPECIAL CARE FACTORS FREQUENCY  PT (By licensed PT), OT (By licensed OT)     PT Frequency: Eval and Treat OT Frequency: Eval and Treat            Contractures Contractures Info: Not present    Additional Factors Info  Code Status, Allergies Code Status Info: FULL Allergies Info: No Known Allergies           Current Medications (03/08/2019):  This is the current hospital active medication list Current Facility-Administered Medications  Medication Dose Route Frequency Provider Last Rate Last Admin  . ascorbic acid (VITAMIN C) tablet 500 mg  500 mg Oral Daily Rama, Venetia Maxon, MD   500 mg at 03/08/19 1014  .  aspirin EC tablet 81 mg  81 mg Oral Daily Hosie Poisson, MD   81 mg at 03/08/19 1014  . atorvastatin (LIPITOR) tablet 5 mg  5 mg Oral Daily Hosie Poisson, MD   5 mg at 03/08/19 1014  . brinzolamide (AZOPT) 1 % ophthalmic suspension 1 drop  1 drop Both Eyes TID Hosie Poisson, MD   1 drop at 03/08/19 0945  . Chlorhexidine  Gluconate Cloth 2 % PADS 6 each  6 each Topical Daily Magrinat, Virgie Dad, MD   6 each at 03/06/19 1230  . Chlorhexidine Gluconate Cloth 2 % PADS 6 each  6 each Topical Daily Magrinat, Virgie Dad, MD   6 each at 03/08/19 1021  . donepezil (ARICEPT) tablet 10 mg  10 mg Oral QHS Hosie Poisson, MD   10 mg at 03/07/19 2150  . feeding supplement (ENSURE ENLIVE) (ENSURE ENLIVE) liquid 237 mL  237 mL Oral TID BM Wendee Beavers T, MD   237 mL at 03/07/19 2020  . feeding supplement (OSMOLITE 1.5 CAL) liquid 1,000 mL  1,000 mL Per Tube Q24H Wendee Beavers T, MD 60 mL/hr at 03/07/19 1357 1,000 mL at 03/07/19 1357  . lactulose (CHRONULAC) 10 GM/15ML solution 30 g  30 g Oral BID PRN Gonfa, Taye T, MD       Or  . lactulose (CHRONULAC) enema 200 gm  300 mL Rectal BID PRN Gonfa, Taye T, MD      . latanoprost (XALATAN) 0.005 % ophthalmic solution 1 drop  1 drop Both Eyes QHS Hosie Poisson, MD   1 drop at 03/07/19 2150  . multivitamin with minerals tablet 1 tablet  1 tablet Oral Daily Lavina Hamman, MD   1 tablet at 03/08/19 1014  . penicillin G potassium 12 Million Units in dextrose 5 % 500 mL continuous infusion  12 Million Units Intravenous Q12H Thayer Headings, MD 41.7 mL/hr at 03/08/19 0944 12 Million Units at 03/08/19 0944  . polyethylene glycol (MIRALAX / GLYCOLAX) packet 17 g  17 g Oral Daily Lavina Hamman, MD   17 g at 03/08/19 1027  . QUEtiapine (SEROQUEL) tablet 25 mg  25 mg Oral QHS Lavina Hamman, MD   25 mg at 03/07/19 2151  . senna-docusate (Senokot-S) tablet 1 tablet  1 tablet Oral BID Lavina Hamman, MD   1 tablet at 03/08/19 1014  . sodium chloride flush (NS) 0.9 % injection 10-40 mL  10-40 mL Intracatheter PRN Magrinat, Virgie Dad, MD   10 mL at 03/06/19 0520  . zinc sulfate capsule 220 mg  220 mg Oral Daily Rama, Venetia Maxon, MD   220 mg at 03/08/19 1014     Discharge Medications: Please see discharge summary for a list of discharge medications.  Relevant Imaging Results:  Relevant Lab  Results:   Additional Information [phone]  Purcell Mouton, RN

## 2019-03-08 NOTE — Plan of Care (Signed)
  Problem: Clinical Measurements: Goal: Ability to maintain clinical measurements within normal limits will improve Outcome: Not Progressing Goal: Will remain free from infection Outcome: Not Progressing Goal: Respiratory complications will improve Outcome: Not Progressing Goal: Cardiovascular complication will be avoided Outcome: Not Progressing   Problem: Activity: Goal: Risk for activity intolerance will decrease Outcome: Not Progressing

## 2019-03-09 ENCOUNTER — Inpatient Hospital Stay (HOSPITAL_COMMUNITY): Payer: Medicare Other

## 2019-03-09 HISTORY — PX: IR GASTROSTOMY TUBE MOD SED: IMG625

## 2019-03-09 LAB — GLUCOSE, CAPILLARY
Glucose-Capillary: 107 mg/dL — ABNORMAL HIGH (ref 70–99)
Glucose-Capillary: 128 mg/dL — ABNORMAL HIGH (ref 70–99)
Glucose-Capillary: 99 mg/dL (ref 70–99)
Glucose-Capillary: 97 mg/dL (ref 70–99)
Glucose-Capillary: 88 mg/dL (ref 70–99)

## 2019-03-09 LAB — BASIC METABOLIC PANEL
Anion gap: 9 (ref 5–15)
BUN: 22 mg/dL (ref 8–23)
CO2: 25 mmol/L (ref 22–32)
Calcium: 8.6 mg/dL — ABNORMAL LOW (ref 8.9–10.3)
Chloride: 96 mmol/L — ABNORMAL LOW (ref 98–111)
Creatinine, Ser: 0.56 mg/dL — ABNORMAL LOW (ref 0.61–1.24)
GFR calc Af Amer: >60 mL/min (ref >60)
GFR calc non Af Amer: >60 mL/min (ref >60)
Glucose, Bld: 128 mg/dL — ABNORMAL HIGH (ref 70–99)
Potassium: 4.3 mmol/L (ref 3.5–5.1)
Sodium: 130 mmol/L — ABNORMAL LOW (ref 135–145)

## 2019-03-09 LAB — D-DIMER, QUANTITATIVE: D-Dimer, Quant: 3.44 ug/mL-FEU — ABNORMAL HIGH (ref 0.00–0.50)

## 2019-03-09 LAB — MAGNESIUM: Magnesium: 2.4 mg/dL (ref 1.7–2.4)

## 2019-03-09 LAB — PHOSPHORUS: Phosphorus: 3.3 mg/dL (ref 2.5–4.6)

## 2019-03-09 MED ORDER — LIDOCAINE HCL 1 % IJ SOLN
INTRAMUSCULAR | Status: AC
  Filled 2019-03-09: qty 20

## 2019-03-09 MED ORDER — GLUCAGON HCL (RDNA) 1 MG IJ SOLR
INTRAMUSCULAR | Status: AC | PRN
  Administered 2019-03-09: 1 mg via INTRAVENOUS

## 2019-03-09 MED ORDER — MIDAZOLAM HCL 2 MG/2ML IJ SOLN
INTRAMUSCULAR | Status: AC | PRN
  Administered 2019-03-09: 1 mg via INTRAVENOUS

## 2019-03-09 MED ORDER — FENTANYL CITRATE (PF) 100 MCG/2ML IJ SOLN
INTRAMUSCULAR | Status: AC
  Filled 2019-03-09: qty 2

## 2019-03-09 MED ORDER — MIDAZOLAM HCL 2 MG/2ML IJ SOLN
INTRAMUSCULAR | Status: AC
  Filled 2019-03-09: qty 2

## 2019-03-09 MED ORDER — LIDOCAINE HCL (PF) 1 % IJ SOLN
INTRAMUSCULAR | Status: AC | PRN
  Administered 2019-03-09: 10 mL

## 2019-03-09 MED ORDER — ENOXAPARIN SODIUM 40 MG/0.4ML ~~LOC~~ SOLN
40.0000 mg | Freq: Every day | SUBCUTANEOUS | Status: DC
  Administered 2019-03-10 – 2019-03-14 (×5): 40 mg via SUBCUTANEOUS
  Filled 2019-03-09 (×5): qty 0.4

## 2019-03-09 MED ORDER — GLUCAGON HCL RDNA (DIAGNOSTIC) 1 MG IJ SOLR
INTRAMUSCULAR | Status: AC
  Filled 2019-03-09: qty 1

## 2019-03-09 NOTE — H&P (Signed)
Chief Complaint: Failure to thrive  Referring Physician(s): Mercy Riding, MD  Supervising Physician: Daryll Brod  Patient Status: [facility] - In-pt  History of Present Illness: [name] is a 86 y.o. male who was brought to the ED by his son for progressive confusion and failure to thrive.  He was found to be COVID +.  He also has diffuse large cell B-cell non-Hodgkin's lymphoma  Past Medical History:  Diagnosis Date  . Allergic rhinitis   . BPH (benign prostatic hypertrophy) with urinary obstruction 2000   40gm, L lobe 43mm prostate nodule; followed yearly by Dr Diona Fanti  . GERD (gastroesophageal reflux disease)   . Glaucoma   . Herpes zoster ophthalmicus    history  . History of kidney stones remote   x 1-Ca monhydrate, hyperoxalate urine s/p lithotripsy  . HLD (hyperlipidemia)   . HTN (hypertension)   . Impaired glucose tolerance   . Impaired hearing    uses hearing aides bilaterally  . Osteopenia     Past Surgical History:  Procedure Laterality Date  . APPENDECTOMY  1997  . CATARACT EXTRACTION Right 11/1013   Dr. Venetia Maxon  . COLONOSCOPY  2002   WNL-Kaplan; repeat 2012  . COLONOSCOPY  02/2012   tubular adenoma Deatra Ina)  . [place]   left-1955; right-1970  . LITHOTRIPSY  2005  . SBO  2008, 2010    Allergies: Patient has no known allergies.  Medications: Prior to Admission medications   Medication Sig Start Date End Date Taking? Authorizing Provider  acetaminophen (TYLENOL) 500 MG tablet Take 2 tablets (1,000 mg total) by mouth at bedtime. 02/05/19  Yes [name], MD  aspirin EC 81 MG tablet Take 81 mg by mouth daily.   Yes [provider]  atorvastatin (LIPITOR) 10 MG tablet Take 0.5 tablets (5 mg total) by mouth daily. 04/20/18  Yes [name], MD  AZOPT 1 % ophthalmic suspension Place 1 drop into both eyes 3 (three) times daily. 02/20/19  Yes [provider]  donepezil (ARICEPT) 10 MG  tablet Take 1 tablet (10 mg total) by mouth at bedtime. 04/20/18  Yes [name], MD  polyethylene glycol powder [facility]) 17 GM/SCOOP powder Take 8.5-17 g by mouth daily. Hold for diarrhea Patient taking differently: Take 8.5-17 g by mouth daily as needed for mild constipation or moderate constipation. Hold for diarrhea 02/08/19  Yes [name], MD  valsartan (DIOVAN) 160 MG tablet TAKE 1 TABLET BY MOUTH DAILY 06/29/18  Yes [name], MD  VYZULTA 0.024 % SOLN Place 1 drop into both eyes at bedtime. 02/20/19  Yes [provider]  losartan (COZAAR) 100 MG tablet TAKE 1 TABLET BY MOUTH DAILY 05/02/17   [name], MD     Family History  Problem Relation Age of Onset  . Coronary artery disease Father   . Healthy Mother   . Diabetes Brother   . Breast cancer Sister   . Leukemia Maternal Uncle   . Pancreatic cancer Sister     Social History   Socioeconomic History  . Marital status: Married    Spouse name: Not on file  . Number of children: 2  . Years of education: Not on file  . Highest education level: Not on file  Occupational History  . Occupation: Marine scientist: retired  Tobacco Use  . Smoking status: Former Smoker    Types: Cigarettes  . Smokeless tobacco: Never Used  Substance and  Sexual Activity  . Alcohol use: No  . Drug use: No  . Sexual activity: Never  Other Topics Concern  . Not on file  Social History Narrative   Minimal caffeine   Lives with wife, [name]. No pets   Nutritional therapist; working for [employer], quantity currently; retired from [employer]   Edu: 12th grade education   Activity: stays active outside   Diet: good water, fruits/vegetables daily   Social Determinants of Health   Financial Resource Strain:   . Difficulty of Paying Living Expenses: Not on file  Food Insecurity:   . Worried About Charity fundraiser in the Last Year: Not on file  . Ran Out of Food in the Last Year: Not on file  Transportation Needs:   .  Lack of Transportation (Medical): Not on file  . Lack of Transportation (Non-Medical): Not on file  Physical Activity:   . Days of Exercise per Week: Not on file  . Minutes of Exercise per Session: Not on file  Stress:   . Feeling of Stress : Not on file  Social Connections:   . Frequency of Communication with Friends and Family: Not on file  . Frequency of Social Gatherings with Friends and Family: Not on file  . Attends Religious Services: Not on file  . Active Member of Clubs or Organizations: Not on file  . Attends Archivist Meetings: Not on file  . Marital Status: Not on file     Review of Systems  Unable to perform ROS: Dementia    Vital Signs: BP 133/70 (BP Location: Left Arm)   Pulse 88   Temp 98.2 F (36.8 C) (Oral)   Resp 18   Ht 5\' 6"  (1.676 m)   Wt 71.7 kg   SpO2 96%   BMI 25.50 kg/m   Physical Exam Constitutional:      Appearance: He is ill-appearing.  HENT:     Head: Normocephalic and atraumatic.  Cardiovascular:     Rate and Rhythm: Normal rate.  Pulmonary:     Effort: Pulmonary effort is normal. No respiratory distress.  Abdominal:     Palpations: Abdomen is soft.     Tenderness: There is no abdominal tenderness.  Skin:    General: Skin is warm and dry.  Neurological:     Mental Status: He is disoriented.     Imaging: DG Abd 1 View  Result Date: 03/04/2019 CLINICAL DATA:  NG tube placement EXAM: ABDOMEN - 1 VIEW COMPARISON:  08/06/2005 FINDINGS: Feeding tube coils in the stomach with the tip in the fundus. Nonobstructive bowel gas pattern. Visualized lungs clear. IMPRESSION: Feeding tube coils in the stomach with the tip in the fundus. Electronically Signed   By: Rolm Baptise M.D.   On: 03/04/2019 19:27   CT Head Wo Contrast  Result Date: 02/22/2019 CLINICAL DATA:  Altered mental status with fatigue.  Dehydration. EXAM: CT HEAD WITHOUT CONTRAST TECHNIQUE: Contiguous axial images were obtained from the base of the skull through the  vertex without intravenous contrast. COMPARISON:  None. FINDINGS: Brain: There is moderate diffuse atrophy. There is no intracranial mass, hemorrhage, extra-axial fluid collection, or midline shift. Brain parenchyma appears unremarkable. No evident acute infarct. Vascular: There is no hyperdense vessel. There are foci of calcification in each carotid siphon region. Skull: Bony calvarium appears intact. Sinuses/Orbits: There is a retention cyst in the inferior posterior right maxillary antrum. There is mucosal thickening and opacification involving multiple ethmoid air cells. Orbits appear symmetric bilaterally except  for evidence of previous cataract surgery on the right. Other: Mastoid air cells are clear. IMPRESSION: 1. Atrophy. Brain parenchyma appears unremarkable. No acute infarct. No mass or hemorrhage. 2.  Foci of arterial vascular calcification noted. 3.  Foci of paranasal sinus disease evident. Electronically Signed   By: Lowella Grip III M.D.   On: 02/22/2019 14:57   CT CHEST W CONTRAST  Result Date: 02/24/2019 CLINICAL DATA:  Provided history of colon cancer of unknown primary, surveillance Technologist notes state pneumonia due to COVID-19. EXAM: CT CHEST WITH CONTRAST TECHNIQUE: Multidetector CT imaging of the chest was performed during intravenous contrast administration. CONTRAST:  33mL OMNIPAQUE IOHEXOL 300 MG/ML  SOLN COMPARISON:  Chest radiograph 02/22/2019. Abdominal CT 02/12/2019. No remote prior chest CT. FINDINGS: Cardiovascular: Aortic atherosclerosis. No aneurysm or dissection. Coronary artery calcifications. Normal heart size. No pericardial effusion. Mediastinum/Nodes: Probable enlarged right hilar node measuring 18 mm short axis, series 2, image 66. no density appears similar to the adjacent pulmonary vasculature, but appears separate. Left supraclavicular adenopathy, largest node measuring 2 cm, series 2, image 19. No axillary adenopathy. No thyroid nodule. Esophagus slightly  patulous but no wall thickening. Lungs/Pleura: Patchy bilateral ground-glass opacities within both lungs, right greater than left. Small bilateral pleural effusions. Background lung opacity limits assessment for pulmonary nodule or mass, no dominant pulmonary mass visualized. Calcified granuloma in the right lower lobe. Mild elevation of right hemidiaphragm. Upper Abdomen: Assessed on recent abdominal CT. Retroperitoneal adenopathy. Hepatic and renal cysts and central splenic mass. Musculoskeletal: Multilevel degenerative change in the spine. There are no acute or suspicious osseous abnormalities. No evidence of lytic or blastic osseous lesion. IMPRESSION: 1. Right hilar and left supraclavicular adenopathy. Given abdominal adenopathy, findings suspicious for lymphoproliferative disorder. 2. Patchy bilateral ground-glass opacities within both lungs, right greater than left consistent with COVID pneumonia. 3. Small bilateral pleural effusions. 4. Aortic Atherosclerosis (ICD10-I70.0). Coronary artery calcifications. Electronically Signed   By: Keith Rake M.D.   On: 02/24/2019 23:25   CT ABDOMEN PELVIS W CONTRAST  Result Date: 02/22/2019 CLINICAL DATA:  Abdominal distension. Anorexia. EXAM: CT ABDOMEN AND PELVIS WITH CONTRAST TECHNIQUE: Multidetector CT imaging of the abdomen and pelvis was performed using the standard protocol following bolus administration of intravenous contrast. CONTRAST:  156mL OMNIPAQUE IOHEXOL 300 MG/ML  SOLN COMPARISON:  03/21/2007 FINDINGS: Lower Chest: Patchy areas of airspace disease are seen bilaterally mainly in the lower lobes, suspicious for viral pneumonia or inflammatory process. No evidence of pleural effusion. Hepatobiliary: No hepatic masses identified. Multiple small hepatic cysts are stable. Gallbladder is unremarkable. No evidence of biliary ductal dilatation. Pancreas:  No mass or inflammatory changes. Spleen: No evidence of splenomegaly. A new low-attenuation soft  tissue mass is seen in the central spleen which measures 3.8 x 3.4 cm. Adrenals/Urinary Tract: Several large renal cysts are again seen bilaterally. No masses identified. No evidence of ureteral calculi or hydronephrosis. Unremarkable unopacified urinary bladder. Stomach/Bowel: No evidence of obstruction, inflammatory process or abnormal fluid collections. Vascular/Lymphatic: New abdominal retroperitoneal lymphadenopathy is seen in the region of the celiac axis and throughout the left paraaortic and aortocaval spaces. Largest conglomeration of lymph nodes in the left paraaortic region measures 4.9 x 3.1 cm on image 43/2. No pathologically enlarged lymph nodes seen within the pelvis. Aortic atherosclerosis incidentally noted. No abdominal aortic aneurysm. Reproductive:  No mass or other significant abnormality identified. Other:  None. Musculoskeletal:  No suspicious bone lesions identified. IMPRESSION: 1. New abdominal retroperitoneal lymphadenopathy and splenic mass. This is likely due  to lymphoproliferative disorder, with metastatic disease considered less likely. 2. Patchy bilateral airspace disease mainly in the lower lobes, suspicious for viral pneumonia or inflammatory process. Pulmonary involvement by lymphoma is considered less likely but cannot be excluded. Recommend clinical correlation and COVID-19 virus testing. Electronically Signed   By: Marlaine Hind M.D.   On: 02/22/2019 15:05   DG Chest Portable 1 View  Result Date: 02/22/2019 CLINICAL DATA:  Increasing shortness of breath over the past 7 days EXAM: PORTABLE CHEST 1 VIEW COMPARISON:  10/17/2003 FINDINGS: Cardiac shadow is accentuated by the portable technique. Aortic calcifications are noted. Elevation the right hemidiaphragm is noted new from the prior exam. No focal infiltrate or sizable effusion is seen. No bony abnormality is noted. IMPRESSION: No active disease. Electronically Signed   By: Inez Catalina M.D.   On: 02/22/2019 14:03   [place]  CORE BIOPSY (LYMPH NODES)  Result Date: 02/28/2019 INDICATION: 86 year old male with a history supraclavicular adenopathy, possible lymphoma EXAM: ULTRASOUND-GUIDED BIOPSY OF LEFT SUPRACLAVICULAR/CERVICAL NODES MEDICATIONS: None. ANESTHESIA/SEDATION: None FLUOROSCOPY TIME:  None COMPLICATIONS: None PROCEDURE: Informed written consent was obtained from the patient after a thorough discussion of the procedural risks, benefits and alternatives. All questions were addressed. Maximal Sterile Barrier Technique was utilized including caps, mask, sterile gowns, sterile gloves, sterile drape, hand hygiene and skin antiseptic. A timeout was performed prior to the initiation of the procedure. Patient positioned supine position. Ultrasound survey was performed with images stored sent to PACs. The patient is prepped and draped in the usual sterile fashion. 1% lidocaine was used for local anesthesia. Using ultrasound guidance, multiple 18 gauge core biopsy were achieved of left supraclavicular/cervical lymph nodes. Tissues placed into fresh specimen/saline container. Final image was stored. Patient tolerated the procedure well and remained hemodynamically stable throughout. No complications were encountered and no significant blood loss. IMPRESSION: Status post ultrasound-guided biopsy of left supraclavicular lymph nodes. Signed, Dulcy Fanny. Dellia Nims, RPVI [facility] Radiology Electronically Signed   By: Corrie Mckusick D.O.   On: 02/28/2019 17:15   [place] EKG SITE RITE  Result Date: 03/02/2019 If Site Rite image not attached, placement could not be confirmed due to current cardiac rhythm.  DG FL GUIDED LUMBAR PUNCTURE  Result Date: 03/01/2019 CLINICAL DATA:  Encephalopathy.  Positive syphilis screening test. EXAM: DIAGNOSTIC LUMBAR PUNCTURE UNDER FLUOROSCOPIC GUIDANCE FLUOROSCOPY TIME:  Fluoroscopy Time:  1 minutes 18 seconds Radiation exposure: 14.0 mGy Number of Acquired Spot  Images: 0 PROCEDURE: Due to the patient's altered mental status, informed consent was obtained by telephone from the patient's son Anush Udy. With the patient prone, the lower back was prepped with Betadine. 1% Lidocaine was used for local anesthesia. Lumbar puncture was performed at the L4-5 level using a 20 gauge needle with return of clear CSF with an opening pressure of 9 cm water. 10 ml of CSF were obtained for laboratory studies. The patient tolerated the procedure well and there were no apparent complications. IMPRESSION: Successful diagnostic lumbar puncture performed at L4-5 under fluoroscopic guidance. No immediate complication. Electronically Signed   By: Marlaine Hind M.D.   On: 03/01/2019 16:03    Labs:  CBC: Recent Labs    02/28/19 0311 03/01/19 0257 03/02/19 0255 03/03/19 0256  WBC 8.8 11.5* 11.1* 12.2*  HGB 13.1 13.3 13.3 13.4  HCT 41.0 40.4 41.2 41.3  PLT PLATELET CLUMPING, SUGGEST RECOLLECTION OF SAMPLE IN CITRATE TUBE. 153 203 PLATELET CLUMPS NOTED ON SMEAR, COUNT APPEARS ADEQUATE    COAGS:  Recent Labs    02/26/19 0340 02/27/19 0319 03/01/19 0257  INR 1.2 1.2 1.2  APTT  --   --  31    BMP: Recent Labs    03/06/19 0519 03/07/19 0502 03/08/19 0402 03/09/19 0500  NA 133* 133* 131* 130*  K 4.1 3.9 4.0 4.3  CL 99 98 98 96*  CO2 28 27 26 25   GLUCOSE 102* 136* 132* 128*  BUN 30* 23 22 22   CALCIUM 8.5* 8.3* 8.3* 8.6*  CREATININE 0.59* 0.60* 0.51* 0.56*  GFRNONAA >60 >60 >60 >60  GFRAA >60 >60 >60 >60    LIVER FUNCTION TESTS: Recent Labs    03/01/19 0257 03/02/19 0255 03/03/19 0256 03/04/19 0418  BILITOT 1.0 1.1 1.0 0.9  AST 54* 45* 42* 39  ALT 43 39 38 39  ALKPHOS 64 66 69 70  PROT 5.7* 5.8* 6.0* 5.9*  ALBUMIN 2.5* 2.6* 2.7* 2.6*    TUMOR MARKERS: No results for input(s): AFPTM, CEA, CA199, CHROMGRNA in the last 8760 hours.  Assessment and Plan:  Diffuse large cell B-cell non-Hodgkin's lymphoma with failure to thrive.  Will proceed with  placement of a gastrostomy tube today by Dr. Annamaria Boots.  Risks and benefits image guided gastrostomy tube placement was discussed with the patient including, but not limited to the need for a barium enema during the procedure, bleeding, infection, peritonitis and/or damage to adjacent structures.  All of the patient's questions were answered, patient is agreeable to proceed.  Consent signed and in chart.  Thank you for this interesting consult.  I greatly enjoyed meeting VYRON SHARPLEY and look forward to participating in their care.  A copy of this report was sent to the requesting provider on this date.  Electronically Signed: Murrell Redden, PA-C   03/09/2019, 10:33 AM      I spent a total of 20 Minutes in face to face in clinical consultation, greater than 50% of which was counseling/coordinating care for gastrostomy tube.

## 2019-03-09 NOTE — TOC Progression Note (Signed)
Transition of Care [facility]) - Progression Note    Patient Details  Name: [name] MRN: [id number] Date of Birth: [birth year]  [facility]) CM/SW Contact  Purcell Mouton, RN Phone Number: 03/09/2019, 4:14 PM  Clinical Narrative:    [name] [id number] A, FL2 faxed.         Expected Discharge Plan and Services                                                 Social Determinants of Health (SDOH) Interventions    Readmission Risk Interventions No flowsheet data found.

## 2019-03-09 NOTE — Procedures (Signed)
Dysphagia  S/p FLUORO 20 FR GTUBE  No comp Stable ebl min Full use tomorrow Full report in pacs

## 2019-03-09 NOTE — Progress Notes (Signed)
PROGRESS NOTE  [name] [id number] DOB: [birth year]   PCP: [name], MD  Patient is from: Home  DOA: 02/22/2019 LOS: 30  Brief Narrative / Interim history:  86 y.o. male with a PMH of advanced dementia, hypertension, hyperlipidemia, GERD and BPH who was admitted 02/22/2019 with a chief complaint of generalized weakness progressive over several weeks.  In the ED, he had low-grade temperature but was noted to be hypoxic with a room air saturation of 84% associated with tachycardia.  Chest x-ray was clear and CT was negative for acute infarct.  CT of the abdomen and pelvis showed a splenic mass and retroperitoneal adenopathy along with patchy bilateral airspace disease consistent with viral pneumonia.  COVID-19 testing was positive as was RPR and HIV testing.  T. Pallidum positive. However, RPR titer 1:1. Unclear if this was treated or untreated.  Confirmatory HIV test negative.  Infectious disease, IR and oncology consulted.  He underwent lumbar puncture on 02/28/2018.  Cytology not indicated for specific etiology.  CSF protein elevated to 100.  Gram stain and cultures negative so far.  VDRL pending.  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  Patient had biopsy of supraclavicular lymph nodes on 02/27/2018.  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  Oncology discussed about treatment options.  Plan is to wait until patient improves clinically.   IR to place PEG tube.  Patient could be discharged to SNF once he tolerates feeding via PEG tube.  Subjective: No major events overnight of this morning.  Awake and oriented to self.  [place].  Does not appear to be in distress.  Objective: Vitals:   03/08/19 2120 03/09/19 0610 03/09/19 0614 03/09/19 1215  BP: 137/64  133/70 132/69  Pulse: 83  88 82  Resp:    20  Temp: 97.7 F (36.5 C)  98.2 F (36.8 C) 98.7 F (37.1 C)  TempSrc:  Oral  Oral Oral  SpO2: 97%  96% 97%  Weight:  71.7 kg    Height:        Intake/Output Summary (Last 24 hours) at 03/09/2019 1357 Last data filed at 03/09/2019 0900 Gross per 24 hour  Intake 2440.43 ml  Output 150 ml  Net 2290.43 ml   Filed Weights   03/05/19 0500 03/06/19 0415 03/09/19 0610  Weight: 68.5 kg 67.6 kg 71.7 kg    Examination:  GENERAL: No acute distress.  Appears well.  HEENT: MMM.  Vision grossly intact.  Impaired hearing. NECK: Supple.  No apparent JVD.  RESP: On RA.  No IWOB.  Fair aeration bilaterally. CVS:  RRR. Heart sounds normal.  ABD/GI/GU: Bowel sounds present. Soft. Non tender.  MSK/EXT: Moves extremities.  LUE swelling from IV infiltration. SKIN: no apparent skin lesion or wound NEURO: Awake, alert and oriented self.  No apparent focal neuro deficit. PSYCH: Calm.  No distress.  No agitation.  Procedures:  1/9-PICC line placement 1/15-plan for PEG tube placement by IR.  Assessment & Plan: Pneumonia due to COVID-19 infection: CT chest with bibasilar groundglass opacities.  CRP normalized.  D-dimer remains elevated.  No respiratory distress.  Remained stable on room air. Recent Labs    03/07/19 0502 03/08/19 0402 03/09/19 0500  DDIMER 3.15* 3.24* 3.44*  -Remdesivir 1/1-1/6 -Decadron 1/1>> 1/10 -Subcu Lovenox for VTE prophylaxis. -Discontinued airborne and contact precautions.  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  Ammonia 36>14.  Thiamine and  vitamin B12 normal.  COVID-19 and acute hospitalization could contribute.  Alert, oriented x2 today. -Continue low-dose Seroquel at bedtime  -Delirium precautions -Bowel regimen with lactulose as needed -Treating-tertiary syphilis as below.  Possible neurosyphilis: RPR and T. Pallidum positive although low titer.  CSF protein elevated to 100.  Cytology not indicated for specific etiology.  CSF Gram stain and cultures negative so far.  CSF VDRL  negative but this does not exclude per ID. -ID started continuous penicillin G infusion on 1/8 for a total of 14 days.  PICC line placed on 1/9.  Large-cell B-cell non-Hodgkin's lymphoma: Confirmed by supraclavicular lymph node biopsy on 1/6. Splenic mass/retroperitoneal right healer and left supraclavicular adenopathy likely due to lymphoma -Oncology discussed treatment options with family 1/13.  Plan is to wait until he improves from other medical condition and really address this outpatient. -Oncology wanted to maintain PICC line after completion of IV penicillin for potential chemotherapy.  Hypotension: Normotensive. -Continue holding Avapro.  Glaucoma -Continue home eyedrops.  BPH: Not on medication at home. -Monitor urinary output.  Physical deconditioning/debility: -Supportive care.  Goal of care: Patient remains full code per family wish although this would pose more harm than benefit in light of patient's condition.  Palliative care following. -Appreciate palliative input-full code and full scope of care -Plan for PEG tube placement by IR today.    Moderate protein calorie malnutrition Nutrition Problem: Increased nutrient needs Etiology: acute illness(COVID-19)  Signs/Symptoms: estimated needs  Interventions: Tube feeding, Magic cup, Ensure Enlive (each supplement provides 350kcal and 20 grams of protein)   DVT prophylaxis: Subcu Lovenox Code Status: Full code Family Communication: Discussed with patient's son over the phone on 1/14. Disposition Plan: Remains inpatient. Final disposition SNF once PEG tube placed and he tolerates feeding.. Consultants: IR (off), ID (off), oncology, palliative care    Microbiology summarized: [id number] positive. Urine culture with multiple species.  Sch Meds:  Scheduled Meds: . vitamin C  500 mg Oral Daily  . aspirin EC  81 mg Oral Daily  . atorvastatin  5 mg Oral Daily  . brinzolamide  1 drop Both Eyes TID  . Chlorhexidine  Gluconate Cloth  6 each Topical Daily  . Chlorhexidine Gluconate Cloth  6 each Topical Daily  . donepezil  10 mg Oral QHS  . feeding supplement (ENSURE ENLIVE)  237 mL Oral TID BM  . feeding supplement (OSMOLITE 1.5 CAL)  1,000 mL Per Tube Q24H  . latanoprost  1 drop Both Eyes QHS  . multivitamin with minerals  1 tablet Oral Daily  . polyethylene glycol  17 g Oral Daily  . QUEtiapine  25 mg Oral QHS  . senna-docusate  1 tablet Oral BID  . zinc sulfate  220 mg Oral Daily   Continuous Infusions: . dextrose 5 % and 0.9% NaCl 50 mL/hr at 03/09/19 0300  . penicillin g continuous IV infusion 12 Million Units (03/08/19 2206)   PRN Meds:.lactulose **OR** lactulose, sodium chloride flush  Antimicrobials: Anti-infectives (From admission, onward)   Start     Dose/Rate Route Frequency Ordered Stop   03/02/19 1800  penicillin G potassium 12 Million Units in dextrose 5 % 500 mL continuous infusion     12 Million Units 41.7 mL/hr over 12 Hours Intravenous Every 12 hours 03/02/19 1514 03/16/19 1759   02/24/19 1000  remdesivir 100 mg in sodium chloride 0.9 % 100 mL IVPB     100 mg 200 mL/hr over 30 Minutes Intravenous Daily 02/23/19 1519 02/27/19 1120  02/23/19 1600  cefTRIAXone (ROCEPHIN) 1 g in sodium chloride 0.9 % 100 mL IVPB  Status:  Discontinued     1 g 200 mL/hr over 30 Minutes Intravenous Every 24 hours 02/22/19 1751 02/23/19 0818   02/23/19 1600  remdesivir 200 mg in sodium chloride 0.9% 250 mL IVPB     200 mg 580 mL/hr over 30 Minutes Intravenous Once 02/23/19 1519 02/23/19 1624   02/23/19 1500  azithromycin (ZITHROMAX) 500 mg in sodium chloride 0.9 % 250 mL IVPB  Status:  Discontinued     500 mg 250 mL/hr over 60 Minutes Intravenous Every 24 hours 02/22/19 1751 02/23/19 0818   02/22/19 1630  cefTRIAXone (ROCEPHIN) 1 g in sodium chloride 0.9 % 100 mL IVPB     1 g 200 mL/hr over 30 Minutes Intravenous  Once 02/22/19 1615 02/22/19 1729   02/22/19 1630  azithromycin (ZITHROMAX) 500 mg  in sodium chloride 0.9 % 250 mL IVPB     500 mg 250 mL/hr over 60 Minutes Intravenous  Once 02/22/19 1615 02/22/19 1902       I have personally reviewed the following labs and images: CBC: Recent Labs  Lab 03/03/19 0256  WBC 12.2*  HGB 13.4  HCT 41.3  MCV 94.9  PLT PLATELET CLUMPS NOTED ON SMEAR, COUNT APPEARS ADEQUATE   BMP &GFR Recent Labs  Lab 03/05/19 0430 03/06/19 0519 03/07/19 0502 03/08/19 0402 03/09/19 0500  NA 135 133* 133* 131* 130*  K 4.3 4.1 3.9 4.0 4.3  CL 101 99 98 98 96*  CO2 26 28 27 26 25   GLUCOSE 159* 102* 136* 132* 128*  BUN 33* 30* 23 22 22   CREATININE 0.68 0.59* 0.60* 0.51* 0.56*  CALCIUM 8.6* 8.5* 8.3* 8.3* 8.6*  MG 2.5* 2.3 2.5* 2.4 2.4  PHOS 2.4* 2.7 3.1 3.1 3.3   Estimated Creatinine Clearance: 57.6 mL/min (A) (by C-G formula based on SCr of 0.56 mg/dL (L)). Liver & Pancreas: Recent Labs  Lab 03/03/19 0256 03/04/19 0418  AST 42* 39  ALT 38 39  ALKPHOS 69 70  BILITOT 1.0 0.9  PROT 6.0* 5.9*  ALBUMIN 2.7* 2.6*   No results for input(s): LIPASE, AMYLASE in the last 168 hours. Recent Labs  Lab 03/05/19 0400  AMMONIA 14   Diabetic: No results for input(s): HGBA1C in the last 72 hours. Recent Labs  Lab 03/08/19 2114 03/08/19 2347 03/09/19 0609 03/09/19 0841 03/09/19 1234  GLUCAP 110* 136* 107* 128* 99   Cardiac Enzymes: No results for input(s): CKTOTAL, CKMB, CKMBINDEX, TROPONINI in the last 168 hours. No results for input(s): PROBNP in the last 8760 hours. Coagulation Profile: No results for input(s): INR, PROTIME in the last 168 hours. Thyroid Function Tests: No results for input(s): TSH, T4TOTAL, FREET4, T3FREE, THYROIDAB in the last 72 hours. Lipid Profile: No results for input(s): CHOL, HDL, LDLCALC, TRIG, CHOLHDL, LDLDIRECT in the last 72 hours. Anemia Panel: No results for input(s): VITAMINB12, FOLATE, FERRITIN, TIBC, IRON, RETICCTPCT in the last 72 hours. Urine analysis:    Component Value Date/Time    COLORURINE YELLOW 02/22/2019 1226   APPEARANCEUR CLEAR 02/22/2019 1226   LABSPEC 1.019 02/22/2019 1226   PHURINE 7.0 02/22/2019 1226   GLUCOSEU NEGATIVE 02/22/2019 1226   HGBUR NEGATIVE 02/22/2019 1226   BILIRUBINUR NEGATIVE 02/22/2019 1226   BILIRUBINUR negative 02/05/2019 1610   KETONESUR NEGATIVE 02/22/2019 1226   PROTEINUR NEGATIVE 02/22/2019 1226   UROBILINOGEN 0.2 02/05/2019 1610   UROBILINOGEN 0.2 03/21/2007 0944   NITRITE NEGATIVE 02/22/2019 1226  LEUKOCYTESUR NEGATIVE 02/22/2019 1226   Sepsis Labs: Invalid input(s): PROCALCITONIN, [place]  Microbiology: Recent Results (from the past 240 hour(s))  Anaerobic culture     Status: None   Collection Time: 03/01/19  3:17 PM   Specimen: PATH Cytology CSF; Cerebrospinal Fluid  Result Value Ref Range Status   Specimen Description   Final    CSF Performed at [facility], [address] [place], [place] [address]    Special Requests   Final    NONE Performed at [facility], [place] [address], [place], [place] [address]    Culture   Final    NO ANAEROBES ISOLATED Performed at [facility], [place] [address] [place], [place] [address]    Report Status 03/06/2019 FINAL  Final  CSF culture     Status: None   Collection Time: 03/01/19  3:17 PM   Specimen: PATH Cytology CSF; Cerebrospinal Fluid  Result Value Ref Range Status   Specimen Description   Final    CSF Performed at [facility], [place] [address] [place], [place] [address]    Special Requests   Final    NONE Performed at [facility], [place] [address], [place], [place] [address]    Gram Stain   Final    NO WBC SEEN NO ORGANISMS SEEN CYTOSPIN SMEAR Gram Stain Report Called to,Read Back By and Verified With: EDWARDS,J. RN @1713  ON 01.07.2021 BY COHEN,K Performed at [facility], [place] [address] [place], [place] [address]    Culture   Final    NO  GROWTH 3 DAYS Performed at [facility], [place] [address] [place], [place] [address]    Report Status 03/05/2019 FINAL  Final    Radiology Studies: No results found.   Runa Whittingham T. [place]  If 7PM-7AM, please contact night-coverage [email or web address] Password [email or web address] 03/09/2019, 1:57 PM

## 2019-03-10 LAB — GLUCOSE, CAPILLARY
Glucose-Capillary: 106 mg/dL — ABNORMAL HIGH (ref 70–99)
Glucose-Capillary: 113 mg/dL — ABNORMAL HIGH (ref 70–99)
Glucose-Capillary: 83 mg/dL (ref 70–99)
Glucose-Capillary: 84 mg/dL (ref 70–99)
Glucose-Capillary: 93 mg/dL (ref 70–99)
Glucose-Capillary: 94 mg/dL (ref 70–99)

## 2019-03-10 LAB — CBC
HCT: 38.2 % — ABNORMAL LOW (ref 39.0–52.0)
Hemoglobin: 12.9 g/dL — ABNORMAL LOW (ref 13.0–17.0)
MCH: 31.7 pg (ref 26.0–34.0)
MCHC: 33.8 g/dL (ref 30.0–36.0)
MCV: 93.9 fL (ref 80.0–100.0)
Platelets: 230 10*3/uL (ref 150–400)
RBC: 4.07 MIL/uL — ABNORMAL LOW (ref 4.22–5.81)
RDW: 14.3 % (ref 11.5–15.5)
WBC: 20.4 10*3/uL — ABNORMAL HIGH (ref 4.0–10.5)
nRBC: 0 % (ref 0.0–0.2)

## 2019-03-10 LAB — DIFFERENTIAL
Abs Immature Granulocytes: 0.12 10*3/uL — ABNORMAL HIGH (ref 0.00–0.07)
Basophils Absolute: 0 10*3/uL (ref 0.0–0.1)
Basophils Relative: 0 %
Eosinophils Absolute: 0.1 10*3/uL (ref 0.0–0.5)
Eosinophils Relative: 1 %
Immature Granulocytes: 1 %
Lymphocytes Relative: 7 %
Lymphs Abs: 1.4 10*3/uL (ref 0.7–4.0)
Monocytes Absolute: 2.9 10*3/uL — ABNORMAL HIGH (ref 0.1–1.0)
Monocytes Relative: 14 %
Neutro Abs: 15.7 10*3/uL — ABNORMAL HIGH (ref 1.7–7.7)
Neutrophils Relative %: 77 %

## 2019-03-10 LAB — MAGNESIUM: Magnesium: 2.2 mg/dL (ref 1.7–2.4)

## 2019-03-10 LAB — BASIC METABOLIC PANEL
Anion gap: 11 (ref 5–15)
BUN: 23 mg/dL (ref 8–23)
CO2: 22 mmol/L (ref 22–32)
Calcium: 8.5 mg/dL — ABNORMAL LOW (ref 8.9–10.3)
Chloride: 98 mmol/L (ref 98–111)
Creatinine, Ser: 0.64 mg/dL (ref 0.61–1.24)
GFR calc Af Amer: >60 mL/min (ref >60)
GFR calc non Af Amer: >60 mL/min (ref >60)
Glucose, Bld: 103 mg/dL — ABNORMAL HIGH (ref 70–99)
Potassium: 4.2 mmol/L (ref 3.5–5.1)
Sodium: 131 mmol/L — ABNORMAL LOW (ref 135–145)

## 2019-03-10 LAB — D-DIMER, QUANTITATIVE: D-Dimer, Quant: 3.8 ug/mL-FEU — ABNORMAL HIGH (ref 0.00–0.50)

## 2019-03-10 LAB — PHOSPHORUS: Phosphorus: 3.2 mg/dL (ref 2.5–4.6)

## 2019-03-10 MED ORDER — OSMOLITE 1.5 CAL PO LIQD
1000.0000 mL | ORAL | Status: DC
  Administered 2019-03-10 – 2019-03-13 (×4): 1000 mL
  Filled 2019-03-10 (×4): qty 1000

## 2019-03-10 NOTE — Progress Notes (Signed)
IR Brief Note:  Called to RN to follow-up on patient s/p gastrostomy tube placement in IR yesterday evening.  RN reports tube working well.  Flushes without issue.  Site intact- no erythema or bleeding.   Tube ready for use for TF at 24 hrs post placement-- approximately 1730 today.   Brynda Greathouse, MS RD PA-C

## 2019-03-10 NOTE — Progress Notes (Addendum)
PROGRESS NOTE  [name] [id number] DOB: 03/22/1928   PCP: [name], MD  Patient is from: Home  DOA: 02/22/2019 LOS: 5  Brief Narrative / Interim history:  86 y.o. male with a PMH of advanced dementia, hypertension, hyperlipidemia, GERD and BPH who was admitted 02/22/2019 with a chief complaint of generalized weakness progressive over several weeks.  In the ED, he had low-grade temperature but was noted to be hypoxic with a room air saturation of 84% associated with tachycardia.  Chest x-ray was clear and CT was negative for acute infarct.  CT of the abdomen and pelvis showed a splenic mass and retroperitoneal adenopathy along with patchy bilateral airspace disease consistent with viral pneumonia.  COVID-19 testing was positive as was RPR and HIV testing.  T. Pallidum positive. However, RPR titer 1:1. Unclear if this was treated or untreated.  Confirmatory HIV test negative.  Infectious disease, IR and oncology consulted.  He underwent lumbar puncture on 02/28/2018.  Cytology not indicated for specific etiology.  CSF protein elevated to 100.  Gram stain and cultures negative so far.  VDRL pending.  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  Patient had biopsy of supraclavicular lymph nodes on 02/27/2018.  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  Oncology discussed about treatment options.  Plan is to wait until patient improves clinically.   IR to place PEG tube.  Patient could be discharged to SNF once he tolerates feeding via PEG tube.  Subjective: No major events overnight or this morning. He is sleepy but wakes to tactile stimuli. Oriented to self only. Doesn't appear to be in distress. No agitation.   Objective: Vitals:   03/09/19 1715 03/09/19 1815 03/09/19 2022 03/10/19 0524  BP: (!) 154/77 (!) 147/79 133/77 131/73  Pulse: 100 95 (!) 101 81  Resp: (!) 29 17 20 20     Temp:  98.7 F (37.1 C) 98.4 F (36.9 C) 98.5 F (36.9 C)  TempSrc:  Oral Oral Axillary  SpO2: 95% 98% 99% 96%  Weight:      Height:        Intake/Output Summary (Last 24 hours) at 03/10/2019 1014 Last data filed at 03/10/2019 0531 Gross per 24 hour  Intake 647.05 ml  Output 325 ml  Net 322.05 ml   Filed Weights   03/05/19 0500 03/06/19 0415 03/09/19 0610  Weight: 68.5 kg 67.6 kg 71.7 kg    Examination:  GENERAL: Chronically ill-appearing.  No apparent distress. HEENT: MMM.  Vision grossly intact.  Impaired hearing. NECK: Supple.  No apparent JVD.  RESP: On RA.  No IWOB.  Fair aeration bilaterally. CVS:  RRR. Heart sounds normal.  ABD/GI/GU: Bowel sounds present. Soft. Non tender.  MSK/EXT:  Moves extremities. No apparent deformity. No edema.  SKIN: no apparent skin lesion or wound NEURO: Sleepy but wakes to tactile stimuli.  Oriented to self.  Barely follows command.  No apparent focal neuro deficit. PSYCH: Calm.  No signs of agitation.  Procedures:  1/9-PICC line placement 1/15-G-tube placement by IR.  Assessment & Plan: Pneumonia due to COVID-19 infection: CT chest with bibasilar groundglass opacities.  CRP normalized.  D-dimer remains elevated.  No respiratory distress.  Remained stable on room air. Recent Labs    03/08/19 0402 03/09/19 0500 03/10/19 0303  DDIMER 3.24* 3.44* 3.80*  -Remdesivir 1/1-1/6 -Decadron 1/1>> 1/10 -Subcu Lovenox for VTE prophylaxis. -Discontinued airborne and contact precautions as of 03/04/2019 per ID  recommendation given mild illness.  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  Ammonia 36>14.  Thiamine and vitamin B12 normal.  COVID-19 and acute hospitalization could contribute.  Alert, oriented x2 today. -Continue low-dose Seroquel at bedtime  -Delirium precautions -Bowel regimen with lactulose as needed -Treating-tertiary syphilis as below.  Possible neurosyphilis: RPR and  T. Pallidum positive although low titer.  CSF protein elevated to 100.  Cytology not indicated for specific etiology.  CSF Gram stain and cultures negative so far.  CSF VDRL negative but this does not exclude per ID. -ID started continuous penicillin G infusion on 1/8 for a total of 14 days.  PICC line placed on 1/9.  Large-cell B-cell non-Hodgkin's lymphoma: Confirmed by supraclavicular lymph node biopsy on 1/6. Splenic mass/retroperitoneal right healer and left supraclavicular adenopathy likely due to lymphoma -Oncology discussed treatment options with family 1/13.  Plan is to wait until he improves from other medical condition and really address this outpatient. -Oncology wanted to maintain PICC line after completion of IV penicillin for potential chemotherapy.  Hypotension: Normotensive. -Continue holding Avapro.  Glaucoma -Continue home eyedrops.  BPH: Not on medication at home. -Monitor urinary output.  Leukocytosis/bandemia: No obvious source of infection on exam.  No fever.  Not on steroids. -Recheck in the morning -Infectious work-up  If febrile or persistent leukocytosis.  Physical deconditioning/debility: -Supportive care.  Goal of care: Patient remains full code per family wish although this would pose more harm than benefit in light of patient's condition.  Palliative care following. -Appreciate palliative input-full code and full scope of care.  G-tube placed 1/15.    Moderate protein calorie malnutrition -Initiate feeding per G-tube today. Nutrition Problem: Increased nutrient needs Etiology: acute illness(COVID-19)  Signs/Symptoms: estimated needs  Interventions: Tube feeding, Magic cup, Ensure Enlive (each supplement provides 350kcal and 20 grams of protein)   DVT prophylaxis: Subcu Lovenox Code Status: Full code Family Communication: Attempted to call patient's son for update but no answer. Disposition Plan: Remains inpatient pending safe disposition/SNF.   TOC on board. Consultants: IR (off), ID (off), oncology, palliative care    Microbiology summarized: [id number] positive. Urine culture with multiple species.  Sch Meds:  Scheduled Meds: . vitamin C  500 mg Oral Daily  . aspirin EC  81 mg Oral Daily  . atorvastatin  5 mg Oral Daily  . brinzolamide  1 drop Both Eyes TID  . Chlorhexidine Gluconate Cloth  6 each Topical Daily  . Chlorhexidine Gluconate Cloth  6 each Topical Daily  . donepezil  10 mg Oral QHS  . enoxaparin (LOVENOX) injection  40 mg Subcutaneous Daily  . feeding supplement (ENSURE ENLIVE)  237 mL Oral TID BM  . feeding supplement (OSMOLITE 1.5 CAL)  1,000 mL Per Tube Q24H  . latanoprost  1 drop Both Eyes QHS  . multivitamin with minerals  1 tablet Oral Daily  . polyethylene glycol  17 g Oral Daily  . QUEtiapine  25 mg Oral QHS  . senna-docusate  1 tablet Oral BID  . zinc sulfate  220 mg Oral Daily   Continuous Infusions: . penicillin g continuous IV infusion 12 Million Units (03/10/19 0531)   PRN Meds:.lactulose **OR** lactulose, sodium chloride flush  Antimicrobials: Anti-infectives (From admission, onward)   Start     Dose/Rate Route Frequency Ordered Stop   03/02/19 1800  penicillin G potassium 12 Million Units in dextrose 5 % 500 mL continuous infusion     12 Million Units 41.7 mL/hr  over 12 Hours Intravenous Every 12 hours 03/02/19 1514 03/16/19 1759   02/24/19 1000  remdesivir 100 mg in sodium chloride 0.9 % 100 mL IVPB     100 mg 200 mL/hr over 30 Minutes Intravenous Daily 02/23/19 1519 02/27/19 1120   02/23/19 1600  cefTRIAXone (ROCEPHIN) 1 g in sodium chloride 0.9 % 100 mL IVPB  Status:  Discontinued     1 g 200 mL/hr over 30 Minutes Intravenous Every 24 hours 02/22/19 1751 02/23/19 0818   02/23/19 1600  remdesivir 200 mg in sodium chloride 0.9% 250 mL IVPB     200 mg 580 mL/hr over 30 Minutes Intravenous Once 02/23/19 1519 02/23/19 1624   02/23/19 1500  azithromycin (ZITHROMAX) 500 mg in sodium  chloride 0.9 % 250 mL IVPB  Status:  Discontinued     500 mg 250 mL/hr over 60 Minutes Intravenous Every 24 hours 02/22/19 1751 02/23/19 0818   02/22/19 1630  cefTRIAXone (ROCEPHIN) 1 g in sodium chloride 0.9 % 100 mL IVPB     1 g 200 mL/hr over 30 Minutes Intravenous  Once 02/22/19 1615 02/22/19 1729   02/22/19 1630  azithromycin (ZITHROMAX) 500 mg in sodium chloride 0.9 % 250 mL IVPB     500 mg 250 mL/hr over 60 Minutes Intravenous  Once 02/22/19 1615 02/22/19 1902       I have personally reviewed the following labs and images: CBC: Recent Labs  Lab 03/10/19 0303  WBC 20.4*  NEUTROABS 15.7*  HGB 12.9*  HCT 38.2*  MCV 93.9  PLT 230   BMP &GFR Recent Labs  Lab 03/06/19 0519 03/07/19 0502 03/08/19 0402 03/09/19 0500 03/10/19 0303  NA 133* 133* 131* 130* 131*  K 4.1 3.9 4.0 4.3 4.2  CL 99 98 98 96* 98  CO2 28 27 26 25 22   GLUCOSE 102* 136* 132* 128* 103*  BUN 30* 23 22 22 23   CREATININE 0.59* 0.60* 0.51* 0.56* 0.64  CALCIUM 8.5* 8.3* 8.3* 8.6* 8.5*  MG 2.3 2.5* 2.4 2.4 2.2  PHOS 2.7 3.1 3.1 3.3 3.2   Estimated Creatinine Clearance: 57.6 mL/min (by C-G formula based on SCr of 0.64 mg/dL). Liver & Pancreas: Recent Labs  Lab 03/04/19 0418  AST 39  ALT 39  ALKPHOS 70  BILITOT 0.9  PROT 5.9*  ALBUMIN 2.6*   No results for input(s): LIPASE, AMYLASE in the last 168 hours. Recent Labs  Lab 03/05/19 0400  AMMONIA 14   Diabetic: No results for input(s): HGBA1C in the last 72 hours. Recent Labs  Lab 03/09/19 1620 03/09/19 2015 03/09/19 2355 03/10/19 0521 03/10/19 0804  GLUCAP 97 88 84 93 113*   Cardiac Enzymes: No results for input(s): CKTOTAL, CKMB, CKMBINDEX, TROPONINI in the last 168 hours. No results for input(s): PROBNP in the last 8760 hours. Coagulation Profile: No results for input(s): INR, PROTIME in the last 168 hours. Thyroid Function Tests: No results for input(s): TSH, T4TOTAL, FREET4, T3FREE, THYROIDAB in the last 72 hours. Lipid  Profile: No results for input(s): CHOL, HDL, LDLCALC, TRIG, CHOLHDL, LDLDIRECT in the last 72 hours. Anemia Panel: No results for input(s): VITAMINB12, FOLATE, FERRITIN, TIBC, IRON, RETICCTPCT in the last 72 hours. Urine analysis:    Component Value Date/Time   COLORURINE YELLOW 02/22/2019 1226   APPEARANCEUR CLEAR 02/22/2019 1226   LABSPEC 1.019 02/22/2019 1226   PHURINE 7.0 02/22/2019 1226   GLUCOSEU NEGATIVE 02/22/2019 1226   HGBUR NEGATIVE 02/22/2019 1226   [place] 02/22/2019 1226   BILIRUBINUR negative  02/05/2019 1610   [place] 02/22/2019 1226   PROTEINUR NEGATIVE 02/22/2019 1226   UROBILINOGEN 0.2 02/05/2019 1610   UROBILINOGEN 0.2 03/21/2007 0944   NITRITE NEGATIVE 02/22/2019 [place] 02/22/2019 1226   Sepsis Labs: Invalid input(s): PROCALCITONIN, [place]  Microbiology: Recent Results (from the past 240 hour(s))  Anaerobic culture     Status: None   Collection Time: 03/01/19  3:17 PM   Specimen: PATH Cytology CSF; Cerebrospinal Fluid  Result Value Ref Range Status   Specimen Description   Final    CSF Performed at [facility], [address], [place], [place] [address]    Special Requests   Final    NONE Performed at [facility], [place] [address], [place], [place] [address]    Culture   Final    NO ANAEROBES ISOLATED Performed at [facility], [place] [address], [place], [place] [address]    Report Status 03/06/2019 FINAL  Final  CSF culture     Status: None   Collection Time: 03/01/19  3:17 PM   Specimen: PATH Cytology CSF; Cerebrospinal Fluid  Result Value Ref Range Status   Specimen Description   Final    CSF Performed at [facility], [place] [address] [place], [place] [address]    Special Requests   Final    NONE Performed at [facility], [place] [address] [place], [place] [address]    Gram Stain   Final    NO WBC  SEEN NO ORGANISMS SEEN CYTOSPIN SMEAR Gram Stain Report Called to,Read Back By and Verified With: EDWARDS,J. RN @1713  ON 01.07.2021 BY COHEN,K Performed at [facility], [place] [address], [place], [place] [address]    Culture   Final    NO GROWTH 3 DAYS Performed at [facility], [place] [address], [place],  [address]    Report Status 03/05/2019 FINAL  Final    Radiology Studies: IR GASTROSTOMY TUBE MOD SED  Result Date: 03/10/2019 INDICATION: COVID positive, dysphagia EXAM: FLUOROSCOPIC 20 FRENCH PULL-THROUGH GASTROSTOMY Date:  03/09/2019 03/09/2019 5:48 pm Radiologist:  Jerilynn Mages. Daryll Brod, MD Guidance:  Ultrasound and fluoroscopic MEDICATIONS: Ancef 2 g; Antibiotics were administered within 1 hour of the procedure. Glucagon 1 mg IV ANESTHESIA/SEDATION: Versed 1.0 mg IV; Fentanyl 0 mcg IV Moderate Sedation Time:  10 minutes The patient was continuously monitored during the procedure by the interventional radiology nurse under my direct supervision. CONTRAST:  20 cc-administered into the gastric lumen. FLUOROSCOPY TIME:  Fluoroscopy Time: 2 minutes 6 seconds (12 mGy). COMPLICATIONS: None immediate. PROCEDURE: Informed consent was obtained from the patient following explanation of the procedure, risks, benefits and alternatives. The patient understands, agrees and consents for the procedure. All questions were addressed. A time out was performed. Maximal barrier sterile technique utilized including caps, mask, sterile gowns, sterile gloves, large sterile drape, hand hygiene, and betadine prep. The left upper quadrant was sterilely prepped and draped. An oral gastric catheter was inserted into the stomach under fluoroscopy. The existing nasogastric feeding tube was removed. Air was injected into the stomach for insufflation and visualization under fluoroscopy. The air distended stomach was confirmed beneath the anterior abdominal wall in the frontal and lateral projections. Under  sterile conditions and local anesthesia, a 42 gauge trocar needle was utilized to access the stomach percutaneously beneath the left subcostal margin. Needle position was confirmed within the stomach under biplane fluoroscopy. Contrast injection confirmed position also.  A single T tack was deployed for gastropexy. Over an Amplatz guide wire, a 9-French sheath was inserted into the stomach. A snare device was utilized to capture the oral gastric catheter. The snare device was pulled retrograde from the stomach up the esophagus and out the oropharynx. The 20-French pull-through gastrostomy was connected to the snare device and pulled antegrade through the oropharynx down the esophagus into the stomach and then through the percutaneous tract external to the patient. The gastrostomy was assembled externally. Contrast injection confirms position in the stomach. Images were obtained for documentation. The patient tolerated procedure well. No immediate complication. IMPRESSION: Fluoroscopic insertion of a 20-French "pull-through" gastrostomy. Electronically Signed   By: Jerilynn Mages.  Shick M.D.   On: 03/10/2019 09:19     Shalita Notte T. [place]  If 7PM-7AM, please contact night-coverage [email or web address] Password [email or web address] 03/10/2019, 10:14 AM

## 2019-03-11 LAB — BASIC METABOLIC PANEL
Anion gap: 7 (ref 5–15)
BUN: 20 mg/dL (ref 8–23)
CO2: 26 mmol/L (ref 22–32)
Calcium: 8.2 mg/dL — ABNORMAL LOW (ref 8.9–10.3)
Chloride: 95 mmol/L — ABNORMAL LOW (ref 98–111)
Creatinine, Ser: 0.38 mg/dL — ABNORMAL LOW (ref 0.61–1.24)
GFR calc Af Amer: >60 mL/min (ref >60)
GFR calc non Af Amer: >60 mL/min (ref >60)
Glucose, Bld: 110 mg/dL — ABNORMAL HIGH (ref 70–99)
Potassium: 3.9 mmol/L (ref 3.5–5.1)
Sodium: 128 mmol/L — ABNORMAL LOW (ref 135–145)

## 2019-03-11 LAB — GLUCOSE, CAPILLARY
Glucose-Capillary: 102 mg/dL — ABNORMAL HIGH (ref 70–99)
Glucose-Capillary: 111 mg/dL — ABNORMAL HIGH (ref 70–99)
Glucose-Capillary: 117 mg/dL — ABNORMAL HIGH (ref 70–99)
Glucose-Capillary: 123 mg/dL — ABNORMAL HIGH (ref 70–99)
Glucose-Capillary: 145 mg/dL — ABNORMAL HIGH (ref 70–99)
Glucose-Capillary: 99 mg/dL (ref 70–99)

## 2019-03-11 LAB — CBC
HCT: 35.5 % — ABNORMAL LOW (ref 39.0–52.0)
Hemoglobin: 11.8 g/dL — ABNORMAL LOW (ref 13.0–17.0)
MCH: 31.1 pg (ref 26.0–34.0)
MCHC: 33.2 g/dL (ref 30.0–36.0)
MCV: 93.7 fL (ref 80.0–100.0)
Platelets: 219 10*3/uL (ref 150–400)
RBC: 3.79 MIL/uL — ABNORMAL LOW (ref 4.22–5.81)
RDW: 13.9 % (ref 11.5–15.5)
WBC: 11.7 10*3/uL — ABNORMAL HIGH (ref 4.0–10.5)
nRBC: 0 % (ref 0.0–0.2)

## 2019-03-11 LAB — MAGNESIUM: Magnesium: 2.1 mg/dL (ref 1.7–2.4)

## 2019-03-11 LAB — PHOSPHORUS: Phosphorus: 2.6 mg/dL (ref 2.5–4.6)

## 2019-03-11 LAB — D-DIMER, QUANTITATIVE: D-Dimer, Quant: 2.9 ug/mL-FEU — ABNORMAL HIGH (ref 0.00–0.50)

## 2019-03-11 NOTE — Progress Notes (Signed)
PROGRESS NOTE  [name] [id number] DOB: 16-Apr-1928   PCP: [name], MD  Patient is from: Home  DOA: 02/22/2019 LOS: 28  Brief Narrative / Interim history:  86 y.o. male with a PMH of advanced dementia, hypertension, hyperlipidemia, GERD and BPH who was admitted 02/22/2019 with a chief complaint of generalized weakness progressive over several weeks.  In the ED, he had low-grade temperature but was noted to be hypoxic with a room air saturation of 84% associated with tachycardia.  Chest x-ray was clear and CT was negative for acute infarct.  CT of the abdomen and pelvis showed a splenic mass and retroperitoneal adenopathy along with patchy bilateral airspace disease consistent with viral pneumonia.  COVID-19 testing was positive as was RPR and HIV testing.  T. Pallidum positive. However, RPR titer 1:1. Unclear if this was treated or untreated.  Confirmatory HIV test negative.  Infectious disease, IR and oncology consulted.  He underwent lumbar puncture on 02/28/2018.  Cytology not indicated for specific etiology.  CSF protein elevated to 100.  Gram stain and cultures negative so far.  VDRL pending.  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  Patient had biopsy of supraclavicular lymph nodes on 02/27/2018.  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  Oncology discussed about treatment options.  Plan is to wait until patient improves clinically.   IR to place PEG tube.  Patient to be discharged to SNF once he tolerates feeding via PEG tube.  Subjective: PEG tube placed yesterday.  No issues with tube feeds.  He is sleepy but awakens on verbal stimuli.  Oriented to self only.  Denies any complaints.  Objective: Vitals:   03/10/19 1240 03/10/19 1838 03/10/19 2052 03/11/19 0639  BP: 113/76 139/79 120/75 139/76  Pulse: 99 97 97 78  Resp: 20 20 16 16   Temp: 98.6 F (37 C) 98.1 F  (36.7 C) 98.1 F (36.7 C) 97.7 F (36.5 C)  TempSrc: Axillary Oral Oral Oral  SpO2: 92% 93% 97% 94%  Weight:      Height:        Intake/Output Summary (Last 24 hours) at 03/11/2019 0925 Last data filed at 03/11/2019 0034 Gross per 24 hour  Intake 862.39 ml  Output 700 ml  Net 162.39 ml   Filed Weights   03/05/19 0500 03/06/19 0415 03/09/19 0610  Weight: 68.5 kg 67.6 kg 71.7 kg    Examination:  GENERAL: Chronically ill-appearing.  No apparent distress. HEENT: MMM.  Vision grossly intact.  Impaired hearing. NECK: Supple.  No apparent JVD.  RESP: On RA.  No IWOB.  Fair aeration bilaterally. CVS:  RRR. Heart sounds normal.  ABD/GI/GU: Bowel sounds present. Soft. Non tender.  MSK/EXT:  Moves extremities. No apparent deformity. No edema.  SKIN: no apparent skin lesion or wound NEURO: Sleepy but wakes to verbal stimuli.  Oriented to self.  Barely follows command.  No apparent focal neuro deficit. PSYCH: Calm.  No signs of agitation.  Procedures:  1/9-PICC line placement 1/15-G-tube placement by IR.  Assessment & Plan: Pneumonia due to COVID-19 infection: CT chest with bibasilar groundglass opacities.  CRP normalized.  D-dimer remains elevated.  No respiratory distress.  Remained stable on room air. Recent Labs    03/09/19 0500 03/10/19 0303 03/11/19 0500  DDIMER 3.44* 3.80* 2.90*  Completed 5 days of remdesivir, Decadron -Subcu Lovenox for VTE prophylaxis. 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  Ammonia 36>14.  Thiamine and vitamin B12 normal.  COVID-19 and acute hospitalization could contribute.  Alert, oriented x2 today. -Continue low-dose Seroquel at bedtime  -Delirium precautions -Bowel regimen with lactulose as needed -Treating-tertiary syphilis as below  Possible neurosyphilis: RPR and T. Pallidum  positive although low titer.  CSF protein elevated to 100.  Cytology not indicated for specific etiology.  CSF Gram stain and cultures negative so far.  CSF VDRL negative but this does not exclude per ID. -ID started continuous penicillin G infusion for a total of 14 days (end date 03/16/2019).  PICC line placed on 1/9.  Large-cell B-cell non-Hodgkin's lymphoma: Confirmed by supraclavicular lymph node biopsy on 1/6. Splenic mass/retroperitoneal right healer and left supraclavicular adenopathy likely due to lymphoma -Oncology discussed treatment options with family 1/13.  Plan is to wait until he improves from other medical condition and really address this outpatient. -Oncology wanted to maintain PICC line after completion of IV penicillin for potential chemotherapy.  Hypotension: Normotensive. -Continue holding Avapro.  Glaucoma -Continue home eyedrops.  BPH: Not on medication at home. -Monitor urinary output.  Leukocytosis/bandemia: No obvious source of infection on exam.  No fever.  Not on steroids. -Improving.  Afebrile.  Will hold off on further infectious work-up at this time.  Physical deconditioning/debility: -Supportive care.  Goal of care: Patient remains full code per family wish although this would pose more harm than benefit in light of patient's condition.  Palliative care following. -  G-tube placed 1/15.    Moderate protein calorie malnutrition Tolerating G-tube feeds Nutrition Problem: Increased nutrient needs Etiology: acute illness(COVID-19)  Signs/Symptoms: estimated needs  Interventions: Tube feeding, Magic cup, Ensure Enlive (each supplement provides 350kcal and 20 grams of protein)   DVT prophylaxis: Subcu Lovenox Code Status: Full code Family Communication: Disposition Plan: Remains inpatient pending safe disposition/SNF.  TOC on board. Consultants: IR (off), ID (off), oncology, palliative care    Microbiology summarized: [id number] positive. Urine  culture with multiple species.  Sch Meds:  Scheduled Meds: . vitamin C  500 mg Oral Daily  . aspirin EC  81 mg Oral Daily  . atorvastatin  5 mg Oral Daily  . brinzolamide  1 drop Both Eyes TID  . Chlorhexidine Gluconate Cloth  6 each Topical Daily  . Chlorhexidine Gluconate Cloth  6 each Topical Daily  . donepezil  10 mg Oral QHS  . enoxaparin (LOVENOX) injection  40 mg Subcutaneous Daily  . feeding supplement (ENSURE ENLIVE)  237 mL Oral TID BM  . feeding supplement (OSMOLITE 1.5 CAL)  1,000 mL Per Tube Q24H  . latanoprost  1 drop Both Eyes QHS  . multivitamin with minerals  1 tablet Oral Daily  . polyethylene glycol  17 g Oral Daily  . QUEtiapine  25 mg Oral QHS  . senna-docusate  1 tablet Oral BID  . zinc sulfate  220 mg Oral Daily   Continuous Infusions: . penicillin g continuous IV infusion 12 Million Units (03/11/19 0646)   PRN Meds:.lactulose **OR** lactulose, sodium chloride flush  Antimicrobials: Anti-infectives (From admission, onward)   Start     Dose/Rate Route Frequency Ordered Stop   03/02/19 1800  penicillin G potassium 12 Million Units in dextrose 5 % 500 mL continuous infusion     12 Million Units 41.7 mL/hr over 12 Hours Intravenous Every 12 hours 03/02/19 1514 03/16/19 1759   02/24/19 1000  remdesivir 100  mg in sodium chloride 0.9 % 100 mL IVPB     100 mg 200 mL/hr over 30 Minutes Intravenous Daily 02/23/19 1519 02/27/19 1120   02/23/19 1600  cefTRIAXone (ROCEPHIN) 1 g in sodium chloride 0.9 % 100 mL IVPB  Status:  Discontinued     1 g 200 mL/hr over 30 Minutes Intravenous Every 24 hours 02/22/19 1751 02/23/19 0818   02/23/19 1600  remdesivir 200 mg in sodium chloride 0.9% 250 mL IVPB     200 mg 580 mL/hr over 30 Minutes Intravenous Once 02/23/19 1519 02/23/19 1624   02/23/19 1500  azithromycin (ZITHROMAX) 500 mg in sodium chloride 0.9 % 250 mL IVPB  Status:  Discontinued     500 mg 250 mL/hr over 60 Minutes Intravenous Every 24 hours 02/22/19 1751  02/23/19 0818   02/22/19 1630  cefTRIAXone (ROCEPHIN) 1 g in sodium chloride 0.9 % 100 mL IVPB     1 g 200 mL/hr over 30 Minutes Intravenous  Once 02/22/19 1615 02/22/19 1729   02/22/19 1630  azithromycin (ZITHROMAX) 500 mg in sodium chloride 0.9 % 250 mL IVPB     500 mg 250 mL/hr over 60 Minutes Intravenous  Once 02/22/19 1615 02/22/19 1902       I have personally reviewed the following labs and images: CBC: Recent Labs  Lab 03/10/19 0303 03/11/19 0500  WBC 20.4* 11.7*  NEUTROABS 15.7*  --   HGB 12.9* 11.8*  HCT 38.2* 35.5*  MCV 93.9 93.7  PLT 230 219   BMP &GFR Recent Labs  Lab 03/07/19 0502 03/08/19 0402 03/09/19 0500 03/10/19 0303 03/11/19 0500  NA 133* 131* 130* 131* 128*  K 3.9 4.0 4.3 4.2 3.9  CL 98 98 96* 98 95*  CO2 27 26 25 22 26   GLUCOSE 136* 132* 128* 103* 110*  BUN 23 22 22 23 20   CREATININE 0.60* 0.51* 0.56* 0.64 0.38*  CALCIUM 8.3* 8.3* 8.6* 8.5* 8.2*  MG 2.5* 2.4 2.4 2.2 2.1  PHOS 3.1 3.1 3.3 3.2 2.6   Estimated Creatinine Clearance: 57.6 mL/min (A) (by C-G formula based on SCr of 0.38 mg/dL (L)). Liver & Pancreas: No results for input(s): AST, ALT, ALKPHOS, BILITOT, PROT, ALBUMIN in the last 168 hours. No results for input(s): LIPASE, AMYLASE in the last 168 hours. Recent Labs  Lab 03/05/19 0400  AMMONIA 14   Diabetic: No results for input(s): HGBA1C in the last 72 hours. Recent Labs  Lab 03/10/19 1616 03/10/19 2138 03/11/19 0028 03/11/19 0408 03/11/19 0821  GLUCAP 83 106* 145* 99 123*   Cardiac Enzymes: No results for input(s): CKTOTAL, CKMB, CKMBINDEX, TROPONINI in the last 168 hours. No results for input(s): PROBNP in the last 8760 hours. Coagulation Profile: No results for input(s): INR, PROTIME in the last 168 hours. Thyroid Function Tests: No results for input(s): TSH, T4TOTAL, FREET4, T3FREE, THYROIDAB in the last 72 hours. Lipid Profile: No results for input(s): CHOL, HDL, LDLCALC, TRIG, CHOLHDL, LDLDIRECT in the last 72  hours. Anemia Panel: No results for input(s): VITAMINB12, FOLATE, FERRITIN, TIBC, IRON, RETICCTPCT in the last 72 hours. Urine analysis:    Component Value Date/Time   COLORURINE YELLOW 02/22/2019 1226   APPEARANCEUR CLEAR 02/22/2019 1226   LABSPEC 1.019 02/22/2019 1226   PHURINE 7.0 02/22/2019 1226   GLUCOSEU NEGATIVE 02/22/2019 1226   HGBUR NEGATIVE 02/22/2019 1226   BILIRUBINUR NEGATIVE 02/22/2019 1226   BILIRUBINUR negative 02/05/2019 [place] 02/22/2019 [place] 02/22/2019 1226   UROBILINOGEN  0.2 02/05/2019 1610   UROBILINOGEN 0.2 03/21/2007 0944   NITRITE NEGATIVE 02/22/2019 1226   LEUKOCYTESUR NEGATIVE 02/22/2019 1226   Sepsis Labs: Invalid input(s): PROCALCITONIN, [place]  Microbiology: Recent Results (from the past 240 hour(s))  Anaerobic culture     Status: None   Collection Time: 03/01/19  3:17 PM   Specimen: PATH Cytology CSF; Cerebrospinal Fluid  Result Value Ref Range Status   Specimen Description   Final    CSF Performed at [facility], [address], [place], [place] [address]    Special Requests   Final    NONE Performed at [facility], [place] [address], [place], [place] [address]    Culture   Final    NO ANAEROBES ISOLATED Performed at [facility], [place] [address] [place], [place] [address]    Report Status 03/06/2019 FINAL  Final  CSF culture     Status: None   Collection Time: 03/01/19  3:17 PM   Specimen: PATH Cytology CSF; Cerebrospinal Fluid  Result Value Ref Range Status   Specimen Description   Final    CSF Performed at [facility], [place] [address], [place], [place] [address]    Special Requests   Final    NONE Performed at [facility], [place] [address] [place], [place] [address]    Gram Stain   Final    NO WBC SEEN NO ORGANISMS SEEN CYTOSPIN SMEAR Gram Stain Report Called to,Read Back By and Verified  With: EDWARDS,J. RN @1713  ON 01.07.2021 BY COHEN,K Performed at [facility], [place] [address] [place], [place] [address]    Culture   Final    NO GROWTH 3 DAYS Performed at [facility], [place] [address], [place], [place] [address]    Report Status 03/05/2019 FINAL  Final    Radiology Studies: No results found.  Budd Palmer, MD Internal Medicine  Hospitalist    If 7PM-7AM, please contact night-coverage [email or web address] Password [facility] 03/11/2019, 9:25 AM

## 2019-03-12 ENCOUNTER — Encounter (HOSPITAL_COMMUNITY): Payer: Self-pay | Admitting: Oncology

## 2019-03-12 ENCOUNTER — Encounter: Payer: Self-pay | Admitting: Family Medicine

## 2019-03-12 DIAGNOSIS — C8330 Diffuse large B-cell lymphoma, unspecified site: Secondary | ICD-10-CM

## 2019-03-12 LAB — SARS CORONAVIRUS 2 (TAT 6-24 HRS): SARS Coronavirus 2: NEGATIVE

## 2019-03-12 LAB — CBC
HCT: 35.8 % — ABNORMAL LOW (ref 39.0–52.0)
Hemoglobin: 12 g/dL — ABNORMAL LOW (ref 13.0–17.0)
MCH: 31.3 pg (ref 26.0–34.0)
MCHC: 33.5 g/dL (ref 30.0–36.0)
MCV: 93.5 fL (ref 80.0–100.0)
Platelets: 228 10*3/uL (ref 150–400)
RBC: 3.83 MIL/uL — ABNORMAL LOW (ref 4.22–5.81)
RDW: 14 % (ref 11.5–15.5)
WBC: 12.1 10*3/uL — ABNORMAL HIGH (ref 4.0–10.5)
nRBC: 0 % (ref 0.0–0.2)

## 2019-03-12 LAB — MAGNESIUM: Magnesium: 2 mg/dL (ref 1.7–2.4)

## 2019-03-12 LAB — BASIC METABOLIC PANEL
Anion gap: 8 (ref 5–15)
BUN: 17 mg/dL (ref 8–23)
CO2: 24 mmol/L (ref 22–32)
Calcium: 8.3 mg/dL — ABNORMAL LOW (ref 8.9–10.3)
Chloride: 94 mmol/L — ABNORMAL LOW (ref 98–111)
Creatinine, Ser: 0.44 mg/dL — ABNORMAL LOW (ref 0.61–1.24)
GFR calc Af Amer: >60 mL/min (ref >60)
GFR calc non Af Amer: >60 mL/min (ref >60)
Glucose, Bld: 121 mg/dL — ABNORMAL HIGH (ref 70–99)
Potassium: 4 mmol/L (ref 3.5–5.1)
Sodium: 126 mmol/L — ABNORMAL LOW (ref 135–145)

## 2019-03-12 LAB — GLUCOSE, CAPILLARY
Glucose-Capillary: 104 mg/dL — ABNORMAL HIGH (ref 70–99)
Glucose-Capillary: 112 mg/dL — ABNORMAL HIGH (ref 70–99)
Glucose-Capillary: 117 mg/dL — ABNORMAL HIGH (ref 70–99)
Glucose-Capillary: 118 mg/dL — ABNORMAL HIGH (ref 70–99)
Glucose-Capillary: 123 mg/dL — ABNORMAL HIGH (ref 70–99)

## 2019-03-12 LAB — OSMOLALITY: Osmolality: 264 mOsm/kg — ABNORMAL LOW (ref 275–295)

## 2019-03-12 LAB — PHOSPHORUS: Phosphorus: 2.6 mg/dL (ref 2.5–4.6)

## 2019-03-12 LAB — D-DIMER, QUANTITATIVE: D-Dimer, Quant: 3.32 ug/mL-FEU — ABNORMAL HIGH (ref 0.00–0.50)

## 2019-03-12 MED ORDER — SODIUM CHLORIDE 0.9 % IV SOLN: INTRAVENOUS | Status: AC

## 2019-03-12 MED ORDER — SODIUM CHLORIDE 0.9 % IV SOLN
INTRAVENOUS | Status: DC
  Administered 2019-03-12: 20:00:00 10 mL via INTRAVENOUS

## 2019-03-12 NOTE — TOC Progression Note (Addendum)
Transition of Care [facility]) - Progression Note    Patient Details  Name: [name] MRN: [id number] Date of Birth: 1928/08/06  Transition of Care [facility]) CM/SW [place], LCSW Phone Number: 03/12/2019, 12:25 PM  Clinical Narrative:    [facility] confirmed bed availability 1/19.  CSW reached out to the patient son to provide an update.    Update: [facility] retracted there bed offer. The facility is concern the patient will not do well in rehab and could potentially be long term care. Patient son called CSW with concerns.  CSW reached out to the physician to explain the discharge barrier.  PT ordered again to determine patient rehab ability at this time.     Barriers to Discharge: Continued Medical Work up, Other (comment)  Expected Discharge Plan and Services                                                 Social Determinants of Health (SDOH) Interventions    Readmission Risk Interventions No flowsheet data found.

## 2019-03-12 NOTE — Evaluation (Addendum)
Physical Therapy Evaluation Patient Details Name: [name] MRN: [id number] DOB: 12/20/1928 Today's Date: 03/12/2019   History of Present Illness  Pt admitted from home Covid + and with hx of dementia.  Pt now additionally being treated for non-Hodgkin's lymphoma, encephalopathy, and possible neurosyphilis.  Pt now with NG feeding tube and per notes to get PEG tube tomorrow.  Clinical Impression  Pt continues to require heavy +2 assist for all mobility tasks. Ability to follow commands seems to be affected by hearing deficits (no hearing aids in ears today) and dementia. He also remains very weak. Continue to recommend SNF for rehab trial to see if pt is able to regain any functional mobility. However, there is a possibility that he may need LTC at [facility]. He is currently not at a level where his wife can manage his care at home. Will continue to follow during hospital stay.     Follow Up Recommendations SNF    Equipment Recommendations  None recommended by PT    Recommendations for Other Services       Precautions / Restrictions Precautions Precautions: Fall Precaution Comments: NG feeding tube Restrictions Weight Bearing Restrictions: No      Mobility  Bed Mobility Overal bed mobility: Needs Assistance Bed Mobility: Rolling;Supine to Sit;Sit to Supine Rolling: Mod assist; +2 for physical assistance; +2 for safety/equipment   Supine to sit: Total assist;+2 for physical assistance;+2 for safety/equipment Sit to supine: Total assist;+2 for physical assistance;+2 for safety/equipment   General bed mobility comments: Assist for LEs and trunk. Utilized bedpad to aid with scooting, positioning. Increased time. Multimodal cueing required. Heavy posterior lean, initially while sitting EOB.  Transfers Overall transfer level: Needs assistance Equipment used: Rolling walker (2 wheeled) Transfers: Sit to/from Stand Sit to Stand: Total assist;+2 physical assistance;+2  safety/equipment         General transfer comment: Attempted sit to stand x 3 (2x with RW, 1x with just therapist and tech for support). Pt unable to stand on today.  Ambulation/Gait                Stairs            Wheelchair Mobility    Modified Rankin (Stroke Patients Only)       Balance Overall balance assessment: Needs assistance Sitting-balance support: Bilateral upper extremity supported;Feet supported Sitting balance-Leahy Scale: Poor Sitting balance - Comments: Heavy posterior lean. Postural control: Posterior lean                                   Pertinent Vitals/Pain Pain Assessment: Faces Faces Pain Scale: No hurt    Home Living                        Prior Function                 Hand Dominance        Extremity/Trunk Assessment                Communication      Cognition Arousal/Alertness: Awake/alert Behavior During Therapy: WFL for tasks assessed/performed Overall Cognitive Status: No family/caregiver present to determine baseline cognitive functioning                                 General Comments: Limited due to [facility] and dementia.  Pt following ~50% of multi modal commands (no hearing aids in)      General Comments      Exercises     Assessment/Plan    PT Assessment Patient needs continued PT services  PT Problem List Decreased strength;Decreased mobility;Decreased range of motion;Decreased activity tolerance;Decreased balance;Decreased knowledge of use of DME;Decreased cognition;Decreased safety awareness       PT Treatment Interventions DME instruction;Gait training;Stair training;Functional mobility training;Therapeutic activities;Therapeutic exercise;Patient/family education;Balance training    PT Goals (Current goals can be found in the Care Plan section)  Acute Rehab PT Goals Patient Stated Goal: none stated PT Goal Formulation: Patient unable to participate in  goal setting Time For Goal Achievement: 03/26/19 Potential to Achieve Goals: Fair    Frequency Min 2X/week   Barriers to discharge        Co-evaluation               AM-PAC PT "6 Clicks" Mobility  Outcome Measure Help needed turning from your back to your side while in a flat bed without using bedrails?: A Lot Help needed moving from lying on your back to sitting on the side of a flat bed without using bedrails?: Total Help needed moving to and from a bed to a chair (including a wheelchair)?: Total Help needed standing up from a chair using your arms (e.g., wheelchair or bedside chair)?: Total Help needed to walk in hospital room?: Total Help needed climbing 3-5 steps with a railing? : Total 6 Click Score: 7    End of Session   Activity Tolerance: Patient tolerated treatment well Patient left: in bed;with call bell/phone within reach   PT Visit Diagnosis: Muscle weakness (generalized) (M62.81);Difficulty in walking, not elsewhere classified (R26.2)    Time: [id number] PT Time Calculation (min) (ACUTE ONLY): 21 min   Charges:     PT Treatments $Therapeutic Activity: 8-22 mins          Doreatha Massed, PT Acute Rehabilitation

## 2019-03-12 NOTE — Progress Notes (Signed)
PROGRESS NOTE  [name] [id number] DOB: [birth year]   PCP: [name], MD  Patient is from: Home  DOA: 02/22/2019 LOS: 53  Brief Narrative / Interim history:  86 y.o. male with a PMH of advanced dementia, hypertension, hyperlipidemia, GERD and BPH who was admitted 02/22/2019 with a chief complaint of generalized weakness progressive over several weeks.  In the ED, he had low-grade temperature but was noted to be hypoxic with a room air saturation of 84% associated with tachycardia.  Chest x-ray was clear and CT was negative for acute infarct.  CT of the abdomen and pelvis showed a splenic mass and retroperitoneal adenopathy along with patchy bilateral airspace disease consistent with viral pneumonia.  COVID-19 testing was positive as was RPR and HIV testing.  T. Pallidum positive. However, RPR titer 1:1. Unclear if this was treated or untreated.  Confirmatory HIV test negative.  Infectious disease, IR and oncology consulted.  He underwent lumbar puncture on 02/28/2018.  Cytology not indicated for specific etiology.  CSF protein elevated to 100.  Gram stain and cultures negative so far.  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  Patient had biopsy of supraclavicular lymph nodes on 02/27/2018.  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  Oncology discussed about treatment options.  Plan is to wait until patient improves clinically.   IR to place PEG tube.  Patient to be discharged to SNF once he tolerates feeding via PEG tube.  Subjective: PEG tube placed yesterday.  No issues with tube feeds.  He is awake, alert and tracks.  Very hard of hearing. Oriented to self only.  Denies any complaints.  Objective: Vitals:   03/11/19 1507 03/11/19 1956 03/12/19 0631 03/12/19 1004  BP: 110/62 115/75 117/73 122/78  Pulse: 93 (!) 101 84 96  Resp: 18 16 16 16   Temp: 98.9 F (37.2 C) 97.9 F  (36.6 C) (!) 97.4 F (36.3 C) 98.1 F (36.7 C)  TempSrc: Axillary Oral Oral Oral  SpO2: 95% 97% 95% 93%  Weight:      Height:        Intake/Output Summary (Last 24 hours) at 03/12/2019 1237 Last data filed at 03/12/2019 1235 Gross per 24 hour  Intake 1734.87 ml  Output 1800 ml  Net -65.13 ml   Filed Weights   03/05/19 0500 03/06/19 0415 03/09/19 0610  Weight: 68.5 kg 67.6 kg 71.7 kg    Examination:  GENERAL: Chronically ill-appearing.  No apparent distress. HEENT: MMM.  Vision grossly intact.  Impaired hearing. NECK: Supple.  No apparent JVD.  RESP: On RA.  No IWOB.  Fair aeration bilaterally. CVS:  RRR. Heart sounds normal.  ABD/GI/GU: Bowel sounds present. Soft. Non tender.  MSK/EXT:  Moves extremities. No apparent deformity. No edema.  SKIN: no apparent skin lesion or wound NEURO: Awake and alert.  Oriented to self.  Barely follows command (suspect hearing impairment playing a role).  No apparent focal neuro deficit. PSYCH: Calm.  No signs of agitation.  Procedures:  1/9-PICC line placement 1/15-G-tube placement by IR.  Assessment & Plan: Pneumonia due to COVID-19 infection: CT chest with bibasilar groundglass opacities.  CRP normalized.  D-dimer remains elevated.  No respiratory distress.  Remained stable on room air. Recent Labs    03/10/19 0303 03/11/19 0500 03/12/19 0532  DDIMER 3.80* 2.90* 3.32*  Completed 5 days of remdesivir, Decadron -Subcu Lovenox for VTE prophylaxis. 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  Ammonia 36>14.  Thiamine and vitamin B12 normal.  COVID-19 and acute hospitalization could contribute.   Improving.  This may be his baseline given underlying advanced dementia. -Continue low-dose Seroquel at bedtime  -Delirium precautions -Bowel regimen with lactulose as  needed -Treating-tertiary syphilis as below  Possible neurosyphilis: RPR and T. Pallidum positive although low titer.  CSF protein elevated to 100.  Cytology not indicated for specific etiology.  CSF Gram stain and cultures negative so far.  CSF VDRL negative but this does not exclude per ID. -ID started continuous penicillin G infusion for a total of 14 days (end date 03/16/2019).  PICC line placed on 1/9.  Large-cell B-cell non-Hodgkin's lymphoma: Confirmed by supraclavicular lymph node biopsy on 1/6. Splenic mass/retroperitoneal right healer and left supraclavicular adenopathy likely due to lymphoma -Oncology discussed treatment options with family 1/13.  Plan is to wait until he improves from other medical condition and really address this outpatient. -Oncology wanted to maintain PICC line after completion of IV penicillin for potential chemotherapy.  Hyponatremia Sodium 126.  Chloride 94.  Likely dilutional since he is receiving free water flushes with his tube feeds.  Change free water flushes from sterile water to normal saline Gentle hydration with normal saline Recheck BMP tomorrow  Hypertension: Normotensive. -Continue holding Avapro.  Glaucoma -Continue home eyedrops.  BPH: Not on medication at home. -Monitor urinary output.  Leukocytosis/bandemia: No obvious source of infection on exam.  No fever.  Not on steroids. -Improved.  Afebrile.  Will hold off on further infectious work-up at this time.  Physical deconditioning/debility: -Supportive care.  Goal of care: Patient remains full code per family and they would want to continue routine care at this time.  Palliative care following. -  G-tube placed 1/15.    Moderate protein calorie malnutrition Tolerating G-tube feeds Nutrition Problem: Increased nutrient needs Etiology: acute illness(COVID-19)  Signs/Symptoms: estimated needs  Interventions: Tube feeding, Magic cup, Ensure Enlive (each supplement provides  350kcal and 20 grams of protein)   DVT prophylaxis: Subcu Lovenox Code Status: Full code Family Communication: Updated patient's son Jaxxon Datema via telephone Disposition Plan: Discussed with SW/CM.  Tentative plan for discharge tomorrow to SNF for rehab if sodium improves.  Screening COVID-19 test ordered for placement. Consultants: IR (off), ID (off), oncology, palliative care    Microbiology summarized: [id number] positive. Urine culture with multiple species.  Sch Meds:  Scheduled Meds: . vitamin C  500 mg Oral Daily  . aspirin EC  81 mg Oral Daily  . atorvastatin  5 mg Oral Daily  . brinzolamide  1 drop Both Eyes TID  . Chlorhexidine Gluconate Cloth  6 each Topical Daily  . Chlorhexidine Gluconate Cloth  6 each Topical Daily  . donepezil  10 mg Oral QHS  . enoxaparin (LOVENOX) injection  40 mg Subcutaneous Daily  . feeding supplement (ENSURE ENLIVE)  237 mL Oral TID BM  . feeding supplement (OSMOLITE 1.5 CAL)  1,000 mL Per Tube Q24H  . latanoprost  1 drop Both Eyes QHS  . multivitamin with minerals  1 tablet Oral Daily  . polyethylene glycol  17 g Oral Daily  . QUEtiapine  25 mg Oral QHS  . senna-docusate  1 tablet Oral BID  . zinc sulfate  220 mg Oral Daily   Continuous Infusions: . sodium chloride 50 mL/hr at 03/12/19 1128  . penicillin g continuous IV  infusion 12 Million Units (03/12/19 0645)   PRN Meds:.lactulose **OR** lactulose, sodium chloride flush  Antimicrobials: Anti-infectives (From admission, onward)   Start     Dose/Rate Route Frequency Ordered Stop   03/02/19 1800  penicillin G potassium 12 Million Units in dextrose 5 % 500 mL continuous infusion     12 Million Units 41.7 mL/hr over 12 Hours Intravenous Every 12 hours 03/02/19 1514 03/16/19 1759   02/24/19 1000  remdesivir 100 mg in sodium chloride 0.9 % 100 mL IVPB     100 mg 200 mL/hr over 30 Minutes Intravenous Daily 02/23/19 1519 02/27/19 1120   02/23/19 1600  cefTRIAXone (ROCEPHIN) 1 g in sodium  chloride 0.9 % 100 mL IVPB  Status:  Discontinued     1 g 200 mL/hr over 30 Minutes Intravenous Every 24 hours 02/22/19 1751 02/23/19 0818   02/23/19 1600  remdesivir 200 mg in sodium chloride 0.9% 250 mL IVPB     200 mg 580 mL/hr over 30 Minutes Intravenous Once 02/23/19 1519 02/23/19 1624   02/23/19 1500  azithromycin (ZITHROMAX) 500 mg in sodium chloride 0.9 % 250 mL IVPB  Status:  Discontinued     500 mg 250 mL/hr over 60 Minutes Intravenous Every 24 hours 02/22/19 1751 02/23/19 0818   02/22/19 1630  cefTRIAXone (ROCEPHIN) 1 g in sodium chloride 0.9 % 100 mL IVPB     1 g 200 mL/hr over 30 Minutes Intravenous  Once 02/22/19 1615 02/22/19 1729   02/22/19 1630  azithromycin (ZITHROMAX) 500 mg in sodium chloride 0.9 % 250 mL IVPB     500 mg 250 mL/hr over 60 Minutes Intravenous  Once 02/22/19 1615 02/22/19 1902       I have personally reviewed the following labs and images: CBC: Recent Labs  Lab 03/10/19 0303 03/11/19 0500 03/12/19 0532  WBC 20.4* 11.7* 12.1*  NEUTROABS 15.7*  --   --   HGB 12.9* 11.8* 12.0*  HCT 38.2* 35.5* 35.8*  MCV 93.9 93.7 93.5  PLT 230 219 228   BMP &GFR Recent Labs  Lab 03/08/19 0402 03/09/19 0500 03/10/19 0303 03/11/19 0500 03/12/19 0532  NA 131* 130* 131* 128* 126*  K 4.0 4.3 4.2 3.9 4.0  CL 98 96* 98 95* 94*  CO2 26 25 22 26 24   GLUCOSE 132* 128* 103* 110* 121*  BUN 22 22 23 20 17   CREATININE 0.51* 0.56* 0.64 0.38* 0.44*  CALCIUM 8.3* 8.6* 8.5* 8.2* 8.3*  MG 2.4 2.4 2.2 2.1 2.0  PHOS 3.1 3.3 3.2 2.6 2.6   Estimated Creatinine Clearance: 57.6 mL/min (A) (by C-G formula based on SCr of 0.44 mg/dL (L)). Liver & Pancreas: No results for input(s): AST, ALT, ALKPHOS, BILITOT, PROT, ALBUMIN in the last 168 hours. No results for input(s): LIPASE, AMYLASE in the last 168 hours. No results for input(s): AMMONIA in the last 168 hours. Diabetic: No results for input(s): HGBA1C in the last 72 hours. Recent Labs  Lab 03/11/19 2052  03/12/19 0025 03/12/19 0436 03/12/19 0735 03/12/19 1208  GLUCAP 111* 123* 117* 112* 118*   Cardiac Enzymes: No results for input(s): CKTOTAL, CKMB, CKMBINDEX, TROPONINI in the last 168 hours. No results for input(s): PROBNP in the last 8760 hours. Coagulation Profile: No results for input(s): INR, PROTIME in the last 168 hours. Thyroid Function Tests: No results for input(s): TSH, T4TOTAL, FREET4, T3FREE, THYROIDAB in the last 72 hours. Lipid Profile: No results for input(s): CHOL, HDL, LDLCALC, TRIG, CHOLHDL, LDLDIRECT in the last 72 hours.  Anemia Panel: No results for input(s): VITAMINB12, FOLATE, FERRITIN, TIBC, IRON, RETICCTPCT in the last 72 hours. Urine analysis:    Component Value Date/Time   COLORURINE YELLOW 02/22/2019 1226   APPEARANCEUR CLEAR 02/22/2019 1226   LABSPEC 1.019 02/22/2019 1226   PHURINE 7.0 02/22/2019 1226   GLUCOSEU NEGATIVE 02/22/2019 1226   HGBUR NEGATIVE 02/22/2019 1226   BILIRUBINUR NEGATIVE 02/22/2019 1226   BILIRUBINUR negative 02/05/2019 1610   KETONESUR NEGATIVE 02/22/2019 1226   PROTEINUR NEGATIVE 02/22/2019 1226   UROBILINOGEN 0.2 02/05/2019 1610   UROBILINOGEN 0.2 03/21/2007 0944   NITRITE NEGATIVE 02/22/2019 1226   LEUKOCYTESUR NEGATIVE 02/22/2019 1226   Sepsis Labs: Invalid input(s): PROCALCITONIN, [place]  Microbiology: No results found for this or any previous visit (from the past 240 hour(s)).  Radiology Studies: No results found.  Budd Palmer, MD Internal Medicine  Hospitalist    If 7PM-7AM, please contact night-coverage [email or web address] Password [facility] 03/12/2019, 12:37 PM

## 2019-03-12 NOTE — Telephone Encounter (Signed)
Spoke with son Perrie.  Reviewed recent hospitalization.  Planned PT re evaluation tomorrow to monitor ability to participate in SNF for rehab vs long term care facility.

## 2019-03-12 NOTE — Plan of Care (Signed)

## 2019-03-12 NOTE — Progress Notes (Signed)
Received call from [facility] that according to COVID-19 protocol per Dr. Johnnye Sima, patient needs to be on the Covid floor with airborne/contact precautions.  Transfer order placed.  Budd Palmer, MD Internal Medicine  Hospitalist

## 2019-03-13 LAB — BASIC METABOLIC PANEL
Anion gap: 8 (ref 5–15)
BUN: 14 mg/dL (ref 8–23)
CO2: 24 mmol/L (ref 22–32)
Calcium: 8.3 mg/dL — ABNORMAL LOW (ref 8.9–10.3)
Chloride: 95 mmol/L — ABNORMAL LOW (ref 98–111)
Creatinine, Ser: 0.44 mg/dL — ABNORMAL LOW (ref 0.61–1.24)
GFR calc Af Amer: >60 mL/min (ref >60)
GFR calc non Af Amer: >60 mL/min (ref >60)
Glucose, Bld: 106 mg/dL — ABNORMAL HIGH (ref 70–99)
Potassium: 3.9 mmol/L (ref 3.5–5.1)
Sodium: 127 mmol/L — ABNORMAL LOW (ref 135–145)

## 2019-03-13 LAB — GLUCOSE, CAPILLARY
Glucose-Capillary: 104 mg/dL — ABNORMAL HIGH (ref 70–99)
Glucose-Capillary: 115 mg/dL — ABNORMAL HIGH (ref 70–99)
Glucose-Capillary: 86 mg/dL (ref 70–99)
Glucose-Capillary: 91 mg/dL (ref 70–99)
Glucose-Capillary: 93 mg/dL (ref 70–99)
Glucose-Capillary: 99 mg/dL (ref 70–99)

## 2019-03-13 MED ORDER — SODIUM CHLORIDE 0.9 % IV SOLN
12.0000 10*6.[IU] | Freq: Two times a day (BID) | INTRAVENOUS | Status: AC
  Administered 2019-03-13 – 2019-03-16 (×6): 12 10*6.[IU] via INTRAVENOUS
  Filled 2019-03-13 (×7): qty 12

## 2019-03-13 NOTE — Plan of Care (Signed)
  Problem: Clinical Measurements: Goal: Respiratory complications will improve Outcome: Progressing   Problem: Elimination: Goal: Will not experience complications related to bowel motility Outcome: Progressing Goal: Will not experience complications related to urinary retention Outcome: Progressing   

## 2019-03-13 NOTE — Care Management Important Message (Signed)
Important Message  Patient Details IM Letter given to Evette Cristal SW Case Manager to present to the Patient Name: [name] MRN: [id number] Date of Birth: [birth year]   Medicare Important Message Given:  Yes     Kerin Salen 03/13/2019, 12:02 PM

## 2019-03-13 NOTE — Progress Notes (Signed)
PROGRESS NOTE  [name] [id number] DOB: 19-Jan-1929   PCP: [name], MD  Patient is from: Home  DOA: 02/22/2019 LOS: 54  Brief Narrative / Interim history:  86 y.o. male with a PMH of advanced dementia, hypertension, hyperlipidemia, GERD and BPH who was admitted 02/22/2019 with a chief complaint of generalized weakness progressive over several weeks.  In the ED, he had low-grade temperature but was noted to be hypoxic with a room air saturation of 84% associated with tachycardia.  Chest x-ray was clear and CT was negative for acute infarct.  CT of the abdomen and pelvis showed a splenic mass and retroperitoneal adenopathy along with patchy bilateral airspace disease consistent with viral pneumonia.  COVID-19 testing was positive as was RPR and HIV testing.  T. Pallidum positive. However, RPR titer 1:1. Unclear if this was treated or untreated.  Confirmatory HIV test negative.  Infectious disease, IR and oncology consulted.  He underwent lumbar puncture on 02/28/2018.  Cytology not indicated for specific etiology.  CSF protein elevated to 100.  Gram stain and cultures negative so far.  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  Patient had biopsy of supraclavicular lymph nodes on 02/27/2018.  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  Oncology discussed about treatment options.  Plan is to wait until patient improves clinically.   IR to place PEG tube.  Patient was to be discharged to SNF once he tolerates feeding via PEG tube but is now hyponatremic and facility is concerned that the patient would not do well with rehab and potentially become a long-term care resident.   Subjective: Sleeping. Worked with PT who recommended SNF but noted there is a possibility he may need long term care.   Objective: Vitals:   03/12/19 2159 03/13/19 0500 03/13/19 0800 03/13/19 1307  BP: 119/81 (!)  115/55  132/80  Pulse: 98 76  77  Resp: 16 16    Temp: 99.1 F (37.3 C) 98.1 F (36.7 C)  98.2 F (36.8 C)  TempSrc:  Oral  Oral  SpO2: 95% 98% 96% 99%  Weight:      Height:        Intake/Output Summary (Last 24 hours) at 03/13/2019 1327 Last data filed at 03/13/2019 1000 Gross per 24 hour  Intake 1770.56 ml  Output 700 ml  Net 1070.56 ml   Filed Weights   03/05/19 0500 03/06/19 0415 03/09/19 0610  Weight: 68.5 kg 67.6 kg 71.7 kg    Examination:  GENERAL: Chronically ill-appearing.  No apparent distress. HEENT: MMM.  Vision grossly intact.  Impaired hearing. NECK: Supple.  No apparent JVD.  RESP: On RA.  No IWOB.  Fair aeration bilaterally. CVS:  RRR. Heart sounds normal.  ABD/GI/GU: Bowel sounds present. Soft. Non tender.  MSK/EXT:  Moves extremities. No apparent deformity. No edema.  SKIN: no apparent skin lesion or wound NEURO: Sleepy. (Awakens barely follows command (suspect hearing impairment playing a role).  No apparent focal neuro deficit. PSYCH: Calm.  No signs of agitation.  Procedures:  1/9-PICC line placement 1/15-G-tube placement by IR.  Assessment & Plan: Pneumonia due to COVID-19 infection: CT chest with bibasilar groundglass opacities.  CRP normalized.  D-dimer remains elevated.  No respiratory distress.  Remained stable on room air. Recent Labs    03/11/19 0500 03/12/19 0532  DDIMER 2.90* 3.32*  Completed 5 days of remdesivir, Decadron -Subcu Lovenox for VTE prophylaxis.  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  Ammonia 36>14.  Thiamine and vitamin B12 normal.  COVID-19 and acute hospitalization could contribute.   Improving.  This may be his baseline given underlying advanced dementia. -Continue low-dose Seroquel at bedtime  -Delirium precautions -Bowel regimen with lactulose as needed -Treating-tertiary syphilis as below  Possible neurosyphilis: RPR and T. Pallidum positive although  low titer.  CSF protein elevated to 100.  Cytology not indicated for specific etiology.  CSF Gram stain and cultures negative so far.  CSF VDRL negative but this does not exclude per ID. -ID started continuous penicillin G infusion for a total of 14 days (end date 03/16/2019).  PICC line placed on 1/9.  Large-cell B-cell non-Hodgkin's lymphoma: Confirmed by supraclavicular lymph node biopsy on 1/6. Splenic mass/retroperitoneal right healer and left supraclavicular adenopathy likely due to lymphoma -Oncology discussed treatment options with family 1/13.  Plan is to wait until he improves from other medical condition and really address this outpatient. -Oncology wanted to maintain PICC line after completion of IV penicillin for potential chemotherapy.  Hyponatremia Sodium 126.  Chloride 94.  Likely dilutional since he is receiving free water flushes with his tube feeds. Changed flushes with tube feeds to normal saline with minimal improvement in sodium. He is receiving penicillin G and D5 and a total of almost 1 L of this.  Discussed with pharmacy and we are able to switch this to penicillin G and normal saline. Repeat BMP tomorrow  Hypertension: Normotensive. -Continue holding Avapro.  Glaucoma -Continue home eyedrops.  BPH: Not on medication at home. -Monitor urinary output.  Leukocytosis/bandemia: No obvious source of infection on exam.  No fever.  Not on steroids. -Improved.  Afebrile.  Will hold off on further infectious work-up at this time.  Physical deconditioning/debility: -PT/OT.  Goal of care: Patient remains full code per family and they would want to continue routine care at this time.  Palliative care following. -  G-tube placed 1/15.    Moderate protein calorie malnutrition Tolerating G-tube feeds Nutrition Problem: Increased nutrient needs Etiology: acute illness(COVID-19)  Signs/Symptoms: estimated needs  Interventions: Tube feeding, Magic cup, Ensure Enlive  (each supplement provides 350kcal and 20 grams of protein)   DVT prophylaxis: Subcu Lovenox Code Status: Full code Family Communication: Updated patient's son Vergie Bronner via telephone (discussed patient's decline and need for another goals of care discussion, he understands this and would like to give the patient another chance.  Requested he work with physical therapy with hearing aids in place.  Relayed this information to PT and primary RN) Disposition Plan: Screening COVID-19 test ordered for placement.  Declined by SNF with concern that patient may not do well with rehab and potentially.  A long-term care candidate.  Repeat PT evaluation ordered.  Appreciate recommendations. Consultants: IR (off), ID (off), oncology, palliative care    Microbiology summarized: [id number] positive. Urine culture with multiple species.  Sch Meds:  Scheduled Meds: . vitamin C  500 mg Oral Daily  . aspirin EC  81 mg Oral Daily  . atorvastatin  5 mg Oral Daily  . brinzolamide  1 drop Both Eyes TID  . Chlorhexidine Gluconate Cloth  6 each Topical Daily  . Chlorhexidine Gluconate Cloth  6 each Topical Daily  . donepezil  10 mg Oral QHS  . enoxaparin (LOVENOX) injection  40 mg Subcutaneous Daily  . feeding supplement (ENSURE ENLIVE)  237 mL Oral TID BM  . feeding supplement (OSMOLITE 1.5 CAL)  1,000 mL  Per Tube Q24H  . latanoprost  1 drop Both Eyes QHS  . multivitamin with minerals  1 tablet Oral Daily  . polyethylene glycol  17 g Oral Daily  . QUEtiapine  25 mg Oral QHS  . senna-docusate  1 tablet Oral BID  . zinc sulfate  220 mg Oral Daily   Continuous Infusions: . sodium chloride 10 mL (03/12/19 1942)  . penicillin g continuous IV infusion     PRN Meds:.lactulose **OR** lactulose, sodium chloride flush  Antimicrobials: Anti-infectives (From admission, onward)   Start     Dose/Rate Route Frequency Ordered Stop   03/13/19 1800  penicillin G potassium 12 Million Units in sodium chloride 0.9 % 500  mL continuous infusion     12 Million Units 41.7 mL/hr over 12 Hours Intravenous Every 12 hours 03/13/19 0816 03/16/19 1759   03/02/19 1800  penicillin G potassium 12 Million Units in dextrose 5 % 500 mL continuous infusion  Status:  Discontinued     12 Million Units 41.7 mL/hr over 12 Hours Intravenous Every 12 hours 03/02/19 1514 03/13/19 0818   02/24/19 1000  remdesivir 100 mg in sodium chloride 0.9 % 100 mL IVPB     100 mg 200 mL/hr over 30 Minutes Intravenous Daily 02/23/19 1519 02/27/19 1120   02/23/19 1600  cefTRIAXone (ROCEPHIN) 1 g in sodium chloride 0.9 % 100 mL IVPB  Status:  Discontinued     1 g 200 mL/hr over 30 Minutes Intravenous Every 24 hours 02/22/19 1751 02/23/19 0818   02/23/19 1600  remdesivir 200 mg in sodium chloride 0.9% 250 mL IVPB     200 mg 580 mL/hr over 30 Minutes Intravenous Once 02/23/19 1519 02/23/19 1624   02/23/19 1500  azithromycin (ZITHROMAX) 500 mg in sodium chloride 0.9 % 250 mL IVPB  Status:  Discontinued     500 mg 250 mL/hr over 60 Minutes Intravenous Every 24 hours 02/22/19 1751 02/23/19 0818   02/22/19 1630  cefTRIAXone (ROCEPHIN) 1 g in sodium chloride 0.9 % 100 mL IVPB     1 g 200 mL/hr over 30 Minutes Intravenous  Once 02/22/19 1615 02/22/19 1729   02/22/19 1630  azithromycin (ZITHROMAX) 500 mg in sodium chloride 0.9 % 250 mL IVPB     500 mg 250 mL/hr over 60 Minutes Intravenous  Once 02/22/19 1615 02/22/19 1902       I have personally reviewed the following labs and images: CBC: Recent Labs  Lab 03/10/19 0303 03/11/19 0500 03/12/19 0532  WBC 20.4* 11.7* 12.1*  NEUTROABS 15.7*  --   --   HGB 12.9* 11.8* 12.0*  HCT 38.2* 35.5* 35.8*  MCV 93.9 93.7 93.5  PLT 230 219 228   BMP &GFR Recent Labs  Lab 03/08/19 0402 03/08/19 0402 03/09/19 0500 03/10/19 0303 03/11/19 0500 03/12/19 0532 03/13/19 0449  NA 131*   < > 130* 131* 128* 126* 127*  K 4.0   < > 4.3 4.2 3.9 4.0 3.9  CL 98   < > 96* 98 95* 94* 95*  CO2 26   < > 25 22 26  24 24   GLUCOSE 132*   < > 128* 103* 110* 121* 106*  BUN 22   < > 22 23 20 17 14   CREATININE 0.51*   < > 0.56* 0.64 0.38* 0.44* 0.44*  CALCIUM 8.3*   < > 8.6* 8.5* 8.2* 8.3* 8.3*  MG 2.4  --  2.4 2.2 2.1 2.0  --   PHOS 3.1  --  3.3 3.2 2.6 2.6  --    < > = values in this interval not displayed.   Estimated Creatinine Clearance: 57.6 mL/min (A) (by C-G formula based on SCr of 0.44 mg/dL (L)). Liver & Pancreas: No results for input(s): AST, ALT, ALKPHOS, BILITOT, PROT, ALBUMIN in the last 168 hours. No results for input(s): LIPASE, AMYLASE in the last 168 hours. No results for input(s): AMMONIA in the last 168 hours. Diabetic: No results for input(s): HGBA1C in the last 72 hours. Recent Labs  Lab 03/12/19 1954 03/13/19 0042 03/13/19 0419 03/13/19 0750 03/13/19 1207  GLUCAP 104* 93 86 99 115*   Cardiac Enzymes: No results for input(s): CKTOTAL, CKMB, CKMBINDEX, TROPONINI in the last 168 hours. No results for input(s): PROBNP in the last 8760 hours. Coagulation Profile: No results for input(s): INR, PROTIME in the last 168 hours. Thyroid Function Tests: No results for input(s): TSH, T4TOTAL, FREET4, T3FREE, THYROIDAB in the last 72 hours. Lipid Profile: No results for input(s): CHOL, HDL, LDLCALC, TRIG, CHOLHDL, LDLDIRECT in the last 72 hours. Anemia Panel: No results for input(s): VITAMINB12, FOLATE, FERRITIN, TIBC, IRON, RETICCTPCT in the last 72 hours. Urine analysis:    Component Value Date/Time   COLORURINE YELLOW 02/22/2019 1226   APPEARANCEUR CLEAR 02/22/2019 1226   LABSPEC 1.019 02/22/2019 1226   PHURINE 7.0 02/22/2019 1226   GLUCOSEU NEGATIVE 02/22/2019 1226   HGBUR NEGATIVE 02/22/2019 1226   BILIRUBINUR NEGATIVE 02/22/2019 1226   BILIRUBINUR negative 02/05/2019 1610   KETONESUR NEGATIVE 02/22/2019 1226   PROTEINUR NEGATIVE 02/22/2019 1226   UROBILINOGEN 0.2 02/05/2019 1610   UROBILINOGEN 0.2 03/21/2007 0944   NITRITE NEGATIVE 02/22/2019 1226   LEUKOCYTESUR  NEGATIVE 02/22/2019 1226   Sepsis Labs: Invalid input(s): PROCALCITONIN, [place]  Microbiology: Recent Results (from the past 240 hour(s))  SARS CORONAVIRUS 2 (TAT 6-24 HRS) Nasopharyngeal Nasopharyngeal Swab     Status: None   Collection Time: 03/12/19 12:40 PM   Specimen: Nasopharyngeal Swab  Result Value Ref Range Status   SARS Coronavirus 2 NEGATIVE NEGATIVE Final    Comment: (NOTE) SARS-CoV-2 target nucleic acids are NOT DETECTED. The SARS-CoV-2 RNA is generally detectable in upper and lower respiratory specimens during the acute phase of infection. Negative results do not preclude SARS-CoV-2 infection, do not rule out co-infections with other pathogens, and should not be used as the sole basis for treatment or other patient management decisions. Negative results must be combined with clinical observations, patient history, and epidemiological information. The expected result is Negative. Fact Sheet for Patients: SugarRoll.be Fact Sheet for Healthcare Providers: https://www.woods-mathews.com/ This test is not yet approved or cleared by the [place] FDA and  has been authorized for detection and/or diagnosis of SARS-CoV-2 by FDA under an Emergency Use Authorization (EUA). This EUA will remain  in effect (meaning this test can be used) for the duration of the COVID-19 declaration under Section 56 4(b)(1) of the Act, 21 U.S.C. section 360bbb-3(b)(1), unless the authorization is terminated or revoked sooner. Performed at [facility], [place] [address], [place], [place] [address]     Radiology Studies: No results found.  Budd Palmer, MD Internal Medicine  Hospitalist    If 7PM-7AM, please contact night-coverage [email or web address] Password [email or web address] 03/13/2019, 1:27 PM

## 2019-03-13 NOTE — Progress Notes (Signed)
Occupational Therapy Treatment and D/C from acute OT services Patient Details Name: [name] MRN: [id number] DOB: August 22, 1928 Today's Date: 03/13/2019    History of present illness Pt admitted from home Covid + and with hx of dementia.  Pt now additionally being treated for non-Hodgkin's lymphoma, encephalopathy, and possible neurosyphilis.  Pt now with NG feeding tube and per notes to get PEG tube tomorrow.   OT comments  Pt did not follow commands, but did assist with RUE exercises.  Decline in function since eval.  Will defer further OT intervention to next venue of care  Follow Up Recommendations  SNF(vs LTC)    Equipment Recommendations  None recommended by OT    Recommendations for Other Services      Precautions / Restrictions Precautions Precautions: Fall Precaution Comments: NG feeding tube Restrictions Weight Bearing Restrictions: No       Mobility Bed Mobility                  Transfers                      Balance                                           ADL either performed or assessed with clinical judgement   ADL                                         General ADL Comments: total A for hand over hand grooming; washing face and combing hair. Pt brought hand to face several times spontaneously.       Vision       Perception     Praxis      Cognition Arousal/Alertness: Awake/alert Behavior During Therapy: WFL for tasks assessed/performed Overall Cognitive Status: No family/caregiver present to determine baseline cognitive functioning                                 General Comments: Pt initially sleepy, but opened and maintained eyes open part of the way through session. didn't follow commands        Exercises Exercises: Other exercises Other Exercises Other Exercises: pt assisted with RUE FF; did not assist with L, more resistive.  Pt with bil good grips   Shoulder  Instructions       General Comments      Pertinent Vitals/ Pain       Pain Assessment: Faces Pain Score: 0-No pain  Home Living                                          Prior Functioning/Environment              Frequency  Min 2X/week        Progress Toward Goals  OT Goals(current goals can now be found in the care plan section)  Progress towards OT goals: Not progressing toward goals - comment(defer further intervention to SNF)     Plan      Co-evaluation  AM-PAC OT "6 Clicks" Daily Activity     Outcome Measure   Help from another person eating meals?: Total Help from another person taking care of personal grooming?: Total Help from another person toileting, which includes using toliet, bedpan, or urinal?: Total Help from another person bathing (including washing, rinsing, drying)?: Total Help from another person to put on and taking off regular upper body clothing?: Total Help from another person to put on and taking off regular lower body clothing?: Total 6 Click Score: 6    End of Session    OT Visit Diagnosis: Muscle weakness (generalized) (M62.81);Cognitive communication deficit (R41.841)   Activity Tolerance Patient tolerated treatment well   Patient Left in bed;with call bell/phone within reach   Nurse Communication          Time: [id number] OT Time Calculation (min): 15 min  Charges: OT General Charges $OT Visit: 1 Visit OT Treatments $Therapeutic Activity: 8-22 mins  Rylee Nuzum S, OTR/L Acute Rehabilitation Services 03/13/2019   Alaiya Martindelcampo 03/13/2019, 10:56 AM

## 2019-03-13 NOTE — Plan of Care (Signed)
  Problem: Education: Goal: Knowledge of General Education information will improve Description: Including pain rating scale, medication(s)/side effects and non-pharmacologic comfort measures Outcome: Not Progressing   Problem: Health Behavior/Discharge Planning: Goal: Ability to manage health-related needs will improve Outcome: Not Progressing   Problem: Nutrition: Goal: Adequate nutrition will be maintained Outcome: Not Progressing   

## 2019-03-14 ENCOUNTER — Inpatient Hospital Stay (HOSPITAL_COMMUNITY): Payer: Medicare Other

## 2019-03-14 LAB — BASIC METABOLIC PANEL
Anion gap: 7 (ref 5–15)
BUN: 14 mg/dL (ref 8–23)
CO2: 24 mmol/L (ref 22–32)
Calcium: 8.4 mg/dL — ABNORMAL LOW (ref 8.9–10.3)
Chloride: 97 mmol/L — ABNORMAL LOW (ref 98–111)
Creatinine, Ser: 0.43 mg/dL — ABNORMAL LOW (ref 0.61–1.24)
GFR calc Af Amer: >60 mL/min (ref >60)
GFR calc non Af Amer: >60 mL/min (ref >60)
Glucose, Bld: 119 mg/dL — ABNORMAL HIGH (ref 70–99)
Potassium: 3.8 mmol/L (ref 3.5–5.1)
Sodium: 128 mmol/L — ABNORMAL LOW (ref 135–145)

## 2019-03-14 LAB — GLUCOSE, CAPILLARY
Glucose-Capillary: 104 mg/dL — ABNORMAL HIGH (ref 70–99)
Glucose-Capillary: 108 mg/dL — ABNORMAL HIGH (ref 70–99)
Glucose-Capillary: 112 mg/dL — ABNORMAL HIGH (ref 70–99)
Glucose-Capillary: 130 mg/dL — ABNORMAL HIGH (ref 70–99)
Glucose-Capillary: 91 mg/dL (ref 70–99)
Glucose-Capillary: 91 mg/dL (ref 70–99)
Glucose-Capillary: 95 mg/dL (ref 70–99)

## 2019-03-14 MED ORDER — OSMOLITE 1.2 CAL PO LIQD
1000.0000 mL | ORAL | Status: DC
  Administered 2019-03-14 – 2019-03-15 (×2): 1000 mL
  Filled 2019-03-14 (×5): qty 1000

## 2019-03-14 NOTE — NC FL2 (Signed)
[place] LEVEL OF CARE SCREENING TOOL     IDENTIFICATION  Patient Name: [name] Birthdate: 1928-08-27 Sex: male Admission Date (Current Location): 02/22/2019  [facility] and [place] Number:  Herbalist and Address:  [facility],  [address] [place], [place]      Provider Number: [id number]  Attending Physician Name and Address:  Lucky Cowboy, MD  Relative Name and Phone Number:  Khayree, [place] [phone] or Reczek,phil Son   [phone]    Current Level of Care: Hospital Recommended Level of Care: [place] Prior Approval Number:    Date Approved/Denied:   PASRR Number: [id number]  Discharge Plan: SNF    Current Diagnoses: Patient Active Problem List   Diagnosis Date Noted  . Diffuse large cell non-Hodgkin's lymphoma ([place]) 03/07/2019  . Weakness 02/23/2019  . Hypokalemia 02/23/2019  . Lab test positive for detection of COVID-19 virus 02/23/2019  . Cerebral atrophy ([place]) 02/23/2019  . Paranasal sinus disease 02/23/2019  . Retroperitoneal lymphadenopathy 02/23/2019  . Splenic mass 02/23/2019  . Pneumonia due to COVID-19 virus 02/23/2019  . Advanced dementia ([place]) 02/23/2019  . Delirium due to another medical condition, acute, hyperactive 02/23/2019  . Loss of appetite 02/23/2019  . Hyperlipidemia 02/23/2019  . GERD (gastroesophageal reflux disease) 02/23/2019  . HIV test positive ([place]) 02/23/2019  . Aorto-iliac atherosclerosis ([place]) 02/08/2019  . Constipation 02/08/2019  . Lumbar scoliosis 04/20/2018  . Prostate nodule 04/20/2018  . Hearing loss 10/19/2017  . Benign prostatic hyperplasia with urinary obstruction   . Skin lesions 10/10/2014  . Lower back pain 10/10/2014  . Advanced care planning/counseling discussion 03/20/2014  . MCI (mild cognitive impairment) with memory loss 03/20/2014  . Medicare annual wellness visit, subsequent 01/04/2012  . Cardiac murmur 01/04/2012  .  Dyslipidemia 03/31/2010  . Glaucoma 03/31/2010  . Essential hypertension 03/31/2010  . Allergic rhinitis 03/31/2010  . SMALL BOWEL OBSTRUCTION, HX OF 03/31/2010  . NEPHROLITHIASIS, HX OF 03/31/2010    Orientation RESPIRATION BLADDER Height & Weight     Self  Normal Incontinent Weight: 158 lb (71.7 kg) Height:  5\' 6"  (167.6 cm)  BEHAVIORAL SYMPTOMS/MOOD NEUROLOGICAL BOWEL NUTRITION STATUS      Incontinent Diet(Dysphagia 3)  AMBULATORY STATUS COMMUNICATION OF NEEDS Skin   Limited Assist Verbally PU Stage and Appropriate Care   PU Stage 2 Dressing: (PRN dressing changes)                   Personal Care Assistance Level of Assistance  Feeding, Dressing, Bathing Bathing Assistance: Limited assistance Feeding assistance: Limited assistance Dressing Assistance: Limited assistance     Functional Limitations Info  Sight, Hearing, Speech Sight Info: Adequate Hearing Info: Adequate Speech Info: Adequate    SPECIAL CARE FACTORS FREQUENCY  PT (By licensed PT), OT (By licensed OT)     PT Frequency: Minimum 5x a week OT Frequency: Minimum 5x a week            Contractures Contractures Info: Not present    Additional Factors Info  Code Status, Allergies, Psychotropic, Isolation Precautions Code Status Info: Full code Allergies Info: NKA Psychotropic Info: QUEtiapine (SEROQUEL) tablet 25 mg   Isolation Precautions Info: Drop/Con precautions     Current Medications (03/14/2019):  This is the current hospital active medication list Current Facility-Administered Medications  Medication Dose Route Frequency Provider Last Rate Last Admin  . 0.9 %  sodium chloride infusion   Intravenous Continuous Lucky Cowboy, MD 10 mL/hr at  03/12/19 1942 10 mL at 03/12/19 1942  . ascorbic acid (VITAMIN C) tablet 500 mg  500 mg Oral Daily Wendee Beavers T, MD   500 mg at 03/14/19 0906  . aspirin EC tablet 81 mg  81 mg Oral Daily Wendee Beavers T, MD   81 mg at 03/14/19 0907  . atorvastatin  (LIPITOR) tablet 5 mg  5 mg Oral Daily Wendee Beavers T, MD   5 mg at 03/14/19 0906  . brinzolamide (AZOPT) 1 % ophthalmic suspension 1 drop  1 drop Both Eyes TID Wendee Beavers T, MD   1 drop at 03/14/19 1504  . Chlorhexidine Gluconate Cloth 2 % PADS 6 each  6 each Topical Daily Mercy Riding, MD   6 each at 03/14/19 0912  . Chlorhexidine Gluconate Cloth 2 % PADS 6 each  6 each Topical Daily Mercy Riding, MD   6 each at 03/13/19 0939  . donepezil (ARICEPT) tablet 10 mg  10 mg Oral QHS Wendee Beavers T, MD   10 mg at 03/13/19 2126  . enoxaparin (LOVENOX) injection 40 mg  40 mg Subcutaneous Daily Wendee Beavers T, MD   40 mg at 03/14/19 0907  . feeding supplement (OSMOLITE 1.2 CAL) liquid 1,000 mL  1,000 mL Per Tube Continuous Lucky Cowboy, MD 30 mL/hr at 03/14/19 1505 Rate Change at 03/14/19 1505  . lactulose (CHRONULAC) 10 GM/15ML solution 30 g  30 g Oral BID PRN Gonfa, Taye T, MD       Or  . lactulose (CHRONULAC) enema 200 gm  300 mL Rectal BID PRN Gonfa, Taye T, MD      . latanoprost (XALATAN) 0.005 % ophthalmic solution 1 drop  1 drop Both Eyes QHS Wendee Beavers T, MD   1 drop at 03/13/19 2126  . multivitamin with minerals tablet 1 tablet  1 tablet Oral Daily Mercy Riding, MD   1 tablet at 03/14/19 0906  . penicillin G potassium 12 Million Units in sodium chloride 0.9 % 500 mL continuous infusion  12 Million Units Intravenous Q12H Lucky Cowboy, MD 41.7 mL/hr at 03/14/19 0605 12 Million Units at 03/14/19 0605  . polyethylene glycol (MIRALAX / GLYCOLAX) packet 17 g  17 g Oral Daily Wendee Beavers T, MD   17 g at 03/14/19 0907  . QUEtiapine (SEROQUEL) tablet 25 mg  25 mg Oral QHS Wendee Beavers T, MD   25 mg at 03/13/19 2126  . senna-docusate (Senokot-S) tablet 1 tablet  1 tablet Oral BID Mercy Riding, MD   1 tablet at 03/14/19 0906  . sodium chloride flush (NS) 0.9 % injection 10-40 mL  10-40 mL Intracatheter PRN Wendee Beavers T, MD   10 mL at 03/06/19 0520  . zinc sulfate capsule 220 mg  220 mg Oral Daily  Wendee Beavers T, MD   220 mg at 03/14/19 0907     Discharge Medications: Please see discharge summary for a list of discharge medications.  Relevant Imaging Results:  Relevant Lab Results:   Additional Information SSN [id number]  Ross Ludwig, LCSW

## 2019-03-14 NOTE — TOC Progression Note (Signed)
Transition of Care [facility]) - Progression Note    Patient Details  Name: [name] MRN: [id number] Date of Birth: 05-Feb-1929  [facility] of [facility]) CM/SW Contact  Ross Ludwig,  Phone Number: 03/14/2019, 4:57 PM  Clinical Narrative:     CSW sent updated clinicals to SNFs trying to find a bed for patient.  CSW awaiting for new bed offers.    Barriers to Discharge: Continued Medical Work up, Other (comment)  Expected Discharge Plan and Services                                                 Social Determinants of Health (SDOH) Interventions    Readmission Risk Interventions No flowsheet data found.

## 2019-03-14 NOTE — Progress Notes (Signed)
Patient was bleeding from G tube, gown and bed pad was saturated. Feedings were stopped. MD was notified, agreed to stop tube feedings, ordered Abdominal xray to check for placement, pressure was held on bleeding site until bleeding stopped. Patients heart rate was elevated to 123 however patient was log rolling to get cleaned up. Will recheck and continue to monitor.

## 2019-03-14 NOTE — Progress Notes (Signed)
PROGRESS NOTE  [name] [id number] DOB: [birth year]   PCP: [name], MD  Patient is from: Home  DOA: 02/22/2019 LOS: 60  Brief Narrative / Interim history:  86 y.o. male with a PMH of advanced dementia, hypertension, hyperlipidemia, GERD and BPH who was admitted 02/22/2019 with a chief complaint of generalized weakness progressive over several weeks.  In the ED, he had low-grade temperature but was noted to be hypoxic with a room air saturation of 84% associated with tachycardia.  Chest x-ray was clear and CT was negative for acute infarct.  CT of the abdomen and pelvis showed a splenic mass and retroperitoneal adenopathy along with patchy bilateral airspace disease consistent with viral pneumonia.  COVID-19 testing was positive as was RPR and HIV testing.  T. Pallidum positive. However, RPR titer 1:1. Unclear if this was treated or untreated.  Confirmatory HIV test negative.  Infectious disease, IR and oncology consulted.  He underwent lumbar puncture on 02/28/2018.  Cytology not indicated for specific etiology.  CSF protein elevated to 100.  Gram stain and cultures negative so far.  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  Patient had biopsy of supraclavicular lymph nodes on 02/27/2018.  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  Oncology discussed about treatment options.  Plan is to wait until patient improves clinically.   IR to place PEG tube.  Patient was to be discharged to SNF once he tolerates feeding via PEG tube but is now hyponatremic and facility is concerned that the patient would not do well with rehab and potentially become a long-term care resident.   Subjective: Awake.  Trying to communicate.  Unable to find hearing aids at bedside.  Objective: Vitals:   03/13/19 2028 03/14/19 0424 03/14/19 0829 03/14/19 1409  BP: 129/81 125/65  137/75  Pulse: 78 84  78    Resp: 18 16  18   Temp: 98.4 F (36.9 C) 98.4 F (36.9 C)  97.7 F (36.5 C)  TempSrc: Axillary Axillary  Oral  SpO2: 97% 97% 94% 99%  Weight:      Height:        Intake/Output Summary (Last 24 hours) at 03/14/2019 1704 Last data filed at 03/14/2019 1400 Gross per 24 hour  Intake 1971.49 ml  Output 250 ml  Net 1721.49 ml   Filed Weights   03/05/19 0500 03/06/19 0415 03/09/19 0610  Weight: 68.5 kg 67.6 kg 71.7 kg    Examination:  GENERAL: Chronically ill-appearing.  No apparent distress. HEENT: MMM.  Vision grossly intact.  Impaired hearing. NECK: Supple.  No apparent JVD.  RESP: On RA.  No IWOB.  Fair aeration bilaterally. CVS:  RRR. Heart sounds normal.  ABD/GI/GU: Bowel sounds present. Soft. Non tender.  MSK/EXT:  Moves extremities. No apparent deformity. No edema.  SKIN: no apparent skin lesion or wound NEURO: Awake, attempts to speak but conversational difficult due to hearing impairment (unable to find hearing aids).  No apparent focal neuro deficit. PSYCH: Calm.  No signs of agitation.  Procedures:  1/9-PICC line placement 1/15-G-tube placement by IR.  Assessment & Plan: Pneumonia due to COVID-19 infection: CT chest with bibasilar groundglass opacities.  CRP normalized.  D-dimer remains elevated.  No respiratory distress.  Remained stable on room air. Recent Labs    03/12/19 0532  DDIMER 3.32*  Completed 5 days of remdesivir, Decadron -Subcu Lovenox for VTE prophylaxis.  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  Ammonia 36>14.  Thiamine and vitamin B12 normal.  COVID-19 and acute hospitalization could contribute.   Improving.  This may be his baseline given underlying advanced dementia. -Continue low-dose Seroquel at bedtime  -Delirium precautions -Bowel regimen with lactulose as needed -Treating-tertiary syphilis as below  Possible neurosyphilis: RPR and T. Pallidum positive although low titer.  CSF  protein elevated to 100.  Cytology not indicated for specific etiology.  CSF Gram stain and cultures negative so far.  CSF VDRL negative but this does not exclude per ID. -ID started continuous penicillin G infusion for a total of 14 days (end date 03/16/2019).  PICC line placed on 1/9.  Large-cell B-cell non-Hodgkin's lymphoma: Confirmed by supraclavicular lymph node biopsy on 1/6. Splenic mass/retroperitoneal right healer and left supraclavicular adenopathy likely due to lymphoma -Oncology discussed treatment options with family 1/13.  Plan is to wait until he improves from other medical condition and really address this outpatient. -Oncology wanted to maintain PICC line after completion of IV penicillin for potential chemotherapy.  Hyponatremia Sodium 126.  Chloride 94.  Likely dilutional since he is receiving free water flushes with his tube feeds. Changed flushes with tube feeds to normal saline with minimal improvement in sodium. Switch diluent of penicillin G from D5 to normal saline Gradually improving  Hypertension: Normotensive. -Continue holding Avapro.  Glaucoma -Continue home eyedrops.  BPH: Not on medication at home. -Monitor urinary output.  Leukocytosis/bandemia: No obvious source of infection on exam.  No fever.  Not on steroids. -Improved.  Afebrile.  Will hold off on further infectious work-up at this time.  Physical deconditioning/debility: -PT/OT.  Goal of care: Patient remains full code per family and they would want to continue routine care at this time.  Palliative care following. -  G-tube placed 1/15.    Moderate protein calorie malnutrition Tolerating G-tube feeds Nutrition Problem: Increased nutrient needs Etiology: acute illness(COVID-19)  Signs/Symptoms: estimated needs  Interventions: Tube feeding   DVT prophylaxis: Subcu Lovenox Code Status: Full code Family Communication:  Disposition Plan: Screening COVID-19 test ordered for placement.   Declined by [facility] SNF with concern that patient may not do well with rehab and potentially a long-term care candidate.  Repeat PT evaluation ordered.  Appreciate recommendations.  Case management working on finding SNF for rehab for him.  Pending placement.  If we are unable to find SNF who would accept him for short-term rehab, will have to have another goals of care discussion with his son. Consultants: IR (off), ID (off), oncology, palliative care    Microbiology summarized: [id number] positive. Urine culture with multiple species.  Sch Meds:  Scheduled Meds: . vitamin C  500 mg Oral Daily  . aspirin EC  81 mg Oral Daily  . atorvastatin  5 mg Oral Daily  . brinzolamide  1 drop Both Eyes TID  . Chlorhexidine Gluconate Cloth  6 each Topical Daily  . Chlorhexidine Gluconate Cloth  6 each Topical Daily  . donepezil  10 mg Oral QHS  . enoxaparin (LOVENOX) injection  40 mg Subcutaneous Daily  . latanoprost  1 drop Both Eyes QHS  . multivitamin with minerals  1 tablet Oral Daily  . polyethylene glycol  17 g Oral Daily  . QUEtiapine  25 mg Oral QHS  . senna-docusate  1 tablet Oral BID  . zinc sulfate  220 mg Oral Daily   Continuous Infusions: . sodium chloride 10 mL (03/12/19 1942)  . feeding supplement (OSMOLITE 1.2 CAL) 30  mL/hr at 03/14/19 1505  . penicillin g continuous IV infusion 12 Million Units (03/14/19 0605)   PRN Meds:.lactulose **OR** lactulose, sodium chloride flush  Antimicrobials: Anti-infectives (From admission, onward)   Start     Dose/Rate Route Frequency Ordered Stop   03/13/19 1800  penicillin G potassium 12 Million Units in sodium chloride 0.9 % 500 mL continuous infusion     12 Million Units 41.7 mL/hr over 12 Hours Intravenous Every 12 hours 03/13/19 0816 03/16/19 1759   03/02/19 1800  penicillin G potassium 12 Million Units in dextrose 5 % 500 mL continuous infusion  Status:  Discontinued     12 Million Units 41.7 mL/hr over 12 Hours Intravenous Every  12 hours 03/02/19 1514 03/13/19 0818   02/24/19 1000  remdesivir 100 mg in sodium chloride 0.9 % 100 mL IVPB     100 mg 200 mL/hr over 30 Minutes Intravenous Daily 02/23/19 1519 02/27/19 1120   02/23/19 1600  cefTRIAXone (ROCEPHIN) 1 g in sodium chloride 0.9 % 100 mL IVPB  Status:  Discontinued     1 g 200 mL/hr over 30 Minutes Intravenous Every 24 hours 02/22/19 1751 02/23/19 0818   02/23/19 1600  remdesivir 200 mg in sodium chloride 0.9% 250 mL IVPB     200 mg 580 mL/hr over 30 Minutes Intravenous Once 02/23/19 1519 02/23/19 1624   02/23/19 1500  azithromycin (ZITHROMAX) 500 mg in sodium chloride 0.9 % 250 mL IVPB  Status:  Discontinued     500 mg 250 mL/hr over 60 Minutes Intravenous Every 24 hours 02/22/19 1751 02/23/19 0818   02/22/19 1630  cefTRIAXone (ROCEPHIN) 1 g in sodium chloride 0.9 % 100 mL IVPB     1 g 200 mL/hr over 30 Minutes Intravenous  Once 02/22/19 1615 02/22/19 1729   02/22/19 1630  azithromycin (ZITHROMAX) 500 mg in sodium chloride 0.9 % 250 mL IVPB     500 mg 250 mL/hr over 60 Minutes Intravenous  Once 02/22/19 1615 02/22/19 1902       I have personally reviewed the following labs and images: CBC: Recent Labs  Lab 03/10/19 0303 03/11/19 0500 03/12/19 0532  WBC 20.4* 11.7* 12.1*  NEUTROABS 15.7*  --   --   HGB 12.9* 11.8* 12.0*  HCT 38.2* 35.5* 35.8*  MCV 93.9 93.7 93.5  PLT 230 219 228   BMP &GFR Recent Labs  Lab 03/08/19 0402 03/08/19 0402 03/09/19 0500 03/09/19 0500 03/10/19 0303 03/11/19 0500 03/12/19 0532 03/13/19 0449 03/14/19 0500  NA 131*   < > 130*   < > 131* 128* 126* 127* 128*  K 4.0   < > 4.3   < > 4.2 3.9 4.0 3.9 3.8  CL 98   < > 96*   < > 98 95* 94* 95* 97*  CO2 26   < > 25   < > 22 26 24 24 24   GLUCOSE 132*   < > 128*   < > 103* 110* 121* 106* 119*  BUN 22   < > 22   < > 23 20 17 14 14   CREATININE 0.51*   < > 0.56*   < > 0.64 0.38* 0.44* 0.44* 0.43*  CALCIUM 8.3*   < > 8.6*   < > 8.5* 8.2* 8.3* 8.3* 8.4*  MG 2.4  --  2.4   --  2.2 2.1 2.0  --   --   PHOS 3.1  --  3.3  --  3.2 2.6 2.6  --   --    < > =  values in this interval not displayed.   Estimated Creatinine Clearance: 57.6 mL/min (A) (by C-G formula based on SCr of 0.43 mg/dL (L)). Liver & Pancreas: No results for input(s): AST, ALT, ALKPHOS, BILITOT, PROT, ALBUMIN in the last 168 hours. No results for input(s): LIPASE, AMYLASE in the last 168 hours. No results for input(s): AMMONIA in the last 168 hours. Diabetic: No results for input(s): HGBA1C in the last 72 hours. Recent Labs  Lab 03/14/19 0034 03/14/19 0427 03/14/19 0735 03/14/19 1129 03/14/19 1633  GLUCAP 130* 108* 112* 91 95   Cardiac Enzymes: No results for input(s): CKTOTAL, CKMB, CKMBINDEX, TROPONINI in the last 168 hours. No results for input(s): PROBNP in the last 8760 hours. Coagulation Profile: No results for input(s): INR, PROTIME in the last 168 hours. Thyroid Function Tests: No results for input(s): TSH, T4TOTAL, FREET4, T3FREE, THYROIDAB in the last 72 hours. Lipid Profile: No results for input(s): CHOL, HDL, LDLCALC, TRIG, CHOLHDL, LDLDIRECT in the last 72 hours. Anemia Panel: No results for input(s): VITAMINB12, FOLATE, FERRITIN, TIBC, IRON, RETICCTPCT in the last 72 hours. Urine analysis:    Component Value Date/Time   COLORURINE YELLOW 02/22/2019 1226   APPEARANCEUR CLEAR 02/22/2019 1226   LABSPEC 1.019 02/22/2019 1226   PHURINE 7.0 02/22/2019 1226   GLUCOSEU NEGATIVE 02/22/2019 1226   HGBUR NEGATIVE 02/22/2019 1226   BILIRUBINUR NEGATIVE 02/22/2019 1226   BILIRUBINUR negative 02/05/2019 1610   KETONESUR NEGATIVE 02/22/2019 1226   PROTEINUR NEGATIVE 02/22/2019 1226   UROBILINOGEN 0.2 02/05/2019 1610   UROBILINOGEN 0.2 03/21/2007 0944   NITRITE NEGATIVE 02/22/2019 1226   LEUKOCYTESUR NEGATIVE 02/22/2019 1226   Sepsis Labs: Invalid input(s): PROCALCITONIN, [place]  Microbiology: Recent Results (from the past 240 hour(s))  SARS CORONAVIRUS 2 (TAT 6-24  HRS) Nasopharyngeal Nasopharyngeal Swab     Status: None   Collection Time: 03/12/19 12:40 PM   Specimen: Nasopharyngeal Swab  Result Value Ref Range Status   SARS Coronavirus 2 NEGATIVE NEGATIVE Final    Comment: (NOTE) SARS-CoV-2 target nucleic acids are NOT DETECTED. The SARS-CoV-2 RNA is generally detectable in upper and lower respiratory specimens during the acute phase of infection. Negative results do not preclude SARS-CoV-2 infection, do not rule out co-infections with other pathogens, and should not be used as the sole basis for treatment or other patient management decisions. Negative results must be combined with clinical observations, patient history, and epidemiological information. The expected result is Negative. Fact Sheet for Patients: SugarRoll.be Fact Sheet for Healthcare Providers: https://www.woods-mathews.com/ This test is not yet approved or cleared by the [place] FDA and  has been authorized for detection and/or diagnosis of SARS-CoV-2 by FDA under an Emergency Use Authorization (EUA). This EUA will remain  in effect (meaning this test can be used) for the duration of the COVID-19 declaration under Section 56 4(b)(1) of the Act, 21 U.S.C. section 360bbb-3(b)(1), unless the authorization is terminated or revoked sooner. Performed at [facility], [place] [address] [place], [place] [address]     Radiology Studies: No results found.  Budd Palmer, MD Internal Medicine  Hospitalist    If 7PM-7AM, please contact night-coverage [email or web address] Password [facility] 03/14/2019, 5:04 PM

## 2019-03-14 NOTE — Progress Notes (Signed)
Nutrition Follow-up  RD working remotely.   DOCUMENTATION CODES:   Not applicable  INTERVENTION:  - will adjust TF regimen: Osmolite 1.2 @ 20 ml/hr advance by 10 ml every 4 hours to reach goal rate of 60 ml/hr. - at goal rate, this regimen will provide 1728 kcal, 80 grams protein, and 1181 ml free water. - free water flush, if desired, to be per MD.   NUTRITION DIAGNOSIS:   Increased nutrient needs related to acute illness(COVID-19) as evidenced by estimated needs. -ongoing  GOAL:   Patient will meet greater than or equal to 90% of their needs -not met with current TF rate  MONITOR:   TF tolerance, Labs, Weight trends, Skin  ASSESSMENT:   86 y.o. male with medical history of advanced dementia, HTN, hyperlipidemia, GERD, and BPH. He was admitted on 12/31 with generalized weakness, progressive over several weeks.  In the ED, he had low-grade temperature and was noted to be hypoxic with a room air saturation of 84% associated with tachycardia.  CXR was clear and CT was negative for acute infarct.  CT abdomen/pelvis showed a splenic mass and retroperitoneal adenopathy along with patchy bilateral airspace disease consistent with viral pna.  COVID-19 testing was positive as was RPR and HIV testing (awaiting confirmatory tests).  He is a/o to self only. He was last weighed on 1/15 at which time he was the same weight as admission (12/31).   Diet changed from Dysphagia 3, nectar thick liquids to NPO on 1/15. Per flow sheet documentation, he ate 5% of dinner on 1/13. NGT was removed on 1/15 and PEG was placed later that day.   Order currently in place for Osmolite 1.5 @ 20 ml/hr. This regimen provides 720 kcal, 30 grams protein, and 366 ml free water. Will adjust TF regimen as outlined above.   Per notes: - COVID-19 PNA--s/p 5 days remdesivir - acute metabolic encephalopathy superimposed on advanced dementia--acute issue improving - possible neurosyphilis--started on continue penicillin  infusion x14 days (ends 1/22) - large cell B-cell non-Hodgkin's lymphoma - hyponatremia  - debility and deconditioning - moderate protein calorie malnutrition s/p PEG placement  - remains Full Code    Labs reviewed; CBGs: 108, 112 mg/dl, Na: 128 mmol/l, Cl: 97 mmol/l, creatinine: 0.43 mg/dl, Ca: 8.4 mg/dl. Medications reviewed; 500 mg ascorbic acid/day, daily multivitamin with minerals, 1 packet miralax/day, 1 tablet senokot BID, 220 mg zinc sulfate/day.      NUTRITION - FOCUSED PHYSICAL EXAM:  unable to complet for COVID+ patient.  Diet Order:   Diet Order            Diet NPO time specified Except for: Sips with Meds  Diet effective midnight              EDUCATION NEEDS:   No education needs have been identified at this time  Skin:  Skin Assessment: Skin Integrity Issues: Skin Integrity Issues:: Stage II Stage II: coccyx (new documentation 1/18)  Last BM:  1/20  Height:   Ht Readings from Last 1 Encounters:  02/22/19 '5\' 6"'$  (1.676 m)    Weight:   Wt Readings from Last 1 Encounters:  03/09/19 71.7 kg    Ideal Body Weight:  64.5 kg  BMI:  Body mass index is 25.5 kg/m.  Estimated Nutritional Needs:   Kcal:  1790-2000 kcal  Protein:  80-90 grams  Fluid:  >/= 2 L/day     Jarome Matin, MS, RD, LDN, CNSC Inpatient Clinical Dietitian Pager # [phone] After hours/weekend pager #  [phone]

## 2019-03-14 NOTE — Progress Notes (Signed)
Small amount of bleeding continues around peg tube. dsg changed and abd binder applied. Xray completed, notified on call hospitalist for clarification on resuming osmolite

## 2019-03-14 NOTE — Plan of Care (Signed)
  Problem: Clinical Measurements: Goal: Respiratory complications will improve Outcome: Progressing   Problem: Coping: Goal: Level of anxiety will decrease Outcome: Progressing   

## 2019-03-14 NOTE — Progress Notes (Signed)
Physical Therapy Treatment Patient Details Name: [name] MRN: [id number] DOB: [birth year] Today's Date: 03/14/2019    History of Present Illness Pt admitted from home Covid + and with hx of dementia.  Pt now additionally being treated for non-Hodgkin's lymphoma, encephalopathy, and possible neurosyphilis.  Pt now with NG feeding tube and per notes to get PEG tube tomorrow.    PT Comments    Pt continues to require max-total +2 assist for mobility tasks, and is unable to maintain static sitting or standing without external PT support. Pt with heavy posterior leaning in sitting, PT working on sitting balance with use of UE propping. Pt with x2 semi-stands this session before requesting return to supine due to fatigue, unable to come to full upright due to LE weakness and fatigue. SpO2 86-94% on RA during session, recovers from high 80s back to 90s with rest from activity. PT to continue to follow acutely, SNF continues to be the safest d/c option at this time.    Follow Up Recommendations  SNF     Equipment Recommendations  None recommended by PT    Recommendations for Other Services       Precautions / Restrictions Precautions Precautions: Fall Precaution Comments: NG feeding tube Restrictions Weight Bearing Restrictions: No    Mobility  Bed Mobility Overal bed mobility: Needs Assistance Bed Mobility: Rolling;Supine to Sit;Sit to Supine Rolling: Mod assist   Supine to sit: +2 for physical assistance;+2 for safety/equipment;Max assist Sit to supine: +2 for physical assistance;+2 for safety/equipment;Max assist   General bed mobility comments: max assist +2 for trunk elevation/lowering, walking LEs to and from EOB with verbal and tactile cuing for seqencing.  Transfers Overall transfer level: Needs assistance Equipment used: 2 person hand held assist Transfers: Sit to/from Stand Sit to Stand: Total assist;+2 physical assistance;+2 safety/equipment         General  transfer comment: Total +2 for power up, steadying, hip extension, posture. Pt unable to come to full upright, even with total assist and bilateral LE blocking. Pt soiled in urine, semi-stand x2 to exchange bed pads.  Ambulation/Gait             General Gait Details: unable   Stairs             Wheelchair Mobility    Modified Rankin (Stroke Patients Only)       Balance Overall balance assessment: Needs assistance Sitting-balance support: Bilateral upper extremity supported;Feet supported Sitting balance-Leahy Scale: Fair Sitting balance - Comments: initially heavy posterior leaning, requiring PT assist to correct. PT encouraged pt to prop on UEs to maintain sitting Postural control: Posterior lean Standing balance support: Bilateral upper extremity supported;During functional activity Standing balance-Leahy Scale: Zero Standing balance comment: dependent on external assist from staff                            Cognition Arousal/Alertness: Awake/alert Behavior During Therapy: WFL for tasks assessed/performed Overall Cognitive Status: History of cognitive impairments - at baseline                                 General Comments: pt with history of dementia, follows one step commands with multimodal cuing and answers yes/no questions appropriately      Exercises General Exercises - Lower Extremity Ankle Circles/Pumps: PROM;AAROM;Both;15 reps;Supine    General Comments        Pertinent  Vitals/Pain Pain Assessment: Faces Faces Pain Scale: No hurt Pain Intervention(s): Monitored during session    Home Living                      Prior Function            PT Goals (current goals can now be found in the care plan section) Acute Rehab PT Goals Patient Stated Goal: none stated PT Goal Formulation: Patient unable to participate in goal setting Time For Goal Achievement: 03/26/19 Potential to Achieve Goals: Fair Progress  towards PT goals: Not progressing toward goals - comment(max-total assist +2)    Frequency    Min 2X/week      PT Plan Current plan remains appropriate    Co-evaluation              AM-PAC PT "6 Clicks" Mobility   Outcome Measure  Help needed turning from your back to your side while in a flat bed without using bedrails?: Total Help needed moving from lying on your back to sitting on the side of a flat bed without using bedrails?: Total Help needed moving to and from a bed to a chair (including a wheelchair)?: Total Help needed standing up from a chair using your arms (e.g., wheelchair or bedside chair)?: Total Help needed to walk in hospital room?: Total Help needed climbing 3-5 steps with a railing? : Total 6 Click Score: 6    End of Session Equipment Utilized During Treatment: Gait belt Activity Tolerance: Patient limited by fatigue Patient left: in bed;with call bell/phone within reach(bed alarm off upon PT arrival to room, appears to be malfunctioning. fall pads placed, rails up) Nurse Communication: Mobility status;Other (comment)(external catheter needs to be replaced) PT Visit Diagnosis: Muscle weakness (generalized) (M62.81);Difficulty in walking, not elsewhere classified (R26.2)     Time: [id number] PT Time Calculation (min) (ACUTE ONLY): 18 min  Charges:  $Neuromuscular Re-education: 8-22 mins                     Acen Craun E, PT Acute Rehabilitation Services Pager [phone]  Office [phone]   [place] Asberry D Ondria Oswald 03/14/2019, 11:35 AM

## 2019-03-15 LAB — CBC
HCT: 36.5 % — ABNORMAL LOW (ref 39.0–52.0)
Hemoglobin: 12.2 g/dL — ABNORMAL LOW (ref 13.0–17.0)
MCH: 31.7 pg (ref 26.0–34.0)
MCHC: 33.4 g/dL (ref 30.0–36.0)
MCV: 94.8 fL (ref 80.0–100.0)
Platelets: 235 10*3/uL (ref 150–400)
RBC: 3.85 MIL/uL — ABNORMAL LOW (ref 4.22–5.81)
RDW: 14.4 % (ref 11.5–15.5)
WBC: 15.7 10*3/uL — ABNORMAL HIGH (ref 4.0–10.5)
nRBC: 0 % (ref 0.0–0.2)

## 2019-03-15 LAB — GLUCOSE, CAPILLARY
Glucose-Capillary: 102 mg/dL — ABNORMAL HIGH (ref 70–99)
Glucose-Capillary: 103 mg/dL — ABNORMAL HIGH (ref 70–99)
Glucose-Capillary: 112 mg/dL — ABNORMAL HIGH (ref 70–99)
Glucose-Capillary: 115 mg/dL — ABNORMAL HIGH (ref 70–99)
Glucose-Capillary: 127 mg/dL — ABNORMAL HIGH (ref 70–99)
Glucose-Capillary: 87 mg/dL (ref 70–99)

## 2019-03-15 LAB — BASIC METABOLIC PANEL
Anion gap: 11 (ref 5–15)
BUN: 15 mg/dL (ref 8–23)
CO2: 21 mmol/L — ABNORMAL LOW (ref 22–32)
Calcium: 8.4 mg/dL — ABNORMAL LOW (ref 8.9–10.3)
Chloride: 99 mmol/L (ref 98–111)
Creatinine, Ser: 0.3 mg/dL — ABNORMAL LOW (ref 0.61–1.24)
GFR calc Af Amer: >60 mL/min (ref >60)
GFR calc non Af Amer: >60 mL/min (ref >60)
Glucose, Bld: 126 mg/dL — ABNORMAL HIGH (ref 70–99)
Potassium: 3.9 mmol/L (ref 3.5–5.1)
Sodium: 131 mmol/L — ABNORMAL LOW (ref 135–145)

## 2019-03-15 LAB — SURGICAL PATHOLOGY

## 2019-03-15 MED ORDER — LIP MEDEX EX OINT
TOPICAL_OINTMENT | CUTANEOUS | Status: AC
  Filled 2019-03-15: qty 7

## 2019-03-15 MED ORDER — METOPROLOL TARTRATE 5 MG/5ML IV SOLN
2.5000 mg | Freq: Once | INTRAVENOUS | Status: AC
  Administered 2019-03-15: 2.5 mg via INTRAVENOUS
  Filled 2019-03-15: qty 5

## 2019-03-15 MED ORDER — ENOXAPARIN SODIUM 40 MG/0.4ML ~~LOC~~ SOLN
40.0000 mg | Freq: Every day | SUBCUTANEOUS | Status: DC
  Administered 2019-03-15 – 2019-03-16 (×2): 40 mg via SUBCUTANEOUS
  Filled 2019-03-15 (×2): qty 0.4

## 2019-03-15 NOTE — Progress Notes (Signed)
Verbal order by NP Bodenheimer to hold tube feedings for two hours and give IV Lopressor and monitor patient.

## 2019-03-15 NOTE — TOC Progression Note (Signed)
Transition of Care [facility]) - Progression Note    Patient Details  Name: [name] MRN: [id number] Date of Birth: 1928-06-05  Transition of Care [facility]) CM/SW Contact  Ross Ludwig, [place] Phone Number: 03/15/2019, 4:54 PM  Clinical Narrative:     CSW spoke with patient's son [name] to present bed offers, patient's son will review options and call CSW back.  Patient's son is requesting [facility] or [facility], CSW informed him that they are still pending offers.  CSW to follow up in the morning with choices.     Barriers to Discharge: Continued Medical Work up, Other (comment)  Expected Discharge Plan and Services                                                 Social Determinants of Health (SDOH) Interventions    Readmission Risk Interventions No flowsheet data found.

## 2019-03-15 NOTE — Progress Notes (Signed)
Physical Therapy Treatment Patient Details Name: [name] MRN: [id number] DOB: 1928-10-13 Today's Date: 03/15/2019    History of Present Illness Pt admitted from home Covid + and with hx of dementia.  Pt now additionally being treated for non-Hodgkin's lymphoma, encephalopathy, and possible neurosyphilis.  Pt now with NG feeding tube and per notes to get PEG tube tomorrow.    PT Comments    Pt more alert this session, followed one-step commands, and conversed with PT during session. Pt still requires max assist for bed mobility and mod assist to sit EOB, but sustained sitting balance activity for longer this session. Pt unable to stand this session due to fatigue from 10 minutes sitting EOB. PT continuing to recommend SNF level of care, will continue to follow acutely.    Follow Up Recommendations  SNF     Equipment Recommendations  None recommended by PT    Recommendations for Other Services       Precautions / Restrictions Precautions Precautions: Fall Precaution Comments: NG feeding tube Restrictions Weight Bearing Restrictions: No    Mobility  Bed Mobility Overal bed mobility: Needs Assistance Bed Mobility: Supine to Sit;Sit to Supine;Rolling Rolling: Max assist   Supine to sit: Max assist Sit to supine: Max assist   General bed mobility comments: Max assist for roll to R for placement of pillows for positioning post-session. Max assist for supine<>sit for trunk and LE management, pt with initiation of LEs to EOB.  Transfers                 General transfer comment: nt  Ambulation/Gait             General Gait Details: unable   Stairs             Wheelchair Mobility    Modified Rankin (Stroke Patients Only)       Balance Overall balance assessment: Needs assistance Sitting-balance support: Bilateral upper extremity supported;Feet supported Sitting balance-Leahy Scale: Poor Sitting balance - Comments: R lateral and posterior  leaning, requiring mod assist from PT to correct. EOB sitting ~10 minutes Postural control: Posterior lean;Right lateral lean   Standing balance-Leahy Scale: Zero                              Cognition Arousal/Alertness: Awake/alert Behavior During Therapy: WFL for tasks assessed/performed Overall Cognitive Status: History of cognitive impairments - at baseline                                 General Comments: pt with history of dementia, pleasantly confused and telling jokes during PT session      Exercises General Exercises - Lower Extremity Heel Slides: AAROM;Both;10 reps;Supine    General Comments General comments (skin integrity, edema, etc.): vss      Pertinent Vitals/Pain Pain Assessment: Faces Faces Pain Scale: No hurt Pain Intervention(s): Monitored during session    Home Living                      Prior Function            PT Goals (current goals can now be found in the care plan section) Acute Rehab PT Goals Patient Stated Goal: none stated PT Goal Formulation: Patient unable to participate in goal setting Time For Goal Achievement: 03/26/19 Potential to Achieve Goals: Fair Progress towards PT goals:  Progressing toward goals    Frequency    Min 2X/week      PT Plan Current plan remains appropriate    Co-evaluation              AM-PAC PT "6 Clicks" Mobility   Outcome Measure  Help needed turning from your back to your side while in a flat bed without using bedrails?: Total Help needed moving from lying on your back to sitting on the side of a flat bed without using bedrails?: Total Help needed moving to and from a bed to a chair (including a wheelchair)?: Total Help needed standing up from a chair using your arms (e.g., wheelchair or bedside chair)?: Total Help needed to walk in hospital room?: Total Help needed climbing 3-5 steps with a railing? : Total 6 Click Score: 6    End of Session   Activity  Tolerance: Patient tolerated treatment well Patient left: in bed;with call bell/phone within reach(bed alarm off upon PT arrival to room, appears to be malfunctioning. fall pads placed, rails up) Nurse Communication: Mobility status PT Visit Diagnosis: Muscle weakness (generalized) (M62.81);Difficulty in walking, not elsewhere classified (R26.2)     Time: [id number] PT Time Calculation (min) (ACUTE ONLY): 24 min  Charges:  $Therapeutic Activity: 8-22 mins $Neuromuscular Re-education: 8-22 mins                     Zakyra Kukuk E, PT Acute Rehabilitation Services Pager [phone]  Office [phone]  Flem Enderle D Elonda Husky 03/15/2019, 5:23 PM

## 2019-03-15 NOTE — Progress Notes (Signed)
Bleeding at peg site has pretty much stopped. dsg still in place under abd binder

## 2019-03-15 NOTE — Progress Notes (Signed)
Patient currently sleeping, heart rate range 117-121 BPM. Patient has a hx of bleeding.  Peg tube site assessed, no visible bleeding noted.  Tube feeding stopped and charge nurse notified hospitalist  Bodenheimer. Mews protocol started.

## 2019-03-15 NOTE — Progress Notes (Signed)
PROGRESS NOTE  [name] [id number] DOB: 05/25/1928   PCP: [name], MD  Patient is from: Home  DOA: 02/22/2019 LOS: 66  Brief Narrative / Interim history:  86 y.o. male with a PMH of advanced dementia, hypertension, hyperlipidemia, GERD and BPH who was admitted 02/22/2019 with a chief complaint of generalized weakness progressive over several weeks.  In the ED, he had low-grade temperature but was noted to be hypoxic with a room air saturation of 84% associated with tachycardia.  Chest x-ray was clear and CT was negative for acute infarct.  CT of the abdomen and pelvis showed a splenic mass and retroperitoneal adenopathy along with patchy bilateral airspace disease consistent with viral pneumonia.  COVID-19 testing was positive as was RPR and HIV testing.  T. Pallidum positive. However, RPR titer 1:1. Unclear if this was treated or untreated.  Confirmatory HIV test negative.  Infectious disease, IR and oncology consulted.  He underwent lumbar puncture on 02/28/2018.  Cytology not indicated for specific etiology.  CSF protein elevated to 100.  Gram stain and cultures negative so far.  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  Patient had biopsy of supraclavicular lymph nodes on 02/27/2018.  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  Oncology discussed about treatment options.  Plan is to wait until patient improves clinically.   IR to place PEG tube.  Tolerating tube feeds.  Pending placement  Subjective: There was some concern of bleeding around the PEG tube which has since stopped with pressure.  TPs were held temporarily and absent been resumed. Awake.  Trying to communicate.  Unable to find hearing aids at bedside.  Objective: Vitals:   03/15/19 1200 03/15/19 1325 03/15/19 1600 03/15/19 1635  BP:  118/66    Pulse: (!) 107 98 (!) 112 87  Resp:  16    Temp:  98.2 F (36.8  C)    TempSrc:  Oral    SpO2:  98%    Weight:      Height:        Intake/Output Summary (Last 24 hours) at 03/15/2019 1719 Last data filed at 03/15/2019 1000 Gross per 24 hour  Intake 1160.83 ml  Output 400 ml  Net 760.83 ml   Filed Weights   03/05/19 0500 03/06/19 0415 03/09/19 0610  Weight: 68.5 kg 67.6 kg 71.7 kg    Examination:  GENERAL: Chronically ill-appearing.  No apparent distress. HEENT: MMM.  Vision grossly intact.  Impaired hearing. NECK: Supple.  No apparent JVD.  RESP: On RA.  No IWOB.  Fair aeration bilaterally. CVS:  RRR. Heart sounds normal.  ABD/GI/GU: Bowel sounds present. Soft. Non tender.  PEG site tube no bleeding MSK/EXT:  Moves extremities. No apparent deformity. No edema.  SKIN: no apparent skin lesion or wound NEURO: Awake, attempts to speak but conversational difficult due to hearing impairment (unable to find hearing aids).  No apparent focal neuro deficit. PSYCH: Calm.  No signs of agitation.  Procedures:  1/9-PICC line placement 1/15-G-tube placement by IR.  Assessment & Plan: Pneumonia due to COVID-19 infection: CT chest with bibasilar groundglass opacities.  CRP normalized.  D-dimer remains elevated.  No respiratory distress.  Remained stable on room air. Completed 5 days of remdesivir, Decadron -Subcu Lovenox for VTE prophylaxis.  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  Ammonia 36>14.  Thiamine and vitamin B12 normal.  COVID-19 and acute hospitalization could  contribute.   Improving.  This may be his baseline given underlying advanced dementia. -Continue low-dose Seroquel at bedtime  -Delirium precautions -Bowel regimen with lactulose as needed -Treating-tertiary syphilis as below  Possible neurosyphilis: RPR and T. Pallidum positive although low titer.  CSF protein elevated to 100.  Cytology not indicated for specific etiology.  CSF Gram stain and cultures negative so far.  CSF  VDRL negative but this does not exclude per ID. -ID started continuous penicillin G infusion for a total of 14 days (end date 03/16/2019).  PICC line placed on 1/9.  Large-cell B-cell non-Hodgkin's lymphoma: Confirmed by supraclavicular lymph node biopsy on 1/6. Splenic mass/retroperitoneal right healer and left supraclavicular adenopathy likely due to lymphoma -Oncology discussed treatment options with family 1/13.  Plan is to wait until he improves from other medical condition and really address this outpatient. -Oncology wanted to maintain PICC line after completion of IV penicillin for potential chemotherapy.  Hyponatremia Sodium 126.  Chloride 94.  Likely dilutional since he is receiving free water flushes with his tube feeds. Changed flushes with tube feeds to normal saline with minimal improvement in sodium. Switch diluent of penicillin G from D5 to normal saline Improving  Hypertension: Normotensive. -Continue holding Avapro.  Glaucoma -Continue home eyedrops.  BPH: Not on medication at home. -Monitor urinary output.  Leukocytosis/bandemia: No obvious source of infection on exam.  No fever.  Not on steroids. - Afebrile.  Will hold off on further infectious work-up at this time.  Physical deconditioning/debility: -PT/OT.  Goal of care: Patient remains full code per family and they would want to continue routine care at this time.  Palliative care following.  I have discussed with patient's son more than once regarding his overall poor prognosis given age, medical comorbidities including lymphoma and complexity of medical problems. -  G-tube placed 1/15.    Moderate protein calorie malnutrition Tolerating G-tube feeds Nutrition Problem: Increased nutrient needs Etiology: acute illness(COVID-19)  Signs/Symptoms: estimated needs  Interventions: Tube feeding   DVT prophylaxis: Subcu Lovenox Code Status: Full code Family Communication:  Disposition Plan: Screening  COVID-19 test ordered for placement.  Declined by [facility] SNF with concern that patient may not do well with rehab and potentially a long-term care candidate.  Case management working on finding a SNF for short-term rehab.  I have discussed with patient's son that if he is not accepted by SNF for short-term rehab, we will have to consider goals of care again versus long-term placement. Consultants: IR (off), ID (off), oncology, palliative care    Microbiology summarized: [id number] positive. Urine culture with multiple species.  Sch Meds:  Scheduled Meds: . vitamin C  500 mg Oral Daily  . aspirin EC  81 mg Oral Daily  . atorvastatin  5 mg Oral Daily  . brinzolamide  1 drop Both Eyes TID  . Chlorhexidine Gluconate Cloth  6 each Topical Daily  . Chlorhexidine Gluconate Cloth  6 each Topical Daily  . donepezil  10 mg Oral QHS  . enoxaparin (LOVENOX) injection  40 mg Subcutaneous Daily  . latanoprost  1 drop Both Eyes QHS  . multivitamin with minerals  1 tablet Oral Daily  . polyethylene glycol  17 g Oral Daily  . QUEtiapine  25 mg Oral QHS  . senna-docusate  1 tablet Oral BID  . zinc sulfate  220 mg Oral Daily   Continuous Infusions: . sodium chloride 10 mL/hr at 03/15/19 0909  . feeding supplement (OSMOLITE 1.2 CAL) 60 mL/hr at  03/15/19 1000  . penicillin g continuous IV infusion 12 Million Units (03/15/19 0552)   PRN Meds:.lactulose **OR** lactulose, sodium chloride flush  Antimicrobials: Anti-infectives (From admission, onward)   Start     Dose/Rate Route Frequency Ordered Stop   03/13/19 1800  penicillin G potassium 12 Million Units in sodium chloride 0.9 % 500 mL continuous infusion     12 Million Units 41.7 mL/hr over 12 Hours Intravenous Every 12 hours 03/13/19 0816 03/16/19 1759   03/02/19 1800  penicillin G potassium 12 Million Units in dextrose 5 % 500 mL continuous infusion  Status:  Discontinued     12 Million Units 41.7 mL/hr over 12 Hours Intravenous Every 12  hours 03/02/19 1514 03/13/19 0818   02/24/19 1000  remdesivir 100 mg in sodium chloride 0.9 % 100 mL IVPB     100 mg 200 mL/hr over 30 Minutes Intravenous Daily 02/23/19 1519 02/27/19 1120   02/23/19 1600  cefTRIAXone (ROCEPHIN) 1 g in sodium chloride 0.9 % 100 mL IVPB  Status:  Discontinued     1 g 200 mL/hr over 30 Minutes Intravenous Every 24 hours 02/22/19 1751 02/23/19 0818   02/23/19 1600  remdesivir 200 mg in sodium chloride 0.9% 250 mL IVPB     200 mg 580 mL/hr over 30 Minutes Intravenous Once 02/23/19 1519 02/23/19 1624   02/23/19 1500  azithromycin (ZITHROMAX) 500 mg in sodium chloride 0.9 % 250 mL IVPB  Status:  Discontinued     500 mg 250 mL/hr over 60 Minutes Intravenous Every 24 hours 02/22/19 1751 02/23/19 0818   02/22/19 1630  cefTRIAXone (ROCEPHIN) 1 g in sodium chloride 0.9 % 100 mL IVPB     1 g 200 mL/hr over 30 Minutes Intravenous  Once 02/22/19 1615 02/22/19 1729   02/22/19 1630  azithromycin (ZITHROMAX) 500 mg in sodium chloride 0.9 % 250 mL IVPB     500 mg 250 mL/hr over 60 Minutes Intravenous  Once 02/22/19 1615 02/22/19 1902       I have personally reviewed the following labs and images: CBC: Recent Labs  Lab 03/10/19 0303 03/11/19 0500 03/12/19 0532 03/15/19 0438  WBC 20.4* 11.7* 12.1* 15.7*  NEUTROABS 15.7*  --   --   --   HGB 12.9* 11.8* 12.0* 12.2*  HCT 38.2* 35.5* 35.8* 36.5*  MCV 93.9 93.7 93.5 94.8  PLT 230 219 228 235   BMP &GFR Recent Labs  Lab 03/09/19 0500 03/09/19 0500 03/10/19 0303 03/10/19 0303 03/11/19 0500 03/12/19 0532 03/13/19 0449 03/14/19 0500 03/15/19 0438  NA 130*   < > 131*   < > 128* 126* 127* 128* 131*  K 4.3   < > 4.2   < > 3.9 4.0 3.9 3.8 3.9  CL 96*   < > 98   < > 95* 94* 95* 97* 99  CO2 25   < > 22   < > 26 24 24 24  21*  GLUCOSE 128*   < > 103*   < > 110* 121* 106* 119* 126*  BUN 22   < > 23   < > 20 17 14 14 15   CREATININE 0.56*   < > 0.64   < > 0.38* 0.44* 0.44* 0.43* 0.30*  CALCIUM 8.6*   < > 8.5*   < >  8.2* 8.3* 8.3* 8.4* 8.4*  MG 2.4  --  2.2  --  2.1 2.0  --   --   --   PHOS 3.3  --  3.2  --  2.6 2.6  --   --   --    < > = values in this interval not displayed.   Estimated Creatinine Clearance: 57.6 mL/min (A) (by C-G formula based on SCr of 0.3 mg/dL (L)). Liver & Pancreas: No results for input(s): AST, ALT, ALKPHOS, BILITOT, PROT, ALBUMIN in the last 168 hours. No results for input(s): LIPASE, AMYLASE in the last 168 hours. No results for input(s): AMMONIA in the last 168 hours. Diabetic: No results for input(s): HGBA1C in the last 72 hours. Recent Labs  Lab 03/14/19 2348 03/15/19 0557 03/15/19 0735 03/15/19 1151 03/15/19 1705  GLUCAP 104* 102* 115* 112* 87   Cardiac Enzymes: No results for input(s): CKTOTAL, CKMB, CKMBINDEX, TROPONINI in the last 168 hours. No results for input(s): PROBNP in the last 8760 hours. Coagulation Profile: No results for input(s): INR, PROTIME in the last 168 hours. Thyroid Function Tests: No results for input(s): TSH, T4TOTAL, FREET4, T3FREE, THYROIDAB in the last 72 hours. Lipid Profile: No results for input(s): CHOL, HDL, LDLCALC, TRIG, CHOLHDL, LDLDIRECT in the last 72 hours. Anemia Panel: No results for input(s): VITAMINB12, FOLATE, FERRITIN, TIBC, IRON, RETICCTPCT in the last 72 hours. Urine analysis:    Component Value Date/Time   COLORURINE YELLOW 02/22/2019 1226   APPEARANCEUR CLEAR 02/22/2019 1226   LABSPEC 1.019 02/22/2019 1226   PHURINE 7.0 02/22/2019 1226   GLUCOSEU NEGATIVE 02/22/2019 1226   HGBUR NEGATIVE 02/22/2019 1226   BILIRUBINUR NEGATIVE 02/22/2019 1226   BILIRUBINUR negative 02/05/2019 1610   KETONESUR NEGATIVE 02/22/2019 1226   PROTEINUR NEGATIVE 02/22/2019 1226   UROBILINOGEN 0.2 02/05/2019 1610   UROBILINOGEN 0.2 03/21/2007 0944   NITRITE NEGATIVE 02/22/2019 1226   LEUKOCYTESUR NEGATIVE 02/22/2019 1226   Sepsis Labs: Invalid input(s): PROCALCITONIN, [place]  Microbiology: Recent Results (from the past  240 hour(s))  SARS CORONAVIRUS 2 (TAT 6-24 HRS) Nasopharyngeal Nasopharyngeal Swab     Status: None   Collection Time: 03/12/19 12:40 PM   Specimen: Nasopharyngeal Swab  Result Value Ref Range Status   SARS Coronavirus 2 NEGATIVE NEGATIVE Final    Comment: (NOTE) SARS-CoV-2 target nucleic acids are NOT DETECTED. The SARS-CoV-2 RNA is generally detectable in upper and lower respiratory specimens during the acute phase of infection. Negative results do not preclude SARS-CoV-2 infection, do not rule out co-infections with other pathogens, and should not be used as the sole basis for treatment or other patient management decisions. Negative results must be combined with clinical observations, patient history, and epidemiological information. The expected result is Negative. Fact Sheet for Patients: SugarRoll.be Fact Sheet for Healthcare Providers: https://www.woods-mathews.com/ This test is not yet approved or cleared by the [place] FDA and  has been authorized for detection and/or diagnosis of SARS-CoV-2 by FDA under an Emergency Use Authorization (EUA). This EUA will remain  in effect (meaning this test can be used) for the duration of the COVID-19 declaration under Section 56 4(b)(1) of the Act, 21 U.S.C. section 360bbb-3(b)(1), unless the authorization is terminated or revoked sooner. Performed at [facility], [place] [address] [place], [place] [address]     Radiology Studies: DG Abd 1 View  Result Date: 03/14/2019 CLINICAL DATA:  86 year old male status post NG tube placement. EXAM: ABDOMEN - 1 VIEW COMPARISON:  Chest radiograph dated 03/04/2019. FINDINGS: There has been interval removal of the previously seen feeding tube and placement of a percutaneous gastrostomy with balloon over the stomach. Partially visualized right-sided PICC with tip in the region of  the cavoatrial junction. There is no bowel dilatation. Air is noted  within the colon. A 6 mm radiopaque focus to the right of the spine likely corresponds to the vascular calcification of the right renal artery seen on the prior CT. Atherosclerotic calcification of the aorta. Osteopenia with degenerative changes of the spine. No acute osseous pathology. IMPRESSION: Percutaneous gastrostomy with balloon over the stomach. Electronically Signed   By: Anner Crete M.D.   On: 03/14/2019 20:00    Budd Palmer, MD Internal Medicine  Hospitalist    If 7PM-7AM, please contact night-coverage [email or web address] Password [facility] 03/15/2019, 5:19 PM

## 2019-03-15 NOTE — Plan of Care (Signed)

## 2019-03-16 DIAGNOSIS — R64 Cachexia: Secondary | ICD-10-CM | POA: Diagnosis present

## 2019-03-16 DIAGNOSIS — Z7401 Bed confinement status: Secondary | ICD-10-CM | POA: Diagnosis not present

## 2019-03-16 DIAGNOSIS — Z79899 Other long term (current) drug therapy: Secondary | ICD-10-CM | POA: Diagnosis not present

## 2019-03-16 DIAGNOSIS — U071 COVID-19: Secondary | ICD-10-CM | POA: Diagnosis not present

## 2019-03-16 DIAGNOSIS — K219 Gastro-esophageal reflux disease without esophagitis: Secondary | ICD-10-CM | POA: Diagnosis present

## 2019-03-16 DIAGNOSIS — Z833 Family history of diabetes mellitus: Secondary | ICD-10-CM | POA: Diagnosis not present

## 2019-03-16 DIAGNOSIS — I10 Essential (primary) hypertension: Secondary | ICD-10-CM | POA: Diagnosis present

## 2019-03-16 DIAGNOSIS — R531 Weakness: Secondary | ICD-10-CM | POA: Diagnosis not present

## 2019-03-16 DIAGNOSIS — A539 Syphilis, unspecified: Secondary | ICD-10-CM | POA: Diagnosis not present

## 2019-03-16 DIAGNOSIS — C8330 Diffuse large B-cell lymphoma, unspecified site: Secondary | ICD-10-CM | POA: Diagnosis present

## 2019-03-16 DIAGNOSIS — Z8249 Family history of ischemic heart disease and other diseases of the circulatory system: Secondary | ICD-10-CM | POA: Diagnosis not present

## 2019-03-16 DIAGNOSIS — M6259 Muscle wasting and atrophy, not elsewhere classified, multiple sites: Secondary | ICD-10-CM | POA: Diagnosis not present

## 2019-03-16 DIAGNOSIS — E119 Type 2 diabetes mellitus without complications: Secondary | ICD-10-CM | POA: Diagnosis not present

## 2019-03-16 DIAGNOSIS — R0689 Other abnormalities of breathing: Secondary | ICD-10-CM | POA: Diagnosis not present

## 2019-03-16 DIAGNOSIS — N400 Enlarged prostate without lower urinary tract symptoms: Secondary | ICD-10-CM | POA: Diagnosis present

## 2019-03-16 DIAGNOSIS — L89154 Pressure ulcer of sacral region, stage 4: Secondary | ICD-10-CM | POA: Diagnosis present

## 2019-03-16 DIAGNOSIS — G9341 Metabolic encephalopathy: Secondary | ICD-10-CM | POA: Diagnosis present

## 2019-03-16 DIAGNOSIS — Z806 Family history of leukemia: Secondary | ICD-10-CM | POA: Diagnosis not present

## 2019-03-16 DIAGNOSIS — J189 Pneumonia, unspecified organism: Secondary | ICD-10-CM | POA: Diagnosis not present

## 2019-03-16 DIAGNOSIS — R6510 Systemic inflammatory response syndrome (SIRS) of non-infectious origin without acute organ dysfunction: Secondary | ICD-10-CM | POA: Diagnosis not present

## 2019-03-16 DIAGNOSIS — Z515 Encounter for palliative care: Secondary | ICD-10-CM | POA: Diagnosis not present

## 2019-03-16 DIAGNOSIS — Z87891 Personal history of nicotine dependence: Secondary | ICD-10-CM | POA: Diagnosis not present

## 2019-03-16 DIAGNOSIS — Z8616 Personal history of COVID-19: Secondary | ICD-10-CM | POA: Diagnosis not present

## 2019-03-16 DIAGNOSIS — H409 Unspecified glaucoma: Secondary | ICD-10-CM | POA: Diagnosis present

## 2019-03-16 DIAGNOSIS — Z66 Do not resuscitate: Secondary | ICD-10-CM | POA: Diagnosis not present

## 2019-03-16 DIAGNOSIS — N401 Enlarged prostate with lower urinary tract symptoms: Secondary | ICD-10-CM | POA: Diagnosis not present

## 2019-03-16 DIAGNOSIS — G311 Senile degeneration of brain, not elsewhere classified: Secondary | ICD-10-CM | POA: Diagnosis not present

## 2019-03-16 DIAGNOSIS — R0603 Acute respiratory distress: Secondary | ICD-10-CM | POA: Diagnosis not present

## 2019-03-16 DIAGNOSIS — R6520 Severe sepsis without septic shock: Secondary | ICD-10-CM | POA: Diagnosis not present

## 2019-03-16 DIAGNOSIS — E785 Hyperlipidemia, unspecified: Secondary | ICD-10-CM | POA: Diagnosis present

## 2019-03-16 DIAGNOSIS — R0902 Hypoxemia: Secondary | ICD-10-CM | POA: Diagnosis not present

## 2019-03-16 DIAGNOSIS — R0602 Shortness of breath: Secondary | ICD-10-CM | POA: Diagnosis not present

## 2019-03-16 DIAGNOSIS — H9190 Unspecified hearing loss, unspecified ear: Secondary | ICD-10-CM | POA: Diagnosis present

## 2019-03-16 DIAGNOSIS — Z931 Gastrostomy status: Secondary | ICD-10-CM | POA: Diagnosis not present

## 2019-03-16 DIAGNOSIS — A419 Sepsis, unspecified organism: Secondary | ICD-10-CM | POA: Diagnosis present

## 2019-03-16 DIAGNOSIS — J1282 Pneumonia due to coronavirus disease 2019: Secondary | ICD-10-CM | POA: Diagnosis not present

## 2019-03-16 DIAGNOSIS — R000 Tachycardia, unspecified: Secondary | ICD-10-CM | POA: Diagnosis not present

## 2019-03-16 DIAGNOSIS — R404 Transient alteration of awareness: Secondary | ICD-10-CM | POA: Diagnosis not present

## 2019-03-16 DIAGNOSIS — M2550 Pain in unspecified joint: Secondary | ICD-10-CM | POA: Diagnosis not present

## 2019-03-16 DIAGNOSIS — F0390 Unspecified dementia without behavioral disturbance: Secondary | ICD-10-CM | POA: Diagnosis present

## 2019-03-16 DIAGNOSIS — Z803 Family history of malignant neoplasm of breast: Secondary | ICD-10-CM | POA: Diagnosis not present

## 2019-03-16 DIAGNOSIS — J690 Pneumonitis due to inhalation of food and vomit: Secondary | ICD-10-CM | POA: Diagnosis present

## 2019-03-16 DIAGNOSIS — B20 Human immunodeficiency virus [HIV] disease: Secondary | ICD-10-CM | POA: Diagnosis not present

## 2019-03-16 DIAGNOSIS — Z7982 Long term (current) use of aspirin: Secondary | ICD-10-CM | POA: Diagnosis not present

## 2019-03-16 DIAGNOSIS — E46 Unspecified protein-calorie malnutrition: Secondary | ICD-10-CM | POA: Diagnosis not present

## 2019-03-16 DIAGNOSIS — Z974 Presence of external hearing-aid: Secondary | ICD-10-CM | POA: Diagnosis not present

## 2019-03-16 DIAGNOSIS — Z7189 Other specified counseling: Secondary | ICD-10-CM | POA: Diagnosis not present

## 2019-03-16 LAB — GLUCOSE, CAPILLARY
Glucose-Capillary: 121 mg/dL — ABNORMAL HIGH (ref 70–99)
Glucose-Capillary: 121 mg/dL — ABNORMAL HIGH (ref 70–99)
Glucose-Capillary: 105 mg/dL — ABNORMAL HIGH (ref 70–99)
Glucose-Capillary: 100 mg/dL — ABNORMAL HIGH (ref 70–99)

## 2019-03-16 MED ORDER — QUETIAPINE FUMARATE 25 MG PO TABS: 25.0000 mg | ORAL_TABLET | Freq: Every day | ORAL | 0 refills | Status: DC

## 2019-03-16 MED ORDER — ADULT MULTIVITAMIN W/MINERALS CH: 1.0000 | ORAL_TABLET | Freq: Every day | ORAL | 0 refills | Status: DC

## 2019-03-16 MED ORDER — LACTULOSE 10 GM/15ML PO SOLN: 30.0000 g | Freq: Two times a day (BID) | ORAL | 0 refills | Status: DC | PRN

## 2019-03-16 MED ORDER — LACTULOSE ENEMA: 300.0000 mL | Freq: Two times a day (BID) | ORAL | 0 refills | Status: DC | PRN

## 2019-03-16 MED ORDER — SENNOSIDES-DOCUSATE SODIUM 8.6-50 MG PO TABS: 1.0000 | ORAL_TABLET | Freq: Two times a day (BID) | ORAL | 0 refills | Status: DC

## 2019-03-16 MED ORDER — ACETAMINOPHEN 500 MG PO TABS: 500.0000 mg | ORAL_TABLET | Freq: Three times a day (TID) | ORAL | 0 refills | Status: DC | PRN

## 2019-03-16 MED ORDER — OSMOLITE 1.2 CAL PO LIQD: 1000.0000 mL | ORAL | 3 refills | Status: DC

## 2019-03-16 MED ORDER — SODIUM CHLORIDE 0.9% FLUSH: 10.0000 mL | INTRAVENOUS | 0 refills | Status: DC | PRN

## 2019-03-16 NOTE — Plan of Care (Signed)

## 2019-03-16 NOTE — Discharge Summary (Addendum)
Physician Discharge Summary  [name] [id number] DOB: Jun 08, 1928 DOA: 02/22/2019  PCP: [name], MD  Admit date: 02/22/2019 Discharge date: 03/16/2019  Admitted From: Home Disposition:  SNF  Recommendations for Outpatient Follow-up:  1. He will need outpatient oncology follow-up at [facility] at the end of rehab to assess candidacy for treatment options for diffuse large B cell non-Hodgkin's lymphoma. 2. Recommended goal for tube feeds at 60 cc/h but he tends to get tachycardic at this goal and is better with a lower goal of 40 cc/h. 3. Recommend outpatient palliative care follow-up for ongoing goals of care and CODE STATUS discussion. 4. Discharged with PICC line in place per Oncology request as it could be used for chemotherapy in the future.   Home Health: No Equipment/Devices: None  Discharge Condition: Stable CODE STATUS: Full code Diet recommendation: Osmolite 1.2 at 40 cc/h (can increase by 10 cc every 4 to reach goal of 60 cc/h if tolerated but patient tends to get tachycardic) via PEG tube, recommend flushing with 200 cc NS every 6 (can increase frequency or change to free water if indicated).   NPO by mouth. Patient is okay to take medications crushed in apple sauce orally.    Brief/Interim Summary: 86 year old male with PMH of advanced dementia, hypertension, hyperlipidemia, GERD, BPH admitted on 02/22/2019 with chief complaint of generalized weakness and was found to be hypoxic saturating 84% on room air requiring oxygen which has since been weaned to room air.  He tested positive for COVID-19 and completed course of remdesivir and dexamethasone.  Chest x-ray was clear and CT head negative for acute infarct.  CT abdomen pelvis showed splenic mass and retroperitoneal adenopathy.  Biopsy of supraclavicular lymph nodes on 02/27/2018 showed large cell B-cell non-Hodgkin's lymphoma.  Oncology was consulted and treatment options provided to the family.   Palliative care was also consulted with assistance for goals of care.  Ultimate decision after discussion with oncology and palliative care was to remain a full code, place a PEG tube to help with nutrition and get some rehabilitation at SNF prior to determining if he is a candidate for cancer therapies.  Had multiple discussions with family regarding his overall guarded prognosis given age, complexity of medical problems but patient's family wanted to give him a chance.  He also tested positive for RPR and initial screening test for HIV.  His confirmatory HIV test was negative.  ID was consulted and he underwent lumbar puncture on 02/28/2018.  CSF VDRL was negative but protein was elevated 200.  He was treated with penicillin G for tertiary syphilis for total of 14 days ending 03/16/2019.  Hyponatremia to sodium of 126 improved by changing his free water flushes to NS flushes and switching diluent penicillin G from D5 to normal saline. At one point, he had some bleeding around his PEG tube as he may have accidentally pulled it which stopped with pressure and an abdominal binder was placed with no subsequent issues. He worked with physical therapy who recommended SNF for short-term rehab.  Discharge Diagnoses:  Principal Problem:   Pneumonia due to COVID-19 virus Active Problems:   Dyslipidemia   Glaucoma   Essential hypertension   Benign prostatic hyperplasia with urinary obstruction   Hearing loss   Weakness   Hypokalemia   Lab test positive for detection of COVID-19 virus   Cerebral atrophy (HCC)   Paranasal sinus disease   Retroperitoneal lymphadenopathy   Splenic mass   Advanced dementia ([place])  Delirium due to another medical condition, acute, hyperactive   Loss of appetite   Hyperlipidemia   GERD (gastroesophageal reflux disease)   HIV test positive [facility])    Discharge Instructions:  Discharge Instructions    Increase activity slowly   Complete by: As directed      Allergies as  of 03/16/2019   No Known Allergies     Medication List    STOP taking these medications   losartan 100 MG tablet Commonly known as: COZAAR   valsartan 160 MG tablet Commonly known as: DIOVAN     TAKE these medications   acetaminophen 500 MG tablet Commonly known as: TYLENOL Take 1 tablet (500 mg total) by mouth every 8 (eight) hours as needed for mild pain or moderate pain. What changed:   how much to take  when to take this  reasons to take this   aspirin EC 81 MG tablet Take 81 mg by mouth daily.   atorvastatin 10 MG tablet Commonly known as: LIPITOR Take 0.5 tablets (5 mg total) by mouth daily.   Azopt 1 % ophthalmic suspension Generic drug: brinzolamide Place 1 drop into both eyes 3 (three) times daily.   donepezil 10 MG tablet Commonly known as: ARICEPT Take 1 tablet (10 mg total) by mouth at bedtime.   feeding supplement (OSMOLITE 1.2 CAL) Liqd Place 1,000 mLs into feeding tube continuous. Initiate at 20 ml/hr and advance by 10 ml every 4 hours to reach 40 ml/hr   lactulose 10 GM/15ML Soln enema Commonly known as: CHRONULAC Place 300 mLs rectally 2 (two) times daily as needed for severe constipation.   lactulose 10 GM/15ML solution Commonly known as: CHRONULAC Take 45 mLs (30 g total) by mouth 2 (two) times daily as needed for mild constipation.   multivitamin with minerals Tabs tablet Take 1 tablet by mouth daily. Start taking on: March 17, 2019   polyethylene glycol powder 17 GM/SCOOP powder Commonly known as: GLYCOLAX/MIRALAX Take 8.5-17 g by mouth daily. Hold for diarrhea What changed:   when to take this  reasons to take this   QUEtiapine 25 MG tablet Commonly known as: SEROQUEL Take 1 tablet (25 mg total) by mouth at bedtime.   senna-docusate 8.6-50 MG tablet Commonly known as: Senokot-S Take 1 tablet by mouth 2 (two) times daily.   sodium chloride flush 0.9 % Soln Commonly known as: NS 10-40 mLs by Intracatheter route as needed  (flush).   Vyzulta 0.024 % Soln Generic drug: Latanoprostene Bunod Place 1 drop into both eyes at bedtime.       No Known Allergies  Consultations:  ID, IR, oncology, palliative care   Procedures/Studies: DG Abd 1 View  Result Date: 03/14/2019 CLINICAL DATA:  86 year old male status post NG tube placement. EXAM: ABDOMEN - 1 VIEW COMPARISON:  Chest radiograph dated 03/04/2019. FINDINGS: There has been interval removal of the previously seen feeding tube and placement of a percutaneous gastrostomy with balloon over the stomach. Partially visualized right-sided PICC with tip in the region of the cavoatrial junction. There is no bowel dilatation. Air is noted within the colon. A 6 mm radiopaque focus to the right of the spine likely corresponds to the vascular calcification of the right renal artery seen on the prior CT. Atherosclerotic calcification of the aorta. Osteopenia with degenerative changes of the spine. No acute osseous pathology. IMPRESSION: Percutaneous gastrostomy with balloon over the stomach. Electronically Signed   By: Anner Crete M.D.   On: 03/14/2019 20:00   DG  Abd 1 View  Result Date: 03/04/2019 CLINICAL DATA:  NG tube placement EXAM: ABDOMEN - 1 VIEW COMPARISON:  08/06/2005 FINDINGS: Feeding tube coils in the stomach with the tip in the fundus. Nonobstructive bowel gas pattern. Visualized lungs clear. IMPRESSION: Feeding tube coils in the stomach with the tip in the fundus. Electronically Signed   By: Rolm Baptise M.D.   On: 03/04/2019 19:27   CT Head Wo Contrast  Result Date: 02/22/2019 CLINICAL DATA:  Altered mental status with fatigue.  Dehydration. EXAM: CT HEAD WITHOUT CONTRAST TECHNIQUE: Contiguous axial images were obtained from the base of the skull through the vertex without intravenous contrast. COMPARISON:  None. FINDINGS: Brain: There is moderate diffuse atrophy. There is no intracranial mass, hemorrhage, extra-axial fluid collection, or midline shift.  Brain parenchyma appears unremarkable. No evident acute infarct. Vascular: There is no hyperdense vessel. There are foci of calcification in each carotid siphon region. Skull: Bony calvarium appears intact. Sinuses/Orbits: There is a retention cyst in the inferior posterior right maxillary antrum. There is mucosal thickening and opacification involving multiple ethmoid air cells. Orbits appear symmetric bilaterally except for evidence of previous cataract surgery on the right. Other: Mastoid air cells are clear. IMPRESSION: 1. Atrophy. Brain parenchyma appears unremarkable. No acute infarct. No mass or hemorrhage. 2.  Foci of arterial vascular calcification noted. 3.  Foci of paranasal sinus disease evident. Electronically Signed   By: Lowella Grip III M.D.   On: 02/22/2019 14:57   CT CHEST W CONTRAST  Result Date: 02/24/2019 CLINICAL DATA:  Provided history of colon cancer of unknown primary, surveillance Technologist notes state pneumonia due to COVID-19. EXAM: CT CHEST WITH CONTRAST TECHNIQUE: Multidetector CT imaging of the chest was performed during intravenous contrast administration. CONTRAST:  26mL OMNIPAQUE IOHEXOL 300 MG/ML  SOLN COMPARISON:  Chest radiograph 02/22/2019. Abdominal CT 02/12/2019. No remote prior chest CT. FINDINGS: Cardiovascular: Aortic atherosclerosis. No aneurysm or dissection. Coronary artery calcifications. Normal heart size. No pericardial effusion. Mediastinum/Nodes: Probable enlarged right hilar node measuring 18 mm short axis, series 2, image 66. no density appears similar to the adjacent pulmonary vasculature, but appears separate. Left supraclavicular adenopathy, largest node measuring 2 cm, series 2, image 19. No axillary adenopathy. No thyroid nodule. Esophagus slightly patulous but no wall thickening. Lungs/Pleura: Patchy bilateral ground-glass opacities within both lungs, right greater than left. Small bilateral pleural effusions. Background lung opacity limits  assessment for pulmonary nodule or mass, no dominant pulmonary mass visualized. Calcified granuloma in the right lower lobe. Mild elevation of right hemidiaphragm. Upper Abdomen: Assessed on recent abdominal CT. Retroperitoneal adenopathy. Hepatic and renal cysts and central splenic mass. Musculoskeletal: Multilevel degenerative change in the spine. There are no acute or suspicious osseous abnormalities. No evidence of lytic or blastic osseous lesion. IMPRESSION: 1. Right hilar and left supraclavicular adenopathy. Given abdominal adenopathy, findings suspicious for lymphoproliferative disorder. 2. Patchy bilateral ground-glass opacities within both lungs, right greater than left consistent with COVID pneumonia. 3. Small bilateral pleural effusions. 4. Aortic Atherosclerosis (ICD10-I70.0). Coronary artery calcifications. Electronically Signed   By: Keith Rake M.D.   On: 02/24/2019 23:25   CT ABDOMEN PELVIS W CONTRAST  Result Date: 02/22/2019 CLINICAL DATA:  Abdominal distension. Anorexia. EXAM: CT ABDOMEN AND PELVIS WITH CONTRAST TECHNIQUE: Multidetector CT imaging of the abdomen and pelvis was performed using the standard protocol following bolus administration of intravenous contrast. CONTRAST:  139mL OMNIPAQUE IOHEXOL 300 MG/ML  SOLN COMPARISON:  03/21/2007 FINDINGS: Lower Chest: Patchy areas of airspace disease are seen bilaterally  mainly in the lower lobes, suspicious for viral pneumonia or inflammatory process. No evidence of pleural effusion. Hepatobiliary: No hepatic masses identified. Multiple small hepatic cysts are stable. Gallbladder is unremarkable. No evidence of biliary ductal dilatation. Pancreas:  No mass or inflammatory changes. Spleen: No evidence of splenomegaly. A new low-attenuation soft tissue mass is seen in the central spleen which measures 3.8 x 3.4 cm. Adrenals/Urinary Tract: Several large renal cysts are again seen bilaterally. No masses identified. No evidence of ureteral  calculi or hydronephrosis. Unremarkable unopacified urinary bladder. Stomach/Bowel: No evidence of obstruction, inflammatory process or abnormal fluid collections. Vascular/Lymphatic: New abdominal retroperitoneal lymphadenopathy is seen in the region of the celiac axis and throughout the left paraaortic and aortocaval spaces. Largest conglomeration of lymph nodes in the left paraaortic region measures 4.9 x 3.1 cm on image 43/2. No pathologically enlarged lymph nodes seen within the pelvis. Aortic atherosclerosis incidentally noted. No abdominal aortic aneurysm. Reproductive:  No mass or other significant abnormality identified. Other:  None. Musculoskeletal:  No suspicious bone lesions identified. IMPRESSION: 1. New abdominal retroperitoneal lymphadenopathy and splenic mass. This is likely due to lymphoproliferative disorder, with metastatic disease considered less likely. 2. Patchy bilateral airspace disease mainly in the lower lobes, suspicious for viral pneumonia or inflammatory process. Pulmonary involvement by lymphoma is considered less likely but cannot be excluded. Recommend clinical correlation and COVID-19 virus testing. Electronically Signed   By: Marlaine Hind M.D.   On: 02/22/2019 15:05   IR GASTROSTOMY TUBE MOD SED  Result Date: 03/10/2019 INDICATION: COVID positive, dysphagia EXAM: FLUOROSCOPIC 20 FRENCH PULL-THROUGH GASTROSTOMY Date:  03/09/2019 03/09/2019 5:48 pm Radiologist:  Jerilynn Mages. Daryll Brod, MD Guidance:  Ultrasound and fluoroscopic MEDICATIONS: Ancef 2 g; Antibiotics were administered within 1 hour of the procedure. Glucagon 1 mg IV ANESTHESIA/SEDATION: Versed 1.0 mg IV; Fentanyl 0 mcg IV Moderate Sedation Time:  10 minutes The patient was continuously monitored during the procedure by the interventional radiology nurse under my direct supervision. CONTRAST:  20 cc-administered into the gastric lumen. FLUOROSCOPY TIME:  Fluoroscopy Time: 2 minutes 6 seconds (12 mGy). COMPLICATIONS: None  immediate. PROCEDURE: Informed consent was obtained from the patient following explanation of the procedure, risks, benefits and alternatives. The patient understands, agrees and consents for the procedure. All questions were addressed. A time out was performed. Maximal barrier sterile technique utilized including caps, mask, sterile gowns, sterile gloves, large sterile drape, hand hygiene, and betadine prep. The left upper quadrant was sterilely prepped and draped. An oral gastric catheter was inserted into the stomach under fluoroscopy. The existing nasogastric feeding tube was removed. Air was injected into the stomach for insufflation and visualization under fluoroscopy. The air distended stomach was confirmed beneath the anterior abdominal wall in the frontal and lateral projections. Under sterile conditions and local anesthesia, a 67 gauge trocar needle was utilized to access the stomach percutaneously beneath the left subcostal margin. Needle position was confirmed within the stomach under biplane fluoroscopy. Contrast injection confirmed position also. A single T tack was deployed for gastropexy. Over an Amplatz guide wire, a 9-French sheath was inserted into the stomach. A snare device was utilized to capture the oral gastric catheter. The snare device was pulled retrograde from the stomach up the esophagus and out the oropharynx. The 20-French pull-through gastrostomy was connected to the snare device and pulled antegrade through the oropharynx down the esophagus into the stomach and then through the percutaneous tract external to the patient. The gastrostomy was assembled externally. Contrast injection confirms position in  the stomach. Images were obtained for documentation. The patient tolerated procedure well. No immediate complication. IMPRESSION: Fluoroscopic insertion of a 20-French "pull-through" gastrostomy. Electronically Signed   By: Jerilynn Mages.  Shick M.D.   On: 03/10/2019 09:19   DG Chest Portable 1  View  Result Date: 02/22/2019 CLINICAL DATA:  Increasing shortness of breath over the past 7 days EXAM: PORTABLE CHEST 1 VIEW COMPARISON:  10/17/2003 FINDINGS: Cardiac shadow is accentuated by the portable technique. Aortic calcifications are noted. Elevation the right hemidiaphragm is noted new from the prior exam. No focal infiltrate or sizable effusion is seen. No bony abnormality is noted. IMPRESSION: No active disease. Electronically Signed   By: Inez Catalina M.D.   On: 02/22/2019 14:03   [place] CORE BIOPSY (LYMPH NODES)  Result Date: 02/28/2019 INDICATION: 86 year old male with a history supraclavicular adenopathy, possible lymphoma EXAM: ULTRASOUND-GUIDED BIOPSY OF LEFT SUPRACLAVICULAR/CERVICAL NODES MEDICATIONS: None. ANESTHESIA/SEDATION: None FLUOROSCOPY TIME:  None COMPLICATIONS: None PROCEDURE: Informed written consent was obtained from the patient after a thorough discussion of the procedural risks, benefits and alternatives. All questions were addressed. Maximal Sterile Barrier Technique was utilized including caps, mask, sterile gowns, sterile gloves, sterile drape, hand hygiene and skin antiseptic. A timeout was performed prior to the initiation of the procedure. Patient positioned supine position. Ultrasound survey was performed with images stored sent to PACs. The patient is prepped and draped in the usual sterile fashion. 1% lidocaine was used for local anesthesia. Using ultrasound guidance, multiple 18 gauge core biopsy were achieved of left supraclavicular/cervical lymph nodes. Tissues placed into fresh specimen/saline container. Final image was stored. Patient tolerated the procedure well and remained hemodynamically stable throughout. No complications were encountered and no significant blood loss. IMPRESSION: Status post ultrasound-guided biopsy of left supraclavicular lymph nodes. Signed, Dulcy Fanny. Dellia Nims, RPVI [facility]  Electronically Signed   By: Corrie Mckusick D.O.   On: 02/28/2019 17:15   [place] EKG SITE RITE  Result Date: 03/02/2019 If Site Rite image not attached, placement could not be confirmed due to current cardiac rhythm.  DG FL GUIDED LUMBAR PUNCTURE  Result Date: 03/01/2019 CLINICAL DATA:  Encephalopathy.  Positive syphilis screening test. EXAM: DIAGNOSTIC LUMBAR PUNCTURE UNDER FLUOROSCOPIC GUIDANCE FLUOROSCOPY TIME:  Fluoroscopy Time:  1 minutes 18 seconds Radiation exposure: 14.0 mGy Number of Acquired Spot Images: 0 PROCEDURE: Due to the patient's altered mental status, informed consent was obtained by telephone from the patient's son Artee Biddick. With the patient prone, the lower back was prepped with Betadine. 1% Lidocaine was used for local anesthesia. Lumbar puncture was performed at the L4-5 level using a 20 gauge needle with return of clear CSF with an opening pressure of 9 cm water. 10 ml of CSF were obtained for laboratory studies. The patient tolerated the procedure well and there were no apparent complications. IMPRESSION: Successful diagnostic lumbar puncture performed at L4-5 under fluoroscopic guidance. No immediate complication. Electronically Signed   By: Marlaine Hind M.D.   On: 03/01/2019 16:03       Subjective:   Discharge Exam: Vitals:   03/16/19 0930 03/16/19 1300  BP: 126/67   Pulse: 88 80  Resp: 18   Temp: 98.7 F (37.1 C)   SpO2: 97%    Vitals:   03/16/19 0128 03/16/19 0511 03/16/19 0930 03/16/19 1300  BP: 104/60 108/64 126/67   Pulse: 86 85 88 80  Resp: 17 19 18    Temp: 97.9 F (36.6 C) 98.4 F (36.9 C) 98.7 F (  37.1 C)   TempSrc: Oral Oral Axillary   SpO2: 96% 96% 97%   Weight:      Height:        GENERAL: Chronically ill-appearing.  No apparent distress. HEENT: MMM.  Vision grossly intact.  Impaired hearing. NECK: Supple.  No apparent JVD.  RESP: On RA.  No IWOB.  Fair aeration bilaterally. CVS:  RRR. Heart sounds normal.  ABD/GI/GU: Bowel sounds  present. Soft. Non tender.  PEG site tube no bleeding MSK/EXT:  Moves extremities. No apparent deformity. No edema.  SKIN: no apparent skin lesion or wound NEURO: Awake, alert.  Talking, but difficult to assess orientation as he is out of.. No apparent focal neuro deficit. PSYCH: Calm.  No signs of agitation.   The results of significant diagnostics from this hospitalization (including imaging, microbiology, ancillary and laboratory) are listed below for reference.     Microbiology: Recent Results (from the past 240 hour(s))  SARS CORONAVIRUS 2 (TAT 6-24 HRS) Nasopharyngeal Nasopharyngeal Swab     Status: None   Collection Time: 03/12/19 12:40 PM   Specimen: Nasopharyngeal Swab  Result Value Ref Range Status   SARS Coronavirus 2 NEGATIVE NEGATIVE Final    Comment: (NOTE) SARS-CoV-2 target nucleic acids are NOT DETECTED. The SARS-CoV-2 RNA is generally detectable in upper and lower respiratory specimens during the acute phase of infection. Negative results do not preclude SARS-CoV-2 infection, do not rule out co-infections with other pathogens, and should not be used as the sole basis for treatment or other patient management decisions. Negative results must be combined with clinical observations, patient history, and epidemiological information. The expected result is Negative. Fact Sheet for Patients: SugarRoll.be Fact Sheet for Healthcare Providers: https://www.woods-mathews.com/ This test is not yet approved or cleared by the [place] FDA and  has been authorized for detection and/or diagnosis of SARS-CoV-2 by FDA under an Emergency Use Authorization (EUA). This EUA will remain  in effect (meaning this test can be used) for the duration of the COVID-19 declaration under Section 56 4(b)(1) of the Act, 21 U.S.C. section 360bbb-3(b)(1), unless the authorization is terminated or revoked sooner. Performed at [facility],  [place] [address] [place], [place] [address]      Labs: BNP (last 3 results) No results for input(s): BNP in the last 8760 hours. Basic Metabolic Panel: Recent Labs  Lab 03/10/19 0303 03/10/19 0303 03/11/19 0500 03/12/19 0532 03/13/19 0449 03/14/19 0500 03/15/19 0438  NA 131*   < > 128* 126* 127* 128* 131*  K 4.2   < > 3.9 4.0 3.9 3.8 3.9  CL 98   < > 95* 94* 95* 97* 99  CO2 22   < > 26 24 24 24  21*  GLUCOSE 103*   < > 110* 121* 106* 119* 126*  BUN 23   < > 20 17 14 14 15   CREATININE 0.64   < > 0.38* 0.44* 0.44* 0.43* 0.30*  CALCIUM 8.5*   < > 8.2* 8.3* 8.3* 8.4* 8.4*  MG 2.2  --  2.1 2.0  --   --   --   PHOS 3.2  --  2.6 2.6  --   --   --    < > = values in this interval not displayed.   Liver Function Tests: No results for input(s): AST, ALT, ALKPHOS, BILITOT, PROT, ALBUMIN in the last 168 hours. No results for input(s): LIPASE, AMYLASE in the last 168 hours. No results for input(s): AMMONIA in the last 168  hours. CBC: Recent Labs  Lab 03/10/19 0303 03/11/19 0500 03/12/19 0532 03/15/19 0438  WBC 20.4* 11.7* 12.1* 15.7*  NEUTROABS 15.7*  --   --   --   HGB 12.9* 11.8* 12.0* 12.2*  HCT 38.2* 35.5* 35.8* 36.5*  MCV 93.9 93.7 93.5 94.8  PLT 230 219 228 235   Cardiac Enzymes: No results for input(s): CKTOTAL, CKMB, CKMBINDEX, TROPONINI in the last 168 hours. BNP: Invalid input(s): POCBNP CBG: Recent Labs  Lab 03/15/19 1949 03/15/19 2351 03/16/19 0410 03/16/19 0756 03/16/19 1220  GLUCAP 127* 103* 121* 121* 105*   D-Dimer No results for input(s): DDIMER in the last 72 hours. Hgb A1c No results for input(s): HGBA1C in the last 72 hours. Lipid Profile No results for input(s): CHOL, HDL, LDLCALC, TRIG, CHOLHDL, LDLDIRECT in the last 72 hours. Thyroid function studies No results for input(s): TSH, T4TOTAL, T3FREE, THYROIDAB in the last 72 hours.  Invalid input(s): FREET3 Anemia work up No results for input(s): VITAMINB12, FOLATE, FERRITIN, TIBC, IRON,  RETICCTPCT in the last 72 hours. Urinalysis    Component Value Date/Time   COLORURINE YELLOW 02/22/2019 1226   APPEARANCEUR CLEAR 02/22/2019 1226   LABSPEC 1.019 02/22/2019 1226   PHURINE 7.0 02/22/2019 1226   GLUCOSEU NEGATIVE 02/22/2019 1226   HGBUR NEGATIVE 02/22/2019 1226   BILIRUBINUR NEGATIVE 02/22/2019 1226   BILIRUBINUR negative 02/05/2019 1610   KETONESUR NEGATIVE 02/22/2019 1226   PROTEINUR NEGATIVE 02/22/2019 1226   UROBILINOGEN 0.2 02/05/2019 1610   UROBILINOGEN 0.2 03/21/2007 0944   NITRITE NEGATIVE 02/22/2019 1226   LEUKOCYTESUR NEGATIVE 02/22/2019 1226   Sepsis Labs Invalid input(s): PROCALCITONIN,  WBC,  LACTICIDVEN Microbiology Recent Results (from the past 240 hour(s))  SARS CORONAVIRUS 2 (TAT 6-24 HRS) Nasopharyngeal Nasopharyngeal Swab     Status: None   Collection Time: 03/12/19 12:40 PM   Specimen: Nasopharyngeal Swab  Result Value Ref Range Status   SARS Coronavirus 2 NEGATIVE NEGATIVE Final    Comment: (NOTE) SARS-CoV-2 target nucleic acids are NOT DETECTED. The SARS-CoV-2 RNA is generally detectable in upper and lower respiratory specimens during the acute phase of infection. Negative results do not preclude SARS-CoV-2 infection, do not rule out co-infections with other pathogens, and should not be used as the sole basis for treatment or other patient management decisions. Negative results must be combined with clinical observations, patient history, and epidemiological information. The expected result is Negative. Fact Sheet for Patients: SugarRoll.be Fact Sheet for Healthcare Providers: https://www.woods-mathews.com/ This test is not yet approved or cleared by the [place] FDA and  has been authorized for detection and/or diagnosis of SARS-CoV-2 by FDA under an Emergency Use Authorization (EUA). This EUA will remain  in effect (meaning this test can be used) for the duration of the COVID-19  declaration under Section 56 4(b)(1) of the Act, 21 U.S.C. section 360bbb-3(b)(1), unless the authorization is terminated or revoked sooner. Performed at [facility], [place] [address], [place],  [address]      Time coordinating discharge: Over 30 minutes  SIGNED:   Lucky Cowboy, MD  [facility] 03/16/2019, 4:20 PM  If 7PM-7AM, please contact night-coverage

## 2019-03-16 NOTE — Progress Notes (Signed)
Report given to nurse at [facility].Son updated on place of care . Awaiting transport

## 2019-03-16 NOTE — Progress Notes (Signed)
Patient picked up by ptar, left with all belongings from room, feeding capped off, male external catheter removed, called [facility] to verify that they had received report from dayshift nurse. The nurse there had no further questions.

## 2019-03-16 NOTE — TOC Progression Note (Addendum)
Transition of Care [facility]) - Progression Note    Patient Details  Name: [name] MRN: [id number] Date of Birth: [birth year]  [facility] of [facility]) CM/SW Contact  [name] Phone Number: 03/16/2019, 6:42 PM  Clinical Narrative:     CSW presented bed offers to patient's son and he chose [name].  MD is recommending palliative to follow, CSW made referral for [name] and spoke to April, they will follow up with patient at [name] on Monday.  CSW spoke to [name] they can accept patient today for short term rehab.  Patient has a feeding tube for continuous, per [facility] they do not have any continuous isomolite 1.2. until Monday.  CSW spoke to pharmacy, they are able to provide continuous isomolite for patient to take with him to SNF.  They are able to provide 6 bottles for patient.  CSW updated patient's family and facility.   Patient to be d/c'ed today to [name]        .  Patient and family agreeable to plans will transport via ems RN to call report.  Patient's son [name] was notified that patient is discharging today.    Barriers to Discharge: Barriers Resolved  Expected Discharge Plan and Services           Expected Discharge Date: 03/16/19               DME Arranged: N/A DME Agency: NA       HH Arranged: NA           Social Determinants of Health (SDOH) Interventions    Readmission Risk Interventions No flowsheet data found.

## 2019-03-18 ENCOUNTER — Emergency Department (HOSPITAL_COMMUNITY): Payer: Medicare Other

## 2019-03-18 ENCOUNTER — Inpatient Hospital Stay (HOSPITAL_COMMUNITY): Payer: Medicare Other

## 2019-03-18 ENCOUNTER — Inpatient Hospital Stay (HOSPITAL_COMMUNITY)
Admission: EM | Admit: 2019-03-18 | Discharge: 2019-03-27 | DRG: 871 | Disposition: A | Discharge status: Hospice | Payer: Medicare Other | Source: Skilled Nursing Facility | Attending: Internal Medicine | Admitting: Internal Medicine

## 2019-03-18 ENCOUNTER — Encounter (HOSPITAL_COMMUNITY): Payer: Self-pay | Admitting: Emergency Medicine

## 2019-03-18 ENCOUNTER — Other Ambulatory Visit: Payer: Self-pay

## 2019-03-18 DIAGNOSIS — R0902 Hypoxemia: Secondary | ICD-10-CM | POA: Diagnosis present

## 2019-03-18 DIAGNOSIS — C8330 Diffuse large B-cell lymphoma, unspecified site: Secondary | ICD-10-CM | POA: Diagnosis present

## 2019-03-18 DIAGNOSIS — Z833 Family history of diabetes mellitus: Secondary | ICD-10-CM

## 2019-03-18 DIAGNOSIS — L89154 Pressure ulcer of sacral region, stage 4: Secondary | ICD-10-CM

## 2019-03-18 DIAGNOSIS — F03C0 Unspecified dementia, severe, without behavioral disturbance, psychotic disturbance, mood disturbance, and anxiety: Secondary | ICD-10-CM | POA: Diagnosis present

## 2019-03-18 DIAGNOSIS — Z800 Family history of malignant neoplasm of digestive organs: Secondary | ICD-10-CM

## 2019-03-18 DIAGNOSIS — Z7189 Other specified counseling: Secondary | ICD-10-CM

## 2019-03-18 DIAGNOSIS — B20 Human immunodeficiency virus [HIV] disease: Secondary | ICD-10-CM | POA: Diagnosis not present

## 2019-03-18 DIAGNOSIS — I10 Essential (primary) hypertension: Secondary | ICD-10-CM | POA: Diagnosis present

## 2019-03-18 DIAGNOSIS — N400 Enlarged prostate without lower urinary tract symptoms: Secondary | ICD-10-CM | POA: Diagnosis present

## 2019-03-18 DIAGNOSIS — J690 Pneumonitis due to inhalation of food and vomit: Secondary | ICD-10-CM | POA: Diagnosis present

## 2019-03-18 DIAGNOSIS — R590 Localized enlarged lymph nodes: Secondary | ICD-10-CM | POA: Diagnosis not present

## 2019-03-18 DIAGNOSIS — J189 Pneumonia, unspecified organism: Secondary | ICD-10-CM

## 2019-03-18 DIAGNOSIS — M5116 Intervertebral disc disorders with radiculopathy, lumbar region: Secondary | ICD-10-CM | POA: Diagnosis not present

## 2019-03-18 DIAGNOSIS — U071 COVID-19: Secondary | ICD-10-CM | POA: Diagnosis not present

## 2019-03-18 DIAGNOSIS — I700 Atherosclerosis of aorta: Secondary | ICD-10-CM | POA: Diagnosis present

## 2019-03-18 DIAGNOSIS — Z66 Do not resuscitate: Secondary | ICD-10-CM | POA: Diagnosis not present

## 2019-03-18 DIAGNOSIS — R0689 Other abnormalities of breathing: Secondary | ICD-10-CM | POA: Diagnosis not present

## 2019-03-18 DIAGNOSIS — Z87891 Personal history of nicotine dependence: Secondary | ICD-10-CM | POA: Diagnosis not present

## 2019-03-18 DIAGNOSIS — Z974 Presence of external hearing-aid: Secondary | ICD-10-CM | POA: Diagnosis not present

## 2019-03-18 DIAGNOSIS — Z515 Encounter for palliative care: Secondary | ICD-10-CM | POA: Diagnosis not present

## 2019-03-18 DIAGNOSIS — N281 Cyst of kidney, acquired: Secondary | ICD-10-CM | POA: Diagnosis not present

## 2019-03-18 DIAGNOSIS — R509 Fever, unspecified: Secondary | ICD-10-CM

## 2019-03-18 DIAGNOSIS — Z8249 Family history of ischemic heart disease and other diseases of the circulatory system: Secondary | ICD-10-CM | POA: Diagnosis not present

## 2019-03-18 DIAGNOSIS — A419 Sepsis, unspecified organism: Secondary | ICD-10-CM | POA: Diagnosis not present

## 2019-03-18 DIAGNOSIS — G9341 Metabolic encephalopathy: Secondary | ICD-10-CM

## 2019-03-18 DIAGNOSIS — J1282 Pneumonia due to coronavirus disease 2019: Secondary | ICD-10-CM | POA: Diagnosis not present

## 2019-03-18 DIAGNOSIS — C8590 Non-Hodgkin lymphoma, unspecified, unspecified site: Secondary | ICD-10-CM | POA: Diagnosis present

## 2019-03-18 DIAGNOSIS — E785 Hyperlipidemia, unspecified: Secondary | ICD-10-CM | POA: Diagnosis present

## 2019-03-18 DIAGNOSIS — Z6826 Body mass index (BMI) 26.0-26.9, adult: Secondary | ICD-10-CM

## 2019-03-18 DIAGNOSIS — Z79899 Other long term (current) drug therapy: Secondary | ICD-10-CM | POA: Diagnosis not present

## 2019-03-18 DIAGNOSIS — R0603 Acute respiratory distress: Secondary | ICD-10-CM | POA: Diagnosis present

## 2019-03-18 DIAGNOSIS — F0390 Unspecified dementia without behavioral disturbance: Secondary | ICD-10-CM | POA: Diagnosis present

## 2019-03-18 DIAGNOSIS — H409 Unspecified glaucoma: Secondary | ICD-10-CM | POA: Diagnosis present

## 2019-03-18 DIAGNOSIS — J984 Other disorders of lung: Secondary | ICD-10-CM | POA: Diagnosis not present

## 2019-03-18 DIAGNOSIS — Z931 Gastrostomy status: Secondary | ICD-10-CM | POA: Diagnosis not present

## 2019-03-18 DIAGNOSIS — M4126 Other idiopathic scoliosis, lumbar region: Secondary | ICD-10-CM | POA: Diagnosis not present

## 2019-03-18 DIAGNOSIS — R64 Cachexia: Secondary | ICD-10-CM | POA: Diagnosis present

## 2019-03-18 DIAGNOSIS — Z7401 Bed confinement status: Secondary | ICD-10-CM | POA: Diagnosis not present

## 2019-03-18 DIAGNOSIS — Z8616 Personal history of COVID-19: Secondary | ICD-10-CM

## 2019-03-18 DIAGNOSIS — R32 Unspecified urinary incontinence: Secondary | ICD-10-CM | POA: Diagnosis not present

## 2019-03-18 DIAGNOSIS — C8510 Unspecified B-cell lymphoma, unspecified site: Secondary | ICD-10-CM | POA: Diagnosis not present

## 2019-03-18 DIAGNOSIS — R1310 Dysphagia, unspecified: Secondary | ICD-10-CM

## 2019-03-18 DIAGNOSIS — E876 Hypokalemia: Secondary | ICD-10-CM | POA: Diagnosis present

## 2019-03-18 DIAGNOSIS — K219 Gastro-esophageal reflux disease without esophagitis: Secondary | ICD-10-CM | POA: Diagnosis present

## 2019-03-18 DIAGNOSIS — R6520 Severe sepsis without septic shock: Secondary | ICD-10-CM | POA: Diagnosis present

## 2019-03-18 DIAGNOSIS — H9190 Unspecified hearing loss, unspecified ear: Secondary | ICD-10-CM | POA: Diagnosis present

## 2019-03-18 DIAGNOSIS — M6259 Muscle wasting and atrophy, not elsewhere classified, multiple sites: Secondary | ICD-10-CM | POA: Diagnosis not present

## 2019-03-18 DIAGNOSIS — R918 Other nonspecific abnormal finding of lung field: Secondary | ICD-10-CM | POA: Diagnosis not present

## 2019-03-18 DIAGNOSIS — M4726 Other spondylosis with radiculopathy, lumbar region: Secondary | ICD-10-CM | POA: Diagnosis not present

## 2019-03-18 DIAGNOSIS — K5900 Constipation, unspecified: Secondary | ICD-10-CM | POA: Diagnosis present

## 2019-03-18 DIAGNOSIS — R404 Transient alteration of awareness: Secondary | ICD-10-CM | POA: Diagnosis not present

## 2019-03-18 DIAGNOSIS — R627 Adult failure to thrive: Secondary | ICD-10-CM | POA: Diagnosis present

## 2019-03-18 DIAGNOSIS — N138 Other obstructive and reflux uropathy: Secondary | ICD-10-CM | POA: Diagnosis present

## 2019-03-18 DIAGNOSIS — Z7982 Long term (current) use of aspirin: Secondary | ICD-10-CM

## 2019-03-18 DIAGNOSIS — Z741 Need for assistance with personal care: Secondary | ICD-10-CM | POA: Diagnosis not present

## 2019-03-18 DIAGNOSIS — R000 Tachycardia, unspecified: Secondary | ICD-10-CM | POA: Diagnosis not present

## 2019-03-18 DIAGNOSIS — M8580 Other specified disorders of bone density and structure, unspecified site: Secondary | ICD-10-CM | POA: Diagnosis present

## 2019-03-18 DIAGNOSIS — Z934 Other artificial openings of gastrointestinal tract status: Secondary | ICD-10-CM | POA: Diagnosis not present

## 2019-03-18 DIAGNOSIS — R6510 Systemic inflammatory response syndrome (SIRS) of non-infectious origin without acute organ dysfunction: Secondary | ICD-10-CM

## 2019-03-18 DIAGNOSIS — M2550 Pain in unspecified joint: Secondary | ICD-10-CM | POA: Diagnosis not present

## 2019-03-18 DIAGNOSIS — I708 Atherosclerosis of other arteries: Secondary | ICD-10-CM | POA: Diagnosis present

## 2019-03-18 DIAGNOSIS — Z806 Family history of leukemia: Secondary | ICD-10-CM | POA: Diagnosis not present

## 2019-03-18 DIAGNOSIS — R410 Disorientation, unspecified: Secondary | ICD-10-CM | POA: Diagnosis not present

## 2019-03-18 DIAGNOSIS — J309 Allergic rhinitis, unspecified: Secondary | ICD-10-CM | POA: Diagnosis present

## 2019-03-18 DIAGNOSIS — R0602 Shortness of breath: Secondary | ICD-10-CM | POA: Diagnosis not present

## 2019-03-18 DIAGNOSIS — G9340 Encephalopathy, unspecified: Secondary | ICD-10-CM | POA: Diagnosis not present

## 2019-03-18 DIAGNOSIS — E119 Type 2 diabetes mellitus without complications: Secondary | ICD-10-CM | POA: Diagnosis not present

## 2019-03-18 DIAGNOSIS — Z803 Family history of malignant neoplasm of breast: Secondary | ICD-10-CM | POA: Diagnosis not present

## 2019-03-18 DIAGNOSIS — R6251 Failure to thrive (child): Secondary | ICD-10-CM | POA: Diagnosis present

## 2019-03-18 DIAGNOSIS — A523 Neurosyphilis, unspecified: Secondary | ICD-10-CM | POA: Diagnosis not present

## 2019-03-18 DIAGNOSIS — R630 Anorexia: Secondary | ICD-10-CM | POA: Diagnosis present

## 2019-03-18 DIAGNOSIS — Z452 Encounter for adjustment and management of vascular access device: Secondary | ICD-10-CM

## 2019-03-18 DIAGNOSIS — L89150 Pressure ulcer of sacral region, unstageable: Secondary | ICD-10-CM

## 2019-03-18 DIAGNOSIS — G3184 Mild cognitive impairment, so stated: Secondary | ICD-10-CM | POA: Diagnosis not present

## 2019-03-18 LAB — URINALYSIS, ROUTINE W REFLEX MICROSCOPIC
Bilirubin Urine: NEGATIVE
Glucose, UA: NEGATIVE mg/dL
Hgb urine dipstick: NEGATIVE
Ketones, ur: NEGATIVE mg/dL
Leukocytes,Ua: NEGATIVE
Nitrite: NEGATIVE
Protein, ur: NEGATIVE mg/dL
Specific Gravity, Urine: 1.016 (ref 1.005–1.030)
pH: 7 (ref 5.0–8.0)

## 2019-03-18 LAB — COMPREHENSIVE METABOLIC PANEL
ALT: 21 U/L (ref 0–44)
AST: 29 U/L (ref 15–41)
Albumin: 2.2 g/dL — ABNORMAL LOW (ref 3.5–5.0)
Alkaline Phosphatase: 64 U/L (ref 38–126)
Anion gap: 7 (ref 5–15)
BUN: 28 mg/dL — ABNORMAL HIGH (ref 8–23)
CO2: 25 mmol/L (ref 22–32)
Calcium: 8.3 mg/dL — ABNORMAL LOW (ref 8.9–10.3)
Chloride: 103 mmol/L (ref 98–111)
Creatinine, Ser: 0.53 mg/dL — ABNORMAL LOW (ref 0.61–1.24)
GFR calc Af Amer: >60 mL/min (ref >60)
GFR calc non Af Amer: >60 mL/min (ref >60)
Glucose, Bld: 114 mg/dL — ABNORMAL HIGH (ref 70–99)
Potassium: 4.7 mmol/L (ref 3.5–5.1)
Sodium: 135 mmol/L (ref 135–145)
Total Bilirubin: 0.6 mg/dL (ref 0.3–1.2)
Total Protein: 5.9 g/dL — ABNORMAL LOW (ref 6.5–8.1)

## 2019-03-18 LAB — MRSA PCR SCREENING: MRSA by PCR: NEGATIVE

## 2019-03-18 LAB — CBC WITH DIFFERENTIAL/PLATELET
Abs Immature Granulocytes: 0.07 10*3/uL (ref 0.00–0.07)
Basophils Absolute: 0 10*3/uL (ref 0.0–0.1)
Basophils Relative: 0 %
Eosinophils Absolute: 0.3 10*3/uL (ref 0.0–0.5)
Eosinophils Relative: 2 %
HCT: 37.2 % — ABNORMAL LOW (ref 39.0–52.0)
Hemoglobin: 11.9 g/dL — ABNORMAL LOW (ref 13.0–17.0)
Immature Granulocytes: 1 %
Lymphocytes Relative: 9 %
Lymphs Abs: 1.3 10*3/uL (ref 0.7–4.0)
MCH: 31.2 pg (ref 26.0–34.0)
MCHC: 32 g/dL (ref 30.0–36.0)
MCV: 97.4 fL (ref 80.0–100.0)
Monocytes Absolute: 3.8 10*3/uL — ABNORMAL HIGH (ref 0.1–1.0)
Monocytes Relative: 28 %
Neutro Abs: 8.3 10*3/uL — ABNORMAL HIGH (ref 1.7–7.7)
Neutrophils Relative %: 60 %
Platelets: 291 10*3/uL (ref 150–400)
RBC: 3.82 MIL/uL — ABNORMAL LOW (ref 4.22–5.81)
RDW: 14.9 % (ref 11.5–15.5)
WBC: 13.7 10*3/uL — ABNORMAL HIGH (ref 4.0–10.5)
nRBC: 0 % (ref 0.0–0.2)

## 2019-03-18 LAB — GLUCOSE, CAPILLARY
Glucose-Capillary: 89 mg/dL (ref 70–99)
Glucose-Capillary: 79 mg/dL (ref 70–99)

## 2019-03-18 LAB — PROTIME-INR
INR: 1.2 (ref 0.8–1.2)
Prothrombin Time: 15.1 seconds (ref 11.4–15.2)

## 2019-03-18 LAB — TSH: TSH: 1.599 u[IU]/mL (ref 0.350–4.500)

## 2019-03-18 LAB — LACTIC ACID, PLASMA: Lactic Acid, Venous: 1.5 mmol/L (ref 0.5–1.9)

## 2019-03-18 LAB — PROCALCITONIN: Procalcitonin: <0.1 ng/mL

## 2019-03-18 LAB — INFLUENZA PANEL BY PCR (TYPE A & B)
Influenza A By PCR: NEGATIVE
Influenza B By PCR: NEGATIVE

## 2019-03-18 MED ORDER — LACTATED RINGERS IV BOLUS (SEPSIS)
1000.0000 mL | Freq: Once | INTRAVENOUS | Status: AC
  Administered 2019-03-18: 06:00:00 1000 mL via INTRAVENOUS

## 2019-03-18 MED ORDER — SODIUM CHLORIDE 0.9 % IV SOLN
2.0000 g | Freq: Three times a day (TID) | INTRAVENOUS | Status: DC
  Administered 2019-03-18 – 2019-03-23 (×15): 2 g via INTRAVENOUS
  Filled 2019-03-18 (×16): qty 2

## 2019-03-18 MED ORDER — VANCOMYCIN HCL 1500 MG/300ML IV SOLN
1500.0000 mg | Freq: Once | INTRAVENOUS | Status: AC
  Administered 2019-03-18: 08:00:00 1500 mg via INTRAVENOUS
  Filled 2019-03-18: qty 300

## 2019-03-18 MED ORDER — OSMOLITE 1.2 CAL PO LIQD
1000.0000 mL | ORAL | Status: AC
  Administered 2019-03-18: 1000 mL
  Filled 2019-03-18 (×2): qty 1000

## 2019-03-18 MED ORDER — SODIUM CHLORIDE 0.9 % IV SOLN: INTRAVENOUS | Status: DC

## 2019-03-18 MED ORDER — LACTATED RINGERS IV BOLUS (SEPSIS)
1000.0000 mL | Freq: Once | INTRAVENOUS | Status: AC
  Administered 2019-03-18: 1000 mL via INTRAVENOUS

## 2019-03-18 MED ORDER — METRONIDAZOLE IN NACL 5-0.79 MG/ML-% IV SOLN: 500.0000 mg | Freq: Three times a day (TID) | INTRAVENOUS | Status: DC

## 2019-03-18 MED ORDER — SODIUM CHLORIDE 0.9 % IV SOLN
2.0000 g | Freq: Once | INTRAVENOUS | Status: AC
  Administered 2019-03-18: 06:00:00 2 g via INTRAVENOUS
  Filled 2019-03-18: qty 2

## 2019-03-18 MED ORDER — VANCOMYCIN HCL IN DEXTROSE 1-5 GM/200ML-% IV SOLN: 1000.0000 mg | Freq: Once | INTRAVENOUS | Status: DC

## 2019-03-18 MED ORDER — SODIUM CHLORIDE 0.9% FLUSH
10.0000 mL | INTRAVENOUS | Status: DC | PRN
  Administered 2019-03-21: 10 mL

## 2019-03-18 MED ORDER — ONDANSETRON HCL 4 MG/2ML IJ SOLN: 4.0000 mg | Freq: Four times a day (QID) | INTRAMUSCULAR | Status: DC | PRN

## 2019-03-18 MED ORDER — ENOXAPARIN SODIUM 40 MG/0.4ML ~~LOC~~ SOLN
40.0000 mg | SUBCUTANEOUS | Status: DC
  Administered 2019-03-18 – 2019-03-26 (×9): 40 mg via SUBCUTANEOUS
  Filled 2019-03-18 (×10): qty 0.4

## 2019-03-18 MED ORDER — CHLORHEXIDINE GLUCONATE CLOTH 2 % EX PADS
6.0000 | MEDICATED_PAD | Freq: Every day | CUTANEOUS | Status: DC
  Administered 2019-03-18 – 2019-03-27 (×10): 6 via TOPICAL

## 2019-03-18 MED ORDER — LATANOPROSTENE BUNOD 0.024 % OP SOLN: 1.0000 [drp] | Freq: Every day | OPHTHALMIC | Status: DC

## 2019-03-18 MED ORDER — SODIUM CHLORIDE 0.9% FLUSH
10.0000 mL | Freq: Two times a day (BID) | INTRAVENOUS | Status: DC
  Administered 2019-03-18: 30 mL
  Administered 2019-03-19 – 2019-03-26 (×4): 10 mL

## 2019-03-18 MED ORDER — LACTATED RINGERS IV BOLUS (SEPSIS)
250.0000 mL | Freq: Once | INTRAVENOUS | Status: AC
  Administered 2019-03-18: 250 mL via INTRAVENOUS

## 2019-03-18 MED ORDER — LATANOPROST 0.005 % OP SOLN
1.0000 [drp] | Freq: Every day | OPHTHALMIC | Status: DC
  Administered 2019-03-18 – 2019-03-26 (×9): 1 [drp] via OPHTHALMIC
  Filled 2019-03-18: qty 2.5

## 2019-03-18 MED ORDER — ONDANSETRON HCL 4 MG PO TABS: 4.0000 mg | ORAL_TABLET | Freq: Four times a day (QID) | ORAL | Status: DC | PRN

## 2019-03-18 MED ORDER — VANCOMYCIN HCL 1250 MG/250ML IV SOLN
1250.0000 mg | INTRAVENOUS | Status: DC
  Administered 2019-03-19 – 2019-03-20 (×2): 1250 mg via INTRAVENOUS
  Filled 2019-03-18 (×2): qty 250

## 2019-03-18 MED ORDER — ACETAMINOPHEN 650 MG RE SUPP
650.0000 mg | Freq: Four times a day (QID) | RECTAL | Status: DC | PRN
  Administered 2019-03-24 – 2019-03-26 (×3): 650 mg via RECTAL
  Filled 2019-03-18 (×3): qty 1

## 2019-03-18 MED ORDER — BRINZOLAMIDE 1 % OP SUSP
1.0000 [drp] | Freq: Three times a day (TID) | OPHTHALMIC | Status: DC
  Administered 2019-03-18 – 2019-03-27 (×27): 1 [drp] via OPHTHALMIC
  Filled 2019-03-18: qty 10

## 2019-03-18 MED ORDER — ACETAMINOPHEN 325 MG PO TABS
650.0000 mg | ORAL_TABLET | Freq: Four times a day (QID) | ORAL | Status: DC | PRN
  Administered 2019-03-21 – 2019-03-23 (×2): 650 mg via ORAL
  Filled 2019-03-18 (×2): qty 2

## 2019-03-18 MED ORDER — ACETAMINOPHEN 650 MG RE SUPP
650.0000 mg | Freq: Once | RECTAL | Status: AC
  Administered 2019-03-18: 650 mg via RECTAL
  Filled 2019-03-18: qty 1

## 2019-03-18 MED ORDER — SODIUM CHLORIDE 0.9 % IV SOLN
INTRAVENOUS | Status: DC | PRN
  Administered 2019-03-18: 13:00:00 250 mL via INTRAVENOUS

## 2019-03-18 MED ORDER — METRONIDAZOLE IN NACL 5-0.79 MG/ML-% IV SOLN
500.0000 mg | Freq: Once | INTRAVENOUS | Status: AC
  Administered 2019-03-18: 07:00:00 500 mg via INTRAVENOUS
  Filled 2019-03-18: qty 100

## 2019-03-18 MED ORDER — METRONIDAZOLE IN NACL 5-0.79 MG/ML-% IV SOLN
500.0000 mg | Freq: Three times a day (TID) | INTRAVENOUS | Status: DC
  Administered 2019-03-18 – 2019-03-26 (×25): 500 mg via INTRAVENOUS
  Filled 2019-03-18 (×25): qty 100

## 2019-03-18 NOTE — H&P (Addendum)
History and Physical    [name] [id number] DOB: 1928/12/04 DOA: 03/18/2019  PCP: [name], MD  [facility] from[name]  I have personally briefly reviewed patient's old medical records in [place]  Chief Complaint: AMS, respiratory distress  HPI: [name] is a 86 y.o. male with medical history significant of events dementia, hypertension, hyperlipidemia, GERD, BPH who presents from his skilled nursing facility with reports of altered mental status and respiratory distress.  Patient is unable to participate due to his current mental status no family present at bedside.  Majority of HPI obtained from EMR, EMS notes, and ED provider report.  Nursing at SNF reported that patient appeared to be in moderate distress and was gurgling with white sputum production with oxygen saturations 74% on room air, in which she does not wear oxygen at baseline.  EMS was called and placement plate was placed on a nonrebreather.  Patient with recent extensive hospitalization, 12/31 - 1/22 for Covid-19 pneumonia and questionable neurosyphilis in which he completed treatment with remdesivir, dexamethasone, and IV penicillin.  Additionally, he was also diagnosed at that point with B-cell non-Hodgkin's lymphoma.  At admission, palliative care was consulted with recommendations of transitioning his care to more comfort given his advanced dementia and poor functional status, although they ultimately decided to continue aggressive measures following discharge.  ED Course: Temperature 101.1, HR 108, RR 27, 95/54, SPO2 96% on room air.  WBC count 13.7, hemoglobin 11.9, platelets 291.  Sodium 135, potassium 4.7, chloride 103, CO2 25, BUN 28, creatinine 0.53, glucose 114.  Lactic acid 1.5.  Urinalysis unremarkable.  INR 1.2.  Chest x-ray with mild faint residual opacities with recent history of Covid pneumonia.  Patient was started on vancomycin, cefepime, Flagyl for sepsis of unclear  etiology.  TRH consulted for admission for sepsis of unclear etiology.  Review of Systems: As per HPI otherwise 10 point review of systems negative.    Past Medical History:  Diagnosis Date  . Allergic rhinitis   . BPH (benign prostatic hypertrophy) with urinary obstruction 2000   40gm, L lobe 53mm prostate nodule; followed yearly by Dr Diona Fanti  . GERD (gastroesophageal reflux disease)   . Glaucoma   . Herpes zoster ophthalmicus    history  . History of kidney stones remote   x 1-Ca monhydrate, hyperoxalate urine s/p lithotripsy  . HLD (hyperlipidemia)   . HTN (hypertension)   . Impaired glucose tolerance   . Impaired hearing    uses hearing aides bilaterally  . Osteopenia     Past Surgical History:  Procedure Laterality Date  . APPENDECTOMY  1997  . CATARACT EXTRACTION Right 11/1013   Dr. Venetia Maxon  . COLONOSCOPY  2002   WNL-Kaplan; repeat 2012  . COLONOSCOPY  02/2012   tubular adenoma Deatra Ina)  . [place]   left-1955; right-1970  . IR GASTROSTOMY TUBE MOD SED  03/09/2019  . LITHOTRIPSY  2005  . SBO  2008, 2010     reports that he has quit smoking. His smoking use included cigarettes. He has never used smokeless tobacco. He reports that he does not drink alcohol or use drugs.  No Known Allergies  Family History  Problem Relation Age of Onset  . Coronary artery disease Father   . Healthy Mother   . Diabetes Brother   . Breast cancer Sister   . Leukemia Maternal Uncle   . Pancreatic cancer Sister     Family history  reviewed and not pertinent   Prior to Admission medications   Medication Sig Start Date End Date Taking? Authorizing Provider  aspirin EC 81 MG tablet Take 81 mg by mouth daily.   Yes [provider]  atorvastatin (LIPITOR) 10 MG tablet Take 0.5 tablets (5 mg total) by mouth daily. 04/20/18  Yes [name], MD  AZOPT 1 % ophthalmic suspension Place 1 drop into both eyes 3 (three) times daily. 02/20/19  Yes  [provider]  donepezil (ARICEPT) 10 MG tablet Take 1 tablet (10 mg total) by mouth at bedtime. 04/20/18  Yes [name], MD  Multiple Vitamin (MULTIVITAMIN WITH MINERALS) TABS tablet Take 1 tablet by mouth daily. 03/17/19  Yes Lucky Cowboy, MD  Nutritional Supplements (FEEDING SUPPLEMENT, OSMOLITE 1.2 CAL,) LIQD Place 1,000 mLs into feeding tube continuous. Initiate at 20 ml/hr and advance by 10 ml every 4 hours to reach 40 ml/hr 03/16/19  Yes Lucky Cowboy, MD  polyethylene glycol (MIRALAX / GLYCOLAX) 17 g packet Take 17 g by mouth daily.   Yes [provider]  QUEtiapine (SEROQUEL) 25 MG tablet Take 1 tablet (25 mg total) by mouth at bedtime. 03/16/19  Yes Lucky Cowboy, MD  senna-docusate (SENOKOT-S) 8.6-50 MG tablet Take 1 tablet by mouth 2 (two) times daily. 03/16/19  Yes Lucky Cowboy, MD  sodium chloride flush (NS) 0.9 % SOLN 10-40 mLs by Intracatheter route as needed (flush). 03/16/19  Yes Lucky Cowboy, MD  VYZULTA 0.024 % SOLN Place 1 drop into both eyes at bedtime. 02/20/19  Yes [provider]  acetaminophen (TYLENOL) 500 MG tablet Take 1 tablet (500 mg total) by mouth every 8 (eight) hours as needed for mild pain or moderate pain. 03/16/19   Lucky Cowboy, MD  lactulose (CHRONULAC) 10 GM/15ML SOLN enema Place 300 mLs rectally 2 (two) times daily as needed for severe constipation. 03/16/19   Lucky Cowboy, MD  lactulose (CHRONULAC) 10 GM/15ML solution Take 45 mLs (30 g total) by mouth 2 (two) times daily as needed for mild constipation. Patient not taking: Reported on 03/18/2019 03/16/19   Lucky Cowboy, MD  polyethylene glycol powder [facility]) 17 GM/SCOOP powder Take 8.5-17 g by mouth daily. Hold for diarrhea Patient not taking: Reported on 03/18/2019 02/08/19   [name], MD    Physical Exam: Vitals:   03/18/19 0617 03/18/19 0630 03/18/19 0700 03/18/19 0800  BP: (!) 128/113 (!) 95/54 (!) 112/44 117/65  Pulse: (!) 106 97 93 92    Resp: (!) 26 19 20 20   Temp: (!) 101.1 F (38.4 C)     TempSrc: Rectal     SpO2: 93% 96% 96% 98%    Constitutional: Thin/cachectic, chronically ill, nonverbal Eyes: PERRL, lids and conjunctivae normal ENMT: Mucous membranes are severely dry. Posterior pharynx clear of any exudate or lesions. Neck: normal, supple, no masses, no thyromegaly Respiratory: Shallow respiratory effort, breath sounds slightly decreased bilateral bases, no wheezing/crackles, normal respiratory effort without accessory muscle use, oxygenating 96% on room air  Cardiovascular: Regular rate and rhythm, no murmurs / rubs / gallops. No extremity edema. 2+ pedal pulses. No carotid bruits.  Abdomen: no tenderness, no masses palpated. No hepatosplenomegaly. Bowel sounds positive.  PEG tube noted in place Musculoskeletal: no clubbing / cyanosis. No joint deformity upper and lower extremities.  Decreased muscle mass.  Right upper arm PICC line noted in place, wrapped. Skin: Pressure ulcer noted sacral region, see picture Neurologic: Nonverbal, minimal movement of extremities Psychiatric:  Alert, nonverbal       Labs on Admission: I have personally reviewed following labs and imaging studies  CBC: Recent Labs  Lab 03/12/19 0532 03/15/19 0438 03/18/19 0535  WBC 12.1* 15.7* 13.7*  NEUTROABS  --   --  8.3*  HGB 12.0* 12.2* 11.9*  HCT 35.8* 36.5* 37.2*  MCV 93.5 94.8 97.4  PLT 228 235 [id number]   Basic Metabolic Panel: Recent Labs  Lab 03/12/19 0532 03/13/19 0449 03/14/19 0500 03/15/19 0438 03/18/19 0535  NA 126* 127* 128* 131* 135  K 4.0 3.9 3.8 3.9 4.7  CL 94* 95* 97* 99 103  CO2 24 24 24  21* 25  GLUCOSE 121* 106* 119* 126* 114*  BUN 17 14 14 15  28*  CREATININE 0.44* 0.44* 0.43* 0.30* 0.53*  CALCIUM 8.3* 8.3* 8.4* 8.4* 8.3*  MG 2.0  --   --   --   --   PHOS 2.6  --   --   --   --    GFR: Estimated Creatinine Clearance: 57.6 mL/min (A) (by C-G formula based on SCr of 0.53 mg/dL (L)). Liver Function  Tests: Recent Labs  Lab 03/18/19 0535  AST 29  ALT 21  ALKPHOS 64  BILITOT 0.6  PROT 5.9*  ALBUMIN 2.2*   No results for input(s): LIPASE, AMYLASE in the last 168 hours. No results for input(s): AMMONIA in the last 168 hours. Coagulation Profile: Recent Labs  Lab 03/18/19 0535  INR 1.2   Cardiac Enzymes: No results for input(s): CKTOTAL, CKMB, CKMBINDEX, TROPONINI in the last 168 hours. BNP (last 3 results) No results for input(s): PROBNP in the last 8760 hours. HbA1C: No results for input(s): HGBA1C in the last 72 hours. CBG: Recent Labs  Lab 03/15/19 2351 03/16/19 0410 03/16/19 0756 03/16/19 1220 03/16/19 1654  GLUCAP 103* 121* 121* 105* 100*   Lipid Profile: No results for input(s): CHOL, HDL, LDLCALC, TRIG, CHOLHDL, LDLDIRECT in the last 72 hours. Thyroid Function Tests: No results for input(s): TSH, T4TOTAL, FREET4, T3FREE, THYROIDAB in the last 72 hours. Anemia Panel: No results for input(s): VITAMINB12, FOLATE, FERRITIN, TIBC, IRON, RETICCTPCT in the last 72 hours. Urine analysis:    Component Value Date/Time   COLORURINE YELLOW 03/18/2019 0535   APPEARANCEUR CLEAR 03/18/2019 0535   LABSPEC 1.016 03/18/2019 0535   PHURINE 7.0 03/18/2019 0535   GLUCOSEU NEGATIVE 03/18/2019 0535   HGBUR NEGATIVE 03/18/2019 0535   BILIRUBINUR NEGATIVE 03/18/2019 0535   BILIRUBINUR negative 02/05/2019 1610   KETONESUR NEGATIVE 03/18/2019 0535   PROTEINUR NEGATIVE 03/18/2019 0535   UROBILINOGEN 0.2 02/05/2019 1610   UROBILINOGEN 0.2 03/21/2007 0944   NITRITE NEGATIVE 03/18/2019 0535   LEUKOCYTESUR NEGATIVE 03/18/2019 0535    Radiological Exams on Admission: DG Chest Port 1 View  Result Date: 03/18/2019 CLINICAL DATA:  Shortness of breath, respiratory distress EXAM: PORTABLE CHEST 1 VIEW COMPARISON:  CT chest dated 02/24/2019 FINDINGS: Mild faint residual opacities in the central right upper lobe and bilateral lower lobes. Eventration of right hemidiaphragm. No pleural  effusion or pneumothorax. The heart is normal in size.  Thoracic aortic atherosclerosis. IMPRESSION: Mild faint residual pulmonary opacities in this patient with recent COVID pneumonia. Electronically Signed   By: Julian Hy M.D.   On: 03/18/2019 07:40    EKG: Independently reviewed.   Assessment/Plan Principal Problem:   Sepsis ([place]) Active Problems:   Dyslipidemia   Glaucoma   Essential hypertension   Advanced dementia ([place])   Diffuse large cell non-Hodgkin's lymphoma ([place])  Stage IV pressure ulcer of sacral region ([place])    Sepsis, present on admission Patient presenting from SNF following recent discharge 2 days prior with reported hypoxia, altered mental status and white sputum production.  Patient with fever of 101.1, hypotension, tachycardia, and tachypnea on presentation with elevated white blood cell count of 13.7.  Urinalysis negative.  Recently treated for Covid-19 pneumonia and neurosyphilis with penicillin.  Unclear etiology, but patient does have a PEG tube in place,  right PICC line, and sacral ulcer as possible sources.  Also consideration of aspiration pneumonia with tube feeds.  Also consider fever from underlying non-Hodgkin's lymphoma. --Check KUB, for PEG placement --Blood cultures x2: Pending --Urine culture pending --Check procalcitonin and MRSA PCR --Continue empiric antibiotics with vancomycin, cefepime, Flagyl  Diffuse large cell non-Hodgkin's lymphoma And biopsy of supraclavicular lymph node on 02/27/2018 notable for diffuse large B cell non-Hodgkin's lymphoma.  Oncology was consulted and given his current functional status, advanced dementia does not appear to be a candidate for any type of treatment.   --Palliative care consult --Oncology outpatient follow-up  Essential hypertension Patient hypotensive on presentation, responded well to IV fluids and antibiotics. --BP now up to 112/44 --Hold home antihypertensive regimen  Dyslipidemia --Hold home  statin  Advanced dementia Patient with advanced dementia, now with diffuse large noncell Hodgkin's lymphoma in the setting of recent Covid-19 infection.  Patient is severely debilitated with recommendations of transitioning to hospice on previous hospitalization; although healthcare per return he/spouse wishes for aggressive measures continued.  ED physician spoke extensively with patient's son, and the remainder of the family other than the spouse wish to transition to comfort measures.  Attempted to contact patient's spouse, [name] unsuccessful. --Continue full code, antibiotics for now --Health care consulted for assistance with goals of care, medical decision-making, and family support --Ultimately very grim/poor prognosis  Stage IV sacral pressure ulcer, present on admission --Wound care consultation  Hx of COVID PNA --Completed course of remdesivir and Decadron --Repeat Covid-19 test was negative on 03/12/2019  Glaucoma: Continue eyedrops  Addendum 03/18/2019 at 10:04 AM. Discussed plan of care with patient's son, [name] and patient spouse [name] who is the HCPOA, regarding his illness and poor prognosis.  They wish to change his CODE STATUS to DNR at this time.  They wish to continue current treatment with antibiotics at this time, but no further escalation.  They were very appreciative of the update.  Patient spouse, [name] also requesting that their son, [name] be the primary contact person.   DVT prophylaxis: Lovenox Code Status: DNR Family Communication: Updated patient's son and spouse via telephone Disposition Plan: Anticipate discharge back to SNF versus hospice Consults called: Palliative care consulted via epic Admission status: Inpatient   Severity of Illness: The appropriate patient status for this patient is INPATIENT. Inpatient status is judged to be reasonable and necessary in order to provide the required intensity of service to ensure the patient's safety. The patient's  presenting symptoms, physical exam findings, and initial radiographic and laboratory data in the context of their chronic comorbidities is felt to place them at high risk for further clinical deterioration. Furthermore, it is not anticipated that the patient will be medically stable for discharge from the hospital within 2 midnights of admission. The following factors support the patient status of inpatient.   " The patient's presenting symptoms include hypoxia, shortness of breath, tachypnea " The worrisome physical exam findings include altered mental status, cachexia, shallow breathing, hypotension, tachycardia, tachypnea " The initial radiographic and  laboratory data are worrisome because of elevated WBC count, chest x-ray with mild faint residual opacities " The chronic co-morbidities include advanced dementia, history of recent Covid-19 pneumonia, recent diagnosis of non-Hodgkin's lymphoma.   * I certify that at the point of admission it is my clinical judgment that the patient will require inpatient hospital care spanning beyond 2 midnights from the point of admission due to high intensity of service, high risk for further deterioration and high frequency of surveillance required.*    Debbe Crumble J [place] DO [facility] Available via Epic secure chat 7am-7pm After these hours, please refer to coverage provider listed on [email or web address] 03/18/2019, 8:58 AM

## 2019-03-18 NOTE — Progress Notes (Signed)
Brief Nutrition Note  RD working remotely.  Consult received for enteral/tube feeding initiation and management.  Adult Enteral Nutrition Protocol initiated. Full assessment to follow.  Admitting Dx: SIRS (systemic inflammatory response syndrome) (HCC) [R65.10] Sepsis ([place]) [A41.9] Fever, unspecified fever cause [R50.9]  Body mass index is 25.34 kg/m.   Labs:  Recent Labs  Lab 03/12/19 0532 03/13/19 0449 03/14/19 0500 03/15/19 0438 03/18/19 0535  NA 126*   < > 128* 131* 135  K 4.0   < > 3.8 3.9 4.7  CL 94*   < > 97* 99 103  CO2 24   < > 24 21* 25  BUN 17   < > 14 15 28*  CREATININE 0.44*   < > 0.43* 0.30* 0.53*  CALCIUM 8.3*   < > 8.4* 8.4* 8.3*  MG 2.0  --   --   --   --   PHOS 2.6  --   --   --   --   GLUCOSE 121*   < > 119* 126* 114*   < > = values in this interval not displayed.   [name], RD, LDN, [place] Registered Dietitian II Certified Diabetes Care and Education Specialist Pager: [phone] After hours Pager: [phone]

## 2019-03-18 NOTE — Progress Notes (Signed)
Droplet precautions d/c per [place] Eric, DO.

## 2019-03-18 NOTE — Progress Notes (Addendum)
[place] Eric, MD paged regarding bladder scan volume of 637 ml. Verbal order given for straight cath.  Pt straight cathed with a volume of 900 ml.

## 2019-03-18 NOTE — Progress Notes (Signed)
A consult was received from an ED physician for cefepime/vancomycin per pharmacy dosing.  The patient's profile has been reviewed for ht/wt/allergies/indication/available labs.   A one time order has been placed for Cefepime 2 Gm and Vancomycin 1500 mg.  Further antibiotics/pharmacy consults should be ordered by admitting physician if indicated.                       Thank you, Dorrene German 03/18/2019  5:32 AM

## 2019-03-18 NOTE — Progress Notes (Signed)
Charge RN notified me "the [facility]" of this patient in the ED with bed request to 4E.  Ask to review if they should be on Covid isolation.  Patient was outside the 21 period of being considered contagious however had significant medical Hx that could potentially lead to a poor immune responce.  I reached out to Dr Spero Curb to review patient and he felt he could come off isolation.  This patient was also reviewed by Dr Johnnye Sima prior to his last discharge and was deemed he could come off isolation after 21 days but he discharged before coming off isolation.

## 2019-03-18 NOTE — Consult Note (Signed)
WOC Nurse Consult Note: Reason for Consult: Unstageable pressure injury to coccygeal area Wound type: Pressure plus moisture Pressure Injury POA: Yes Measurement:3cm x 1cm  Photo provided by MD this am. Wound bed: Nonviable tissue in wound bed Drainage (amount, consistency, odor) small amount light brown Periwound: intact, dry Dressing procedure/placement/frequency: A mattress replacement is ordered and patient positioning guidance is provided for Nursing. Bilateral pressure redistribution heel boots are ordered. Topical care will be with saline moistened gauze topped with dry gauze and covered with a silicone foam dressing. This will enhance debridement of the nonviable base and provide a moist environment for tissue repair.  It is noted that the patient has nutritional and perfusion deficiencies and that healing will be prolonged if not unachievable.  [place] nursing team will not follow, but will remain available to this patient, the nursing and medical teams.  Please re-consult if needed. Thanks, Maudie Flakes, MSN, RN, [place], Arther Abbott  Pager# [phone]

## 2019-03-18 NOTE — Progress Notes (Signed)
Pharmacy Antibiotic Note  [name] is a 86 y.o. male admitted on 03/18/2019 with sepsis.  Pharmacy has been consulted for vancomycin & cefepime dosing.  Known to pharmacy from recent admission s/p 5 days remdesivir for Covid PNA and s/p 14 days IV PCN for neurosyphilis. 1/24 CXR: mild faint residual opacities from recent Covid PNA 1/24 UA neg WBC 13.7. LA 1.5, SCr 0.53, T 101.1  Plan: Vancomycin 1500 mg loading dose Vancomycin 1250 mg IV Q 24 hrs. Goal AUC 400-550. Expected AUC: 463.8 SCr used: 0.8 Cefepime 2 gm IV q8h for CrCl ~ 65 w/ SCr rounded to 0.8 Flagyl 500 mg IV q8h F/u renal fxn, WBC, temp,culture data Vancomycin levels as needed  Temp (24hrs), Avg:101.1 F (38.4 C), Min:101.1 F (38.4 C), Max:101.1 F (38.4 C)  Recent Labs  Lab 03/12/19 0532 03/13/19 0449 03/14/19 0500 03/15/19 0438 03/18/19 0535  WBC 12.1*  --   --  15.7* 13.7*  CREATININE 0.44* 0.44* 0.43* 0.30* 0.53*  LATICACIDVEN  --   --   --   --  1.5    Estimated Creatinine Clearance: 57.6 mL/min (A) (by C-G formula based on SCr of 0.53 mg/dL (L)).    No Known Allergies   Antimicrobials this admission: 1/24 cefepime >>  1/24 vancomycin >>  1/24 flagyl>> PTA: 1/8 PCN >>1/22 - completed 14 days for neurosyphilis 1/1 Remdesivir completed 1/5 Dose adjustments this admission:  Microbiology results: 1/24 BCx2: sent 1/24 UCx: sent 1/18 Covid neg  Thank you for allowing pharmacy to be a part of this patient's care . Eudelia Bunch, Pharm.D [phone] 03/18/2019 9:33 AM

## 2019-03-18 NOTE — ED Provider Notes (Signed)
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  Spoke with patient's nurse at [facility] who states that patient appeared to be in distress tonight and was gurgling, had white sputum production, sats of 74% on room air.  Does not wear oxygen chronically.  Placed on nonrebreather and sats were in the 80s.  He was admitted to the hospital 12/31 until 1/22.  Was hypoxic with Covid pneumonia but this improved and was not discharged to [facility] on oxygen.  Covid positive on 12/31.  Had repeat Covid test on 1/18 that was negative.  Hospitalist team recommended palliative care to discuss goals of care, CODE STATUS with family.  They wanted to keep patient full code and he was discharged to SNF for rehabilitation prior to determining future cancer treatments.  ROS: Level 5 caveat secondary to dementia  PAST MEDICAL HISTORY/PAST SURGICAL HISTORY:  Past Medical History:  Diagnosis Date  . Allergic rhinitis   . BPH (benign prostatic hypertrophy) with urinary obstruction 2000   40gm, L lobe 64mm prostate nodule; followed yearly by Dr Diona Fanti  . GERD (gastroesophageal reflux disease)   . Glaucoma   . Herpes zoster ophthalmicus    history  . History of kidney stones remote   x 1-Ca monhydrate, hyperoxalate urine s/p lithotripsy  . HLD (hyperlipidemia)   . HTN (hypertension)   . Impaired glucose tolerance   . Impaired hearing    uses hearing aides bilaterally  . Osteopenia     MEDICATIONS:  Prior to Admission medications   Medication Sig Start Date End Date Taking? Authorizing Provider  aspirin EC 81 MG tablet Take 81 mg by mouth daily.    Yes [provider]  atorvastatin (LIPITOR) 10 MG tablet Take 0.5 tablets (5 mg total) by mouth daily. 04/20/18  Yes [name], MD  AZOPT 1 % ophthalmic suspension Place 1 drop into both eyes 3 (three) times daily. 02/20/19  Yes [provider]  donepezil (ARICEPT) 10 MG tablet Take 1 tablet (10 mg total) by mouth at bedtime. 04/20/18  Yes [name], MD  Multiple Vitamin (MULTIVITAMIN WITH MINERALS) TABS tablet Take 1 tablet by mouth daily. 03/17/19  Yes Lucky Cowboy, MD  Nutritional Supplements (FEEDING SUPPLEMENT, OSMOLITE 1.2 CAL,) LIQD Place 1,000 mLs into feeding tube continuous. Initiate at 20 ml/hr and advance by 10 ml every 4 hours to reach 40 ml/hr 03/16/19  Yes Lucky Cowboy, MD  polyethylene glycol (MIRALAX / GLYCOLAX) 17 g packet Take 17 g by mouth daily.   Yes [provider]  QUEtiapine (SEROQUEL) 25 MG tablet Take 1 tablet (25 mg total) by mouth at bedtime. 03/16/19  Yes Lucky Cowboy, MD  senna-docusate (SENOKOT-S) 8.6-50 MG tablet Take 1 tablet by mouth 2 (two) times daily. 03/16/19  Yes Lucky Cowboy, MD  sodium chloride flush (NS) 0.9 % SOLN 10-40 mLs by Intracatheter route as needed (flush). 03/16/19  Yes Lucky Cowboy, MD  VYZULTA 0.024 % SOLN Place 1 drop into both eyes at bedtime. 02/20/19  Yes [provider]  acetaminophen (TYLENOL) 500 MG tablet Take 1 tablet (500 mg total) by mouth every 8 (eight) hours as needed for mild pain or moderate pain. 03/16/19  Lucky Cowboy, MD  lactulose [facility]) 10 GM/15ML SOLN enema Place 300 mLs rectally 2 (two) times daily as needed for severe constipation. 03/16/19   Lucky Cowboy, MD  lactulose (CHRONULAC) 10 GM/15ML solution Take 45 mLs (30 g total) by mouth 2 (two) times daily as needed for mild constipation. Patient not taking: Reported on 03/18/2019 03/16/19   Lucky Cowboy, MD  polyethylene glycol powder [facility]) 17 GM/SCOOP powder Take 8.5-17 g by mouth daily. Hold for  diarrhea Patient not taking: Reported on 03/18/2019 02/08/19   [name], MD    ALLERGIES:  No Known Allergies  SOCIAL HISTORY:  Social History   Tobacco Use  . Smoking status: Former Smoker    Types: Cigarettes  . Smokeless tobacco: Never Used  Substance Use Topics  . Alcohol use: No    FAMILY HISTORY: Family History  Problem Relation Age of Onset  . Coronary artery disease Father   . Healthy Mother   . Diabetes Brother   . Breast cancer Sister   . Leukemia Maternal Uncle   . Pancreatic cancer Sister     EXAM: BP (!) 128/113 (BP Location: Right Arm)   Pulse (!) 106   Temp (!) 101.1 F (38.4 C) (Rectal)   Resp (!) 26   SpO2 93%  CONSTITUTIONAL: Alert but does not answer questions or follow commands, thin, frail, elderly, chronically ill-appearing HEAD: Normocephalic EYES: Conjunctivae clear, pupils appear equal, EOM appear intact ENT: normal nose; moist mucous membranes NECK: Supple, normal ROM CARD: Regular and tachycardic; S1 and S2 appreciated; no murmurs, no clicks, no rubs, no gallops RESP: Rhonchorous breath sounds, patient taken off nonrebreather and is no longer hypoxic on room air, tachypneic, increased work of breathing ABD/GI: Normal bowel sounds; non-distended; soft, non-tender, no rebound, no guarding, no peritoneal signs, PEG tube in place without surrounding redness or warmth BACK:  The back appears normal, patient has stage II sacral decubitus ulcer with small amount of surrounding erythema without induration or drainage RECTAL: Brown stool without blood or melena EXT: Normal ROM in all joints; no deformity noted, no edema; no cyanosis SKIN: Normal color for age and race; warm; no rash on exposed skin NEURO: Moves all extremities equally   MEDICAL DECISION MAKING: Patient here with concerns for sepsis.  Febrile, tachycardic, tachypneic.  Recently diagnosed with Covid pneumonia.  Found to be hypoxic in respiratory distress at his nursing  facility this morning.  Not hypoxic currently on room air.  He is however tachypneic with rhonchorous breath sounds.  Will obtain labs, cultures, urine, chest x-ray.  Will give IV fluids, Tylenol, broad-spectrum antibiotics.  ED PROGRESS: Labs showed leukocytosis of 13.7 with left shift.  Lactate normal at 1.5.  Urine does not appear infected.  Chest x-ray pending.  Patient has not had any hypoxia here.  Currently doing well on room air.  Spoke with patient's son [name] who states that himself and his sister would like for patient to be DNR/DNI but their mother, patient's wife [name] is who wants to keep the patient a full code.  He understands that patient is quite ill today.  He understands that he may not survive this hospitalization.  He reports he will talk to his mother today.  I have attempted to call [name] at home without any answer.  [name] does report that his father has severe dementia and is very hard of hearing.  During his hospitalization he had decreased responsiveness that did slowly improve prior to discharge.  7:37  AM Discussed patient's case with hospitalist, Dr. [place]  I have recommended admission and patient (and family if present) agree with this plan. Admitting physician will place admission orders.   Agrees that patient should be comfort care.  Will consult palliative care.  I reviewed all nursing notes, vitals, pertinent previous records and interpreted all EKGs, lab and urine results, imaging (as available).    EKG Interpretation  Date/Time:  Sunday March 18 2019 05:14:25 EST Ventricular Rate:  106 PR Interval:    QRS Duration: 83 QT Interval:  337 QTC Calculation: 448 R Axis:   62 Text Interpretation: Sinus tachycardia Probable left atrial enlargement Low voltage, extremity leads ST elevation, consider inferior injury No significant change since last tracing Confirmed by Pryor Curia [phone]) on 03/18/2019 5:24:44 AM        CRITICAL CARE Performed by: Cyril Mourning  Timberlee Roblero   Total critical care time: 55 minutes  Critical care time was exclusive of separately billable procedures and treating other patients.  Critical care was necessary to treat or prevent imminent or life-threatening deterioration.  Critical care was time spent personally by me on the following activities: development of treatment plan with patient and/or surrogate as well as nursing, discussions with consultants, evaluation of patient's response to treatment, examination of patient, obtaining history from patient or surrogate, ordering and performing treatments and interventions, ordering and review of laboratory studies, ordering and review of radiographic studies, pulse oximetry and re-evaluation of patient's condition.   JAKYRIE SIMBECK was evaluated in Emergency Department on 03/18/2019 for the symptoms described in the history of present illness. He was evaluated in the context of the global COVID-19 pandemic, which necessitated consideration that the patient might be at risk for infection with the SARS-CoV-2 virus that causes COVID-19. Institutional protocols and algorithms that pertain to the evaluation of patients at risk for COVID-19 are in a state of rapid change based on information released by regulatory bodies including the CDC and federal and state organizations. These policies and algorithms were followed during the patient's care in the ED.  Patient was seen wearing N95, face shield, gloves.    Hoy Fallert, Delice Bison, DO 03/18/19 [phone]

## 2019-03-18 NOTE — ED Notes (Signed)
Attempted report x2.  Per floor RN, [facility] is reviewing pt chart to clarify if pt is acceptable for unit.

## 2019-03-18 NOTE — Progress Notes (Signed)
[place] Eric, MD paged regarding CBG of 89 per order.

## 2019-03-18 NOTE — ED Notes (Signed)
Attempted report x1. 

## 2019-03-18 NOTE — ED Triage Notes (Addendum)
Pt comes to to ed via ems, c/o sob and respiratory distress per facility.  Pt is altered not speaking, responds to pain. Pt comes from [facility] extensive medical hx.  18 in Left forearm, nacl 500 in. Triage in process   peg tube  picc line right arm  Wound noted on bottom

## 2019-03-18 NOTE — ED Notes (Signed)
This RN spoke with son, Leshun Stenglein, regarding pts code status.  This RN informed family that admitting provider would be required to speak to pts wife regarding this matter, admitting provider [place] Nature conservation officer.

## 2019-03-19 ENCOUNTER — Telehealth: Payer: Self-pay

## 2019-03-19 ENCOUNTER — Other Ambulatory Visit: Payer: Self-pay

## 2019-03-19 DIAGNOSIS — C8330 Diffuse large B-cell lymphoma, unspecified site: Secondary | ICD-10-CM

## 2019-03-19 LAB — CBC
HCT: 30.6 % — ABNORMAL LOW (ref 39.0–52.0)
Hemoglobin: 9.8 g/dL — ABNORMAL LOW (ref 13.0–17.0)
MCH: 31.3 pg (ref 26.0–34.0)
MCHC: 32 g/dL (ref 30.0–36.0)
MCV: 97.8 fL (ref 80.0–100.0)
Platelets: 287 10*3/uL (ref 150–400)
RBC: 3.13 MIL/uL — ABNORMAL LOW (ref 4.22–5.81)
RDW: 14.7 % (ref 11.5–15.5)
WBC: 14.6 10*3/uL — ABNORMAL HIGH (ref 4.0–10.5)
nRBC: 0 % (ref 0.0–0.2)

## 2019-03-19 LAB — COMPREHENSIVE METABOLIC PANEL
ALT: 17 U/L (ref 0–44)
AST: 18 U/L (ref 15–41)
Albumin: 1.7 g/dL — ABNORMAL LOW (ref 3.5–5.0)
Alkaline Phosphatase: 50 U/L (ref 38–126)
Anion gap: 5 (ref 5–15)
BUN: 22 mg/dL (ref 8–23)
CO2: 23 mmol/L (ref 22–32)
Calcium: 8 mg/dL — ABNORMAL LOW (ref 8.9–10.3)
Chloride: 107 mmol/L (ref 98–111)
Creatinine, Ser: 0.4 mg/dL — ABNORMAL LOW (ref 0.61–1.24)
GFR calc Af Amer: >60 mL/min (ref >60)
GFR calc non Af Amer: >60 mL/min (ref >60)
Glucose, Bld: 118 mg/dL — ABNORMAL HIGH (ref 70–99)
Potassium: 3.4 mmol/L — ABNORMAL LOW (ref 3.5–5.1)
Sodium: 135 mmol/L (ref 135–145)
Total Bilirubin: 0.8 mg/dL (ref 0.3–1.2)
Total Protein: 4.7 g/dL — ABNORMAL LOW (ref 6.5–8.1)

## 2019-03-19 LAB — GLUCOSE, CAPILLARY
Glucose-Capillary: 108 mg/dL — ABNORMAL HIGH (ref 70–99)
Glucose-Capillary: 110 mg/dL — ABNORMAL HIGH (ref 70–99)
Glucose-Capillary: 119 mg/dL — ABNORMAL HIGH (ref 70–99)
Glucose-Capillary: 97 mg/dL (ref 70–99)
Glucose-Capillary: 99 mg/dL (ref 70–99)

## 2019-03-19 LAB — URINE CULTURE: Culture: NO GROWTH

## 2019-03-19 LAB — PROCALCITONIN: Procalcitonin: 0.11 ng/mL

## 2019-03-19 MED ORDER — OSMOLITE 1.5 CAL PO LIQD
1000.0000 mL | ORAL | Status: DC
  Administered 2019-03-19 – 2019-03-25 (×8): 1000 mL
  Filled 2019-03-19 (×12): qty 1000

## 2019-03-19 MED ORDER — POTASSIUM CHLORIDE 20 MEQ PO PACK: 20.0000 meq | PACK | Freq: Two times a day (BID) | ORAL | Status: DC

## 2019-03-19 MED ORDER — CHLORHEXIDINE GLUCONATE 0.12 % MT SOLN
15.0000 mL | Freq: Two times a day (BID) | OROMUCOSAL | Status: DC
  Administered 2019-03-19 – 2019-03-27 (×15): 15 mL via OROMUCOSAL
  Filled 2019-03-19 (×14): qty 15

## 2019-03-19 MED ORDER — POTASSIUM CHLORIDE 20 MEQ PO PACK
20.0000 meq | PACK | Freq: Two times a day (BID) | ORAL | Status: AC
  Administered 2019-03-19 – 2019-03-20 (×4): 20 meq
  Filled 2019-03-19 (×4): qty 1

## 2019-03-19 MED ORDER — OSMOLITE 1.2 CAL PO LIQD
1000.0000 mL | ORAL | Status: DC
  Filled 2019-03-19: qty 1000

## 2019-03-19 MED ORDER — FREE WATER
100.0000 mL | Freq: Four times a day (QID) | Status: DC
  Administered 2019-03-19 – 2019-03-26 (×26): 100 mL

## 2019-03-19 MED ORDER — LIP MEDEX EX OINT
TOPICAL_OINTMENT | CUTANEOUS | Status: DC | PRN
  Filled 2019-03-19: qty 7

## 2019-03-19 MED ORDER — PRO-STAT SUGAR FREE PO LIQD
30.0000 mL | Freq: Every day | ORAL | Status: DC
  Administered 2019-03-19 – 2019-03-26 (×8): 30 mL
  Filled 2019-03-19 (×8): qty 30

## 2019-03-19 MED ORDER — ORAL CARE MOUTH RINSE
15.0000 mL | Freq: Two times a day (BID) | OROMUCOSAL | Status: DC
  Administered 2019-03-19 – 2019-03-26 (×16): 15 mL via OROMUCOSAL

## 2019-03-19 NOTE — Progress Notes (Signed)
[facility] ([facility]) Community Based Palliative Care  °   °This patient is newly enrolled in our palliative care services in the community.  [facility] will continue to follow for any discharge planning needs and to coordinate continuation of palliative care.  ° °If you have questions or need assistance, please call [phone] or contact the hospital Liaison listed on AMION.   °   °Mary Anne Robertson, RN, CCM  °[facility] Hospital Liaison  °[phone]  °

## 2019-03-19 NOTE — Progress Notes (Signed)
Initial Nutrition Assessment  DOCUMENTATION CODES:   Not applicable  INTERVENTION:  - will adjust TF regimen: Osmolite 1.5 @ 35 ml/hr advance by 10 ml every 4 hours to goal rate of 55 ml/hr with 30 ml prostat once/day and 100 ml free water QID. - at goal rate, this regimen will provide 2080 kcal, 98 grams protein, and 1406 ml free water.    NUTRITION DIAGNOSIS:   Increased nutrient needs related to acute illness, cancer and cancer related treatments as evidenced by estimated needs.  GOAL:   Patient will meet greater than or equal to 90% of their needs  MONITOR:   TF tolerance, Labs, Weight trends, Skin  REASON FOR ASSESSMENT:   Consult Enteral/tube feeding initiation and management  ASSESSMENT:   86 y.o. male with medical history of dementia, HTN, hyperlipidemia, GERD, and BPH. He presented to the ED from SNF with AMS and respiratory distress. Nursing staff at SNF reported that patient appeared to be in moderate distress and was gurgling with white sputum production and oxygen saturations 74% on room air; he does not wear oxygen at baseline. EMS was called and he was placed on a non-rebreather. Patient was hospitalized 12/31-1/22 due to COVID-19 PNA and questionable neurosyphilis. He was also diagnosed with B-cell non-Hodgkin's lymphoma during that hospitalization.  Patient has been NPO since admission. He is a/o to self only but sleeping at the time of visit and did not awake, even during NFPE. No family or visitors present.   Patient has PEG which was placed during recent admission and he is currently receiving TF per protocol: Osmolite 1.2 @ 50 ml/hr. This regimen provides 1440 kcal, 67 grams protein, and 984 ml free water. Will adjust regimen as outlined above to better meet needs.  Per chart review, weight on 1/24 was 157 lb and weight today is 160 lb. Weight on 02/05/19 was 164 lb. This indicates 7 lb weight loss (4.2% body weight) in 5 weeks; not significant for time  frame.  Per notes: - sepsis - diffuse large cell, B-cell non-Hodgkin's lymphoma--Palliative Care consulted - dyslipidemia--home statin being held - advanced dementia - stage 4 sacral pressure injury (other documentation indicates this is a stage 2, will continue to monitor) - recent COVID-19--test on 1/18 was negative   Labs reviewed; CBGs: 110, 99, and 119 mg/dl, K: 3.4 mmol/l, creatinine: 0.4 mg/dl, Ca: 8 mg/dl. Medications reviewed; 20 mEq Klor-Con per tube BID.  IVF; NS @ 100 ml/hr.     NUTRITION - FOCUSED PHYSICAL EXAM:  completed; no muscle or fat wasting, mild edema to BLE.   Diet Order:   Diet Order            Diet NPO time specified  Diet effective now              EDUCATION NEEDS:   No education needs have been identified at this time  Skin:  Skin Integrity Issues:: Stage II Stage II: coccyx  Last BM:  1/25  Height:   Ht Readings from Last 1 Encounters:  03/19/19 5\' 6"  (1.676 m)    Weight:   Wt Readings from Last 1 Encounters:  03/19/19 72.4 kg    Ideal Body Weight:  64.5 kg  BMI:  Body mass index is 25.76 kg/m.  Estimated Nutritional Needs:   Kcal:  1955-2175 kcal  Protein:  85-95 grams  Fluid:  >/= 2 L/day     Jarome Matin, MS, RD, LDN, CNSC Inpatient Clinical Dietitian Pager # [phone] After hours/weekend pager #  [phone]  

## 2019-03-19 NOTE — Telephone Encounter (Signed)
Patient's daughter, Neoma Laming, called to cancel upcoming appointments due to patient being admitted into hospital.  Scheduling message sent to have rescheduled, daughter aware.

## 2019-03-19 NOTE — Progress Notes (Signed)
PROGRESS NOTE    [name]  [id number] DOB: 04-Oct-1928 DOA: 03/18/2019 PCP: [name], MD    Brief Narrative:  86 year old male with dementia, hypertension, hyperlipidemia, GERD and BPH from a skilled nursing facility presented with altered mental status and difficulty breathing. Recent extensive hospitalizations, 02/22/2019-02/2018 for COVID-19 pneumonia, neurosyphilis, extensive treatment with remdesivir, dexamethasone, IV penicillin for 2 weeks, also diagnosed with B-cell non-Hodgkin's lymphoma.  PEG tube placement and discharged to nursing home on 1/22 and presented back on 1/24 with above. In the emergency room temperature one 1.1 tachycardic, respiratory 27.  Blood pressure is low normal.  Oxygen 96% on room air.  WBC 13.7.  Urinalysis unremarkable.  Chest x-ray with residual opacities consistent with recent COVID-19 pneumonia.  Admitted with sepsis protocol from unknown source.   Assessment & Plan:   Principal Problem:   Sepsis ([place]) Active Problems:   Dyslipidemia   Glaucoma   Essential hypertension   Advanced dementia ([place])   Diffuse large cell non-Hodgkin's lymphoma ([place])   Stage IV pressure ulcer of sacral region ([place])  Sepsis present on admission: Primary source unknown. Recent hospitalization. PICC line in place.  Also questionable aspiration pneumonia.  Metabolic fever from non-Hodgkin's lymphoma also possible. Blood pressures are stabilizing.  Blood cultures pending.  Urine culture pending.  Procalcitonin normal. Patient is started on broad-spectrum antibiotics, will continue until clinical improvement.  Stage IV sacral decubitus ulcer: Present on admission.  Local wound care.  Less likely source of infection.  Diffuse large cell non-Hodgkin's lymphoma: Followed by oncology.  Less likely a candidate for any treatment given severe debility.  Advanced dementia/failure to thrive/dysphagia: Recently received PEG tube.  Medication through the mouth with  applesauce.  Resume tube feeding to the goal. DNR. Family still hoping for good functional outcome and improvement. Allergy consulted.  History of COVID-19 pneumonia: Completed treatment.  Outside of isolation window.  Hypertension: Blood pressures low on presentation.  Hold all antihypertensives for now.  Hypokalemia: We will replace and monitor levels.   DVT prophylaxis: Lovenox subcu Code Status: DNR Family Communication: We will call patient's son Disposition Plan: patient is from a skilled nursing facility. Anticipated DC to skilled nursing facility, Barriers to discharge treated for active infection and sepsis.   Consultants:   Palliative medicine  Oncology  Procedures:   None  Antimicrobials:  Anti-infectives (From admission, onward)   Start     Dose/Rate Route Frequency Ordered Stop   03/19/19 0800  vancomycin (VANCOREADY) IVPB 1250 mg/250 mL     1,250 mg 166.7 mL/hr over 90 Minutes Intravenous Every 24 hours 03/18/19 0925     03/18/19 1400  metroNIDAZOLE (FLAGYL) IVPB 500 mg     500 mg 100 mL/hr over 60 Minutes Intravenous Every 8 hours 03/18/19 0828     03/18/19 1400  ceFEPIme (MAXIPIME) 2 g in sodium chloride 0.9 % 100 mL IVPB     2 g 200 mL/hr over 30 Minutes Intravenous Every 8 hours 03/18/19 0928     03/18/19 0830  metroNIDAZOLE (FLAGYL) IVPB 500 mg  Status:  Discontinued     500 mg 100 mL/hr over 60 Minutes Intravenous Every 8 hours 03/18/19 0827 03/18/19 0828   03/18/19 0615  vancomycin (VANCOREADY) IVPB 1500 mg/300 mL     1,500 mg 150 mL/hr over 120 Minutes Intravenous  Once 03/18/19 0530 03/18/19 0958   03/18/19 0530  ceFEPIme (MAXIPIME) 2 g in sodium chloride 0.9 % 100 mL IVPB     2 g 200 mL/hr over  30 Minutes Intravenous  Once 03/18/19 0526 03/18/19 0648   03/18/19 0530  metroNIDAZOLE (FLAGYL) IVPB 500 mg     500 mg 100 mL/hr over 60 Minutes Intravenous  Once 03/18/19 0526 03/18/19 0749   03/18/19 0530  vancomycin (VANCOCIN) IVPB 1000 mg/200 mL  premix  Status:  Discontinued     1,000 mg 200 mL/hr over 60 Minutes Intravenous  Once 03/18/19 0526 03/18/19 0530         Subjective: Patient seen and examined.  No overnight events.  Blood pressure responding to fluid.  Remains afebrile. He was not able to offer any complaints. He just said "your hands feel cold".  Nothing else.  Did not follow other commands.  Objective: Vitals:   03/18/19 2019 03/19/19 0437 03/19/19 0439 03/19/19 0500  BP: 123/62 (!) 84/51 (!) 101/57   Pulse: 75 78 78   Resp: 20 20    Temp: 98.8 F (37.1 C) 97.9 F (36.6 C)    TempSrc: Oral Oral    SpO2: 100% 98% 98%   Weight:    72.4 kg  Height:    5\' 6"  (1.676 m)    Intake/Output Summary (Last 24 hours) at 03/19/2019 1124 Last data filed at 03/19/2019 1000 Gross per 24 hour  Intake 3655.45 ml  Output 2119 ml  Net 1536.45 ml   Filed Weights   03/18/19 0952 03/19/19 0500  Weight: 71.2 kg 72.4 kg    Examination:  General exam: Appears calm and comfortable, chronically sick looking.  On room air. Respiratory system: Clear to auscultation. Respiratory effort normal.  No added sounds. Cardiovascular system: S1 & S2 heard, RRR.  No pedal edema. Gastrointestinal system: Abdomen is nondistended, soft and nontender. No organomegaly or masses felt. Normal bowel sounds heard.  PEG tube in place infusing. Central nervous system: Alert and awake but not oriented.  No focal neurological deficits. Skin: No rashes, lesions or ulcers, stage IV sacral decubitus present on admission.  Picture in the media section. Psychiatry: Judgement and insight appear compromised.  Mood & affect flat.    Data Reviewed: I have personally reviewed following labs and imaging studies  CBC: Recent Labs  Lab 03/15/19 0438 03/18/19 0535 03/19/19 0329  WBC 15.7* 13.7* 14.6*  NEUTROABS  --  8.3*  --   HGB 12.2* 11.9* 9.8*  HCT 36.5* 37.2* 30.6*  MCV 94.8 97.4 97.8  PLT 235 291 [id number]   Basic Metabolic Panel: Recent Labs    Lab 03/13/19 0449 03/14/19 0500 03/15/19 0438 03/18/19 0535 03/19/19 0329  NA 127* 128* 131* 135 135  K 3.9 3.8 3.9 4.7 3.4*  CL 95* 97* 99 103 107  CO2 24 24 21* 25 23  GLUCOSE 106* 119* 126* 114* 118*  BUN 14 14 15  28* 22  CREATININE 0.44* 0.43* 0.30* 0.53* 0.40*  CALCIUM 8.3* 8.4* 8.4* 8.3* 8.0*   GFR: Estimated Creatinine Clearance: 57.6 mL/min (A) (by C-G formula based on SCr of 0.4 mg/dL (L)). Liver Function Tests: Recent Labs  Lab 03/18/19 0535 03/19/19 0329  AST 29 18  ALT 21 17  ALKPHOS 64 50  BILITOT 0.6 0.8  PROT 5.9* 4.7*  ALBUMIN 2.2* 1.7*   No results for input(s): LIPASE, AMYLASE in the last 168 hours. No results for input(s): AMMONIA in the last 168 hours. Coagulation Profile: Recent Labs  Lab 03/18/19 0535  INR 1.2   Cardiac Enzymes: No results for input(s): CKTOTAL, CKMB, CKMBINDEX, TROPONINI in the last 168 hours. BNP (last 3 results) No results  for input(s): PROBNP in the last 8760 hours. HbA1C: No results for input(s): HGBA1C in the last 72 hours. CBG: Recent Labs  Lab 03/18/19 1641 03/18/19 2044 03/19/19 0005 03/19/19 0440 03/19/19 0803  GLUCAP 89 79 110* 99 119*   Lipid Profile: No results for input(s): CHOL, HDL, LDLCALC, TRIG, CHOLHDL, LDLDIRECT in the last 72 hours. Thyroid Function Tests: Recent Labs    03/18/19 1012  TSH 1.599   Anemia Panel: No results for input(s): VITAMINB12, FOLATE, FERRITIN, TIBC, IRON, RETICCTPCT in the last 72 hours. Sepsis Labs: Recent Labs  Lab 03/18/19 0535 03/18/19 1012 03/19/19 0329  PROCALCITON  --  <0.10 0.11  LATICACIDVEN 1.5  --   --     Recent Results (from the past 240 hour(s))  SARS CORONAVIRUS 2 (TAT 6-24 HRS) Nasopharyngeal Nasopharyngeal Swab     Status: None   Collection Time: 03/12/19 12:40 PM   Specimen: Nasopharyngeal Swab  Result Value Ref Range Status   SARS Coronavirus 2 NEGATIVE NEGATIVE Final    Comment: (NOTE) SARS-CoV-2 target nucleic acids are NOT  DETECTED. The SARS-CoV-2 RNA is generally detectable in upper and lower respiratory specimens during the acute phase of infection. Negative results do not preclude SARS-CoV-2 infection, do not rule out co-infections with other pathogens, and should not be used as the sole basis for treatment or other patient management decisions. Negative results must be combined with clinical observations, patient history, and epidemiological information. The expected result is Negative. Fact Sheet for Patients: SugarRoll.be Fact Sheet for Healthcare Providers: https://www.woods-mathews.com/ This test is not yet approved or cleared by the [place] FDA and  has been authorized for detection and/or diagnosis of SARS-CoV-2 by FDA under an Emergency Use Authorization (EUA). This EUA will remain  in effect (meaning this test can be used) for the duration of the COVID-19 declaration under Section 56 4(b)(1) of the Act, 21 U.S.C. section 360bbb-3(b)(1), unless the authorization is terminated or revoked sooner. Performed at [facility], [place] [address], [place], [place] [address]   Culture, blood (Routine x 2)     Status: None (Preliminary result)   Collection Time: 03/18/19  5:35 AM   Specimen: BLOOD RIGHT HAND  Result Value Ref Range Status   Specimen Description   Final    BLOOD RIGHT HAND Performed at [facility] [address], [place], [place] [address]    Special Requests   Final    BOTTLES DRAWN AEROBIC AND ANAEROBIC Blood Culture results may not be optimal due to an inadequate volume of blood received in culture bottles Performed at [facility] [address], [place], [place] [address]    Culture   Final    NO GROWTH < 24 HOURS Performed at [facility] [address], [place], [place] [address]    Report Status PENDING  Incomplete  Culture, blood (Routine x 2)     Status: None (Preliminary  result)   Collection Time: 03/18/19  5:35 AM   Specimen: BLOOD LEFT FOREARM  Result Value Ref Range Status   Specimen Description   Final    BLOOD LEFT FOREARM Performed at [facility] [address] [place], [place] [address]    Special Requests   Final    BOTTLES DRAWN AEROBIC AND ANAEROBIC Blood Culture adequate volume Performed at [facility] [address] [place], [place] [address]    Culture   Final    NO GROWTH < 24 HOURS  Performed at [facility], [place] [address], [place], [place] [address]    Report Status PENDING  Incomplete  Urine culture     Status: None   Collection Time: 03/18/19  5:35 AM   Specimen: Urine, Random  Result Value Ref Range Status   Specimen Description   Final    URINE, RANDOM Performed at [facility] [address], [place], [place] [address]    Special Requests   Final    NONE Performed at [facility], [place] [address], [place], [place] [address]    Culture   Final    NO GROWTH Performed at [facility], [place] [address] [place], [place] [address]    Report Status 03/19/2019 FINAL  Final  MRSA PCR Screening     Status: None   Collection Time: 03/18/19  9:46 AM   Specimen: Nasal Mucosa; Nasopharyngeal  Result Value Ref Range Status   MRSA by PCR NEGATIVE NEGATIVE Final    Comment:        The GeneXpert MRSA Assay (FDA approved for NASAL specimens only), is one component of a comprehensive MRSA colonization surveillance program. It is not intended to diagnose MRSA infection nor to guide or monitor treatment for MRSA infections. Performed at [facility], [place] [address] [place], [place] [address]          Radiology Studies: DG Abd 1 View  Result Date: 03/18/2019 CLINICAL DATA:  Sepsis. EXAM: ABDOMEN - 1 VIEW COMPARISON:  03/14/2019 FINDINGS: Single view of the abdomen demonstrates a gastrostomy tube. Dextroscoliosis of  the lumbar spine with the apex at L2-L3. Again noted is a large amount of bowel gas throughout the abdomen. Gas in small and large bowel loops. Evidence for stool in the rectum. Cannot evaluate for free air on this supine portable view. IMPRESSION: 1. Nonobstructive bowel gas pattern.  Stool in the rectum. 2. Gastrostomy tube. Electronically Signed   By: Markus Daft M.D.   On: 03/18/2019 10:19   DG Chest Port 1 View  Result Date: 03/18/2019 CLINICAL DATA:  Shortness of breath, respiratory distress EXAM: PORTABLE CHEST 1 VIEW COMPARISON:  CT chest dated 02/24/2019 FINDINGS: Mild faint residual opacities in the central right upper lobe and bilateral lower lobes. Eventration of right hemidiaphragm. No pleural effusion or pneumothorax. The heart is normal in size.  Thoracic aortic atherosclerosis. IMPRESSION: Mild faint residual pulmonary opacities in this patient with recent COVID pneumonia. Electronically Signed   By: Julian Hy M.D.   On: 03/18/2019 07:40        Scheduled Meds:  brinzolamide  1 drop Both Eyes TID   Chlorhexidine Gluconate Cloth  6 each Topical Daily   enoxaparin (LOVENOX) injection  40 mg Subcutaneous Q24H   feeding supplement (PRO-STAT SUGAR FREE 64)  30 mL Per Tube Daily   free water  100 mL Per Tube Q6H   latanoprost  1 drop Both Eyes QHS   potassium chloride  20 mEq Per Tube BID   sodium chloride flush  10-40 mL Intracatheter Q12H   Continuous Infusions:  sodium chloride 100 mL/hr at 03/19/19 0826   sodium chloride Stopped (03/18/19 2252)   ceFEPime (MAXIPIME) IV 2 g (03/19/19 0543)   feeding supplement (OSMOLITE 1.2 CAL) 50 mL/hr at 03/19/19 0255   feeding supplement (OSMOLITE 1.5 CAL)     metronidazole 500 mg (03/19/19 0545)   vancomycin 1,250 mg (03/19/19 0827)     LOS: 1 day  Time spent: 30 minutes     Barb Merino, MD [facility] Pager [phone]

## 2019-03-19 NOTE — Plan of Care (Signed)
  Problem: Clinical Measurements: Goal: Diagnostic test results will improve Outcome: Progressing Goal: Signs and symptoms of infection will decrease Outcome: Progressing   Problem: Fluid Volume: Goal: Hemodynamic stability will improve Outcome: Progressing   Problem: Respiratory: Goal: Ability to maintain adequate ventilation will improve Outcome: Progressing

## 2019-03-19 NOTE — Consult Note (Signed)
   [facility] CM Inpatient Consult   03/19/2019  [name] 03/28/1928 [id number]  Patient chart has been reviewed for readmissions less than 30 days and for high risk score, 31%, for unplanned readmissions.  Patient assessed for community [place] Management follow up needs.  Chart review reveals patient current disposition plan is for SNF. No [facility] CM identifiable needs.  Netta Cedars, MSN, [facility] Liaison Nurse Mobile Phone [phone]  Toll free office [phone]

## 2019-03-19 NOTE — Progress Notes (Signed)
[name]   DOB:June 15, 1928   [id number]   [id number]  Subjective:  [name] was discharged 01/22 and according to his son was doing well 24 hrs later--"he was talking, moving more." The next day, yesterday 01/24, he was found to be SOB and febrile and was readmitted. Goals of Care were rediscussed with family and patient now has a DNR order. He was started on ABX and supportive measures were continued. A paliative care consult is pending.   Objective: elderly White man examined in bed Vitals:   03/19/19 0437 03/19/19 0439  BP: (!) 84/51 (!) 101/57  Pulse: 78 78  Resp: 20   Temp: 97.9 F (36.6 C)   SpO2: 98% 98%    Body mass index is 25.76 kg/m.  Intake/Output Summary (Last 24 hours) at 03/19/2019 0819 Last data filed at 03/19/2019 0618 Gross per 24 hour  Intake 1807.07 ml  Output 2119 ml  Net -311.93 ml     No palpable supraclavicular adenopathy  Lungs no rales or wheezes--auscultated anterolaterally  Heart regular rate and rhythm  Abdomen soft, +BS  Neuro: asleep, not arousing to voice or touch   CBG (last 3)  Recent Labs    03/19/19 0005 03/19/19 0440 03/19/19 0803  GLUCAP 110* 99 119*     Labs:  Lab Results  Component Value Date   WBC 14.6 (H) 03/19/2019   HGB 9.8 (L) 03/19/2019   HCT 30.6 (L) 03/19/2019   MCV 97.8 03/19/2019   PLT 287 03/19/2019   NEUTROABS 8.3 (H) 03/18/2019    @LASTCHEMISTRY @  Urine Studies No results for input(s): UHGB, CRYS in the last 72 hours.  Invalid input(s): UACOL, UAPR, USPG, UPH, UTP, UGL, UKET, UBIL, UNIT, UROB, ULEU, UEPI, UWBC, [place], [place], [place], [place], [place]  Basic Metabolic Panel: Recent Labs  Lab 03/13/19 0449 03/13/19 0449 03/14/19 0500 03/14/19 0500 03/15/19 0438 03/15/19 0438 03/18/19 0535 03/19/19 0329  NA 127*  --  128*  --  131*  --  135 135  K 3.9   < > 3.8   < > 3.9   < > 4.7 3.4*  CL 95*  --  97*  --  99  --  103 107  CO2 24  --  24  --  21*  --  25 23  GLUCOSE 106*  --  119*  --  126*   --  114* 118*  BUN 14  --  14  --  15  --  28* 22  CREATININE 0.44*  --  0.43*  --  0.30*  --  0.53* 0.40*  CALCIUM 8.3*  --  8.4*  --  8.4*  --  8.3* 8.0*   < > = values in this interval not displayed.   GFR Estimated Creatinine Clearance: 57.6 mL/min (A) (by C-G formula based on SCr of 0.4 mg/dL (L)). Liver Function Tests: Recent Labs  Lab 03/18/19 0535 03/19/19 0329  AST 29 18  ALT 21 17  ALKPHOS 64 50  BILITOT 0.6 0.8  PROT 5.9* 4.7*  ALBUMIN 2.2* 1.7*   No results for input(s): LIPASE, AMYLASE in the last 168 hours. No results for input(s): AMMONIA in the last 168 hours. Coagulation profile Recent Labs  Lab 03/18/19 0535  INR 1.2    CBC: Recent Labs  Lab 03/15/19 0438 03/18/19 0535 03/19/19 0329  WBC 15.7* 13.7* 14.6*  NEUTROABS  --  8.3*  --   HGB 12.2* 11.9* 9.8*  HCT 36.5* 37.2* 30.6*  MCV 94.8 97.4  97.8  PLT 235 291 287   Cardiac Enzymes: No results for input(s): CKTOTAL, CKMB, CKMBINDEX, TROPONINI in the last 168 hours. BNP: Invalid input(s): POCBNP CBG: Recent Labs  Lab 03/18/19 1641 03/18/19 2044 03/19/19 0005 03/19/19 0440 03/19/19 0803  GLUCAP 89 79 110* 99 119*   D-Dimer No results for input(s): DDIMER in the last 72 hours. Hgb A1c No results for input(s): HGBA1C in the last 72 hours. Lipid Profile No results for input(s): CHOL, HDL, LDLCALC, TRIG, CHOLHDL, LDLDIRECT in the last 72 hours. Thyroid function studies Recent Labs    03/18/19 1012  TSH 1.599   Anemia work up No results for input(s): VITAMINB12, FOLATE, FERRITIN, TIBC, IRON, RETICCTPCT in the last 72 hours. Microbiology Recent Results (from the past 240 hour(s))  SARS CORONAVIRUS 2 (TAT 6-24 HRS) Nasopharyngeal Nasopharyngeal Swab     Status: None   Collection Time: 03/12/19 12:40 PM   Specimen: Nasopharyngeal Swab  Result Value Ref Range Status   SARS Coronavirus 2 NEGATIVE NEGATIVE Final    Comment: (NOTE) SARS-CoV-2 target nucleic acids are NOT DETECTED. The  SARS-CoV-2 RNA is generally detectable in upper and lower respiratory specimens during the acute phase of infection. Negative results do not preclude SARS-CoV-2 infection, do not rule out co-infections with other pathogens, and should not be used as the sole basis for treatment or other patient management decisions. Negative results must be combined with clinical observations, patient history, and epidemiological information. The expected result is Negative. Fact Sheet for Patients: SugarRoll.be Fact Sheet for Healthcare Providers: https://www.woods-mathews.com/ This test is not yet approved or cleared by the [place] FDA and  has been authorized for detection and/or diagnosis of SARS-CoV-2 by FDA under an Emergency Use Authorization (EUA). This EUA will remain  in effect (meaning this test can be used) for the duration of the COVID-19 declaration under Section 56 4(b)(1) of the Act, 21 U.S.C. section 360bbb-3(b)(1), unless the authorization is terminated or revoked sooner. Performed at [facility], [place] [address], [place], [place] [address]   Culture, blood (Routine x 2)     Status: None (Preliminary result)   Collection Time: 03/18/19  5:35 AM   Specimen: BLOOD RIGHT HAND  Result Value Ref Range Status   Specimen Description   Final    BLOOD RIGHT HAND Performed at [facility] [address] [place], [place] [address]    Special Requests   Final    BOTTLES DRAWN AEROBIC AND ANAEROBIC Blood Culture results may not be optimal due to an inadequate volume of blood received in culture bottles Performed at [facility] [address] [place], [place] [address]    Culture   Final    NO GROWTH < 24 HOURS Performed at [facility] [address] [place], [place] [address]    Report Status PENDING  Incomplete  Culture, blood (Routine x 2)     Status: None (Preliminary result)    Collection Time: 03/18/19  5:35 AM   Specimen: BLOOD LEFT FOREARM  Result Value Ref Range Status   Specimen Description   Final    BLOOD LEFT FOREARM Performed at [facility] [address] [place], [place] [address]    Special Requests   Final    BOTTLES DRAWN AEROBIC AND ANAEROBIC Blood Culture adequate volume Performed at [facility] [address], [place], [place] [address]    Culture   Final    NO GROWTH <  24 HOURS Performed at [facility], [place] [address], [place], [place] [address]    Report Status PENDING  Incomplete  MRSA PCR Screening     Status: None   Collection Time: 03/18/19  9:46 AM   Specimen: Nasal Mucosa; Nasopharyngeal  Result Value Ref Range Status   MRSA by PCR NEGATIVE NEGATIVE Final    Comment:        The GeneXpert MRSA Assay (FDA approved for NASAL specimens only), is one component of a comprehensive MRSA colonization surveillance program. It is not intended to diagnose MRSA infection nor to guide or monitor treatment for MRSA infections. Performed at [facility], [place] [address], [place], [place] [address]       Studies:  DG Abd 1 View  Result Date: 03/18/2019 CLINICAL DATA:  Sepsis. EXAM: ABDOMEN - 1 VIEW COMPARISON:  03/14/2019 FINDINGS: Single view of the abdomen demonstrates a gastrostomy tube. Dextroscoliosis of the lumbar spine with the apex at L2-L3. Again noted is a large amount of bowel gas throughout the abdomen. Gas in small and large bowel loops. Evidence for stool in the rectum. Cannot evaluate for free air on this supine portable view. IMPRESSION: 1. Nonobstructive bowel gas pattern.  Stool in the rectum. 2. Gastrostomy tube. Electronically Signed   By: Markus Daft M.D.   On: 03/18/2019 10:19   DG Chest Port 1 View  Result Date: 03/18/2019 CLINICAL DATA:  Shortness of breath, respiratory distress EXAM: PORTABLE CHEST 1 VIEW COMPARISON:  CT chest dated 02/24/2019  FINDINGS: Mild faint residual opacities in the central right upper lobe and bilateral lower lobes. Eventration of right hemidiaphragm. No pleural effusion or pneumothorax. The heart is normal in size.  Thoracic aortic atherosclerosis. IMPRESSION: Mild faint residual pulmonary opacities in this patient with recent COVID pneumonia. Electronically Signed   By: Julian Hy M.D.   On: 03/18/2019 07:40    Assessment: 86 y.o.[place], [place] man with diffuse large-cell B-cell non-Hodgkin's lymphoma diagnosed by left supraclavicular lymph node biopsy 02/28/2019 (a) stage III (b) IPI score 4 (age, stage, ECOG, LDH): predicts PFS3 of 55% in candidates for aggressive therapy (which however patient is not) (c) DLCBC subtype: non-germ cell (activated B-cell subtype) (d) MYC, BCL2 negative, BCL6 positive (not "double hit" lymphoma)  (1) infectious concerns: (a) COVID-19 positive (b) s/p pcn treatment for tertiary siphilys   (2) lymphoma treatment:  (a) not a candidate for standard CHOP-Rituxan or aggressive chemotherapy  (b) consider rituximab alone or ibrutinib alone if patient's functional status improves  (3) advanced directives:  (a) currently DNR  (b) strictly from a cancer point of view, if his lymphoma is not treated, I would quote him a 50% chance of 6 month survival (which may qualify him for Hospice)   Plan:  I discussed the situation with son [name]. They are still hoping patient may recover some functionality with Rehab and be able to return home. They understand he may not improve or continue to decline. The palliative team will continue to discuss goals of care.  I have cancelled the appointment here 1/29 and rescheduled for 2/12. If patient is ambulatory and interactive can consider palliative rituximab at that point.  Please let me know if I can be of further assistance at this point.   Chauncey Cruel,  MD 03/19/2019  8:19 AM Medical Oncology and Hematology [facility] [address] [place], [place] [address] Tel. [phone]    Fax. [phone]

## 2019-03-20 DIAGNOSIS — Z7189 Other specified counseling: Secondary | ICD-10-CM

## 2019-03-20 LAB — CBC WITH DIFFERENTIAL/PLATELET
Abs Immature Granulocytes: 0.08 10*3/uL — ABNORMAL HIGH (ref 0.00–0.07)
Basophils Absolute: 0.1 10*3/uL (ref 0.0–0.1)
Basophils Relative: 0 %
Eosinophils Absolute: 0.4 10*3/uL (ref 0.0–0.5)
Eosinophils Relative: 3 %
HCT: 33.2 % — ABNORMAL LOW (ref 39.0–52.0)
Hemoglobin: 10.6 g/dL — ABNORMAL LOW (ref 13.0–17.0)
Immature Granulocytes: 1 %
Lymphocytes Relative: 15 %
Lymphs Abs: 2.2 10*3/uL (ref 0.7–4.0)
MCH: 31 pg (ref 26.0–34.0)
MCHC: 31.9 g/dL (ref 30.0–36.0)
MCV: 97.1 fL (ref 80.0–100.0)
Monocytes Absolute: 4.5 10*3/uL — ABNORMAL HIGH (ref 0.1–1.0)
Monocytes Relative: 31 %
Neutro Abs: 7.3 10*3/uL (ref 1.7–7.7)
Neutrophils Relative %: 50 %
Platelets: 337 10*3/uL (ref 150–400)
RBC: 3.42 MIL/uL — ABNORMAL LOW (ref 4.22–5.81)
RDW: 15 % (ref 11.5–15.5)
WBC: 14.5 10*3/uL — ABNORMAL HIGH (ref 4.0–10.5)
nRBC: 0 % (ref 0.0–0.2)

## 2019-03-20 LAB — MAGNESIUM: Magnesium: 1.9 mg/dL (ref 1.7–2.4)

## 2019-03-20 LAB — GLUCOSE, CAPILLARY
Glucose-Capillary: 109 mg/dL — ABNORMAL HIGH (ref 70–99)
Glucose-Capillary: 128 mg/dL — ABNORMAL HIGH (ref 70–99)
Glucose-Capillary: 112 mg/dL — ABNORMAL HIGH (ref 70–99)
Glucose-Capillary: 127 mg/dL — ABNORMAL HIGH (ref 70–99)

## 2019-03-20 LAB — BASIC METABOLIC PANEL
Anion gap: 6 (ref 5–15)
BUN: 26 mg/dL — ABNORMAL HIGH (ref 8–23)
CO2: 21 mmol/L — ABNORMAL LOW (ref 22–32)
Calcium: 8 mg/dL — ABNORMAL LOW (ref 8.9–10.3)
Chloride: 110 mmol/L (ref 98–111)
Creatinine, Ser: 0.56 mg/dL — ABNORMAL LOW (ref 0.61–1.24)
GFR calc Af Amer: >60 mL/min (ref >60)
GFR calc non Af Amer: >60 mL/min (ref >60)
Glucose, Bld: 135 mg/dL — ABNORMAL HIGH (ref 70–99)
Potassium: 3.8 mmol/L (ref 3.5–5.1)
Sodium: 137 mmol/L (ref 135–145)

## 2019-03-20 LAB — PHOSPHORUS: Phosphorus: 2.4 mg/dL — ABNORMAL LOW (ref 2.5–4.6)

## 2019-03-20 LAB — PROCALCITONIN: Procalcitonin: <0.1 ng/mL

## 2019-03-20 NOTE — TOC Initial Note (Signed)
Transition of Care [facility]) - Initial/Assessment Note    Patient Details  Name: [name] MRN: [id number] Date of Birth: Nov 20, 1928  Transition of Care [facility]) CM/SW Contact:    Dessa Phi, RN Phone Number: 03/20/2019, 1:28 PM  Clinical Narrative:DNR.d/c plan return back to [facility] w/[facility]all[facility] Care Services-[facility].PT cons.                   Expected Discharge Plan: Skilled Nursing Facility Barriers to Discharge: Continued Medical Work up   Patient Goals and CMS Choice Patient states their goals for this hospitalization and ongoing recovery are:: return backto SNF      Expected Discharge Plan and Services Expected Discharge Plan: [place]   Discharge Planning Services: CM Consult   Living arrangements for the past 2 months: [place]                                      Prior Living Arrangements/Services Living arrangements for the past 2 months: [place] Lives with:: Facility Resident Patient language and need for interpreter reviewed:: Yes Do you feel safe going back to the place where you live?: Yes      Need for Family Participation in Patient Care: No (Comment) Care giver support system in place?: Yes (comment)   Criminal Activity/Legal Involvement Pertinent to Current Situation/Hospitalization: No - Comment as needed  Activities of Daily Living Home Assistive Devices/Equipment: Eyeglasses, Environmental consultant (specify type) ADL Screening (condition at time of admission) Patient's cognitive ability adequate to safely complete daily activities?: No Is the patient deaf or have difficulty hearing?: Yes Does the patient have difficulty seeing, even when wearing glasses/contacts?: No Does the patient have difficulty concentrating, remembering, or making decisions?: Yes Patient able to express need for assistance with ADLs?: Yes Does the patient have difficulty dressing or bathing?: Yes Independently performs  ADLs?: No Communication: Needs assistance Is this a change from baseline?: Pre-admission baseline Dressing (OT): Dependent Is this a change from baseline?: Pre-admission baseline Grooming: Dependent Is this a change from baseline?: Pre-admission baseline Feeding: Needs assistance(peg tube) Is this a change from baseline?: Pre-admission baseline Bathing: Dependent Is this a change from baseline?: Pre-admission baseline Toileting: Dependent Is this a change from baseline?: Pre-admission baseline In/Out Bed: Dependent Is this a change from baseline?: Pre-admission baseline Walks in Home: Dependent Is this a change from baseline?: Pre-admission baseline Does the patient have difficulty walking or climbing stairs?: Yes Weakness of Legs: Both Weakness of Arms/Hands: Both  Permission Sought/Granted Permission sought to share information with : Case Manager Permission granted to share information with : Yes, Verbal Permission Granted  Share Information with NAME: Case Manager  Permission granted to share info w AGENCY: [facility] w/[facility]  Permission granted to share info w Relationship: spouse [name] [phone]/son [name] 336 [phone]     Emotional Assessment Appearance:: Appears stated age Attitude/Demeanor/Rapport: Gracious Affect (typically observed): Accepting Orientation: : Oriented to Self Alcohol / Substance Use: Not Applicable Psych Involvement: No (comment)  Admission diagnosis:  SIRS (systemic inflammatory response syndrome) (HCC) [R65.10] Sepsis ([place]) [A41.9] Fever, unspecified fever cause [R50.9] Patient Active Problem List   Diagnosis Date Noted  . Sepsis ([place]) 03/18/2019  . Stage IV pressure ulcer of sacral region ([place]) 03/18/2019  . Diffuse large cell non-Hodgkin's lymphoma ([place]) 03/07/2019  . Weakness 02/23/2019  . Hypokalemia 02/23/2019  . Lab test positive for detection  of COVID-19 virus 02/23/2019  . Cerebral atrophy ([place]) 02/23/2019  . Paranasal  sinus disease 02/23/2019  . Retroperitoneal lymphadenopathy 02/23/2019  . Splenic mass 02/23/2019  . Pneumonia due to COVID-19 virus 02/23/2019  . Advanced dementia ([place]) 02/23/2019  . Delirium due to another medical condition, acute, hyperactive 02/23/2019  . Loss of appetite 02/23/2019  . Hyperlipidemia 02/23/2019  . GERD (gastroesophageal reflux disease) 02/23/2019  . HIV test positive ([place]) 02/23/2019  . Aorto-iliac atherosclerosis ([place]) 02/08/2019  . Constipation 02/08/2019  . Lumbar scoliosis 04/20/2018  . Prostate nodule 04/20/2018  . Hearing loss 10/19/2017  . Benign prostatic hyperplasia with urinary obstruction   . Skin lesions 10/10/2014  . Lower back pain 10/10/2014  . Advanced care planning/counseling discussion 03/20/2014  . MCI (mild cognitive impairment) with memory loss 03/20/2014  . Medicare annual wellness visit, subsequent 01/04/2012  . Cardiac murmur 01/04/2012  . Dyslipidemia 03/31/2010  . Glaucoma 03/31/2010  . Essential hypertension 03/31/2010  . Allergic rhinitis 03/31/2010  . SMALL BOWEL OBSTRUCTION, HX OF 03/31/2010  . NEPHROLITHIASIS, HX OF 03/31/2010   PCP:  [name], MD Pharmacy:   [facility] [address] [place] [address] Phone: [phone] Fax: [phone]     Social Determinants of Health (SDOH) Interventions    Readmission Risk Interventions Readmission Risk Prevention Plan 03/20/2019  Transportation Screening Complete  Medication Review Press photographer) Complete  PCP or Specialist appointment within 3-5 days of discharge Complete  HRI or Home Care Consult Complete  SW Recovery Care/Counseling Consult Complete  Palliative Care Screening Complete  Skilled Nursing Facility Complete  Some recent data might be hidden

## 2019-03-20 NOTE — Progress Notes (Signed)
PROGRESS NOTE    [name]  [id number] DOB: [birth year] DOA: 03/18/2019 PCP: [name], MD    Brief Narrative:  86 year old male with dementia, hypertension, hyperlipidemia, GERD and BPH from a skilled nursing facility presented with altered mental status and difficulty breathing. Recent extensive hospitalizations, 02/22/2019-02/2018 for COVID-19 pneumonia, neurosyphilis, extensive treatment with remdesivir, dexamethasone, IV penicillin for 2 weeks, also diagnosed with B-cell non-Hodgkin's lymphoma.  PEG tube placement and discharged to nursing home on 1/22 and presented back on 1/24 with above. In the emergency room temperature 101, tachycardic, respiratory rate 27.  Blood pressure was low normal.  Oxygen 96% on room air.  WBC 13.7.  Urinalysis unremarkable.  Chest x-ray with residual opacities consistent with recent COVID-19 pneumonia.  Admitted with sepsis protocol from unknown source.   Assessment & Plan:   Principal Problem:   Sepsis ([place]) Active Problems:   Dyslipidemia   Glaucoma   Essential hypertension   Advanced dementia ([place])   Diffuse large cell non-Hodgkin's lymphoma ([place])   Stage IV pressure ulcer of sacral region ([place])  Sepsis present on admission: Primary source unknown. Recent hospitalization. PICC line in place.  Also questionable aspiration pneumonia.  Metabolic fever from non-Hodgkin's lymphoma also possible. Clinically improving.  His cultures are pending.  Procalcitonin is normal. Patient was started on broad-spectrum antibiotics with vancomycin Flagyl and Rocephin.  No evidence of a Staphylococcus infection.  Discontinue vancomycin. We will continue antibiotics for 1 more day pending final culture data. PICC line was placed and continued on last admission for possible chemotherapy.  If cultures negative, will continue.  Stage IV sacral decubitus ulcer: Present on admission.  Local wound care.  Less likely source of infection.  Diffuse large cell  non-Hodgkin's lymphoma: Followed by oncology.  Less likely a candidate for any treatment now given severe debility. Will have out patient follow up.  Advanced dementia/failure to thrive/dysphagia: Recently received PEG tube.  Medication through the mouth with applesauce.  Resume tube feeding to the goal. DNR. Family still hoping for good functional outcome and improvement. Palliative care team consulted.   History of COVID-19 pneumonia: Completed treatment.  Outside of isolation window.  Hypertension: Blood pressures low on presentation.  Hold all antihypertensives for now.  Hypokalemia: Replaced.  Improved.  DVT prophylaxis: Lovenox subcu Code Status: DNR Family Communication: Patient's son.  1/25. Disposition Plan: patient is from a skilled nursing facility. Anticipated DC to skilled nursing facility tomorrow., Barriers to discharge treated for active infection and sepsis.   Consultants:   Palliative medicine  Oncology  Procedures:   None  Antimicrobials:  Anti-infectives (From admission, onward)   Start     Dose/Rate Route Frequency Ordered Stop   03/19/19 0800  vancomycin (VANCOREADY) IVPB 1250 mg/250 mL  Status:  Discontinued     1,250 mg 166.7 mL/hr over 90 Minutes Intravenous Every 24 hours 03/18/19 0925 03/20/19 1008   03/18/19 1400  metroNIDAZOLE (FLAGYL) IVPB 500 mg     500 mg 100 mL/hr over 60 Minutes Intravenous Every 8 hours 03/18/19 0828     03/18/19 1400  ceFEPIme (MAXIPIME) 2 g in sodium chloride 0.9 % 100 mL IVPB     2 g 200 mL/hr over 30 Minutes Intravenous Every 8 hours 03/18/19 0928     03/18/19 0830  metroNIDAZOLE (FLAGYL) IVPB 500 mg  Status:  Discontinued     500 mg 100 mL/hr over 60 Minutes Intravenous Every 8 hours 03/18/19 0827 03/18/19 0828   03/18/19 0615  vancomycin (VANCOREADY) IVPB 1500  mg/300 mL     1,500 mg 150 mL/hr over 120 Minutes Intravenous  Once 03/18/19 0530 03/18/19 0958   03/18/19 0530  ceFEPIme (MAXIPIME) 2 g in sodium chloride  0.9 % 100 mL IVPB     2 g 200 mL/hr over 30 Minutes Intravenous  Once 03/18/19 0526 03/18/19 0648   03/18/19 0530  metroNIDAZOLE (FLAGYL) IVPB 500 mg     500 mg 100 mL/hr over 60 Minutes Intravenous  Once 03/18/19 0526 03/18/19 0749   03/18/19 0530  vancomycin (VANCOCIN) IVPB 1000 mg/200 mL premix  Status:  Discontinued     1,000 mg 200 mL/hr over 60 Minutes Intravenous  Once 03/18/19 0526 03/18/19 0530         Subjective: Patient seen and examined.  No overnight events.  Tolerating tube feeding.  Patient was somehow more interactive today.  He was able to follow simple commands.  Remains afebrile.  On room air.  Objective: Vitals:   03/19/19 1407 03/19/19 2051 03/20/19 0443 03/20/19 0500  BP: (!) 125/56 125/72 137/79   Pulse: 87 79 98   Resp: 20 19 17    Temp: 97.8 F (36.6 C) 98.2 F (36.8 C) 98.3 F (36.8 C)   TempSrc: Oral Oral Oral   SpO2: 95% 99% 100%   Weight:    75.3 kg  Height:        Intake/Output Summary (Last 24 hours) at 03/20/2019 1009 Last data filed at 03/20/2019 0915 Gross per 24 hour  Intake 3605.93 ml  Output 1000 ml  Net 2605.93 ml   Filed Weights   03/18/19 0952 03/19/19 0500 03/20/19 0500  Weight: 71.2 kg 72.4 kg 75.3 kg    Examination:  General exam: Appears calm and comfortable, chronically sick looking.  On room air. Respiratory system: Clear to auscultation. Respiratory effort normal.  No added sounds. Cardiovascular system: S1 & S2 heard, RRR.  Right leg edema 1+. Gastrointestinal system: Abdomen is nondistended, soft and nontender. No organomegaly or masses felt. Normal bowel sounds heard.  PEG tube in place infusing. Central nervous system: Alert and awake but not oriented.  No focal neurological deficits.  Follows simple commands. Skin: No rashes, lesions or ulcers, stage IV sacral decubitus present on admission.  Picture in the media section. Psychiatry: Judgement and insight appear compromised.  Mood & affect flat.    Data  Reviewed: I have personally reviewed following labs and imaging studies  CBC: Recent Labs  Lab 03/15/19 0438 03/18/19 0535 03/19/19 0329 03/20/19 0830  WBC 15.7* 13.7* 14.6* 14.5*  NEUTROABS  --  8.3*  --  7.3  HGB 12.2* 11.9* 9.8* 10.6*  HCT 36.5* 37.2* 30.6* 33.2*  MCV 94.8 97.4 97.8 97.1  PLT 235 291 287 [id number]   Basic Metabolic Panel: Recent Labs  Lab 03/14/19 0500 03/15/19 0438 03/18/19 0535 03/19/19 0329 03/20/19 0830  NA 128* 131* 135 135 137  K 3.8 3.9 4.7 3.4* 3.8  CL 97* 99 103 107 110  CO2 24 21* 25 23 21*  GLUCOSE 119* 126* 114* 118* 135*  BUN 14 15 28* 22 26*  CREATININE 0.43* 0.30* 0.53* 0.40* 0.56*  CALCIUM 8.4* 8.4* 8.3* 8.0* 8.0*  MG  --   --   --   --  1.9  PHOS  --   --   --   --  2.4*   GFR: Estimated Creatinine Clearance: 57.6 mL/min (A) (by C-G formula based on SCr of 0.56 mg/dL (L)). Liver Function Tests: Recent Labs  Lab 03/18/19  0535 03/19/19 0329  AST 29 18  ALT 21 17  ALKPHOS 64 50  BILITOT 0.6 0.8  PROT 5.9* 4.7*  ALBUMIN 2.2* 1.7*   No results for input(s): LIPASE, AMYLASE in the last 168 hours. No results for input(s): AMMONIA in the last 168 hours. Coagulation Profile: Recent Labs  Lab 03/18/19 0535  INR 1.2   Cardiac Enzymes: No results for input(s): CKTOTAL, CKMB, CKMBINDEX, TROPONINI in the last 168 hours. BNP (last 3 results) No results for input(s): PROBNP in the last 8760 hours. HbA1C: No results for input(s): HGBA1C in the last 72 hours. CBG: Recent Labs  Lab 03/19/19 0803 03/19/19 1146 03/19/19 1605 03/20/19 0440 03/20/19 0755  GLUCAP 119* 108* 97 109* 128*   Lipid Profile: No results for input(s): CHOL, HDL, LDLCALC, TRIG, CHOLHDL, LDLDIRECT in the last 72 hours. Thyroid Function Tests: Recent Labs    03/18/19 1012  TSH 1.599   Anemia Panel: No results for input(s): VITAMINB12, FOLATE, FERRITIN, TIBC, IRON, RETICCTPCT in the last 72 hours. Sepsis Labs: Recent Labs  Lab 03/18/19 0535  03/18/19 1012 03/19/19 0329  PROCALCITON  --  <0.10 0.11  LATICACIDVEN 1.5  --   --     Recent Results (from the past 240 hour(s))  SARS CORONAVIRUS 2 (TAT 6-24 HRS) Nasopharyngeal Nasopharyngeal Swab     Status: None   Collection Time: 03/12/19 12:40 PM   Specimen: Nasopharyngeal Swab  Result Value Ref Range Status   SARS Coronavirus 2 NEGATIVE NEGATIVE Final    Comment: (NOTE) SARS-CoV-2 target nucleic acids are NOT DETECTED. The SARS-CoV-2 RNA is generally detectable in upper and lower respiratory specimens during the acute phase of infection. Negative results do not preclude SARS-CoV-2 infection, do not rule out co-infections with other pathogens, and should not be used as the sole basis for treatment or other patient management decisions. Negative results must be combined with clinical observations, patient history, and epidemiological information. The expected result is Negative. Fact Sheet for Patients: SugarRoll.be Fact Sheet for Healthcare Providers: https://www.woods-mathews.com/ This test is not yet approved or cleared by the [place] FDA and  has been authorized for detection and/or diagnosis of SARS-CoV-2 by FDA under an Emergency Use Authorization (EUA). This EUA will remain  in effect (meaning this test can be used) for the duration of the COVID-19 declaration under Section 56 4(b)(1) of the Act, 21 U.S.C. section 360bbb-3(b)(1), unless the authorization is terminated or revoked sooner. Performed at [facility], [place] [address], [place], [place] [address]   Culture, blood (Routine x 2)     Status: None (Preliminary result)   Collection Time: 03/18/19  5:35 AM   Specimen: BLOOD RIGHT HAND  Result Value Ref Range Status   Specimen Description   Final    BLOOD RIGHT HAND Performed at [facility] [address] [place], [place] [address]    Special Requests   Final    BOTTLES DRAWN  AEROBIC AND ANAEROBIC Blood Culture results may not be optimal due to an inadequate volume of blood received in culture bottles Performed at [facility] [address] [place], [place] [address]    Culture   Final    NO GROWTH < 24 HOURS Performed at [facility] [address], [place], [place] [address]    Report Status PENDING  Incomplete  Culture, blood (Routine x 2)     Status: None (Preliminary result)   Collection Time: 03/18/19  5:35 AM  Specimen: BLOOD LEFT FOREARM  Result Value Ref Range Status   Specimen Description   Final    BLOOD LEFT FOREARM Performed at [facility] [address], [place], [place] [address]    Special Requests   Final    BOTTLES DRAWN AEROBIC AND ANAEROBIC Blood Culture adequate volume Performed at [facility] [address], [place], [place] [address]    Culture   Final    NO GROWTH < 24 HOURS Performed at [facility] [address] [place], [place] [address]    Report Status PENDING  Incomplete  Urine culture     Status: None   Collection Time: 03/18/19  5:35 AM   Specimen: Urine, Random  Result Value Ref Range Status   Specimen Description   Final    URINE, RANDOM Performed at [facility] [address], [place], [place] [address]    Special Requests   Final    NONE Performed at [facility], [place] [address] [place], [place] [address]    Culture   Final    NO GROWTH Performed at [facility], [place] [address] [place], [place] [address]    Report Status 03/19/2019 FINAL  Final  MRSA PCR Screening     Status: None   Collection Time: 03/18/19  9:46 AM   Specimen: Nasal Mucosa; Nasopharyngeal  Result Value Ref Range Status   MRSA by PCR NEGATIVE NEGATIVE Final    Comment:        The GeneXpert MRSA Assay (FDA approved for NASAL specimens only), is one component of a comprehensive MRSA colonization surveillance  program. It is not intended to diagnose MRSA infection nor to guide or monitor treatment for MRSA infections. Performed at [facility], [place] [address], [place], [place] [address]          Radiology Studies: No results found.      Scheduled Meds: . brinzolamide  1 drop Both Eyes TID  . chlorhexidine  15 mL Mouth Rinse BID  . Chlorhexidine Gluconate Cloth  6 each Topical Daily  . enoxaparin (LOVENOX) injection  40 mg Subcutaneous Q24H  . feeding supplement (PRO-STAT SUGAR FREE 64)  30 mL Per Tube Daily  . free water  100 mL Per Tube Q6H  . latanoprost  1 drop Both Eyes QHS  . mouth rinse  15 mL Mouth Rinse q12n4p  . potassium chloride  20 mEq Per Tube BID  . sodium chloride flush  10-40 mL Intracatheter Q12H   Continuous Infusions: . sodium chloride Stopped (03/18/19 2252)  . ceFEPime (MAXIPIME) IV 2 g (03/20/19 0503)  . feeding supplement (OSMOLITE 1.5 CAL) 1,000 mL (03/20/19 0145)  . metronidazole 500 mg (03/20/19 0504)     LOS: 2 days    Time spent: 30 minutes     Barb Merino, MD [facility] Pager [phone]

## 2019-03-20 NOTE — NC FL2 (Signed)
[place] LEVEL OF CARE SCREENING TOOL     IDENTIFICATION  Patient Name: [name] Birthdate: 07/09/1928 Sex: male Admission Date (Current Location): 03/18/2019  [facility] and [place] Number:  Herbalist and Address:  [facility],  [place], [place]      Provider Number: [id number]  Attending Physician Name and Address:  Barb Merino, MD  Relative Name and Phone Number:  Marquiese, Cassani [phone] or Duman,phil Son   [phone]    Current Level of Care: Hospital Recommended Level of Care: [place] Prior Approval Number:    Date Approved/Denied:   PASRR Number: [id number]  Discharge Plan: SNF    Current Diagnoses: Patient Active Problem List   Diagnosis Date Noted  . Sepsis ([place]) 03/18/2019  . Stage IV pressure ulcer of sacral region ([place]) 03/18/2019  . Diffuse large cell non-Hodgkin's lymphoma ([place]) 03/07/2019  . Weakness 02/23/2019  . Hypokalemia 02/23/2019  . Lab test positive for detection of COVID-19 virus 02/23/2019  . Cerebral atrophy ([place]) 02/23/2019  . Paranasal sinus disease 02/23/2019  . Retroperitoneal lymphadenopathy 02/23/2019  . Splenic mass 02/23/2019  . Pneumonia due to COVID-19 virus 02/23/2019  . Advanced dementia ([place]) 02/23/2019  . Delirium due to another medical condition, acute, hyperactive 02/23/2019  . Loss of appetite 02/23/2019  . Hyperlipidemia 02/23/2019  . GERD (gastroesophageal reflux disease) 02/23/2019  . HIV test positive ([place]) 02/23/2019  . Aorto-iliac atherosclerosis ([place]) 02/08/2019  . Constipation 02/08/2019  . Lumbar scoliosis 04/20/2018  . Prostate nodule 04/20/2018  . Hearing loss 10/19/2017  . Benign prostatic hyperplasia with urinary obstruction   . Skin lesions 10/10/2014  . Lower back pain 10/10/2014  . Advanced care planning/counseling discussion 03/20/2014  . MCI (mild cognitive impairment) with memory loss 03/20/2014  . Medicare  annual wellness visit, subsequent 01/04/2012  . Cardiac murmur 01/04/2012  . Dyslipidemia 03/31/2010  . Glaucoma 03/31/2010  . Essential hypertension 03/31/2010  . Allergic rhinitis 03/31/2010  . SMALL BOWEL OBSTRUCTION, HX OF 03/31/2010  . NEPHROLITHIASIS, HX OF 03/31/2010    Orientation RESPIRATION BLADDER Height & Weight     Self  Normal Incontinent, Indwelling catheter(urine retention) Weight: 75.3 kg Height:  5\' 6"  (167.6 cm)  BEHAVIORAL SYMPTOMS/MOOD NEUROLOGICAL BOWEL NUTRITION STATUS      Incontinent Feeding tube(NPO-PEG Tube)  AMBULATORY STATUS COMMUNICATION OF NEEDS Skin   Limited Assist Verbally PU Stage and Appropriate Care(sacrum-unstageable wound-w-d bid)   PU Stage 2 Dressing: BID                   Personal Care Assistance Level of Assistance  Bathing, Feeding, Dressing Bathing Assistance: Limited assistance Feeding assistance: Limited assistance Dressing Assistance: Limited assistance     Functional Limitations Info  Sight, Hearing, Speech Sight Info: Impaired(eyeglasses) Hearing Info: Adequate Speech Info: Impaired(PEG tube-osmolite)    SPECIAL CARE FACTORS FREQUENCY  PT (By licensed PT), OT (By licensed OT)     PT Frequency: 5x week OT Frequency: 5x week            Contractures Contractures Info: Not present    Additional Factors Info  Code Status, Allergies Code Status Info: Full code Allergies Info: NKA           Current Medications (03/20/2019):  This is the current hospital active medication list Current Facility-Administered Medications  Medication Dose Route Frequency Provider Last Rate Last Admin  . 0.9 %  sodium chloride infusion   Intravenous PRN Wynonia Musty, RN  Stopped at 03/18/19 2252  . acetaminophen (TYLENOL) tablet 650 mg  650 mg Oral Q6H PRN [place] Donnamarie Poag, DO       Or  . acetaminophen (TYLENOL) suppository 650 mg  650 mg Rectal Q6H PRN [place] Eric J, DO      . brinzolamide (AZOPT) 1 % ophthalmic suspension  1 drop  1 drop Both Eyes TID [place] Eric J, DO   1 drop at 03/20/19 [id number]  . ceFEPIme (MAXIPIME) 2 g in sodium chloride 0.9 % 100 mL IVPB  2 g Intravenous Q8H Eudelia Bunch, RPH   Stopped at 03/20/19 0533  . chlorhexidine (PERIDEX) 0.12 % solution 15 mL  15 mL Mouth Rinse BID Barb Merino, MD   15 mL at 03/20/19 0916  . Chlorhexidine Gluconate Cloth 2 % PADS 6 each  6 each Topical Daily [place] Eric J, DO   6 each at 03/20/19 [id number]  . enoxaparin (LOVENOX) injection 40 mg  40 mg Subcutaneous Q24H [place] Eric J, DO   40 mg at 03/20/19 [id number]  . feeding supplement (OSMOLITE 1.5 CAL) liquid 1,000 mL  1,000 mL Per Tube Continuous Barb Merino, MD 55 mL/hr at 03/20/19 0145 1,000 mL at 03/20/19 0145  . feeding supplement (PRO-STAT SUGAR FREE 64) liquid 30 mL  30 mL Per Tube Daily Barb Merino, MD   30 mL at 03/20/19 0916  . free water 100 mL  100 mL Per Tube Q6H Barb Merino, MD   100 mL at 03/20/19 1156  . latanoprost (XALATAN) 0.005 % ophthalmic solution 1 drop  1 drop Both Eyes QHS [place] Donnamarie Poag, DO   1 drop at 03/19/19 2111  . lip balm (CARMEX) ointment   Topical PRN Barb Merino, MD      . MEDLINE mouth rinse  15 mL Mouth Rinse q12n4p Barb Merino, MD   15 mL at 03/20/19 1156  . metroNIDAZOLE (FLAGYL) IVPB 500 mg  500 mg Intravenous Q8H [place] Donnamarie Poag, DO   Stopped at 03/20/19 [id number]  . ondansetron (ZOFRAN) tablet 4 mg  4 mg Oral Q6H PRN [place] Donnamarie Poag, DO       Or  . ondansetron [facility]) injection 4 mg  4 mg Intravenous Q6H PRN [place] Eric J, DO      . potassium chloride (KLOR-CON) packet 20 mEq  20 mEq Per Tube BID Barb Merino, MD   20 mEq at 03/20/19 0916  . sodium chloride flush (NS) 0.9 % injection 10-40 mL  10-40 mL Intracatheter Q12H [place] Donnamarie Poag, DO   10 mL at 03/19/19 2117  . sodium chloride flush (NS) 0.9 % injection 10-40 mL  10-40 mL Intracatheter PRN [place] Eric J, DO         Discharge Medications: Please see discharge summary for a list of discharge  medications.  Relevant Imaging Results:  Relevant Lab Results:   Additional Information SSN [id number]  Dessa Phi, RN

## 2019-03-20 NOTE — Consult Note (Signed)
Palliative care progress note  Reason for consult: Goals of care in light of recent COVID-19 infection and lymphoma  Palliative care consult received.  Chart reviewed including personal review of pertinent labs and imaging.  Discussed with bedside staff.  Briefly, [name] is an 86 year old male with past medical history significant for glaucoma, hard of hearing, advanced dementia, BPH, GERD, hyperlipidemia, hypertension, recent diagnosis of lymphoma, recent PEG placement, recent COVID who presented to [facility] long ED following brief transition to SNF.  He was doing well after transition, but then developed fever, lethargy, and was transferred back to the hospital.  I called and was able to reach patient's son, [name].  [name] reports that they have been talking with his father's physicians and have a good understanding of his care and medical course to this point in time.  We discussed his clinical course over the past month including brief stay at SNF followed by readmission.  I talked briefly again with [name] about the things that are most important to [name]. Touched on his family and faith.  Discussed in past hat his hobbies include watching sports with his wife, particularly baseball and college basketball.  At this point, [name] reports that family is invested in plan to continue with current plan until he is able to follow-up with Dr. Jana Reeves.  Discussed plan for continued follow-up by palliative care as outpatient to continue to assist in decision making based in his clinical course.  -DNR -At this time, family is invested in plan for return to SNF to see how much he can regain in regard to his nutrition and functional status.  His wife remains hopeful that he will regain enough function to be able to be considered for chemotherapy for his lymphoma.  His son, [name], reports understanding that they may not have desired outcome, and he is continuing to work with his mother to determine best  steps moving forward based upon his clinical course over the next couple of weeks.  Family places a lot of trust in Dr. Jana Reeves and appreciate his continued guidance on if systemic therapy is likely to add time and quality to his life.  I recommended palliative care to follow him as outpatient as they have previously been arranging to continue to assist in decision making if he is unable to regain functional status to be a candidate for chemotherapy.  Total time: 50 minutes Greater than 50%  of this time was spent counseling and coordinating care related to the above assessment and plan.  Brett Rough, MD [place] Team [phone]

## 2019-03-20 NOTE — Plan of Care (Signed)
  Problem: Education: Goal: Knowledge of General Education information will improve Description: Including pain rating scale, medication(s)/side effects and non-pharmacologic comfort measures Outcome: Completed/Met   Problem: Health Behavior/Discharge Planning: Goal: Ability to manage health-related needs will improve Outcome: Progressing   Problem: Clinical Measurements: Goal: Ability to maintain clinical measurements within normal limits will improve Outcome: Progressing Goal: Will remain free from infection Outcome: Progressing Goal: Diagnostic test results will improve Outcome: Progressing Goal: Respiratory complications will improve Outcome: Completed/Met Goal: Cardiovascular complication will be avoided Outcome: Completed/Met   Problem: Activity: Goal: Risk for activity intolerance will decrease Outcome: Progressing   Problem: Nutrition: Goal: Adequate nutrition will be maintained Outcome: Progressing   Problem: Coping: Goal: Level of anxiety will decrease Outcome: Completed/Met   Problem: Elimination: Goal: Will not experience complications related to bowel motility Outcome: Completed/Met Goal: Will not experience complications related to urinary retention Outcome: Progressing   Problem: Pain Managment: Goal: General experience of comfort will improve Outcome: Completed/Met   Problem: Safety: Goal: Ability to remain free from injury will improve Outcome: Progressing   Problem: Skin Integrity: Goal: Risk for impaired skin integrity will decrease Outcome: Progressing   Problem: Fluid Volume: Goal: Hemodynamic stability will improve Outcome: Progressing   Problem: Clinical Measurements: Goal: Diagnostic test results will improve Outcome: Progressing Goal: Signs and symptoms of infection will decrease Outcome: Progressing   Problem: Respiratory: Goal: Ability to maintain adequate ventilation will improve Outcome: Completed/Met

## 2019-03-21 ENCOUNTER — Inpatient Hospital Stay (HOSPITAL_COMMUNITY): Payer: Medicare Other

## 2019-03-21 ENCOUNTER — Telehealth: Payer: Self-pay | Admitting: Family Medicine

## 2019-03-21 DIAGNOSIS — F0390 Unspecified dementia without behavioral disturbance: Secondary | ICD-10-CM

## 2019-03-21 DIAGNOSIS — I10 Essential (primary) hypertension: Secondary | ICD-10-CM

## 2019-03-21 LAB — CBC WITH DIFFERENTIAL/PLATELET
Abs Immature Granulocytes: 0.11 10*3/uL — ABNORMAL HIGH (ref 0.00–0.07)
Basophils Absolute: 0.1 10*3/uL (ref 0.0–0.1)
Basophils Relative: 1 %
Eosinophils Absolute: 0.4 10*3/uL (ref 0.0–0.5)
Eosinophils Relative: 2 %
HCT: 33 % — ABNORMAL LOW (ref 39.0–52.0)
Hemoglobin: 10.5 g/dL — ABNORMAL LOW (ref 13.0–17.0)
Immature Granulocytes: 1 %
Lymphocytes Relative: 16 %
Lymphs Abs: 2.8 10*3/uL (ref 0.7–4.0)
MCH: 30.7 pg (ref 26.0–34.0)
MCHC: 31.8 g/dL (ref 30.0–36.0)
MCV: 96.5 fL (ref 80.0–100.0)
Monocytes Absolute: 6.5 10*3/uL — ABNORMAL HIGH (ref 0.1–1.0)
Monocytes Relative: 39 %
Neutro Abs: 6.9 10*3/uL (ref 1.7–7.7)
Neutrophils Relative %: 41 %
Platelets: 307 10*3/uL (ref 150–400)
RBC: 3.42 MIL/uL — ABNORMAL LOW (ref 4.22–5.81)
RDW: 15.1 % (ref 11.5–15.5)
WBC: 16.8 10*3/uL — ABNORMAL HIGH (ref 4.0–10.5)
nRBC: 0 % (ref 0.0–0.2)

## 2019-03-21 LAB — GLUCOSE, CAPILLARY
Glucose-Capillary: 114 mg/dL — ABNORMAL HIGH (ref 70–99)
Glucose-Capillary: 120 mg/dL — ABNORMAL HIGH (ref 70–99)
Glucose-Capillary: 124 mg/dL — ABNORMAL HIGH (ref 70–99)
Glucose-Capillary: 127 mg/dL — ABNORMAL HIGH (ref 70–99)
Glucose-Capillary: 102 mg/dL — ABNORMAL HIGH (ref 70–99)
Glucose-Capillary: 94 mg/dL (ref 70–99)
Glucose-Capillary: 145 mg/dL — ABNORMAL HIGH (ref 70–99)
Glucose-Capillary: 108 mg/dL — ABNORMAL HIGH (ref 70–99)
Glucose-Capillary: 110 mg/dL — ABNORMAL HIGH (ref 70–99)

## 2019-03-21 LAB — BASIC METABOLIC PANEL
Anion gap: 7 (ref 5–15)
BUN: 29 mg/dL — ABNORMAL HIGH (ref 8–23)
CO2: 21 mmol/L — ABNORMAL LOW (ref 22–32)
Calcium: 8.2 mg/dL — ABNORMAL LOW (ref 8.9–10.3)
Chloride: 108 mmol/L (ref 98–111)
Creatinine, Ser: 0.76 mg/dL (ref 0.61–1.24)
GFR calc Af Amer: >60 mL/min (ref >60)
GFR calc non Af Amer: >60 mL/min (ref >60)
Glucose, Bld: 128 mg/dL — ABNORMAL HIGH (ref 70–99)
Potassium: 4.4 mmol/L (ref 3.5–5.1)
Sodium: 136 mmol/L (ref 135–145)

## 2019-03-21 MED ORDER — FUROSEMIDE 10 MG/ML IJ SOLN
40.0000 mg | Freq: Once | INTRAMUSCULAR | Status: AC
  Administered 2019-03-21: 10:00:00 40 mg via INTRAVENOUS
  Filled 2019-03-21: qty 4

## 2019-03-21 NOTE — Telephone Encounter (Signed)
Spoke with wife and son for update on Mr Hoadley.

## 2019-03-21 NOTE — Progress Notes (Signed)
PROGRESS NOTE    [name]  [id number] DOB: 07/30/1928 DOA: 03/18/2019 PCP: [name], MD    Brief Narrative:  86 year old male with dementia, hypertension, hyperlipidemia, GERD and BPH from skilled nursing facility presented with altered mental status and difficulty breathing. Recent extensive hospitalizations, 02/22/2019-02/2018 for COVID-19 pneumonia, neurosyphilis, extensive treatment with remdesivir, dexamethasone, IV penicillin for 2 weeks, also diagnosed with B-cell non-Hodgkin's lymphoma.  PEG tube placement and discharged to nursing home on 1/22 and presented back on 1/24 with above.  In the emergency room temperature 101, tachycardic, respiratory rate 27.  Blood pressure was low normal.  Oxygen 96% on room air.  WBC 13.7.  Urinalysis unremarkable.  Chest x-ray with residual opacities consistent with recent COVID-19 pneumonia.  Admitted with sepsis protocol from unknown source possibly aspiration.  1/27/ 2021: Patient was gradually improving, then had another episode of low-grade temperature, he become more lethargic, gurgly sound on breathing. Another aspiration event even if he is n.p.o.. Patient was brought to the emergency room 3 days ago with exactly same symptoms.    Assessment & Plan:   Principal Problem:   Sepsis ([place]) Active Problems:   Dyslipidemia   Glaucoma   Essential hypertension   Advanced dementia ([place])   Diffuse large cell non-Hodgkin's lymphoma ([place])   Stage IV pressure ulcer of sacral region ([place])  Sepsis present on admission: Probable intermittent aspiration pneumonia. reecent hospitalization. PICC line in place.  With initial clinical improvement on cefepime and Flagyl, he had another similar event today.  Continue NPO. Continue with cefepime and Flagyl. Vancomycin discontinued. Chest x-ray today.  Negative cultures so far.  Stage IV sacral decubitus ulcer: Present on admission.  Local wound care.  Less likely source of infection.   Diffuse large cell non-Hodgkin's lymphoma: Followed by oncology.  Less likely a candidate for any treatment now given severe debility. Will have out patient follow up.  Advanced dementia/failure to thrive/dysphagia: Recently received PEG tube. Keep NPO. Administer medicine through PEG tube. Tube feeding at goal. DNR. Family still hoping for good functional outcome and improvement. Palliative care team consulted.   History of COVID-19 pneumonia: Completed treatment.  Outside of isolation window.  Hypertension: Blood pressures low on presentation.  Hold all antihypertensives for now.  Hypokalemia: Replaced.  Improved.  DVT prophylaxis: Lovenox subcu Code Status: DNR Family Communication: Patient's son on the phone. His sister is visiting patient in the hospital. Updated patient's son about event from today. Patient is intermittently aspirating, this may not get better. Disposition Plan: patient is from a skilled nursing facility. Anticipated DC to skilled nursing facility when adequate improvement. Barriers to discharge treated for active infection and sepsis.   Consultants:   Palliative medicine  Oncology  Procedures:   None  Antimicrobials:  Anti-infectives (From admission, onward)   Start     Dose/Rate Route Frequency Ordered Stop   03/19/19 0800  vancomycin (VANCOREADY) IVPB 1250 mg/250 mL  Status:  Discontinued     1,250 mg 166.7 mL/hr over 90 Minutes Intravenous Every 24 hours 03/18/19 0925 03/20/19 1008   03/18/19 1400  metroNIDAZOLE (FLAGYL) IVPB 500 mg     500 mg 100 mL/hr over 60 Minutes Intravenous Every 8 hours 03/18/19 0828     03/18/19 1400  ceFEPIme (MAXIPIME) 2 g in sodium chloride 0.9 % 100 mL IVPB     2 g 200 mL/hr over 30 Minutes Intravenous Every 8 hours 03/18/19 0928     03/18/19 0830  metroNIDAZOLE (FLAGYL) IVPB 500 mg  Status:  Discontinued     500 mg 100 mL/hr over 60 Minutes Intravenous Every 8 hours 03/18/19 0827 03/18/19 0828   03/18/19 0615   vancomycin (VANCOREADY) IVPB 1500 mg/300 mL     1,500 mg 150 mL/hr over 120 Minutes Intravenous  Once 03/18/19 0530 03/18/19 0958   03/18/19 0530  ceFEPIme (MAXIPIME) 2 g in sodium chloride 0.9 % 100 mL IVPB     2 g 200 mL/hr over 30 Minutes Intravenous  Once 03/18/19 0526 03/18/19 0648   03/18/19 0530  metroNIDAZOLE (FLAGYL) IVPB 500 mg     500 mg 100 mL/hr over 60 Minutes Intravenous  Once 03/18/19 0526 03/18/19 0749   03/18/19 0530  vancomycin (VANCOCIN) IVPB 1000 mg/200 mL premix  Status:  Discontinued     1,000 mg 200 mL/hr over 60 Minutes Intravenous  Once 03/18/19 0526 03/18/19 0530         Subjective: Patient seen and examined. Also yesterday he was more alert and was able to talk to his family members. Since today morning, he was more lethargic, upper airway secretions. Developed temperature 100.8.  Objective: Vitals:   03/21/19 0433 03/21/19 0500 03/21/19 1251 03/21/19 1315  BP: (!) 114/58  111/73 135/61  Pulse: (!) 104  (!) 118 (!) 126  Resp: 17  (!) 29 (!) 28  Temp: 98.3 F (36.8 C)  100.2 F (37.9 C) (!) 100.8 F (38.2 C)  TempSrc: Oral  Oral Axillary  SpO2: 94%  96% 97%  Weight:  75 kg    Height:        Intake/Output Summary (Last 24 hours) at 03/21/2019 1322 Last data filed at 03/21/2019 1012 Gross per 24 hour  Intake 1648.47 ml  Output 1700 ml  Net -51.53 ml   Filed Weights   03/19/19 0500 03/20/19 0500 03/21/19 0500  Weight: 72.4 kg 75.3 kg 75 kg    Examination:  General exam: Sick looking, on room air. Not in any distress. Less interactive today. Respiratory system: Bilateral extensive upper airway sounds.  Cardiovascular system: S1 & S2 heard, tachycardic. RRR.  Right leg edema 1+. Gastrointestinal system: Abdomen is nondistended, soft and nontender. No organomegaly or masses felt. Normal bowel sounds heard.  PEG tube in place infusing. Central nervous system: Not following commands. More lethargic and confused today.  Skin: No rashes, lesions,  stage IV sacral decubitus present on admission.  Picture in the media section. Psychiatry: Judgement and insight appear compromised.  Mood & affect anxious and flat.    Data Reviewed: I have personally reviewed following labs and imaging studies  CBC: Recent Labs  Lab 03/15/19 0438 03/18/19 0535 03/19/19 0329 03/20/19 0830 03/21/19 0424  WBC 15.7* 13.7* 14.6* 14.5* 16.8*  NEUTROABS  --  8.3*  --  7.3 6.9  HGB 12.2* 11.9* 9.8* 10.6* 10.5*  HCT 36.5* 37.2* 30.6* 33.2* 33.0*  MCV 94.8 97.4 97.8 97.1 96.5  PLT 235 291 287 337 [id number]   Basic Metabolic Panel: Recent Labs  Lab 03/15/19 0438 03/18/19 0535 03/19/19 0329 03/20/19 0830 03/21/19 0424  NA 131* 135 135 137 136  K 3.9 4.7 3.4* 3.8 4.4  CL 99 103 107 110 108  CO2 21* 25 23 21* 21*  GLUCOSE 126* 114* 118* 135* 128*  BUN 15 28* 22 26* 29*  CREATININE 0.30* 0.53* 0.40* 0.56* 0.76  CALCIUM 8.4* 8.3* 8.0* 8.0* 8.2*  MG  --   --   --  1.9  --   PHOS  --   --   --  2.4*  --    GFR: Estimated Creatinine Clearance: 57.6 mL/min (by C-G formula based on SCr of 0.76 mg/dL). Liver Function Tests: Recent Labs  Lab 03/18/19 0535 03/19/19 0329  AST 29 18  ALT 21 17  ALKPHOS 64 50  BILITOT 0.6 0.8  PROT 5.9* 4.7*  ALBUMIN 2.2* 1.7*   No results for input(s): LIPASE, AMYLASE in the last 168 hours. No results for input(s): AMMONIA in the last 168 hours. Coagulation Profile: Recent Labs  Lab 03/18/19 0535  INR 1.2   Cardiac Enzymes: No results for input(s): CKTOTAL, CKMB, CKMBINDEX, TROPONINI in the last 168 hours. BNP (last 3 results) No results for input(s): PROBNP in the last 8760 hours. HbA1C: No results for input(s): HGBA1C in the last 72 hours. CBG: Recent Labs  Lab 03/20/19 2047 03/21/19 0022 03/21/19 0426 03/21/19 0728 03/21/19 1156  GLUCAP 114* 124* 120* 127* 145*   Lipid Profile: No results for input(s): CHOL, HDL, LDLCALC, TRIG, CHOLHDL, LDLDIRECT in the last 72 hours. Thyroid Function Tests: No  results for input(s): TSH, T4TOTAL, FREET4, T3FREE, THYROIDAB in the last 72 hours. Anemia Panel: No results for input(s): VITAMINB12, FOLATE, FERRITIN, TIBC, IRON, RETICCTPCT in the last 72 hours. Sepsis Labs: Recent Labs  Lab 03/18/19 0535 03/18/19 1012 03/19/19 0329 03/20/19 0830  PROCALCITON  --  <0.10 0.11 <0.10  LATICACIDVEN 1.5  --   --   --     Recent Results (from the past 240 hour(s))  SARS CORONAVIRUS 2 (TAT 6-24 HRS) Nasopharyngeal Nasopharyngeal Swab     Status: None   Collection Time: 03/12/19 12:40 PM   Specimen: Nasopharyngeal Swab  Result Value Ref Range Status   SARS Coronavirus 2 NEGATIVE NEGATIVE Final    Comment: (NOTE) SARS-CoV-2 target nucleic acids are NOT DETECTED. The SARS-CoV-2 RNA is generally detectable in upper and lower respiratory specimens during the acute phase of infection. Negative results do not preclude SARS-CoV-2 infection, do not rule out co-infections with other pathogens, and should not be used as the sole basis for treatment or other patient management decisions. Negative results must be combined with clinical observations, patient history, and epidemiological information. The expected result is Negative. Fact Sheet for Patients: SugarRoll.be Fact Sheet for Healthcare Providers: https://www.woods-mathews.com/ This test is not yet approved or cleared by the [place] FDA and  has been authorized for detection and/or diagnosis of SARS-CoV-2 by FDA under an Emergency Use Authorization (EUA). This EUA will remain  in effect (meaning this test can be used) for the duration of the COVID-19 declaration under Section 56 4(b)(1) of the Act, 21 U.S.C. section 360bbb-3(b)(1), unless the authorization is terminated or revoked sooner. Performed at [facility], [place] [address], [place], [place] [address]   Culture, blood (Routine x 2)     Status: None (Preliminary result)   Collection Time:  03/18/19  5:35 AM   Specimen: BLOOD RIGHT HAND  Result Value Ref Range Status   Specimen Description   Final    BLOOD RIGHT HAND Performed at [facility] [address] [place], [place] [address]    Special Requests   Final    BOTTLES DRAWN AEROBIC AND ANAEROBIC Blood Culture results may not be optimal due to an inadequate volume of blood received in culture bottles Performed at [facility] [address], [place], [place] [address]    Culture   Final    NO GROWTH 3 DAYS Performed at [facility], [place]  [address], [place], [place] [address]    Report Status PENDING  Incomplete  Culture, blood (Routine x 2)     Status: None (Preliminary result)   Collection Time: 03/18/19  5:35 AM   Specimen: BLOOD LEFT FOREARM  Result Value Ref Range Status   Specimen Description   Final    BLOOD LEFT FOREARM Performed at [facility] [address] [place], [place] [address]    Special Requests   Final    BOTTLES DRAWN AEROBIC AND ANAEROBIC Blood Culture adequate volume Performed at [facility] [address], [place], [place] [address]    Culture   Final    NO GROWTH 3 DAYS Performed at [facility], [place] [address] [place], [place] [address]    Report Status PENDING  Incomplete  Urine culture     Status: None   Collection Time: 03/18/19  5:35 AM   Specimen: Urine, Random  Result Value Ref Range Status   Specimen Description   Final    URINE, RANDOM Performed at [facility] [address], [place], [place] [address]    Special Requests   Final    NONE Performed at [facility], [place] [address] [place], [place] [address]    Culture   Final    NO GROWTH Performed at [facility], [place] [address] [place], [place] [address]    Report Status 03/19/2019 FINAL  Final  MRSA PCR Screening     Status: None   Collection Time: 03/18/19  9:46 AM    Specimen: Nasal Mucosa; Nasopharyngeal  Result Value Ref Range Status   MRSA by PCR NEGATIVE NEGATIVE Final    Comment:        The GeneXpert MRSA Assay (FDA approved for NASAL specimens only), is one component of a comprehensive MRSA colonization surveillance program. It is not intended to diagnose MRSA infection nor to guide or monitor treatment for MRSA infections. Performed at [facility], [place] [address] [place],  [address]          Radiology Studies: No results found.      Scheduled Meds: . brinzolamide  1 drop Both Eyes TID  . chlorhexidine  15 mL Mouth Rinse BID  . Chlorhexidine Gluconate Cloth  6 each Topical Daily  . enoxaparin (LOVENOX) injection  40 mg Subcutaneous Q24H  . feeding supplement (PRO-STAT SUGAR FREE 64)  30 mL Per Tube Daily  . free water  100 mL Per Tube Q6H  . latanoprost  1 drop Both Eyes QHS  . mouth rinse  15 mL Mouth Rinse q12n4p  . sodium chloride flush  10-40 mL Intracatheter Q12H   Continuous Infusions: . sodium chloride Stopped (03/18/19 2252)  . ceFEPime (MAXIPIME) IV 2 g (03/21/19 0612)  . feeding supplement (OSMOLITE 1.5 CAL) 1,000 mL (03/20/19 1746)  . metronidazole 500 mg (03/21/19 [id number])     LOS: 3 days    Time spent: 25 minutes     Barb Merino, MD [facility] Pager [phone]

## 2019-03-21 NOTE — Progress Notes (Signed)
PHARMACY NOTE -  Cefepime  Pharmacy has been assisting with dosing of cefepime for sepsis.  Dosage remains stable at 2g IV q12 hr and need for further dosage adjustment appears unlikely at present given SCr at baseline  Pharmacy will sign off, following peripherally for culture results or dose adjustments. Please reconsult if a change in clinical status warrants re-evaluation of dosage.  Reuel Boom, PharmD, BCPS [phone] 03/21/2019, 12:08 PM

## 2019-03-21 NOTE — Progress Notes (Signed)
OT Cancellation Note  Patient Details Name: [name] MRN: [id number] DOB: 01-18-1929   Cancelled Treatment:    Reason Eval/Treat Not Completed: Other (comment).  Pt was at a SNF for rehab prior to this admission. Talked to PT, and pt needs total A at this time. Will defer OT intervention to next venue.  Chastelyn Athens 03/21/2019, 11:50 AM  Karsten Ro, OTR/L Acute Rehabilitation Services 03/21/2019

## 2019-03-21 NOTE — Care Management Important Message (Signed)
Important Message  Patient Details IM Letter given to Dessa Phi RN Case Manager to present to the Patient Name: [name] MRN: [id number] Date of Birth: [birth year]   Medicare Important Message Given:  Yes     Kerin Salen 03/21/2019, 9:21 AM

## 2019-03-21 NOTE — Evaluation (Signed)
Physical Therapy Evaluation Patient Details Name: [name] MRN: [id number] DOB: May 11, 1928 Today's Date: 03/21/2019   History of Present Illness  86 year old male with past medical history significant for glaucoma, hard of hearing, advanced dementia, BPH, GERD, hyperlipidemia, hypertension, recent diagnosis of lymphoma, recent PEG placement, recent COVID who presented to [facility] long ED following brief transition to SNF.  He was doing well after transition, but then developed fever, lethargy, and was transferred back to the hospital and admitted for sepsis.  Clinical Impression  Pt admitted with above diagnosis.  Pt currently with functional limitations due to the deficits listed below (see PT Problem List). Pt will benefit from skilled PT to increase their independence and safety with mobility to allow discharge to the venue listed below.  Pt very HOH and able answer simple yes/no questions.  Pt assisting a little with UE movement however did not assist with LE movement and grimacing occasionally.  Pt total care per RN at this time so did not attempt bed mobility today (need +2 or pt more interactive).  Pt admitted from SNF and plans to return to SNF per chart review.     Follow Up Recommendations SNF    Equipment Recommendations  None recommended by PT    Recommendations for Other Services       Precautions / Restrictions Precautions Precautions: Fall Precaution Comments: PEG      Mobility  Bed Mobility               General bed mobility comments: deferred, currently total care, not assisting with lower body movement exercises  Transfers                    Ambulation/Gait                Stairs            Wheelchair Mobility    Modified Rankin (Stroke Patients Only)       Balance                                             Pertinent Vitals/Pain Pain Assessment: Faces Faces Pain Scale: Hurts even more Pain Location:  with repositioning Pain Descriptors / Indicators: Grimacing Pain Intervention(s): Repositioned    Home Living Family/patient expects to be discharged to:: Skilled nursing facility                 Additional Comments: pt poor historian    Prior Function           Comments: recent admission and d/c to SNF     Hand Dominance        Extremity/Trunk Assessment   Upper Extremity Assessment Upper Extremity Assessment: Generalized weakness    Lower Extremity Assessment Lower Extremity Assessment: Generalized weakness(did not assist with any LE movement today)       Communication   Communication: HOH  Cognition Arousal/Alertness: Awake/alert Behavior During Therapy: WFL for tasks assessed/performed Overall Cognitive Status: History of cognitive impairments - at baseline                                 General Comments: pt with history of dementia, follows one step commands with multimodal cuing and answers yes/no questions appropriately      General Comments  Exercises Total Joint Exercises Ankle Circles/Pumps: PROM;10 reps;Both Heel Slides: PROM;Both;10 reps Hip ABduction/ADduction: PROM;Both;10 reps Low Level/ICU Exercises Shoulder Flexion: AAROM;Both;10 reps Elbow Flexion: AAROM;Both;10 reps Other Exercises Other Exercises: also performed gentle wrist and hand PROM for L UE edema, pt able to assist/resist more with R UE movement, only a little assist with L UE movement and none with wrist/hand   Assessment/Plan    PT Assessment Patient needs continued PT services  PT Problem List Decreased strength;Decreased mobility;Decreased activity tolerance;Decreased balance;Decreased knowledge of use of DME;Decreased cognition       PT Treatment Interventions DME instruction;Gait training;Stair training;Functional mobility training;Therapeutic activities;Therapeutic exercise;Patient/family education;Balance training    PT Goals (Current goals  can be found in the Care Plan section)  Acute Rehab PT Goals PT Goal Formulation: Patient unable to participate in goal setting Time For Goal Achievement: 04/04/19 Potential to Achieve Goals: Poor    Frequency Min 2X/week   Barriers to discharge        Co-evaluation               AM-PAC PT "6 Clicks" Mobility  Outcome Measure Help needed turning from your back to your side while in a flat bed without using bedrails?: Total Help needed moving from lying on your back to sitting on the side of a flat bed without using bedrails?: Total Help needed moving to and from a bed to a chair (including a wheelchair)?: Total Help needed standing up from a chair using your arms (e.g., wheelchair or bedside chair)?: Total Help needed to walk in hospital room?: Total Help needed climbing 3-5 steps with a railing? : Total 6 Click Score: 6    End of Session   Activity Tolerance: Patient tolerated treatment well Patient left: in bed;with call bell/phone within reach;with bed alarm set Nurse Communication: Mobility status PT Visit Diagnosis: Muscle weakness (generalized) (M62.81);Other abnormalities of gait and mobility (R26.89)    Time: [id number] PT Time Calculation (min) (ACUTE ONLY): 14 min   Charges:   PT Evaluation $PT Eval Low Complexity: 1 Low        Kati PT, DPT Acute Rehabilitation Services Office: [phone]   Trena Platt 03/21/2019, 12:37 PM

## 2019-03-22 LAB — CBC WITH DIFFERENTIAL/PLATELET
Abs Immature Granulocytes: 0.16 10*3/uL — ABNORMAL HIGH (ref 0.00–0.07)
Basophils Absolute: 0.1 10*3/uL (ref 0.0–0.1)
Basophils Relative: 1 %
Eosinophils Absolute: 0.4 10*3/uL (ref 0.0–0.5)
Eosinophils Relative: 2 %
HCT: 34.3 % — ABNORMAL LOW (ref 39.0–52.0)
Hemoglobin: 11.1 g/dL — ABNORMAL LOW (ref 13.0–17.0)
Immature Granulocytes: 1 %
Lymphocytes Relative: 15 %
Lymphs Abs: 2.9 10*3/uL (ref 0.7–4.0)
MCH: 31.4 pg (ref 26.0–34.0)
MCHC: 32.4 g/dL (ref 30.0–36.0)
MCV: 96.9 fL (ref 80.0–100.0)
Monocytes Absolute: 7.3 10*3/uL — ABNORMAL HIGH (ref 0.1–1.0)
Monocytes Relative: 38 %
Neutro Abs: 8.2 10*3/uL — ABNORMAL HIGH (ref 1.7–7.7)
Neutrophils Relative %: 43 %
Platelets: 283 10*3/uL (ref 150–400)
RBC: 3.54 MIL/uL — ABNORMAL LOW (ref 4.22–5.81)
RDW: 15.4 % (ref 11.5–15.5)
WBC: 19 10*3/uL — ABNORMAL HIGH (ref 4.0–10.5)
nRBC: 0 % (ref 0.0–0.2)

## 2019-03-22 LAB — BASIC METABOLIC PANEL
Anion gap: 6 (ref 5–15)
BUN: 40 mg/dL — ABNORMAL HIGH (ref 8–23)
CO2: 24 mmol/L (ref 22–32)
Calcium: 8.3 mg/dL — ABNORMAL LOW (ref 8.9–10.3)
Chloride: 107 mmol/L (ref 98–111)
Creatinine, Ser: 0.76 mg/dL (ref 0.61–1.24)
GFR calc Af Amer: >60 mL/min (ref >60)
GFR calc non Af Amer: >60 mL/min (ref >60)
Glucose, Bld: 138 mg/dL — ABNORMAL HIGH (ref 70–99)
Potassium: 4.2 mmol/L (ref 3.5–5.1)
Sodium: 137 mmol/L (ref 135–145)

## 2019-03-22 LAB — GLUCOSE, CAPILLARY
Glucose-Capillary: 103 mg/dL — ABNORMAL HIGH (ref 70–99)
Glucose-Capillary: 104 mg/dL — ABNORMAL HIGH (ref 70–99)
Glucose-Capillary: 105 mg/dL — ABNORMAL HIGH (ref 70–99)
Glucose-Capillary: 113 mg/dL — ABNORMAL HIGH (ref 70–99)
Glucose-Capillary: 122 mg/dL — ABNORMAL HIGH (ref 70–99)
Glucose-Capillary: 123 mg/dL — ABNORMAL HIGH (ref 70–99)
Glucose-Capillary: 123 mg/dL — ABNORMAL HIGH (ref 70–99)
Glucose-Capillary: 137 mg/dL — ABNORMAL HIGH (ref 70–99)

## 2019-03-22 MED ORDER — ALTEPLASE 2 MG IJ SOLR
2.0000 mg | Freq: Once | INTRAMUSCULAR | Status: AC
  Administered 2019-03-22: 2 mg
  Filled 2019-03-22: qty 2

## 2019-03-22 NOTE — Progress Notes (Signed)
PROGRESS NOTE    [name]  [id number] DOB: 25-Sep-1928 DOA: 03/18/2019 PCP: [name], MD    Brief Narrative:  86 year old male with dementia, hypertension, hyperlipidemia, GERD and BPH from skilled nursing facility presented with altered mental status and difficulty breathing. Recent extensive hospitalizations, 02/22/2019-02/2018 for COVID-19 pneumonia, neurosyphilis, extensive treatment with remdesivir, dexamethasone, IV penicillin for 2 weeks, also diagnosed with B-cell non-Hodgkin's lymphoma.  PEG tube placement and discharged to nursing home on 1/22 and presented back on 1/24 with above.  In the emergency room temperature 101, tachycardic, respiratory rate 27.  Blood pressure was low normal.  Oxygen 96% on room air.  WBC 13.7.  Urinalysis unremarkable.  Chest x-ray with residual opacities consistent with recent COVID-19 pneumonia.  Admitted with sepsis protocol from unknown source possibly aspiration.  1/27/ 2021: Patient was gradually improving, then had another episode of low-grade temperature, he become more lethargic, gurgly sound on breathing. Another aspiration event even if he is n.p.o.. Patient was brought to the emergency room 3 days ago with exactly same symptoms. Patient remains persistently lethargic with poor clearance of secretions.  WBC count worsening.    Assessment & Plan:   Principal Problem:   Sepsis ([place]) Active Problems:   Dyslipidemia   Glaucoma   Essential hypertension   Advanced dementia ([place])   Diffuse large cell non-Hodgkin's lymphoma ([place])   Stage IV pressure ulcer of sacral region ([place])  Sepsis present on admission: Intermittent aspiration pneumonia. recent hospitalization. PICC line in place.  With initial clinical improvement on cefepime and Flagyl, he had another similar event on 1/28. Continue NPO. Continue with cefepime and Flagyl. Vancomycin discontinued. Negative cultures so far.  Stage IV sacral decubitus ulcer: Present on  admission.  Local wound care.  Less likely source of infection.  Diffuse large cell non-Hodgkin's lymphoma: Followed by oncology.  Less likely a candidate for any treatment now given severe debility. Will have out patient follow up.  Advanced dementia/failure to thrive/dysphagia: Recently received PEG tube. Keep NPO. Administer medicine through PEG tube. Tube feeding at goal. DNR. Family still hoping for good functional outcome and improvement. Palliative care team consulted.   History of COVID-19 pneumonia: Completed treatment.  Outside of isolation window.  Hypertension: Blood pressures low on presentation.  Hold all antihypertensives for now.  Hypokalemia: Replaced.  Improved.  Call placed to patient's son Mr. Raymand Yenter.  Updated about patient's clinical status.  Patient has not done adequate clinical recovery, still fluctuating mentation and intermittently aspirating. Patient unable to eat anything by mouth.  I talked to the family that patient is probably not happy with what he is going through. Encouraged him to visit patient.  Patient's daughter will be here after her office hours. If family agrees, patient is hospice appropriate.  DVT prophylaxis: Lovenox subcu Code Status: DNR Family Communication: Patient's son on the phone. His sister is visiting patient in the hospital. Patient is intermittently aspirating, this may not get better. Disposition Plan: patient is from a skilled nursing facility. Anticipated DC to skilled nursing facility when adequate improvement. Barriers to discharge treated for active infection and sepsis.   Consultants:   Palliative medicine  Oncology  Procedures:   None  Antimicrobials:  Anti-infectives (From admission, onward)   Start     Dose/Rate Route Frequency Ordered Stop   03/19/19 0800  vancomycin (VANCOREADY) IVPB 1250 mg/250 mL  Status:  Discontinued     1,250 mg 166.7 mL/hr over 90 Minutes Intravenous Every 24 hours 03/18/19 0925  03/20/19  1008   03/18/19 1400  metroNIDAZOLE (FLAGYL) IVPB 500 mg     500 mg 100 mL/hr over 60 Minutes Intravenous Every 8 hours 03/18/19 0828     03/18/19 1400  ceFEPIme (MAXIPIME) 2 g in sodium chloride 0.9 % 100 mL IVPB     2 g 200 mL/hr over 30 Minutes Intravenous Every 8 hours 03/18/19 0928     03/18/19 0830  metroNIDAZOLE (FLAGYL) IVPB 500 mg  Status:  Discontinued     500 mg 100 mL/hr over 60 Minutes Intravenous Every 8 hours 03/18/19 0827 03/18/19 0828   03/18/19 0615  vancomycin (VANCOREADY) IVPB 1500 mg/300 mL     1,500 mg 150 mL/hr over 120 Minutes Intravenous  Once 03/18/19 0530 03/18/19 0958   03/18/19 0530  ceFEPIme (MAXIPIME) 2 g in sodium chloride 0.9 % 100 mL IVPB     2 g 200 mL/hr over 30 Minutes Intravenous  Once 03/18/19 0526 03/18/19 0648   03/18/19 0530  metroNIDAZOLE (FLAGYL) IVPB 500 mg     500 mg 100 mL/hr over 60 Minutes Intravenous  Once 03/18/19 0526 03/18/19 0749   03/18/19 0530  vancomycin (VANCOCIN) IVPB 1000 mg/200 mL premix  Status:  Discontinued     1,000 mg 200 mL/hr over 60 Minutes Intravenous  Once 03/18/19 0526 03/18/19 0530         Subjective: Patient seen and examined.  Overnight low-grade fever persist.  Still looks very lethargic, he responded somehow but no meaningful interaction.  Objective: Vitals:   03/22/19 0434 03/22/19 0543 03/22/19 0617 03/22/19 1229  BP: 120/69  118/63 (!) 108/59  Pulse: 85  85 86  Resp: (!) 24  (!) 25 (!) 23  Temp: 99 F (37.2 C)  100.3 F (37.9 C) 99.3 F (37.4 C)  TempSrc: Oral  Oral Oral  SpO2: 100%  96% 95%  Weight:  71.8 kg    Height:        Intake/Output Summary (Last 24 hours) at 03/22/2019 1428 Last data filed at 03/22/2019 1000 Gross per 24 hour  Intake 688.12 ml  Output 1425 ml  Net -736.88 ml   Filed Weights   03/21/19 0500 03/22/19 0431 03/22/19 0543  Weight: 75 kg 71.8 kg 71.8 kg    Examination:  General exam: Sick looking, less interactive. Respiratory system: Bilateral  conducted upper airway sounds.  Cardiovascular system: S1 & S2 heard, tachycardic. RRR.  Right leg edema 1+. Gastrointestinal system: Abdomen is nondistended, soft and nontender. No organomegaly or masses felt. Normal bowel sounds heard.  PEG tube in place infusing. Central nervous system: Not following commands. More lethargic and confused today.  Skin: No rashes, lesions, stage IV sacral decubitus present on admission.  Picture in the media section. Psychiatry: Judgement and insight appear compromised.  Mood & affect anxious and flat.    Data Reviewed: I have personally reviewed following labs and imaging studies  CBC: Recent Labs  Lab 03/18/19 0535 03/19/19 0329 03/20/19 0830 03/21/19 0424 03/22/19 0515  WBC 13.7* 14.6* 14.5* 16.8* 19.0*  NEUTROABS 8.3*  --  7.3 6.9 8.2*  HGB 11.9* 9.8* 10.6* 10.5* 11.1*  HCT 37.2* 30.6* 33.2* 33.0* 34.3*  MCV 97.4 97.8 97.1 96.5 96.9  PLT 291 287 337 307 [id number]   Basic Metabolic Panel: Recent Labs  Lab 03/18/19 0535 03/19/19 0329 03/20/19 0830 03/21/19 0424 03/22/19 1152  NA 135 135 137 136 137  K 4.7 3.4* 3.8 4.4 4.2  CL 103 107 110 108 107  CO2 25 23 21*  21* 24  GLUCOSE 114* 118* 135* 128* 138*  BUN 28* 22 26* 29* 40*  CREATININE 0.53* 0.40* 0.56* 0.76 0.76  CALCIUM 8.3* 8.0* 8.0* 8.2* 8.3*  MG  --   --  1.9  --   --   PHOS  --   --  2.4*  --   --    GFR: Estimated Creatinine Clearance: 57.6 mL/min (by C-G formula based on SCr of 0.76 mg/dL). Liver Function Tests: Recent Labs  Lab 03/18/19 0535 03/19/19 0329  AST 29 18  ALT 21 17  ALKPHOS 64 50  BILITOT 0.6 0.8  PROT 5.9* 4.7*  ALBUMIN 2.2* 1.7*   No results for input(s): LIPASE, AMYLASE in the last 168 hours. No results for input(s): AMMONIA in the last 168 hours. Coagulation Profile: Recent Labs  Lab 03/18/19 0535  INR 1.2   Cardiac Enzymes: No results for input(s): CKTOTAL, CKMB, CKMBINDEX, TROPONINI in the last 168 hours. BNP (last 3 results) No results for  input(s): PROBNP in the last 8760 hours. HbA1C: No results for input(s): HGBA1C in the last 72 hours. CBG: Recent Labs  Lab 03/21/19 2003 03/22/19 0007 03/22/19 0427 03/22/19 0721 03/22/19 1127  GLUCAP 110* 123* 123* 113* 122*   Lipid Profile: No results for input(s): CHOL, HDL, LDLCALC, TRIG, CHOLHDL, LDLDIRECT in the last 72 hours. Thyroid Function Tests: No results for input(s): TSH, T4TOTAL, FREET4, T3FREE, THYROIDAB in the last 72 hours. Anemia Panel: No results for input(s): VITAMINB12, FOLATE, FERRITIN, TIBC, IRON, RETICCTPCT in the last 72 hours. Sepsis Labs: Recent Labs  Lab 03/18/19 0535 03/18/19 1012 03/19/19 0329 03/20/19 0830  PROCALCITON  --  <0.10 0.11 <0.10  LATICACIDVEN 1.5  --   --   --     Recent Results (from the past 240 hour(s))  Culture, blood (Routine x 2)     Status: None (Preliminary result)   Collection Time: 03/18/19  5:35 AM   Specimen: BLOOD RIGHT HAND  Result Value Ref Range Status   Specimen Description   Final    BLOOD RIGHT HAND Performed at [facility], [place] [address], [place], [place] [address]    Special Requests   Final    BOTTLES DRAWN AEROBIC AND ANAEROBIC Blood Culture results may not be optimal due to an inadequate volume of blood received in culture bottles Performed at [place] [address], [place], [place] [address]    Culture   Final    NO GROWTH 4 DAYS Performed at [facility], [place] [address], [place], [place] [address]    Report Status PENDING  Incomplete  Culture, blood (Routine x 2)     Status: None (Preliminary result)   Collection Time: 03/18/19  5:35 AM   Specimen: BLOOD LEFT FOREARM  Result Value Ref Range Status   Specimen Description   Final    BLOOD LEFT FOREARM Performed at [place] [address], [place], [place] [address]    Special Requests   Final    BOTTLES DRAWN AEROBIC AND ANAEROBIC Blood Culture adequate  volume Performed at [place] [address] [place], [place] [address]    Culture   Final    NO GROWTH 4 DAYS Performed at [facility], [place] [address] [place],  [address]    Report Status PENDING  Incomplete  Urine culture     Status: None   Collection Time: 03/18/19  5:35 AM   Specimen: Urine,  Random  Result Value Ref Range Status   Specimen Description   Final    URINE, RANDOM Performed at [place] [address], [place], [place] [address]    Special Requests   Final    NONE Performed at [facility], [place] [address], [place], [place] [address]    Culture   Final    NO GROWTH Performed at [facility], [place] [address], [place], [place] [address]    Report Status 03/19/2019 FINAL  Final  MRSA PCR Screening     Status: None   Collection Time: 03/18/19  9:46 AM   Specimen: Nasal Mucosa; Nasopharyngeal  Result Value Ref Range Status   MRSA by PCR NEGATIVE NEGATIVE Final    Comment:        The GeneXpert MRSA Assay (FDA approved for NASAL specimens only), is one component of a comprehensive MRSA colonization surveillance program. It is not intended to diagnose MRSA infection nor to guide or monitor treatment for MRSA infections. Performed at [facility], [place] [address], [place], [place] [address]          Radiology Studies: DG CHEST PORT 1 VIEW  Result Date: 03/21/2019 CLINICAL DATA:  Recurrent aspiration. EXAM: PORTABLE CHEST 1 VIEW COMPARISON:  Chest x-ray dated March 18, 2019. FINDINGS: Unchanged right upper extremity PICC line with tip at the cavoatrial junction. The heart size and mediastinal contours are within normal limits. Normal pulmonary vascularity. Similar appearing streaky opacities in the central right upper lobe and both lung bases. Unchanged elevation of the right hemidiaphragm. No pleural effusion or pneumothorax. No acute osseous abnormality.  IMPRESSION: 1. Similar appearing streaky airspace disease in the central right upper lobe and both lung bases. No new consolidation. Electronically Signed   By: Titus Dubin M.D.   On: 03/21/2019 14:07        Scheduled Meds: . brinzolamide  1 drop Both Eyes TID  . chlorhexidine  15 mL Mouth Rinse BID  . Chlorhexidine Gluconate Cloth  6 each Topical Daily  . enoxaparin (LOVENOX) injection  40 mg Subcutaneous Q24H  . feeding supplement (PRO-STAT SUGAR FREE 64)  30 mL Per Tube Daily  . free water  100 mL Per Tube Q6H  . latanoprost  1 drop Both Eyes QHS  . mouth rinse  15 mL Mouth Rinse q12n4p  . sodium chloride flush  10-40 mL Intracatheter Q12H   Continuous Infusions: . sodium chloride Stopped (03/18/19 2252)  . ceFEPime (MAXIPIME) IV 2 g (03/22/19 0542)  . feeding supplement (OSMOLITE 1.5 CAL) 1,000 mL (03/22/19 0530)  . metronidazole 500 mg (03/22/19 [id number])     LOS: 4 days    Time spent: 25 minutes     Barb Merino, MD [facility] Pager [phone]

## 2019-03-22 NOTE — Progress Notes (Signed)
Palliative Care Progress Note  Reason for encounter: GOC in light of likely recurrent aspiration with recent COVID 19 infection and new diagnosis of lymphoma  Events of last 24 hours noted including ? Aspiration event.  I called and discussed with bedside RN and we reviewed his day with increased somnolence.  I called and spoke briefly with his son, [name].  [name] reports having the opportunity to discuss with Dr. Sloan Leiter today and understanding concern with recurrent aspiration.  His sister is coming this evening to assess [name].  - Will continue to reach out and support family while they process options moving forward for [name].  This remains a difficult situation with limited options moving forward, particularly if recurrent aspiration now part of the picture.  Total time: 15 minutes  Greater than 50%  of this time was spent counseling and coordinating care related to the above assessment and plan.  Micheline Rough, MD [facility] Team [phone]

## 2019-03-23 ENCOUNTER — Inpatient Hospital Stay: Payer: Medicare Other

## 2019-03-23 ENCOUNTER — Inpatient Hospital Stay: Payer: Medicare Other | Admitting: Oncology

## 2019-03-23 DIAGNOSIS — H9190 Unspecified hearing loss, unspecified ear: Secondary | ICD-10-CM

## 2019-03-23 DIAGNOSIS — M4726 Other spondylosis with radiculopathy, lumbar region: Secondary | ICD-10-CM | POA: Diagnosis not present

## 2019-03-23 DIAGNOSIS — G3184 Mild cognitive impairment, so stated: Secondary | ICD-10-CM | POA: Diagnosis not present

## 2019-03-23 DIAGNOSIS — M4126 Other idiopathic scoliosis, lumbar region: Secondary | ICD-10-CM | POA: Diagnosis not present

## 2019-03-23 DIAGNOSIS — M5116 Intervertebral disc disorders with radiculopathy, lumbar region: Secondary | ICD-10-CM | POA: Diagnosis not present

## 2019-03-23 DIAGNOSIS — H409 Unspecified glaucoma: Secondary | ICD-10-CM

## 2019-03-23 DIAGNOSIS — Z87891 Personal history of nicotine dependence: Secondary | ICD-10-CM

## 2019-03-23 DIAGNOSIS — I10 Essential (primary) hypertension: Secondary | ICD-10-CM | POA: Diagnosis not present

## 2019-03-23 LAB — CBC WITH DIFFERENTIAL/PLATELET
Abs Immature Granulocytes: 0 10*3/uL (ref 0.00–0.07)
Basophils Absolute: 0 10*3/uL (ref 0.0–0.1)
Basophils Relative: 0 %
Blasts: 16 %
Eosinophils Absolute: 0.4 10*3/uL (ref 0.0–0.5)
Eosinophils Relative: 2 %
HCT: 34.1 % — ABNORMAL LOW (ref 39.0–52.0)
Hemoglobin: 10.7 g/dL — ABNORMAL LOW (ref 13.0–17.0)
Lymphocytes Relative: 11 %
Lymphs Abs: 2.4 10*3/uL (ref 0.7–4.0)
MCH: 30.5 pg (ref 26.0–34.0)
MCHC: 31.4 g/dL (ref 30.0–36.0)
MCV: 97.2 fL (ref 80.0–100.0)
Monocytes Absolute: 1.3 10*3/uL — ABNORMAL HIGH (ref 0.1–1.0)
Monocytes Relative: 6 %
Neutro Abs: 14.2 10*3/uL — ABNORMAL HIGH (ref 1.7–7.7)
Neutrophils Relative %: 65 %
Platelets: 262 10*3/uL (ref 150–400)
RBC: 3.51 MIL/uL — ABNORMAL LOW (ref 4.22–5.81)
RDW: 15.6 % — ABNORMAL HIGH (ref 11.5–15.5)
WBC: 21.9 10*3/uL — ABNORMAL HIGH (ref 4.0–10.5)
nRBC: 0 % (ref 0.0–0.2)

## 2019-03-23 LAB — GLUCOSE, CAPILLARY
Glucose-Capillary: 127 mg/dL — ABNORMAL HIGH (ref 70–99)
Glucose-Capillary: 122 mg/dL — ABNORMAL HIGH (ref 70–99)
Glucose-Capillary: 126 mg/dL — ABNORMAL HIGH (ref 70–99)
Glucose-Capillary: 109 mg/dL — ABNORMAL HIGH (ref 70–99)
Glucose-Capillary: 127 mg/dL — ABNORMAL HIGH (ref 70–99)

## 2019-03-23 LAB — CULTURE, BLOOD (ROUTINE X 2)
Culture: NO GROWTH
Culture: NO GROWTH
Special Requests: ADEQUATE

## 2019-03-23 LAB — PATHOLOGIST SMEAR REVIEW

## 2019-03-23 MED ORDER — SODIUM CHLORIDE 0.9 % IV SOLN
2.0000 g | Freq: Two times a day (BID) | INTRAVENOUS | Status: DC
  Administered 2019-03-23 – 2019-03-26 (×6): 2 g via INTRAVENOUS
  Filled 2019-03-23 (×7): qty 2

## 2019-03-23 NOTE — Care Management Important Message (Signed)
Important Message  Patient Details IM Letter given to Lonell Grandchild RN Case Manager to present to the Patient Name: [name] MRN: [id number] Date of Birth: [birth year]   Medicare Important Message Given:  Yes     Kerin Salen 03/23/2019, 12:24 PM

## 2019-03-23 NOTE — Progress Notes (Signed)
Palliative Care Progress Note  I called and spoke briefly with his son, [name].  [name] reports having the opportunity to discuss with Dr. Sloan Leiter today and planning to discuss further with his mother tomorrow.  - [name] has my contact info and will call if we can be of assistance in answering questions or facilitating conversation with this mother.  Micheline Rough, MD [place] Palliative Medicine Team [phone]  NO CHARGE NOTE

## 2019-03-23 NOTE — Progress Notes (Signed)
PROGRESS NOTE    [name]  XBJ:[id number] DOB: March 01, 1928 DOA: 03/18/2019 PCP: [name], MD    Brief Narrative:  86 year old male with dementia, hypertension, hyperlipidemia, GERD and BPH from skilled nursing facility presented with altered mental status and difficulty breathing. Recent extensive hospitalizations, 02/22/2019-02/2018 for COVID-19 pneumonia, neurosyphilis, extensive treatment with remdesivir, dexamethasone, IV penicillin for 2 weeks, also diagnosed with B-cell non-Hodgkin's lymphoma.  PEG tube placement and discharged to nursing home on 1/22 and presented back on 1/24 with above.  In the emergency room temperature 101, tachycardic, respiratory rate 27.  Blood pressure was low normal.  Oxygen 96% on room air.  WBC 13.7.  Urinalysis unremarkable.  Chest x-ray with residual opacities consistent with recent COVID-19 pneumonia.  Admitted with sepsis protocol from unknown source possibly aspiration.  1/27/ 2021: Patient was gradually stabilizing, then had another episode of low-grade temperature, he became more lethargic, gurgly sound on breathing. Another aspiration event even if he is n.p.o.. Patient was brought to the emergency room 3 days ago with exactly same symptoms. Patient remains persistently lethargic with poor clearance of secretions.  WBC count worsening. 1/29: no change in condition, remains lethargic with no meaningful improvement.     Assessment & Plan:   Principal Problem:   Sepsis ([place]) Active Problems:   Dyslipidemia   Glaucoma   Essential hypertension   Advanced dementia ([place])   Diffuse large cell non-Hodgkin's lymphoma ([place])   Stage IV pressure ulcer of sacral region ([place])  Sepsis present on admission: Intermittent aspiration pneumonia. recent hospitalization. PICC line in place. With initial clinical improvement on cefepime and Flagyl, he had another similar event on 1/28. Continue NPO. Continue with cefepime and Flagyl. Vancomycin  discontinued.  No positive cultures.  Stage IV sacral decubitus ulcer: Present on admission.  Local wound care.  Less likely source of infection.  Diffuse large cell non-Hodgkin's lymphoma: Followed by oncology.  Less likely a candidate for any treatment now given severe debility. Will have out patient follow up.  Advanced dementia/failure to thrive/dysphagia: Recently received PEG tube. Keep NPO. Administer medicine through PEG tube. Tube feeding at goal. DNR.   History of COVID-19 pneumonia: Completed treatment.  Outside of isolation window.  Hypertension: Blood pressures low on presentation.  Hold all antihypertensives for now.  Hypokalemia: Replaced.  Improved.  Advance care planning:  Patient's wife had signed patient's son Mr. Rasheed Welty has contact person and talking to him and updating every day.   Met patient's daughter, Ms. Debbie at the bedside on 1/28 and discussed about poor outcome.   1/29, call placed to Mr. [name] and updated.   Recommended comfort care and hospice given patient's poor clinical improvement and less chances of meaningful recovery and current suffering. Patient son and daughter both agreed and they will be talking to their mother in person today or tomorrow to help her make decisions. We will coordinate with palliative care.  DVT prophylaxis: Lovenox subcu Code Status: DNR Family Communication: Patient's son on the phone.  Patient's daughter at the bedside, 03/22/2019 evening. Disposition Plan: patient is from a skilled nursing facility. Anticipated DC to skilled nursing facility when adequate improvement. Barriers to discharge treated for active infection and sepsis. If patient's family agree, recommend hospice level of care.   Consultants:   Palliative medicine  Oncology  Procedures:   None  Antimicrobials:  Anti-infectives (From admission, onward)   Start     Dose/Rate Route Frequency Ordered Stop   03/23/19 2200  ceFEPIme (MAXIPIME) 2 g  in  sodium chloride 0.9 % 100 mL IVPB     2 g 200 mL/hr over 30 Minutes Intravenous Every 12 hours 03/23/19 0955     03/19/19 0800  vancomycin (VANCOREADY) IVPB 1250 mg/250 mL  Status:  Discontinued     1,250 mg 166.7 mL/hr over 90 Minutes Intravenous Every 24 hours 03/18/19 0925 03/20/19 1008   03/18/19 1400  metroNIDAZOLE (FLAGYL) IVPB 500 mg     500 mg 100 mL/hr over 60 Minutes Intravenous Every 8 hours 03/18/19 0828     03/18/19 1400  ceFEPIme (MAXIPIME) 2 g in sodium chloride 0.9 % 100 mL IVPB  Status:  Discontinued     2 g 200 mL/hr over 30 Minutes Intravenous Every 8 hours 03/18/19 0928 03/23/19 0955   03/18/19 0830  metroNIDAZOLE (FLAGYL) IVPB 500 mg  Status:  Discontinued     500 mg 100 mL/hr over 60 Minutes Intravenous Every 8 hours 03/18/19 0827 03/18/19 0828   03/18/19 0615  vancomycin (VANCOREADY) IVPB 1500 mg/300 mL     1,500 mg 150 mL/hr over 120 Minutes Intravenous  Once 03/18/19 0530 03/18/19 0958   03/18/19 0530  ceFEPIme (MAXIPIME) 2 g in sodium chloride 0.9 % 100 mL IVPB     2 g 200 mL/hr over 30 Minutes Intravenous  Once 03/18/19 0526 03/18/19 0648   03/18/19 0530  metroNIDAZOLE (FLAGYL) IVPB 500 mg     500 mg 100 mL/hr over 60 Minutes Intravenous  Once 03/18/19 0526 03/18/19 0749   03/18/19 0530  vancomycin (VANCOCIN) IVPB 1000 mg/200 mL premix  Status:  Discontinued     1,000 mg 200 mL/hr over 60 Minutes Intravenous  Once 03/18/19 0526 03/18/19 0530         Subjective: Patient seen and examined.  Almost all of the time he is lethargic and sleepy.  Wakes up to the stimulation, responds okay and goes back to sleep. A lot of oral secretions present.  Objective: Vitals:   03/22/19 1229 03/22/19 2014 03/23/19 0415 03/23/19 1000  BP: (!) 108/59 118/65 116/60 115/62  Pulse: 86 93 96 92  Resp: (!) _0 Temp: 99.3 F (37.4 C) 98.1 F (36.7 C) 99.5 F (37.5 C) 99.1 F (37.3 C)  TempSrc: Oral Oral Oral Axillary  SpO2: 95% 94% 97% 95%  Weight:   75.7 kg    Height:        Intake/Output Summary (Last 24 hours) at 03/23/2019 1159 Last data filed at 03/23/2019 1000 Gross per 24 hour  Intake 540.97 ml  Output 750 ml  Net -209.03 ml   Filed Weights   03/22/19 0431 03/22/19 0543 03/23/19 0415  Weight: 71.8 kg 71.8 kg 75.7 kg    Examination:  General exam: Sick looking, minimally interactive.  Severely debilitated and bedbound. Respiratory system: Bilateral conducted upper airway sounds.  Cardiovascular system: S1 & S2 heard, tachycardic. RRR.   Gastrointestinal system: Abdomen is nondistended, soft and nontender. No organomegaly or masses felt. Normal bowel sounds heard.  PEG tube in place infusing. Central nervous system: Not following commands.  Mostly lethargic.  Follows simple commands.   Skin: No rashes, lesions, stage IV sacral decubitus present on admission.  Picture in the media section. Psychiatry: Judgement and insight appear compromised.  Mood & affect anxious and flat.    Data Reviewed: I have personally reviewed following labs and imaging studies  CBC: Recent Labs  Lab 03/18/19 0535 03/18/19 0535 03/19/19 0329 03/20/19 0830 03/21/19 0424 03/22/19 0515 03/23/19 0404  WBC 13.7*   < > 14.6* 14.5* 16.8* 19.0* 21.9*  NEUTROABS 8.3*  --   --  7.3 6.9 8.2* 14.2*  HGB 11.9*   < > 9.8* 10.6* 10.5* 11.1* 10.7*  HCT 37.2*   < > 30.6* 33.2* 33.0* 34.3* 34.1*  MCV 97.4   < > 97.8 97.1 96.5 96.9 97.2  PLT 291   < > 287 337 307 283 262   < > = values in this interval not displayed.   Basic Metabolic Panel: Recent Labs  Lab 03/18/19 0535 03/19/19 0329 03/20/19 0830 03/21/19 0424 03/22/19 1152  NA 135 135 137 136 137  K 4.7 3.4* 3.8 4.4 4.2  CL 103 107 110 108 107  CO2 25 23 21* 21* 24  GLUCOSE 114* 118* 135* 128* 138*  BUN 28* 22 26* 29* 40*  CREATININE 0.53* 0.40* 0.56* 0.76 0.76  CALCIUM 8.3* 8.0* 8.0* 8.2* 8.3*  MG  --   --  1.9  --   --   PHOS  --   --  2.4*  --   --    GFR: Estimated Creatinine Clearance:  57.6 mL/min (by C-G formula based on SCr of 0.76 mg/dL). Liver Function Tests: Recent Labs  Lab 03/18/19 0535 03/19/19 0329  AST 29 18  ALT 21 17  ALKPHOS 64 50  BILITOT 0.6 0.8  PROT 5.9* 4.7*  ALBUMIN 2.2* 1.7*   No results for input(s): LIPASE, AMYLASE in the last 168 hours. No results for input(s): AMMONIA in the last 168 hours. Coagulation Profile: Recent Labs  Lab 03/18/19 0535  INR 1.2   Cardiac Enzymes: No results for input(s): CKTOTAL, CKMB, CKMBINDEX, TROPONINI in the last 168 hours. BNP (last 3 results) No results for input(s): PROBNP in the last 8760 hours. HbA1C: No results for input(s): HGBA1C in the last 72 hours. CBG: Recent Labs  Lab 03/22/19 1706 03/22/19 1952 03/22/19 2001 03/22/19 2353 03/23/19 0350  GLUCAP 105* 103* 104* 137* 127*   Lipid Profile: No results for input(s): CHOL, HDL, LDLCALC, TRIG, CHOLHDL, LDLDIRECT in the last 72 hours. Thyroid Function Tests: No results for input(s): TSH, T4TOTAL, FREET4, T3FREE, THYROIDAB in the last 72 hours. Anemia Panel: No results for input(s): VITAMINB12, FOLATE, FERRITIN, TIBC, IRON, RETICCTPCT in the last 72 hours. Sepsis Labs: Recent Labs  Lab 03/18/19 0535 03/18/19 1012 03/19/19 0329 03/20/19 0830  PROCALCITON  --  <0.10 0.11 <0.10  LATICACIDVEN 1.5  --   --   --     Recent Results (from the past 240 hour(s))  Culture, blood (Routine x 2)     Status: None   Collection Time: 03/18/19  5:35 AM   Specimen: BLOOD RIGHT HAND  Result Value Ref Range Status   Specimen Description   Final    BLOOD RIGHT HAND Performed at [facility] [address], [place], [place] [address]    Special Requests   Final    BOTTLES DRAWN AEROBIC AND ANAEROBIC Blood Culture results may not be optimal due to an inadequate volume of blood received in culture bottles Performed at [facility] [address] [place], [place] [address]    Culture   Final    NO GROWTH 5  DAYS Performed at [facility], [place] [address] [place], [place] [address]    Report Status 03/23/2019 FINAL  Final  Culture, blood (Routine x 2)     Status: None   Collection Time: 03/18/19  5:35 AM  Specimen: BLOOD LEFT FOREARM  Result Value Ref Range Status   Specimen Description   Final    BLOOD LEFT FOREARM Performed at [facility] [address] [place], [place] [address]    Special Requests   Final    BOTTLES DRAWN AEROBIC AND ANAEROBIC Blood Culture adequate volume Performed at [facility] [address] [place], [place] [address]    Culture   Final    NO GROWTH 5 DAYS Performed at [facility], [place] [address] [place], [place] [address]    Report Status 03/23/2019 FINAL  Final  Urine culture     Status: None   Collection Time: 03/18/19  5:35 AM   Specimen: Urine, Random  Result Value Ref Range Status   Specimen Description   Final    URINE, RANDOM Performed at [facility] [address], [place], [place] [address]    Special Requests   Final    NONE Performed at [facility], [place] [address], [place], [place] [address]    Culture   Final    NO GROWTH Performed at [facility], [place] [address] [place],  [address]    Report Status 03/19/2019 FINAL  Final  MRSA PCR Screening     Status: None   Collection Time: 03/18/19  9:46 AM   Specimen: Nasal Mucosa; Nasopharyngeal  Result Value Ref Range Status   MRSA by PCR NEGATIVE NEGATIVE Final    Comment:        The GeneXpert MRSA Assay (FDA approved for NASAL specimens only), is one component of a comprehensive MRSA colonization surveillance program. It is not intended to diagnose MRSA infection nor to guide or monitor treatment for MRSA infections. Performed at [facility], [place] [address] [place],  [address]          Radiology Studies: DG CHEST PORT 1 VIEW  Result  Date: 03/21/2019 CLINICAL DATA:  Recurrent aspiration. EXAM: PORTABLE CHEST 1 VIEW COMPARISON:  Chest x-ray dated March 18, 2019. FINDINGS: Unchanged right upper extremity PICC line with tip at the cavoatrial junction. The heart size and mediastinal contours are within normal limits. Normal pulmonary vascularity. Similar appearing streaky opacities in the central right upper lobe and both lung bases. Unchanged elevation of the right hemidiaphragm. No pleural effusion or pneumothorax. No acute osseous abnormality. IMPRESSION: 1. Similar appearing streaky airspace disease in the central right upper lobe and both lung bases. No new consolidation. Electronically Signed   By: Titus Dubin M.D.   On: 03/21/2019 14:07        Scheduled Meds: . brinzolamide  1 drop Both Eyes TID  . chlorhexidine  15 mL Mouth Rinse BID  . Chlorhexidine Gluconate Cloth  6 each Topical Daily  . enoxaparin (LOVENOX) injection  40 mg Subcutaneous Q24H  . feeding supplement (PRO-STAT SUGAR FREE 64)  30 mL Per Tube Daily  . free water  100 mL Per Tube Q6H  . latanoprost  1 drop Both Eyes QHS  . mouth rinse  15 mL Mouth Rinse q12n4p  . sodium chloride flush  10-40 mL Intracatheter Q12H   Continuous Infusions: . sodium chloride Stopped (03/18/19 2252)  . ceFEPime (MAXIPIME) IV    . feeding supplement (OSMOLITE 1.5 CAL) 1,000 mL (03/22/19 0530)  . metronidazole 500 mg (03/23/19 0535)     LOS: 5 days    Time spent: 25 minutes     Barb Merino, MD [facility] Pager [phone]

## 2019-03-24 ENCOUNTER — Inpatient Hospital Stay (HOSPITAL_COMMUNITY): Payer: Medicare Other

## 2019-03-24 DIAGNOSIS — J690 Pneumonitis due to inhalation of food and vomit: Secondary | ICD-10-CM | POA: Diagnosis present

## 2019-03-24 DIAGNOSIS — R1310 Dysphagia, unspecified: Secondary | ICD-10-CM

## 2019-03-24 DIAGNOSIS — A419 Sepsis, unspecified organism: Secondary | ICD-10-CM | POA: Diagnosis present

## 2019-03-24 LAB — CBC WITH DIFFERENTIAL/PLATELET
Abs Immature Granulocytes: 0.2 10*3/uL — ABNORMAL HIGH (ref 0.00–0.07)
Basophils Absolute: 0.1 10*3/uL (ref 0.0–0.1)
Basophils Relative: 1 %
Eosinophils Absolute: 0.4 10*3/uL (ref 0.0–0.5)
Eosinophils Relative: 2 %
HCT: 33.5 % — ABNORMAL LOW (ref 39.0–52.0)
Hemoglobin: 10.5 g/dL — ABNORMAL LOW (ref 13.0–17.0)
Immature Granulocytes: 1 %
Lymphocytes Relative: 19 %
Lymphs Abs: 4.4 10*3/uL — ABNORMAL HIGH (ref 0.7–4.0)
MCH: 30.9 pg (ref 26.0–34.0)
MCHC: 31.3 g/dL (ref 30.0–36.0)
MCV: 98.5 fL (ref 80.0–100.0)
Monocytes Absolute: 10.4 10*3/uL — ABNORMAL HIGH (ref 0.1–1.0)
Monocytes Relative: 42 %
Neutro Abs: 8.1 10*3/uL — ABNORMAL HIGH (ref 1.7–7.7)
Neutrophils Relative %: 35 %
Platelets: 259 10*3/uL (ref 150–400)
RBC: 3.4 MIL/uL — ABNORMAL LOW (ref 4.22–5.81)
RDW: 16 % — ABNORMAL HIGH (ref 11.5–15.5)
WBC: 23.6 10*3/uL — ABNORMAL HIGH (ref 4.0–10.5)
nRBC: 0.1 % (ref 0.0–0.2)

## 2019-03-24 LAB — GLUCOSE, CAPILLARY
Glucose-Capillary: 146 mg/dL — ABNORMAL HIGH (ref 70–99)
Glucose-Capillary: 132 mg/dL — ABNORMAL HIGH (ref 70–99)
Glucose-Capillary: 129 mg/dL — ABNORMAL HIGH (ref 70–99)
Glucose-Capillary: 138 mg/dL — ABNORMAL HIGH (ref 70–99)
Glucose-Capillary: 122 mg/dL — ABNORMAL HIGH (ref 70–99)
Glucose-Capillary: 127 mg/dL — ABNORMAL HIGH (ref 70–99)

## 2019-03-24 MED ORDER — LORAZEPAM 2 MG/ML IJ SOLN: 0.5000 mg | INTRAMUSCULAR | Status: DC | PRN

## 2019-03-24 MED ORDER — SODIUM CHLORIDE 0.9 % IV BOLUS
500.0000 mL | Freq: Once | INTRAVENOUS | Status: AC
  Administered 2019-03-24: 500 mL via INTRAVENOUS

## 2019-03-24 MED ORDER — HYDROMORPHONE HCL 1 MG/ML IJ SOLN: 0.5000 mg | INTRAMUSCULAR | Status: DC | PRN

## 2019-03-24 NOTE — Progress Notes (Addendum)
Palliative Care Progress Note  Reason for encounter: GOC in light of likely recurrent aspiration with recent COVID 19 infection and new diagnosis of lymphoma  Events of last 24 hours noted including another aspiration event this morning.  He has been spiking fevers despite antibiotics.  I called and discussed with bedside RN and we reviewed his day with aspiration event then increased somnolence.  I called and spoke with his son, [name].  [name] and his sister met with their mother (patient's surrogate decision maker) this morning to discuss the fact that his condition is not improving despite maximal medical interventions.  [name] reports that his mother clearly stated that she does not want Mr. Strahm to suffer, however, she needs time to process information before making decision about de-escalating any of his current care.  At that point, Mr. Bielby children offered to call me to facilitate answering any questions his wife may have.  She declined calling at that point and indicated she has no questions, but rather she needs time to process next steps.    I talked with [name] about Mr. Coykendall continued decline with another aspiration event this morning.  Discussed with him the fact that he continues to spike high fevers (101-degrees Fahrenheit) despite antibiotics and Tylenol.  I let [name] know that I am concerned he is going to continue to worsen and die despite any medical interventions including continuation of current antibiotics.  [name] expressed understanding this and we discussed options moving forward of continuation of current care, continuing current care with addition of medication for any pain, shortness of breath, or anxiety, or transitioning to full comfort care.  We discussed the benefit of transition to full comfort care would be less restrictive visitation policy as he would fall under end-of-life/comfort care visitation restrictions.  At this point, family would like to continue with  current medical interventions including IV antibiotics while also having medications available if needed for pain, anxiety, or shortness of breath if they arise.  At this point, he appears comfortable.  I do want medications to be available if needs arise and therefore will place order for low doses of medication as needed.  There may come a point in the near future where family would like to transition to full comfort.  At this point, however, his wife needs more time to consider options for care moving forward.  -I expressed to patient's son that I believe we are approaching end-of-life for Mr. Milian regardless of interventions moving forward.  He continues to work in conjunction with his sister to help his mother process the fact that Mr. Ashby is not going to recover.  At this point, plan will be for continuation of current interventions while also making addition of medication for any pain, shortness of breath or anxiety he may experience.  I attempted to be as clear as possible with his son that I believe time is limited particularly now that he is spiking a fever despite IV antibiotics. -We will continue to follow and support family as best we can.  Total time: 40 minutes  Greater than 50%  of this time was spent counseling and coordinating care related to the above assessment and plan.  Micheline Rough, MD [facility] [phone]

## 2019-03-24 NOTE — Progress Notes (Signed)
   03/24/19 1311  MEWS Score  Temp (!) 101.5 F (38.6 C)  BP 136/74  Pulse Rate 96  Resp (!) 27  SpO2 95 %  O2 Device Room Air  MEWS Score  MEWS Temp 2  MEWS Systolic 0  MEWS Pulse 0  MEWS RR 2  MEWS LOC 0  MEWS Score 4  MEWS Score Color Red  MEWS Assessment  Is this an acute change? No  Provider Notification  Provider Name/Title Barb Merino  Date Provider Notified 03/24/19  Time Provider Notified 1330  Notification Type Page  Notification Reason Other (Comment) (Not an acute change, Meds given)  Response Other (Comment) (No new orders, Get temp down with PRN)  Date of Provider Response 03/24/19  Time of Provider Response 7   Notified MD of [place] which is not an acute change. Medication given to lower tempature. Will continue to monitor patient closely.

## 2019-03-24 NOTE — Progress Notes (Signed)
PROGRESS NOTE    [name]  VZC:[id number] DOB: [birth year] DOA: 03/18/2019 PCP: [name], MD    Brief Narrative:  86 year old male with dementia, hypertension, hyperlipidemia, GERD and BPH from skilled nursing facility presented with altered mental status and difficulty breathing. Recent extensive hospitalizations, 02/22/2019-03/14/2019 for COVID-19 pneumonia, neurosyphilis, extensive treatment with remdesivir, dexamethasone, IV penicillin for 2 weeks, also diagnosed with B-cell non-Hodgkin's lymphoma.  PEG tube placement and discharged to nursing home on 1/22 and presented back on 1/24 with above.  In the emergency room temperature 101, tachycardic, respiratory rate 27.  Blood pressure was low normal.  Oxygen 96% on room air.  WBC 13.7.  Urinalysis unremarkable.  Chest x-ray with residual opacities consistent with recent COVID-19 pneumonia.  Admitted with sepsis protocol from unknown source possibly aspiration.  1/27/ 2021: Patient was gradually stabilizing, then had another episode of low-grade temperature, he became more lethargic, gurgly sound on breathing. Another aspiration event even if he is n.p.o.. Patient was brought to the emergency room 3 days ago with exactly same symptoms. Patient remains persistently lethargic with poor clearance of secretions.  WBC count worsening. 1/29: no change in condition, remains lethargic with no meaningful improvement.  Remains on IV antibiotics and tube feeding. 1/30: [place]eveloped temperature 101.5 even on antibiotics.  Large amount of oral secretions.   Assessment & Plan:   Principal Problem:   Sepsis ([place]) Active Problems:   Dyslipidemia   Glaucoma   Essential hypertension   Advanced dementia ([place])   Diffuse large cell non-Hodgkin's lymphoma ([place])   Stage IV pressure ulcer of sacral region ([place])  Sepsis present on admission: Intermittent aspiration pneumonia. recent hospitalization. PICC line in place.  Cultures negative.     Recurrent aspiration, high fever today.   Previously on vancomycin, cefepime and Flagyl.  Vancomycin discontinued.   Continue on cefepime and Flagyl.   Patient will probably not survive this admission, ongoing discussion with patient's family about comfort care.   Meantime, continue antibiotics and tube feeding with all supportive care.   DNR.    Stage IV sacral decubitus ulcer: Present on admission.  Local wound care.  Less likely source of infection.  Diffuse large cell non-Hodgkin's lymphoma: Followed by oncology.  Less likely a candidate for any treatment now given severe debility. With patient's current condition, he is less likely to make any meaningful recovery.  Advanced dementia/failure to thrive/dysphagia: Recently received PEG tube. Keep NPO. Administer medicine through PEG tube. Tube feeding at goal. DNR.   History of COVID-19 pneumonia: Completed treatment.  Outside of isolation window.  Hypertension: Blood pressures low on presentation.  Holding all antihypertensives for now.  Hypokalemia: Replaced.  Improved.  Advance care planning:  Patient's wife had assigned patient's son Mr. My Madariaga has contact person and talking to him and updating every day.   Met patient's daughter, Ms. Debbie at the bedside on 1/28 and discussed about poor outcome.   Patient's 2 children, his wife during conference with palliative care physician today. I have recommended comfort care and hospice given patient's poor clinical improvement, less chances of meaningful recovery and current suffering.  DVT prophylaxis: Lovenox subcu Code Status: DNR Family Communication: Patient's son on the phone 1/29.  Patient's daughter at the bedside, 03/22/2019 evening. Disposition Plan: patient is from a skilled nursing facility. Anticipated DC to skilled nursing facility when adequate improvement. Barriers to discharge treated for active infection and sepsis. Also recommending inpatient hospice if family  agrees.  Consultants:   Palliative medicine  Oncology  Procedures:   None  Antimicrobials:  Anti-infectives (From admission, onward)   Start     Dose/Rate Route Frequency Ordered Stop   03/23/19 2200  ceFEPIme (MAXIPIME) 2 g in sodium chloride 0.9 % 100 mL IVPB     2 g 200 mL/hr over 30 Minutes Intravenous Every 12 hours 03/23/19 0955     03/19/19 0800  vancomycin (VANCOREADY) IVPB 1250 mg/250 mL  Status:  Discontinued     1,250 mg 166.7 mL/hr over 90 Minutes Intravenous Every 24 hours 03/18/19 0925 03/20/19 1008   03/18/19 1400  metroNIDAZOLE (FLAGYL) IVPB 500 mg     500 mg 100 mL/hr over 60 Minutes Intravenous Every 8 hours 03/18/19 0828     03/18/19 1400  ceFEPIme (MAXIPIME) 2 g in sodium chloride 0.9 % 100 mL IVPB  Status:  Discontinued     2 g 200 mL/hr over 30 Minutes Intravenous Every 8 hours 03/18/19 0928 03/23/19 0955   03/18/19 0830  metroNIDAZOLE (FLAGYL) IVPB 500 mg  Status:  Discontinued     500 mg 100 mL/hr over 60 Minutes Intravenous Every 8 hours 03/18/19 0827 03/18/19 0828   03/18/19 0615  vancomycin (VANCOREADY) IVPB 1500 mg/300 mL     1,500 mg 150 mL/hr over 120 Minutes Intravenous  Once 03/18/19 0530 03/18/19 0958   03/18/19 0530  ceFEPIme (MAXIPIME) 2 g in sodium chloride 0.9 % 100 mL IVPB     2 g 200 mL/hr over 30 Minutes Intravenous  Once 03/18/19 0526 03/18/19 0648   03/18/19 0530  metroNIDAZOLE (FLAGYL) IVPB 500 mg     500 mg 100 mL/hr over 60 Minutes Intravenous  Once 03/18/19 0526 03/18/19 0749   03/18/19 0530  vancomycin (VANCOCIN) IVPB 1000 mg/200 mL premix  Status:  Discontinued     1,000 mg 200 mL/hr over 60 Minutes Intravenous  Once 03/18/19 0526 03/18/19 0530         Subjective: Patient seen and examined.  No overnight events.  Early morning he had large amount of oral secretions and nurses were able to extract some secretions from the mouth, thick mucoid. Patient remains persistently lethargic and hardly interactive. By afternoon, he  had started having temperature 101.5. We will recheck his chest x-ray and redraw blood cultures.  Objective: Vitals:   03/24/19 1311 03/24/19 1409 03/24/19 1410 03/24/19 1452  BP: 136/74     Pulse: 96     Resp: (!) 27   (!) 26  Temp: (!) 101.5 F (38.6 C) (!) 101 F (38.3 C) (!) 101 F (38.3 C) (!) 101.1 F (38.4 C)  TempSrc: Oral Axillary Oral Oral  SpO2: 95%     Weight:      Height:        Intake/Output Summary (Last 24 hours) at 03/24/2019 1454 Last data filed at 03/24/2019 0900 Gross per 24 hour  Intake 300 ml  Output 850 ml  Net -550 ml   Filed Weights   03/22/19 0431 03/22/19 0543 03/23/19 0415  Weight: 71.8 kg 71.8 kg 75.7 kg    Examination:  General exam: Sick looking, minimally interactive.  Severely debilitated and audible gurgling sound from oral cavity. Respiratory system: Bilateral conducted upper airway sounds.  Cardiovascular system: S1 & S2 heard, tachycardic. RRR.   Gastrointestinal system: Abdomen is nondistended, soft and nontender. No organomegaly or masses felt. Normal bowel sounds heard.  PEG tube in place infusing. Central nervous system: Mostly lethargic.  Follows simple commands.   Skin: No rashes, lesions, stage IV sacral decubitus  present on admission.   Psychiatry: Judgement and insight appear compromised.  Mood & affect anxious and flat. Mostly lethargic.    Data Reviewed: I have personally reviewed following labs and imaging studies  CBC: Recent Labs  Lab 03/20/19 0830 03/21/19 0424 03/22/19 0515 03/23/19 0404 03/24/19 0427  WBC 14.5* 16.8* 19.0* 21.9* 23.6*  NEUTROABS 7.3 6.9 8.2* 14.2* 8.1*  HGB 10.6* 10.5* 11.1* 10.7* 10.5*  HCT 33.2* 33.0* 34.3* 34.1* 33.5*  MCV 97.1 96.5 96.9 97.2 98.5  PLT 337 307 283 262 734   Basic Metabolic Panel: Recent Labs  Lab 03/18/19 0535 03/19/19 0329 03/20/19 0830 03/21/19 0424 03/22/19 1152  NA 135 135 137 136 137  K 4.7 3.4* 3.8 4.4 4.2  CL 103 107 110 108 107  CO2 25 23 21* 21* 24    GLUCOSE 114* 118* 135* 128* 138*  BUN 28* 22 26* 29* 40*  CREATININE 0.53* 0.40* 0.56* 0.76 0.76  CALCIUM 8.3* 8.0* 8.0* 8.2* 8.3*  MG  --   --  1.9  --   --   PHOS  --   --  2.4*  --   --    GFR: Estimated Creatinine Clearance: 57.6 mL/min (by C-G formula based on SCr of 0.76 mg/dL). Liver Function Tests: Recent Labs  Lab 03/18/19 0535 03/19/19 0329  AST 29 18  ALT 21 17  ALKPHOS 64 50  BILITOT 0.6 0.8  PROT 5.9* 4.7*  ALBUMIN 2.2* 1.7*   No results for input(s): LIPASE, AMYLASE in the last 168 hours. No results for input(s): AMMONIA in the last 168 hours. Coagulation Profile: Recent Labs  Lab 03/18/19 0535  INR 1.2   Cardiac Enzymes: No results for input(s): CKTOTAL, CKMB, CKMBINDEX, TROPONINI in the last 168 hours. BNP (last 3 results) No results for input(s): PROBNP in the last 8760 hours. HbA1C: No results for input(s): HGBA1C in the last 72 hours. CBG: Recent Labs  Lab 03/23/19 2029 03/24/19 0108 03/24/19 0531 03/24/19 0736 03/24/19 1136  GLUCAP 127* 146* 132* 129* 138*   Lipid Profile: No results for input(s): CHOL, HDL, LDLCALC, TRIG, CHOLHDL, LDLDIRECT in the last 72 hours. Thyroid Function Tests: No results for input(s): TSH, T4TOTAL, FREET4, T3FREE, THYROIDAB in the last 72 hours. Anemia Panel: No results for input(s): VITAMINB12, FOLATE, FERRITIN, TIBC, IRON, RETICCTPCT in the last 72 hours. Sepsis Labs: Recent Labs  Lab 03/18/19 0535 03/18/19 1012 03/19/19 0329 03/20/19 0830  PROCALCITON  --  <0.10 0.11 <0.10  LATICACIDVEN 1.5  --   --   --     Recent Results (from the past 240 hour(s))  Culture, blood (Routine x 2)     Status: None   Collection Time: 03/18/19  5:35 AM   Specimen: BLOOD RIGHT HAND  Result Value Ref Range Status   Specimen Description   Final    BLOOD RIGHT HAND Performed at [facility] [address], [place], [place] [address]    Special Requests   Final    BOTTLES DRAWN AEROBIC AND ANAEROBIC  Blood Culture results may not be optimal due to an inadequate volume of blood received in culture bottles Performed at [facility] [address] [place], [place] [address]    Culture   Final    NO GROWTH 5 DAYS Performed at [facility], [place] [address] [place], [place] [address]    Report Status 03/23/2019 FINAL  Final  Culture, blood (Routine x 2)     Status: None  Collection Time: 03/18/19  5:35 AM   Specimen: BLOOD LEFT FOREARM  Result Value Ref Range Status   Specimen Description   Final    BLOOD LEFT FOREARM Performed at [facility] [address], [place], [place] [address]    Special Requests   Final    BOTTLES DRAWN AEROBIC AND ANAEROBIC Blood Culture adequate volume Performed at [facility] [address] [place], [place] [address]    Culture   Final    NO GROWTH 5 DAYS Performed at [facility], [place] [address] [place], [place] [address]    Report Status 03/23/2019 FINAL  Final  Urine culture     Status: None   Collection Time: 03/18/19  5:35 AM   Specimen: Urine, Random  Result Value Ref Range Status   Specimen Description   Final    URINE, RANDOM Performed at [facility] [address], [place], [place] [address]    Special Requests   Final    NONE Performed at [facility], [place] [address], [place], [place] [address]    Culture   Final    NO GROWTH Performed at [facility], [place] [address], [place], [place] [address]    Report Status 03/19/2019 FINAL  Final  MRSA PCR Screening     Status: None   Collection Time: 03/18/19  9:46 AM   Specimen: Nasal Mucosa; Nasopharyngeal  Result Value Ref Range Status   MRSA by PCR NEGATIVE NEGATIVE Final    Comment:        The GeneXpert MRSA Assay (FDA approved for NASAL specimens only), is one component of a comprehensive MRSA colonization surveillance program. It is not intended to diagnose  MRSA infection nor to guide or monitor treatment for MRSA infections. Performed at [facility], [place] [address], [place], [place] [address]          Radiology Studies: No results found.      Scheduled Meds: . brinzolamide  1 drop Both Eyes TID  . chlorhexidine  15 mL Mouth Rinse BID  . Chlorhexidine Gluconate Cloth  6 each Topical Daily  . enoxaparin (LOVENOX) injection  40 mg Subcutaneous Q24H  . feeding supplement (PRO-STAT SUGAR FREE 64)  30 mL Per Tube Daily  . free water  100 mL Per Tube Q6H  . latanoprost  1 drop Both Eyes QHS  . mouth rinse  15 mL Mouth Rinse q12n4p  . sodium chloride flush  10-40 mL Intracatheter Q12H   Continuous Infusions: . sodium chloride Stopped (03/18/19 2252)  . ceFEPime (MAXIPIME) IV 2 g (03/24/19 1056)  . feeding supplement (OSMOLITE 1.5 CAL) 1,000 mL (03/23/19 2303)  . metronidazole 500 mg (03/24/19 1313)     LOS: 6 days    Time spent: 25 minutes     Barb Merino, MD [facility] Pager [phone]

## 2019-03-24 NOTE — Progress Notes (Signed)
Patient gurgling and coughing. This RN was able to suction orally and was able to  remove a large amount of thick yellow mucous. Patient was able to cough for me and answered that he felt better once removed. Will continue to monitor patient closely.

## 2019-03-25 ENCOUNTER — Inpatient Hospital Stay (HOSPITAL_COMMUNITY): Payer: Medicare Other

## 2019-03-25 LAB — GLUCOSE, CAPILLARY
Glucose-Capillary: 114 mg/dL — ABNORMAL HIGH (ref 70–99)
Glucose-Capillary: 115 mg/dL — ABNORMAL HIGH (ref 70–99)
Glucose-Capillary: 119 mg/dL — ABNORMAL HIGH (ref 70–99)
Glucose-Capillary: 123 mg/dL — ABNORMAL HIGH (ref 70–99)
Glucose-Capillary: 124 mg/dL — ABNORMAL HIGH (ref 70–99)
Glucose-Capillary: 132 mg/dL — ABNORMAL HIGH (ref 70–99)

## 2019-03-25 LAB — CBC WITH DIFFERENTIAL/PLATELET
Abs Immature Granulocytes: 0.28 10*3/uL — ABNORMAL HIGH (ref 0.00–0.07)
Abs Immature Granulocytes: 0.35 10*3/uL — ABNORMAL HIGH (ref 0.00–0.07)
Basophils Absolute: 0.2 10*3/uL — ABNORMAL HIGH (ref 0.0–0.1)
Basophils Absolute: 0.2 10*3/uL — ABNORMAL HIGH (ref 0.0–0.1)
Basophils Relative: 1 %
Basophils Relative: 1 %
Eosinophils Absolute: 0.3 10*3/uL (ref 0.0–0.5)
Eosinophils Absolute: 0.3 10*3/uL (ref 0.0–0.5)
Eosinophils Relative: 1 %
Eosinophils Relative: 1 %
HCT: 36 % — ABNORMAL LOW (ref 39.0–52.0)
HCT: 34.5 % — ABNORMAL LOW (ref 39.0–52.0)
Hemoglobin: 11 g/dL — ABNORMAL LOW (ref 13.0–17.0)
Hemoglobin: 10.7 g/dL — ABNORMAL LOW (ref 13.0–17.0)
Immature Granulocytes: 1 %
Immature Granulocytes: 1 %
Lymphocytes Relative: 21 %
Lymphocytes Relative: 20 %
Lymphs Abs: 5.6 10*3/uL — ABNORMAL HIGH (ref 0.7–4.0)
Lymphs Abs: 5.7 10*3/uL — ABNORMAL HIGH (ref 0.7–4.0)
MCH: 30.1 pg (ref 26.0–34.0)
MCH: 30.7 pg (ref 26.0–34.0)
MCHC: 30.6 g/dL (ref 30.0–36.0)
MCHC: 31 g/dL (ref 30.0–36.0)
MCV: 98.4 fL (ref 80.0–100.0)
MCV: 98.9 fL (ref 80.0–100.0)
Monocytes Absolute: 11.5 10*3/uL — ABNORMAL HIGH (ref 0.1–1.0)
Monocytes Absolute: 11.9 10*3/uL — ABNORMAL HIGH (ref 0.1–1.0)
Monocytes Relative: 43 %
Monocytes Relative: 40 %
Neutro Abs: 8.8 10*3/uL — ABNORMAL HIGH (ref 1.7–7.7)
Neutro Abs: 10.8 10*3/uL — ABNORMAL HIGH (ref 1.7–7.7)
Neutrophils Relative %: 33 %
Neutrophils Relative %: 37 %
Platelets: 308 10*3/uL (ref 150–400)
Platelets: 310 10*3/uL (ref 150–400)
RBC: 3.66 MIL/uL — ABNORMAL LOW (ref 4.22–5.81)
RBC: 3.49 MIL/uL — ABNORMAL LOW (ref 4.22–5.81)
RDW: 16.2 % — ABNORMAL HIGH (ref 11.5–15.5)
RDW: 16.1 % — ABNORMAL HIGH (ref 11.5–15.5)
WBC: 26.7 10*3/uL — ABNORMAL HIGH (ref 4.0–10.5)
WBC: 29.1 10*3/uL — ABNORMAL HIGH (ref 4.0–10.5)
nRBC: 0.1 % (ref 0.0–0.2)
nRBC: 0.3 % — ABNORMAL HIGH (ref 0.0–0.2)

## 2019-03-25 LAB — COMPREHENSIVE METABOLIC PANEL
ALT: 26 U/L (ref 0–44)
AST: 35 U/L (ref 15–41)
Albumin: 1.9 g/dL — ABNORMAL LOW (ref 3.5–5.0)
Alkaline Phosphatase: 63 U/L (ref 38–126)
Anion gap: 9 (ref 5–15)
BUN: 38 mg/dL — ABNORMAL HIGH (ref 8–23)
CO2: 23 mmol/L (ref 22–32)
Calcium: 7.9 mg/dL — ABNORMAL LOW (ref 8.9–10.3)
Chloride: 108 mmol/L (ref 98–111)
Creatinine, Ser: 0.5 mg/dL — ABNORMAL LOW (ref 0.61–1.24)
GFR calc Af Amer: >60 mL/min (ref >60)
GFR calc non Af Amer: >60 mL/min (ref >60)
Glucose, Bld: 138 mg/dL — ABNORMAL HIGH (ref 70–99)
Potassium: 4.4 mmol/L (ref 3.5–5.1)
Sodium: 140 mmol/L (ref 135–145)
Total Bilirubin: 0.7 mg/dL (ref 0.3–1.2)
Total Protein: 5.2 g/dL — ABNORMAL LOW (ref 6.5–8.1)

## 2019-03-25 LAB — BRAIN NATRIURETIC PEPTIDE: B Natriuretic Peptide: 45.5 pg/mL (ref 0.0–100.0)

## 2019-03-25 LAB — C-REACTIVE PROTEIN: CRP: 1.3 mg/dL — ABNORMAL HIGH (ref <1.0)

## 2019-03-25 LAB — SEDIMENTATION RATE: Sed Rate: 60 mm/hr — ABNORMAL HIGH (ref 0–16)

## 2019-03-25 LAB — PROCALCITONIN: Procalcitonin: 0.1 ng/mL

## 2019-03-25 LAB — LACTIC ACID, PLASMA: Lactic Acid, Venous: 1.5 mmol/L (ref 0.5–1.9)

## 2019-03-25 LAB — MAGNESIUM: Magnesium: 2.6 mg/dL — ABNORMAL HIGH (ref 1.7–2.4)

## 2019-03-25 LAB — PHOSPHORUS: Phosphorus: 3.3 mg/dL (ref 2.5–4.6)

## 2019-03-25 MED ORDER — VANCOMYCIN HCL 1500 MG/300ML IV SOLN
1500.0000 mg | Freq: Once | INTRAVENOUS | Status: AC
  Administered 2019-03-25: 17:00:00 1500 mg via INTRAVENOUS
  Filled 2019-03-25: qty 300

## 2019-03-25 MED ORDER — VANCOMYCIN HCL 1250 MG/250ML IV SOLN
1250.0000 mg | INTRAVENOUS | Status: DC
  Administered 2019-03-26: 11:00:00 1250 mg via INTRAVENOUS
  Filled 2019-03-25: qty 250

## 2019-03-25 MED ORDER — GUAIFENESIN 100 MG/5ML PO SOLN: 10.0000 mL | Freq: Two times a day (BID) | ORAL | Status: DC

## 2019-03-25 MED ORDER — SODIUM CHLORIDE (PF) 0.9 % IJ SOLN
INTRAMUSCULAR | Status: AC
  Filled 2019-03-25: qty 50

## 2019-03-25 MED ORDER — GUAIFENESIN 100 MG/5ML PO SOLN
10.0000 mL | Freq: Two times a day (BID) | ORAL | Status: DC
  Administered 2019-03-25 – 2019-03-26 (×3): 200 mg
  Filled 2019-03-25 (×3): qty 10

## 2019-03-25 MED ORDER — GUAIFENESIN ER 600 MG PO TB12: 1200.0000 mg | ORAL_TABLET | Freq: Two times a day (BID) | ORAL | Status: DC

## 2019-03-25 MED ORDER — IOHEXOL 350 MG/ML SOLN
100.0000 mL | Freq: Once | INTRAVENOUS | Status: AC | PRN
  Administered 2019-03-25: 13:00:00 100 mL via INTRAVENOUS

## 2019-03-25 MED ORDER — IBUPROFEN 100 MG/5ML PO SUSP
200.0000 mg | Freq: Four times a day (QID) | ORAL | Status: DC | PRN
  Administered 2019-03-25: 13:00:00 200 mg
  Filled 2019-03-25 (×2): qty 10

## 2019-03-25 NOTE — Progress Notes (Signed)
Pharmacy Antibiotic Note  [name] is a 86 y.o. male admitted on 03/18/2019 with sepsis d/t recurrent aspiration.  Pharmacy has been consulted for vancomycin. Known to pharmacy from recent admission s/p 5 days remdesivir for Covid PNA and s/p 14 days IV PCN for neurosyphilis. Continues on cefepime/Flagyl per MD. Had been on vanc earlier this admission for same, now resuming with recurrent aspiration and clinical worsening. ID consulted. GOC still undecided.  Plan:  Resume vancomycin 1500 mg IV now, then 1250 mg IV q24 hr (est AUC 464 based on SCr 0.8; Vd 0.72)  Measure vancomycin AUC at steady state as indicated  SCr q48 while on vanc  Continue Cefepime 2g IV q12 hr and Flagyl 500 mg IV q8 hr per MD as ordered; dose appropriate   Temp (24hrs), Avg:100.5 F (38.1 C), Min:99.6 F (37.6 C), Max:101.5 F (38.6 C)  Recent Labs  Lab 03/19/19 0329 03/19/19 0329 03/20/19 0830 03/20/19 0830 03/21/19 0424 03/21/19 0424 03/22/19 0515 03/22/19 1152 03/23/19 0404 03/24/19 0427 03/25/19 0358 03/25/19 0551  WBC 14.6*   < > 14.5*   < > 16.8*   < > 19.0*  --  21.9* 23.6* 26.7* 29.1*  CREATININE 0.40*  --  0.56*  --  0.76  --   --  0.76  --   --   --  0.50*  LATICACIDVEN  --   --   --   --   --   --   --   --   --   --   --  1.5   < > = values in this interval not displayed.    Estimated Creatinine Clearance: 57.6 mL/min (A) (by C-G formula based on SCr of 0.5 mg/dL (L)).    No Known Allergies   Antimicrobials this admission: 1/24 cefepime >>  1/24 vancomycin >> 1/26; resume 1/31 >>  1/24 flagyl>>  Dose adjustments this admission:  Microbiology results: 1/24 BCx: NGF 1/24 MRSA PCR: neg 1/24 UCx: ngf 1/18 Covid PCR: neg Thank you for allowing pharmacy to be a part of this patient's care  Reuel Boom, PharmD, BCPS [phone] 03/25/2019, 3:40 PM

## 2019-03-25 NOTE — Progress Notes (Signed)
Patients MEWS red due to HR, LOC, and RR. Dr. [place] Crane made aware. This is not an acute change for this patient.

## 2019-03-25 NOTE — Progress Notes (Signed)
Routine right PICC line dressing change done. Site pink, no drainage. Patient pulled arm away during dressing change and PICC is now out 5 cm, Informed RN to obtain CXR for PICC tip verification. PICC capped, flushed easily with 10 ml normal saline, PICC has good blood return.

## 2019-03-25 NOTE — Progress Notes (Signed)
NTS pt for moderate amount of thick white sec.  Pt tolerated well.

## 2019-03-25 NOTE — Progress Notes (Signed)
PROGRESS NOTE  [name] PFY:[id number] DOB: [birth year] DOA: 03/18/2019 PCP: Brett Bush, MD  HPI/Recap of past 24 hours: 86 year old male with dementia, hypertension, hyperlipidemia, GERD and BPH from skilled nursing facility presented with altered mental status and difficulty breathing.  Recent extensive hospitalizations, 02/22/2019-03/14/2019 for COVID-19 pneumonia, neurosyphilis, extensive treatment with remdesivir, dexamethasone, IV penicillin for 2 weeks, also diagnosed with B-cell non-Hodgkin's lymphoma.  PEG tube placement and discharged to nursing home on 03/16/19.  Presented back on 1/24 with above.  In the emergency room temperature 101, tachycardic, respiratory rate 27.  Blood pressure was low normal.  Oxygen 96% on room air.  WBC 13.7.  Urinalysis unremarkable.  Chest x-ray with residual opacities consistent with recent COVID-19 pneumonia superimposed by suspected aspiration pneumonia.  More lethargic (1/27) with audible rhonchorous sounds and poor clearance of secretions. No meaningful improvement.  Recurrent fevers.  T-max 101.5 on 03/25/2019.  03/25/19: Seen and examined.  Audible rhonchorous sounds heard across the room.  Lethargic.  Febrile with T-max 101.5.  Worsening leukocytosis up to 26.7K with moderate left shift.  ID paged for consult, Dr. Baxter Flattery.   Assessment/Plan: Principal Problem:   Severe sepsis (HCC) Active Problems:   Dyslipidemia   Glaucoma   Essential hypertension   Advanced dementia ([place])   Diffuse large cell non-Hodgkin's lymphoma ([place])   Stage IV pressure ulcer of sacral region [facility])   Dysphagia   Aspiration pneumonia of both lower lobes due to gastric secretions (HCC)  Sepsis suspected secondary to recurrent aspiration with presumed aspiration pneumonia, present on admission: Recurrent fevers with T-max of 101.5 Sputum culture ordered and pending. Worsening leukocytosis with WBC 26.7K from 23K Audible ronchorous sounds noted with high  suspicion for silent aspiration Nasotracheal suctioning by RT 3 times daily; added Mucinex solution. Procalcitonin level, lactic acid ordered and pending. Independently reviewed chest x-ray done on 03/25/2019 which showed left lower lobe infiltrates versus atelectasis.  Obtain BNP. Blood cultures done on 03/24/2019 in process.  Continue to follow cultures.  Currently on broad-spectrum IV antibiotics IV cefepime and IV Flagyl.  Continue.  Aspiration precautions in place on PEG tube feeding. Goals of care discussed with his son [name] this morning, undecided.  Palliative care team also following.  Acute metabolic encephalopathy likely multifactorial in the setting of dementia, acute illness, recent neurosyphilis and recent COVID-19 viral infection versus others  Lethargic, no interaction. Prognosis is poor.  Stage IV sacral decubitus ulcer: Present on admission.  Continue Local wound care.    Obtain ESR and CRP.  If elevated consider sacral MRI to rule out osteomyelitis.  Diffuse large cell non-Hodgkin's lymphoma: Followed by oncology.  Less likely a candidate for any treatment now given severe debility. With patient's current condition, he is less likely to make any meaningful recovery.  Advanced dementia/failure to thrive/dysphagia: Recently received PEG tube. Keep NPO. Administer medicine through PEG tube. Tube feeding at goal. DNR. Aspiration precautions.  Maintain head of bed elevated >35 degrees.  History of COVID-19 pneumonia: Completed treatment.  Outside of isolation window.  Hypertension: Blood pressures low on presentation.  Holding all antihypertensives for now.  Resolved post repletion: Hypokalemia: Replaced.  Potassium 4.2 on 03/22/2019.  Repeated potassium in process.  Advance care planning:  Patient's wife had assigned patient's son [name] as contact person.  Updated Mr. [name] on 03/25/2019.  No further decision has been made regarding goals of care at the time of  this dictation. Palliative care team following. Patient has a very poor prognosis.  DVT prophylaxis: Lovenox subcu daily Code Status: DNR Family Communication: Patient's son [name] on the phone 03/25/19.  Patient's daughter at the bedside, 03/22/2019 evening. Disposition Plan: patient is from a skilled nursing facility. Anticipated DC to skilled nursing facility when adequate improvement. Barriers to discharge ongoing treatment for sepsis without significant improvement. Recommending inpatient hospice if family agrees.  Consultants:   Palliative medicine  Oncology  ID on 03/25/19, Dr. Baxter Flattery paged for consult.  Procedures:   None      Objective: Vitals:   03/25/19 0507 03/25/19 0511 03/25/19 0515 03/25/19 0950  BP: 137/77     Pulse: (!) 101     Resp: (!) 24     Temp: 100 F (37.8 C)   (!) 101.5 F (38.6 C)  TempSrc: Oral   Rectal  SpO2: 94%     Weight:  76.8 kg 76.2 kg   Height:        Intake/Output Summary (Last 24 hours) at 03/25/2019 1057 Last data filed at 03/25/2019 0514 Gross per 24 hour  Intake 7405.58 ml  Output 600 ml  Net 6805.58 ml   Filed Weights   03/23/19 0415 03/25/19 0511 03/25/19 0515  Weight: 75.7 kg 76.8 kg 76.2 kg    Exam:  . General: 86 y.o. year-old male frail-appearing.  Lethargic.   . Cardiovascular: Regular rate and rhythm with no rubs or gallops.  Marland Kitchen Respiratory: Audible rhonchorous sounds with poor inspiratory effort.  . Abdomen: Soft normal bowel sounds x4 quadrants.  PEG tube in place. . Musculoskeletal: Trace lower extremity edema.  Marland Kitchen Psychiatry: Unable to assess mood due to lethargy.  Data Reviewed: CBC: Recent Labs  Lab 03/21/19 0424 03/22/19 0515 03/23/19 0404 03/24/19 0427 03/25/19 0358  WBC 16.8* 19.0* 21.9* 23.6* 26.7*  NEUTROABS 6.9 8.2* 14.2* 8.1* 8.8*  HGB 10.5* 11.1* 10.7* 10.5* 11.0*  HCT 33.0* 34.3* 34.1* 33.5* 36.0*  MCV 96.5 96.9 97.2 98.5 98.4  PLT 307 283 262 259 734   Basic Metabolic  Panel: Recent Labs  Lab 03/19/19 0329 03/20/19 0830 03/21/19 0424 03/22/19 1152  NA 135 137 136 137  K 3.4* 3.8 4.4 4.2  CL 107 110 108 107  CO2 23 21* 21* 24  GLUCOSE 118* 135* 128* 138*  BUN 22 26* 29* 40*  CREATININE 0.40* 0.56* 0.76 0.76  CALCIUM 8.0* 8.0* 8.2* 8.3*  MG  --  1.9  --   --   PHOS  --  2.4*  --   --    GFR: Estimated Creatinine Clearance: 57.6 mL/min (by C-G formula based on SCr of 0.76 mg/dL). Liver Function Tests: Recent Labs  Lab 03/19/19 0329  AST 18  ALT 17  ALKPHOS 50  BILITOT 0.8  PROT 4.7*  ALBUMIN 1.7*   No results for input(s): LIPASE, AMYLASE in the last 168 hours. No results for input(s): AMMONIA in the last 168 hours. Coagulation Profile: No results for input(s): INR, PROTIME in the last 168 hours. Cardiac Enzymes: No results for input(s): CKTOTAL, CKMB, CKMBINDEX, TROPONINI in the last 168 hours. BNP (last 3 results) No results for input(s): PROBNP in the last 8760 hours. HbA1C: No results for input(s): HGBA1C in the last 72 hours. CBG: Recent Labs  Lab 03/24/19 1633 03/24/19 2012 03/25/19 0047 03/25/19 0459 03/25/19 0732  GLUCAP 122* 127* 132* 119* 124*   Lipid Profile: No results for input(s): CHOL, HDL, LDLCALC, TRIG, CHOLHDL, LDLDIRECT in the last 72 hours. Thyroid Function Tests: No results for input(s): TSH, T4TOTAL, FREET4, T3FREE, THYROIDAB  in the last 72 hours. Anemia Panel: No results for input(s): VITAMINB12, FOLATE, FERRITIN, TIBC, IRON, RETICCTPCT in the last 72 hours. Urine analysis:    Component Value Date/Time   COLORURINE YELLOW 03/18/2019 [place] 03/18/2019 0535   LABSPEC 1.016 03/18/2019 0535   PHURINE 7.0 03/18/2019 0535   GLUCOSEU NEGATIVE 03/18/2019 0535   HGBUR NEGATIVE 03/18/2019 0535   BILIRUBINUR NEGATIVE 03/18/2019 0535   BILIRUBINUR negative 02/05/2019 1610   KETONESUR NEGATIVE 03/18/2019 0535   PROTEINUR NEGATIVE 03/18/2019 0535   UROBILINOGEN 0.2 02/05/2019 1610    UROBILINOGEN 0.2 03/21/2007 0944   NITRITE NEGATIVE 03/18/2019 0535   LEUKOCYTESUR NEGATIVE 03/18/2019 0535   Sepsis Labs: _0 (procalcitonin:4,lacticidven:4)  ) Recent Results (from the past 240 hour(s))  Culture, blood (Routine x 2)     Status: None   Collection Time: 03/18/19  5:35 AM   Specimen: BLOOD RIGHT HAND  Result Value Ref Range Status   Specimen Description   Final    BLOOD RIGHT HAND Performed at [facility], [place] [address], [place], [place] [address]    Special Requests   Final    BOTTLES DRAWN AEROBIC AND ANAEROBIC Blood Culture results may not be optimal due to an inadequate volume of blood received in culture bottles Performed at [place] [address] [place], [place] [address]    Culture   Final    NO GROWTH 5 DAYS Performed at [facility], [place] [address], [place], [place] [address]    Report Status 03/23/2019 FINAL  Final  Culture, blood (Routine x 2)     Status: None   Collection Time: 03/18/19  5:35 AM   Specimen: BLOOD LEFT FOREARM  Result Value Ref Range Status   Specimen Description   Final    BLOOD LEFT FOREARM Performed at [place] [address] [place], [place] [address]    Special Requests   Final    BOTTLES DRAWN AEROBIC AND ANAEROBIC Blood Culture adequate volume Performed at [place] [address], [place], [place] [address]    Culture   Final    NO GROWTH 5 DAYS Performed at [facility], [place] [address], [place], [place] [address]    Report Status 03/23/2019 FINAL  Final  Urine culture     Status: None   Collection Time: 03/18/19  5:35 AM   Specimen: Urine, Random  Result Value Ref Range Status   Specimen Description   Final    URINE, RANDOM Performed at [place] [address], [place], [place] [address]    Special Requests   Final    NONE Performed at [facility], [place]  [address] [place], [place] [address]    Culture   Final    NO GROWTH Performed at [facility], [place] [address], [place], [place] [address]    Report Status 03/19/2019 FINAL  Final  MRSA PCR Screening     Status: None   Collection Time: 03/18/19  9:46 AM   Specimen: Nasal Mucosa; Nasopharyngeal  Result Value Ref Range Status   MRSA by PCR NEGATIVE NEGATIVE Final    Comment:        The GeneXpert MRSA Assay (FDA approved for NASAL specimens only), is one component of a comprehensive MRSA colonization surveillance program. It is not intended to diagnose MRSA infection nor to guide or monitor treatment for MRSA infections. Performed at [facility], [place] [address], [place], [place] [address]       Studies: DG CHEST PORT 1 VIEW  Result Date: 03/25/2019 CLINICAL DATA:  Hypoxia. EXAM: PORTABLE CHEST 1 VIEW COMPARISON:  03/24/2019 FINDINGS: Right-sided PICC line unchanged. Lungs are hypoinflated with persistent left base opacification which may be due to small effusion/atelectasis versus infection. Slight prominence of the right infrahilar markings. Cardiomediastinal silhouette and remainder of the exam is unchanged. IMPRESSION: Stable left base opacification likely small effusion with atelectasis although infection is possible. Right-sided PICC line unchanged. Electronically Signed   By: Marin Olp M.D.   On: 03/25/2019 09:51   DG CHEST PORT 1 VIEW  Result Date: 03/24/2019 CLINICAL DATA:  Evaluate pneumonia EXAM: PORTABLE CHEST 1 VIEW COMPARISON:  03/21/2019 FINDINGS: Stable cardiomediastinal contours. No appreciable residual airspace opacity in the right upper lobe. Improved aeration of the right lung base. Mild residual streaky opacity within the left lung base. Right-sided PICC line remains in place. No pneumothorax. IMPRESSION: Improved aeration of the right upper lobe and right lung base. Mild residual streaky opacity within the left lung base. Electronically  Signed   By: Davina Poke D.O.   On: 03/24/2019 15:34    Scheduled Meds: . brinzolamide  1 drop Both Eyes TID  . chlorhexidine  15 mL Mouth Rinse BID  . Chlorhexidine Gluconate Cloth  6 each Topical Daily  . enoxaparin (LOVENOX) injection  40 mg Subcutaneous Q24H  . feeding supplement (PRO-STAT SUGAR FREE 64)  30 mL Per Tube Daily  . free water  100 mL Per Tube Q6H  . guaiFENesin  10 mL Per Tube BID  . latanoprost  1 drop Both Eyes QHS  . mouth rinse  15 mL Mouth Rinse q12n4p  . sodium chloride flush  10-40 mL Intracatheter Q12H    Continuous Infusions: . sodium chloride Stopped (03/18/19 2252)  . ceFEPime (MAXIPIME) IV 2 g (03/25/19 0935)  . feeding supplement (OSMOLITE 1.5 CAL) 1,000 mL (03/24/19 1557)  . metronidazole 500 mg (03/25/19 0520)     LOS: 7 days     Kayleen Memos, MD [facility] Pager [phone]  If 7PM-7AM, please contact night-coverage [email or web address] Password [facility] 03/25/2019, 10:57 AM

## 2019-03-25 NOTE — Progress Notes (Signed)
Updated the patient's son [name].  They are still in a process of making a final decision regarding goals of care.  His sister has seen Mr. Sassone in the hospital.  Offered to call her as well and give updates.  Debbie-[phone].  Called- Rings busy- no answer.  Will call again.

## 2019-03-26 DIAGNOSIS — R6251 Failure to thrive (child): Secondary | ICD-10-CM | POA: Diagnosis present

## 2019-03-26 DIAGNOSIS — A419 Sepsis, unspecified organism: Principal | ICD-10-CM

## 2019-03-26 DIAGNOSIS — Z66 Do not resuscitate: Secondary | ICD-10-CM | POA: Clinically undetermined

## 2019-03-26 DIAGNOSIS — R6520 Severe sepsis without septic shock: Secondary | ICD-10-CM

## 2019-03-26 DIAGNOSIS — G9341 Metabolic encephalopathy: Secondary | ICD-10-CM

## 2019-03-26 LAB — GLUCOSE, CAPILLARY
Glucose-Capillary: 108 mg/dL — ABNORMAL HIGH (ref 70–99)
Glucose-Capillary: 109 mg/dL — ABNORMAL HIGH (ref 70–99)
Glucose-Capillary: 118 mg/dL — ABNORMAL HIGH (ref 70–99)
Glucose-Capillary: 119 mg/dL — ABNORMAL HIGH (ref 70–99)
Glucose-Capillary: 119 mg/dL — ABNORMAL HIGH (ref 70–99)

## 2019-03-26 LAB — CBC WITH DIFFERENTIAL/PLATELET
Abs Immature Granulocytes: 0 10*3/uL (ref 0.00–0.07)
Basophils Absolute: 0 10*3/uL (ref 0.0–0.1)
Basophils Relative: 0 %
Blasts: 13 %
Eosinophils Absolute: 0.6 10*3/uL — ABNORMAL HIGH (ref 0.0–0.5)
Eosinophils Relative: 2 %
HCT: 35.7 % — ABNORMAL LOW (ref 39.0–52.0)
Hemoglobin: 11 g/dL — ABNORMAL LOW (ref 13.0–17.0)
Lymphocytes Relative: 15 %
Lymphs Abs: 4.1 10*3/uL — ABNORMAL HIGH (ref 0.7–4.0)
MCH: 30.6 pg (ref 26.0–34.0)
MCHC: 30.8 g/dL (ref 30.0–36.0)
MCV: 99.4 fL (ref 80.0–100.0)
Monocytes Absolute: 1.1 10*3/uL — ABNORMAL HIGH (ref 0.1–1.0)
Monocytes Relative: 4 %
Neutro Abs: 18.2 10*3/uL — ABNORMAL HIGH (ref 1.7–7.7)
Neutrophils Relative %: 66 %
Platelets: 324 10*3/uL (ref 150–400)
RBC: 3.59 MIL/uL — ABNORMAL LOW (ref 4.22–5.81)
RDW: 16.6 % — ABNORMAL HIGH (ref 11.5–15.5)
WBC: 27.5 10*3/uL — ABNORMAL HIGH (ref 4.0–10.5)
nRBC: 0.1 % (ref 0.0–0.2)

## 2019-03-26 LAB — BASIC METABOLIC PANEL
Anion gap: 4 — ABNORMAL LOW (ref 5–15)
BUN: 36 mg/dL — ABNORMAL HIGH (ref 8–23)
CO2: 27 mmol/L (ref 22–32)
Calcium: 8 mg/dL — ABNORMAL LOW (ref 8.9–10.3)
Chloride: 108 mmol/L (ref 98–111)
Creatinine, Ser: 0.55 mg/dL — ABNORMAL LOW (ref 0.61–1.24)
GFR calc Af Amer: >60 mL/min (ref >60)
GFR calc non Af Amer: >60 mL/min (ref >60)
Glucose, Bld: 135 mg/dL — ABNORMAL HIGH (ref 70–99)
Potassium: 4.2 mmol/L (ref 3.5–5.1)
Sodium: 139 mmol/L (ref 135–145)

## 2019-03-26 LAB — SURGICAL PATHOLOGY

## 2019-03-26 MED ORDER — FREE WATER
170.0000 mL | Status: DC
  Administered 2019-03-26 (×2): 170 mL

## 2019-03-26 MED ORDER — SCOPOLAMINE 1 MG/3DAYS TD PT72
1.0000 | MEDICATED_PATCH | TRANSDERMAL | Status: DC
  Administered 2019-03-26: 1.5 mg via TRANSDERMAL
  Filled 2019-03-26: qty 1

## 2019-03-26 MED ORDER — HALOPERIDOL LACTATE 5 MG/ML IJ SOLN: 1.0000 mg | INTRAMUSCULAR | Status: DC | PRN

## 2019-03-26 MED ORDER — FREE WATER: 100.0000 mL | Freq: Three times a day (TID) | Status: DC

## 2019-03-26 MED ORDER — GLYCOPYRROLATE 0.2 MG/ML IJ SOLN
0.2000 mg | INTRAMUSCULAR | Status: DC | PRN
  Filled 2019-03-26: qty 1

## 2019-03-26 MED ORDER — MORPHINE SULFATE (PF) 2 MG/ML IV SOLN: 2.0000 mg | INTRAVENOUS | Status: DC | PRN

## 2019-03-26 MED ORDER — PRO-STAT SUGAR FREE PO LIQD: 10.0000 mL | Freq: Every day | ORAL | Status: DC

## 2019-03-26 NOTE — Progress Notes (Signed)
PROGRESS NOTE  [name] BLT:[id number] DOB: [birth year] DOA: 03/18/2019 PCP: Brett Bush, MD  HPI/Recap of past 72 hours: 86 year old male with dementia, hypertension, hyperlipidemia, GERD and BPH from skilled nursing facility presented with altered mental status and difficulty breathing.  Recent extensive hospitalizations, 02/22/2019-03/14/2019 for COVID-19 pneumonia, neurosyphilis, extensive treatment with remdesivir, dexamethasone, IV penicillin for 2 weeks, also diagnosed with B-cell non-Hodgkin's lymphoma.  PEG tube placement and discharged to nursing home on 03/16/19.  Presented back on 1/24 with above.  In the emergency room temperature 101, tachycardic, respiratory rate 27.  Blood pressure was low normal.  Oxygen 96% on room air.  WBC 13.7.  Urinalysis unremarkable.  Chest x-ray with residual opacities consistent with recent COVID-19 pneumonia superimposed by suspected aspiration pneumonia.  More lethargic (1/27) with audible rhonchorous sounds and poor clearance of secretions. No meaningful improvement.  Recurrent fevers.  T-max 101.5 on 03/25/2019.  03/26/19: Seen and examined.  Lethargic and minimally interactive.  Fever and leukocytosis persistent suspect related to lymphoma.  Blasts on peripheral smear.  Discussed with Dr. Jana Reeves.     Assessment/Plan: Principal Problem:   Severe sepsis (HCC) Active Problems:   Dyslipidemia   Glaucoma   Essential hypertension   Advanced dementia ([name])   Diffuse large cell non-Hodgkin's lymphoma ([name])   Stage IV pressure ulcer of sacral region [facility])   Dysphagia   Aspiration pneumonia of both lower lobes due to gastric secretions (HCC)   DNR (do not resuscitate)   Failure to thrive (0-07)   Acute metabolic encephalopathy  Sepsis suspected secondary to recurrent aspiration with presumed aspiration pneumonia, present on admission: Recurrent fevers with T-max of 101.5 (03/25/19) Sputum culture ordered and pending. Leukocytosis is  slightly coming down. Audible rhonchorous sounds are improving with RT suctioning Nasotracheal suctioning by RT 3 times daily; added Mucinex solution. Procalcitonin level, lactic acid negative BNP normal Blood cultures done on 03/24/2019 negative to date.  Continue to follow cultures. Currently on broad-spectrum IV antibiotics IV cefepime and IV Flagyl.   Aspiration precautions in place on PEG tube feeding. Goals of care discussed with his son [name] 03/25/19, undecided.  Palliative care team also following.              Fever and leukocytosis persistent suspect related to lymphoma.  Blasts on              peripheral smear.  Discussed with Dr. Jana Reeves.               Blasts on peripheral smear in the setting of lymphoma              ATYPICAL IMMATURE LYMPHOCYTES WITH BLASTS. FLOW                            CYTOMETRY RECOMMENDED.              Discussed with Dr. Jana Reeves Will confirm blasts.  If confirmed this would mean patient has a very poor prognosis < 2 weeks.  Diffuse large cell non-Hodgkin's lymphoma: Followed by oncology.  Less likely a candidate for any treatment now given severe debility. With patient's current condition, he is less likely to make any meaningful recovery.  Acute metabolic encephalopathy likely multifactorial in the setting of dementia, acute illness, recent neurosyphilis and recent COVID-19 viral infection versus others  Lethargic, no interaction. Prognosis is poor.  Unstageable sacral decubitus ulcer: Present on admission.  Continue Local wound care.   ESR 60  and CRP 1.6. Wound care specialist following.  Hx of neurosyphillis syphillitic appearing lesion noted under scrotum Per medical records was treated for neurosyphillis  Advanced dementia/failure to thrive/dysphagia: Recently received PEG tube. Keep NPO. Administer medicine through PEG tube. Tube feeding at goal. DNR. Aspiration precautions.  Maintain head of bed elevated >35 degrees.  History of COVID-19  pneumonia: Completed treatment.  Outside of isolation window.  Hypertension: Blood pressures low on presentation.  Holding all antihypertensives for now.  Resolved post repletion: Hypokalemia: Replaced.  Potassium 4.2 on 03/22/2019.  Repeated potassium in process.  Advance care planning:  Patient's wife had assigned patient's son Mr. [name] as contact person.  Updated Mr. [name] on 03/25/2019.  No further decision has been made regarding goals of care at the time of this dictation. Palliative care team following. Patient has a very poor prognosis.     DVT prophylaxis: Lovenox subcu daily Code Status: DNR Family Communication: Patient's son [name] on the phone 03/25/19. Disposition Plan: patient is from a skilled nursing facility. Anticipated DC to [facility] place with hospice care versus skilled nursing facility if adequate improvement. Barriers to discharge ongoing treatment for sepsis without significant improvement. Recommending inpatient hospice if family agrees.  Consultants:   Palliative medicine  Oncology  ID on 03/25/19, Dr. Baxter Reeves.  Discussed with Dr. Jana Reeves 03/26/19.  Procedures:   None      Objective: Vitals:   03/25/19 1957 03/26/19 0410 03/26/19 0500 03/26/19 0553  BP: (!) 112/59 125/63    Pulse: 92 92  (!) 108  Resp: (!) 24 (!) 21    Temp: 99.8 F (37.7 C) (!) 100.8 F (38.2 C)  99.3 F (37.4 C)  TempSrc: Axillary Oral  Axillary  SpO2: 98% 97%    Weight:   75.6 kg   Height:        Intake/Output Summary (Last 24 hours) at 03/26/2019 1219 Last data filed at 03/26/2019 0505 Gross per 24 hour  Intake 435.75 ml  Output 825 ml  Net -389.25 ml   Filed Weights   03/25/19 0511 03/25/19 0515 03/26/19 0500  Weight: 76.8 kg 76.2 kg 75.6 kg    Exam:  . General: 86 y.o. year-old male frail-appearing.  Lethargic. . Cardiovascular: Regular rate and rhythm no rubs or gallops.   Marland Kitchen Respiratory: Diffuse rales bilaterally.  Poor inspiratory effort. . Abdomen:  Soft bowel sounds present.  PEG tube in place.   . Musculoskeletal: Trace lower extremity edema bilaterally.   . Skin: Syphillitic appearing lesion underneath scrotum.  Unstageable Sacral decubitus pressure wound. Marland Kitchen Psychiatry: Unable to assess mood due to lethargy.  Data Reviewed: CBC: Recent Labs  Lab 03/23/19 0404 03/24/19 0427 03/25/19 0358 03/25/19 0551 03/26/19 0623  WBC 21.9* 23.6* 26.7* 29.1* 27.5*  NEUTROABS 14.2* 8.1* 8.8* 10.8* 18.2*  HGB 10.7* 10.5* 11.0* 10.7* 11.0*  HCT 34.1* 33.5* 36.0* 34.5* 35.7*  MCV 97.2 98.5 98.4 98.9 99.4  PLT 262 259 308 310 096   Basic Metabolic Panel: Recent Labs  Lab 03/20/19 0830 03/21/19 0424 03/22/19 1152 03/25/19 0551 03/26/19 0623  NA 137 136 137 140 139  K 3.8 4.4 4.2 4.4 4.2  CL 110 108 107 108 108  CO2 21* 21* _0 GLUCOSE 135* 128* 138* 138* 135*  BUN 26* 29* 40* 38* 36*  CREATININE 0.56* 0.76 0.76 0.50* 0.55*  CALCIUM 8.0* 8.2* 8.3* 7.9* 8.0*  MG 1.9  --   --  2.6*  --   PHOS 2.4*  --   --  3.3  --    GFR: Estimated Creatinine Clearance: 57.6 mL/min (A) (by C-G formula based on SCr of 0.55 mg/dL (L)). Liver Function Tests: Recent Labs  Lab 03/25/19 0551  AST 35  ALT 26  ALKPHOS 63  BILITOT 0.7  PROT 5.2*  ALBUMIN 1.9*   No results for input(s): LIPASE, AMYLASE in the last 168 hours. No results for input(s): AMMONIA in the last 168 hours. Coagulation Profile: No results for input(s): INR, PROTIME in the last 168 hours. Cardiac Enzymes: No results for input(s): CKTOTAL, CKMB, CKMBINDEX, TROPONINI in the last 168 hours. BNP (last 3 results) No results for input(s): PROBNP in the last 8760 hours. HbA1C: No results for input(s): HGBA1C in the last 72 hours. CBG: Recent Labs  Lab 03/25/19 1951 03/26/19 0000 03/26/19 0406 03/26/19 0803 03/26/19 1124  GLUCAP 115* 119* 119* 108* 118*   Lipid Profile: No results for input(s): CHOL, HDL, LDLCALC, TRIG, CHOLHDL, LDLDIRECT in the last 72  hours. Thyroid Function Tests: No results for input(s): TSH, T4TOTAL, FREET4, T3FREE, THYROIDAB in the last 72 hours. Anemia Panel: No results for input(s): VITAMINB12, FOLATE, FERRITIN, TIBC, IRON, RETICCTPCT in the last 72 hours. Urine analysis:    Component Value Date/Time   COLORURINE YELLOW 03/18/2019 [place] 03/18/2019 0535   LABSPEC 1.016 03/18/2019 0535   PHURINE 7.0 03/18/2019 0535   GLUCOSEU NEGATIVE 03/18/2019 0535   HGBUR NEGATIVE 03/18/2019 0535   BILIRUBINUR NEGATIVE 03/18/2019 0535   BILIRUBINUR negative 02/05/2019 1610   KETONESUR NEGATIVE 03/18/2019 0535   PROTEINUR NEGATIVE 03/18/2019 0535   UROBILINOGEN 0.2 02/05/2019 1610   UROBILINOGEN 0.2 03/21/2007 0944   NITRITE NEGATIVE 03/18/2019 0535   LEUKOCYTESUR NEGATIVE 03/18/2019 0535   Sepsis Labs: _0 (procalcitonin:4,lacticidven:4)  ) Recent Results (from the past 240 hour(s))  Culture, blood (Routine x 2)     Status: None   Collection Time: 03/18/19  5:35 AM   Specimen: BLOOD RIGHT HAND  Result Value Ref Range Status   Specimen Description   Final    BLOOD RIGHT HAND Performed at [facility], [place] [address], [place], [place] [address]    Special Requests   Final    BOTTLES DRAWN AEROBIC AND ANAEROBIC Blood Culture results may not be optimal due to an inadequate volume of blood received in culture bottles Performed at [facility] [address], [place], [place] [address]    Culture   Final    NO GROWTH 5 DAYS Performed at [facility], [place] [address] [place], [place] [address]    Report Status 03/23/2019 FINAL  Final  Culture, blood (Routine x 2)     Status: None   Collection Time: 03/18/19  5:35 AM   Specimen: BLOOD LEFT FOREARM  Result Value Ref Range Status   Specimen Description   Final    BLOOD LEFT FOREARM Performed at [facility] [address], [place], [place] [address]    Special Requests    Final    BOTTLES DRAWN AEROBIC AND ANAEROBIC Blood Culture adequate volume Performed at [facility] [address], [place], [place] [address]    Culture   Final    NO GROWTH 5 DAYS Performed at [facility], [place] [address] [place], [place] [address]    Report Status 03/23/2019 FINAL  Final  Urine culture     Status: None   Collection Time: 03/18/19  5:35 AM   Specimen: Urine, Random  Result Value Ref Range Status   Specimen Description   Final    URINE, RANDOM Performed at [facility] [address], [place], [place] [address]    Special Requests   Final    NONE Performed at [facility], [place] [address] [place], [place] [address]    Culture   Final    NO GROWTH Performed at [facility], [place] [address], [place], [place] [address]    Report Status 03/19/2019 FINAL  Final  MRSA PCR Screening     Status: None   Collection Time: 03/18/19  9:46 AM   Specimen: Nasal Mucosa; Nasopharyngeal  Result Value Ref Range Status   MRSA by PCR NEGATIVE NEGATIVE Final    Comment:        The GeneXpert MRSA Assay (FDA approved for NASAL specimens only), is one component of a comprehensive MRSA colonization surveillance program. It is not intended to diagnose MRSA infection nor to guide or monitor treatment for MRSA infections. Performed at [facility], [place] [address], [place], [place] [address]   Culture, blood (routine x 2)     Status: None (Preliminary result)   Collection Time: 03/24/19  4:30 PM   Specimen: BLOOD  Result Value Ref Range Status   Specimen Description   Final    BLOOD PICC LINE Performed at [facility] [address] [place], [place] [address]    Special Requests   Final    BOTTLES DRAWN AEROBIC AND ANAEROBIC Blood Culture results may not be optimal due to an excessive volume of blood received in culture bottles Performed at [facility] [address], [place], [place] [address]    Culture   Final    NO GROWTH 2 DAYS Performed at [facility] [address], [place],  [address]    Report Status PENDING  Incomplete      Studies: CT HEAD WO CONTRAST  Result Date: 03/25/2019 CLINICAL DATA:  Encephalopathy.  Confusion. EXAM: CT HEAD WITHOUT CONTRAST TECHNIQUE: Contiguous axial images were obtained from the base of the skull through the vertex without intravenous contrast. COMPARISON:  02/22/2019 FINDINGS: Brain: Age related atrophy. No evidence of acute focal infarction, mass lesion, hemorrhage, hydrocephalus or extra-axial collection. Vascular: There is atherosclerotic calcification of the major vessels at the base of the brain. Skull: Negative Sinuses/Orbits: Clear/normal Other: None IMPRESSION: No acute finding.  Age related atrophy. Electronically Signed   By: Nelson Chimes M.D.   On: 03/25/2019 13:43   CT ANGIO CHEST PE W OR WO CONTRAST  Addendum Date: 03/25/2019   ADDENDUM REPORT: 03/25/2019 15:04 ADDENDUM: Note that patient's right-sided PICC line has been pulled back since the earlier chest x-ray today as tip is still over the SVC just below the level of the head of the right clavicle. Further clinical information was obtained that patient has a sacral decubitus ulcer. Further review of the images suggest a mild midline soft tissues sacral serration. There is only minimal stranding of the underlying subcutaneous fat and no evidence of underlying subcutaneous abscess. No focal bone destruction is seen within the adjacent sacrum/coccyx. There is slight decreased density of the coccygeal tip compared to the remainder of the adjacent sacrum/coccyx. Although unlikely, osteomyelitis is possible at this level. MRI may be helpful if clinically indicated. These findings were discussed with Dr. Nevada Crane. Electronically Signed   By: Marin Olp M.D.   On: 03/25/2019 15:04   Result  Date: 03/25/2019 CLINICAL DATA:   Hypoxemia. Recent COVID-19 positive. History of non-Hodgkin's lymphoma. EXAM: CT ANGIOGRAPHY CHEST CT ABDOMEN AND PELVIS WITH CONTRAST TECHNIQUE: Multidetector CT imaging of the chest was performed using the standard protocol during bolus administration of intravenous contrast. Multiplanar CT image reconstructions and MIPs were obtained to evaluate the vascular anatomy. Multidetector CT imaging of the abdomen and pelvis was performed using the standard protocol during bolus administration of intravenous contrast. CONTRAST:  155m OMNIPAQUE IOHEXOL 350 MG/ML SOLN COMPARISON:  Chest CT 02/24/2019 and abdominopelvic CT 02/22/2019 FINDINGS: CTA CHEST FINDINGS Cardiovascular: Heart is normal size. Mild calcified plaque over the left main in addition moderate calcified plaque over the left anterior descending and lateral circumflex coronary arteries. Mild-to-moderate calcified plaque over the thoracic aorta. Thoracic aorta is normal caliber. Pulmonary arterial system demonstrates no definite emboli. Somewhat poor opacification over the right lower lobar arteries but no definite emboli. Remaining vascular structures are unremarkable. Mediastinum/Nodes: No evidence of mediastinal adenopathy. No left hilar adenopathy. Stable 1.5 cm right hilar lymph node. Slight worsening adenopathy over the left neck base with 3 adjacent enlarged lymph nodes with the largest measuring 2 cm by short axis compatible with known non-Hodgkin's lymphoma. Lungs/Pleura: Lungs are adequately inflated with interval improvement in patient's previously seen bilateral patchy airspace process with minimal residual peripheral airspace density over the right upper lobe. Slight worsening of a small left pleural effusion with stable tiny amount right pleural fluid and associated mild bibasilar atelectasis. Airways are normal. Musculoskeletal: Degenerative change of the spine. Review of the MIP images confirms the above findings. CT ABDOMEN and PELVIS  FINDINGS Hepatobiliary: Several well-defined liver hypodensities unchanged with the largest over the lateral segment of the left lobe measuring 4 cm compatible with cysts. Gallbladder is contracted. Biliary tree is normal. Pancreas: Pancreas is normal. Spleen: Decrease in size of a 2.2 cm hypodensity over the inferior spleen. Adrenals/Urinary Tract: Adrenal glands are normal. Kidneys are normal in size. Mild prominence of the right intrarenal collecting system and ureter. No left-sided hydronephrosis. Numerous renal cysts unchanged. Left ureter is normal. Punctate stone lower pole left kidney. Foley catheter is present within a decompressed bladder. Stomach/Bowel: Percutaneous gastrostomy tube is in adequate position. Stomach is otherwise unremarkable. Small bowel is within normal. Appendix is not visualized. Moderate fecal retention over the rectum as the colon is otherwise unremarkable. Vascular/Lymphatic: Moderate calcified plaque over the abdominal aorta which is normal in caliber. Calcified plaque over the iliac arteries. There is interval worsening of extensive adenopathy over the retroperitoneum/periaortic region worsening adenopathy in the region of the celiac axis and moderate worsening adenopathy posterior to the pancreas in over the mesentery in the left upper quadrant. These findings are compatible with progression of patient's known non-Hodgkin's lymphoma. Reproductive: Normal. Other: No significant free peritoneal fluid or focal inflammatory change. Mild diffuse subcutaneous edema. Musculoskeletal: Degenerative change of the spine and hips. Review of the MIP images confirms the above findings. IMPRESSION: 1.  No evidence of pulmonary embolism. 2. Interval improvement in the previously noted bilateral multifocal pneumonia with mild residual peripheral airspace density over the right upper lobe. Stable tiny amount right pleural fluid with new small left pleural effusion with associated bibasilar  atelectasis. 3. Worsening adenopathy over the left neck base and abdomen/retroperitoneum. Stable right hilar adenopathy. Slight decrease in size of a 2.2 cm hypodense splenic mass. Findings are compatible with patient's known non-Hodgkin's lymphoma. 4. Numerous bilateral renal cysts as well as multiple liver cysts unchanged. Punctate nonobstructing left renal stone. Mild  prominence of the right intrarenal collecting system. 5. Subtle stable lucent lesion over the right iliac crest which may be benign versus metastatic disease. Recommend attention on follow-up. 6. Aortic Atherosclerosis (ICD10-I70.0). Atherosclerotic coronary artery disease. Electronically Signed: By: Marin Olp M.D. On: 03/25/2019 14:16   CT ABDOMEN PELVIS W CONTRAST  Addendum Date: 03/25/2019   ADDENDUM REPORT: 03/25/2019 15:04 ADDENDUM: Note that patient's right-sided PICC line has been pulled back since the earlier chest x-ray today as tip is still over the SVC just below the level of the head of the right clavicle. Further clinical information was obtained that patient has a sacral decubitus ulcer. Further review of the images suggest a mild midline soft tissues sacral serration. There is only minimal stranding of the underlying subcutaneous fat and no evidence of underlying subcutaneous abscess. No focal bone destruction is seen within the adjacent sacrum/coccyx. There is slight decreased density of the coccygeal tip compared to the remainder of the adjacent sacrum/coccyx. Although unlikely, osteomyelitis is possible at this level. MRI may be helpful if clinically indicated. These findings were discussed with Dr. Nevada Crane. Electronically Signed   By: Marin Olp M.D.   On: 03/25/2019 15:04   Result Date: 03/25/2019 CLINICAL DATA:  Hypoxemia. Recent COVID-19 positive. History of non-Hodgkin's lymphoma. EXAM: CT ANGIOGRAPHY CHEST CT ABDOMEN AND PELVIS WITH CONTRAST TECHNIQUE: Multidetector CT imaging of the chest was performed using the  standard protocol during bolus administration of intravenous contrast. Multiplanar CT image reconstructions and MIPs were obtained to evaluate the vascular anatomy. Multidetector CT imaging of the abdomen and pelvis was performed using the standard protocol during bolus administration of intravenous contrast. CONTRAST:  130m OMNIPAQUE IOHEXOL 350 MG/ML SOLN COMPARISON:  Chest CT 02/24/2019 and abdominopelvic CT 02/22/2019 FINDINGS: CTA CHEST FINDINGS Cardiovascular: Heart is normal size. Mild calcified plaque over the left main in addition moderate calcified plaque over the left anterior descending and lateral circumflex coronary arteries. Mild-to-moderate calcified plaque over the thoracic aorta. Thoracic aorta is normal caliber. Pulmonary arterial system demonstrates no definite emboli. Somewhat poor opacification over the right lower lobar arteries but no definite emboli. Remaining vascular structures are unremarkable. Mediastinum/Nodes: No evidence of mediastinal adenopathy. No left hilar adenopathy. Stable 1.5 cm right hilar lymph node. Slight worsening adenopathy over the left neck base with 3 adjacent enlarged lymph nodes with the largest measuring 2 cm by short axis compatible with known non-Hodgkin's lymphoma. Lungs/Pleura: Lungs are adequately inflated with interval improvement in patient's previously seen bilateral patchy airspace process with minimal residual peripheral airspace density over the right upper lobe. Slight worsening of a small left pleural effusion with stable tiny amount right pleural fluid and associated mild bibasilar atelectasis. Airways are normal. Musculoskeletal: Degenerative change of the spine. Review of the MIP images confirms the above findings. CT ABDOMEN and PELVIS FINDINGS Hepatobiliary: Several well-defined liver hypodensities unchanged with the largest over the lateral segment of the left lobe measuring 4 cm compatible with cysts. Gallbladder is contracted. Biliary tree is  normal. Pancreas: Pancreas is normal. Spleen: Decrease in size of a 2.2 cm hypodensity over the inferior spleen. Adrenals/Urinary Tract: Adrenal glands are normal. Kidneys are normal in size. Mild prominence of the right intrarenal collecting system and ureter. No left-sided hydronephrosis. Numerous renal cysts unchanged. Left ureter is normal. Punctate stone lower pole left kidney. Foley catheter is present within a decompressed bladder. Stomach/Bowel: Percutaneous gastrostomy tube is in adequate position. Stomach is otherwise unremarkable. Small bowel is within normal. Appendix is not visualized. Moderate fecal retention  over the rectum as the colon is otherwise unremarkable. Vascular/Lymphatic: Moderate calcified plaque over the abdominal aorta which is normal in caliber. Calcified plaque over the iliac arteries. There is interval worsening of extensive adenopathy over the retroperitoneum/periaortic region worsening adenopathy in the region of the celiac axis and moderate worsening adenopathy posterior to the pancreas in over the mesentery in the left upper quadrant. These findings are compatible with progression of patient's known non-Hodgkin's lymphoma. Reproductive: Normal. Other: No significant free peritoneal fluid or focal inflammatory change. Mild diffuse subcutaneous edema. Musculoskeletal: Degenerative change of the spine and hips. Review of the MIP images confirms the above findings. IMPRESSION: 1.  No evidence of pulmonary embolism. 2. Interval improvement in the previously noted bilateral multifocal pneumonia with mild residual peripheral airspace density over the right upper lobe. Stable tiny amount right pleural fluid with new small left pleural effusion with associated bibasilar atelectasis. 3. Worsening adenopathy over the left neck base and abdomen/retroperitoneum. Stable right hilar adenopathy. Slight decrease in size of a 2.2 cm hypodense splenic mass. Findings are compatible with patient's  known non-Hodgkin's lymphoma. 4. Numerous bilateral renal cysts as well as multiple liver cysts unchanged. Punctate nonobstructing left renal stone. Mild prominence of the right intrarenal collecting system. 5. Subtle stable lucent lesion over the right iliac crest which may be benign versus metastatic disease. Recommend attention on follow-up. 6. Aortic Atherosclerosis (ICD10-I70.0). Atherosclerotic coronary artery disease. Electronically Signed: By: Marin Olp M.D. On: 03/25/2019 14:16    Scheduled Meds: . brinzolamide  1 drop Both Eyes TID  . chlorhexidine  15 mL Mouth Rinse BID  . Chlorhexidine Gluconate Cloth  6 each Topical Daily  . enoxaparin (LOVENOX) injection  40 mg Subcutaneous Q24H  . feeding supplement (PRO-STAT SUGAR FREE 64)  30 mL Per Tube Daily  . free water  170 mL Per Tube Q4H  . guaiFENesin  10 mL Per Tube BID  . latanoprost  1 drop Both Eyes QHS  . mouth rinse  15 mL Mouth Rinse q12n4p  . sodium chloride flush  10-40 mL Intracatheter Q12H    Continuous Infusions: . sodium chloride Stopped (03/18/19 2252)  . ceFEPime (MAXIPIME) IV 2 g (03/26/19 1042)  . feeding supplement (OSMOLITE 1.5 CAL) 1,000 mL (03/25/19 1148)  . metronidazole 500 mg (03/26/19 0504)  . vancomycin 1,250 mg (03/26/19 1044)     LOS: 8 days     Kayleen Memos, MD [facility] Pager [phone]  If 7PM-7AM, please contact night-coverage [email or web address] Password [facility] 03/26/2019, 12:19 PM

## 2019-03-26 NOTE — Progress Notes (Signed)
Spoke with the patient's son [name] via phone.  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  He states he will talk to his mother then call us back with their final decision regarding Mr. Mcardle goals of care.

## 2019-03-26 NOTE — Progress Notes (Signed)
Nutrition Follow-up  RD working remotely.  DOCUMENTATION CODES:   Not applicable  INTERVENTION:   Continue Osmolite 1.5 @ 55 ml/hr via PEG  30 ml Prostat daily    170 ml free water flush every 4 hours  Tube feeding regimen provides 2180 kcal (100% of needs), 98 grams of protein, and 1005 ml of H2O. Total free water: 2025 ml  NUTRITION DIAGNOSIS:   Increased nutrient needs related to acute illness, cancer and cancer related treatments as evidenced by estimated needs.  Ongoing  GOAL:   Patient will meet greater than or equal to 90% of their needs  Met with TF  MONITOR:   TF tolerance, Labs, Weight trends, Skin  REASON FOR ASSESSMENT:   Consult Enteral/tube feeding initiation and management  ASSESSMENT:   86 y.o. male with medical history of dementia, HTN, hyperlipidemia, GERD, and BPH. He presented to the ED from SNF with AMS and respiratory distress. Nursing staff at SNF reported that patient appeared to be in moderate distress and was gurgling with white sputum production and oxygen saturations 74% on room air; he does not wear oxygen at baseline. EMS was called and he was placed on a non-rebreather. Patient was hospitalized 12/31-1/22 due to COVID-19 PNA and questionable neurosyphilis. He was also diagnosed with B-cell non-Hodgkin's lymphoma during that hospitalization.  Reviewed I/O's: -559 ml x 24 hours and +10.3 L since admission  UOP: 1.2 L x 24 hours  Pt unable to provide additional history secondary to dementia.  Per MD notes, pt more lethargic and poor clearance of secretions. ID has been consulted for fevers and further management of antibiotics.   Pt remains NPO and receiving TF via PEG: Osmolite 1.5 @ 55 ml/hr with 30 ml Prostat daily and 100 ml free water flush every 6 hours. Complete regimen providing 2180 kcals, 98 grams protein, and 1405 ml free water daily.   Palliative care continues to follow for goals of care discussions with family. Family  would like to continue current interventions at this time and are hopeful for improvement, however, understanding that pt may be approaching end of life soon.   Labs reviewed: CBGS: 108-119 (inpatient orders for glycemic control are none).   Diet Order:   Diet Order            Diet NPO time specified  Diet effective now              EDUCATION NEEDS:   No education needs have been identified at this time  Skin:  Skin Assessment: Skin Integrity Issues: Skin Integrity Issues:: Stage II, Other (Comment) Stage II: coccyx Other: MASD to buttocks and sacrum  Last BM:  03/25/19  Height:   Ht Readings from Last 1 Encounters:  03/19/19 5' 6" (1.676 m)    Weight:   Wt Readings from Last 1 Encounters:  03/26/19 75.6 kg    Ideal Body Weight:  64.5 kg  BMI:  Body mass index is 26.9 kg/m.  Estimated Nutritional Needs:   Kcal:  2050-2250  Protein:  90-105 grams  Fluid:  2 L    Roxene Alviar A. Jimmye Norman, RD, LDN, [place] Registered Dietitian II Certified Diabetes Care and Education Specialist Pager: [phone] After hours Pager: [phone]

## 2019-03-26 NOTE — Progress Notes (Signed)
Palliative Care Progress Note  Reason for encounter: GOC in light of likely recurrent aspiration with recent COVID 19 infection and new diagnosis of lymphoma  Events of last 24 hours noted including continued aspiration events.  He has been spiking fevers despite antibiotics.    I called and spoke with his son, [name].  [name] report talking with Dr .[place] Crane as well as with Dr. Jana Hakim and wanting to transition to a focus on comfort and work to transition to [employer] for end of life care.   We reviewed current care plan and discussed adjustments to be made to his regimen if the focus of his care is strictly on his comfort.  Discussed residential hospice, care philosophy, and steps in referral process.  -Plan for comfort moving forward.  Discussed with son, [name], and goal is to transition to [employer] for end of life care. - Pain/SOB: morphine as needed - Anxiety: Ativan as needed - Agitation: Haldol as needed - Nausea: zofran or haldol as needed - Secretions: Agree with scop patch.  Also made addition of robinul as needed - Stop interventions not specifically related to his comfort.  Reviewed recommendation and reasoning for discontinuation of abx, labs, IVF, and tube feeding.  Total time: 40 minutes  Greater than 50%  of this time was spent counseling and coordinating care related to the above assessment and plan.  Micheline Rough, MD [place] Team [phone]

## 2019-03-26 NOTE — Care Management Important Message (Signed)
Important Message  Patient Details IM Letter given to Dessa Phi RN Case Manager to present to the Patient Name: [name] MRN: [id number] Date of Birth: Mar 27, 1928   Medicare Important Message Given:  Yes     Kerin Salen 03/26/2019, 11:23 AM

## 2019-03-26 NOTE — Progress Notes (Signed)
Was called back by son [name] after our discussion this AM.  He has discussed everything with his mother, who has healthcare power of attorney, and with his sister.  They have decided they do not want any purely life prolonging measures and that they would prefer for the patient to be moved to [facility] as soon as a bed opens there.  Of course they would want continued everything that is making the patient comfortable.  I discussed this with Dr.Hall who will operationalize the family's decision.  We will follow with you

## 2019-03-26 NOTE — Progress Notes (Signed)
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 This is a terminal development. I would think he has a few days or at most a week to live. At this point he qualifies for [facility] if that is what the team and family decides is best.  I called the patient's son [name] and explained the situation. Anything we do simply to keep [name] alive will give his cancer more time to get into his CNS with possible seizures and strokes, or cause severe bone pain, or emergent bleeding problems. My recommendation is that we move to strict comfort care-- do anything we need to do to make sure he is comfortable but not anything (like his feedings) only aimed at prolonging life.  Son [name] will discuss this with the family and let me or Dr [place] Crane or Dr Domingo Cocking know what their decision is.  Will follow with you  GM

## 2019-03-26 NOTE — Consult Note (Signed)
[facility] Nurse Consult Note: Reason for Consult: re-assess sacral wound Wound type: Deep tissue pressure injury; coccyx Medical adhesive skin injury left upper buttock Pressure Injury POA: Yes Measurement: Coccyx;  4cm x 3cm x 0.1cm  Upper buttock; 12 cm x 4cm x 0cm Wound bed: Coccyx; 50% clean/50% dark purple non blanchable tissue along the right lateral aspect of the wound Buttock;  100% reddened area, skin is intact; unclear if tape or other adhesive was placed here but it does appear to mimic MASD Drainage (amount, consistency, odor) minimal from the sacrum; no odor Periwound: intact Dressing procedure/placement/frequency: DC Saline moist gauze Foam dressing only; monitor for evolution of DTPI.    Re consult if needed, will not follow at this time. Thanks  Mordechai Matuszak [employer], RN,CWOCN, CNS, [place] [phone])

## 2019-03-26 NOTE — Progress Notes (Signed)
PT Cancellation Note  Patient Details Name: [name] MRN: [id number] DOB: June 03, 1928   Cancelled Treatment:      Pt is not able to participate due to medical decline.  Will continue to monitor for changes.  Pt has been evaluated with rec for SNF.   Rica Koyanagi  PTA Acute  Rehabilitation Services Pager      [phone] Office      [phone] \

## 2019-03-27 DIAGNOSIS — Z515 Encounter for palliative care: Secondary | ICD-10-CM

## 2019-03-27 DIAGNOSIS — J690 Pneumonitis due to inhalation of food and vomit: Secondary | ICD-10-CM

## 2019-03-27 DIAGNOSIS — C8590 Non-Hodgkin lymphoma, unspecified, unspecified site: Secondary | ICD-10-CM | POA: Diagnosis present

## 2019-03-27 NOTE — Discharge Summary (Addendum)
Discharge Summary  [name] [id number] DOB: 04-Sep-1928  PCP: [name], MD  Admit date: 03/18/2019 Discharge date: 03/27/2019  Time spent: 35 minutes  Recommendations for Outpatient Follow-up:  1. Continue hospice care at [facility].  Discharge Diagnoses:  Active Hospital Problems   Diagnosis Date Noted  . Severe sepsis ([place]) 03/24/2019  . Comfort measures only status 03/27/2019  . Lymphoma ([place]) 03/27/2019  . DNR (do not resuscitate) 03/26/2019  . Failure to thrive (0-17) 03/26/2019  . Acute metabolic encephalopathy [id number]  . Dysphagia 03/24/2019  . Aspiration pneumonia of both lower lobes due to gastric secretions ([place]) 03/24/2019  . Unstageable pressure ulcer of sacral region ([place]) 03/18/2019  . Diffuse large cell non-Hodgkin's lymphoma ([place]) 03/07/2019  . Advanced dementia ([place]) 02/23/2019  . Hypokalemia 02/23/2019  . Lab test positive for detection of COVID-19 virus 02/23/2019  . Hyperlipidemia 02/23/2019  . GERD (gastroesophageal reflux disease) 02/23/2019  . Loss of appetite 02/23/2019  . Retroperitoneal lymphadenopathy 02/23/2019  . Aorto-iliac atherosclerosis ([place]) 02/08/2019  . Constipation 02/08/2019  . Benign prostatic hyperplasia with urinary obstruction   . Advanced care planning/counseling discussion 03/20/2014  . Dyslipidemia 03/31/2010  . Glaucoma 03/31/2010  . Allergic rhinitis 03/31/2010    Resolved Hospital Problems   Diagnosis Date Noted Date Resolved  . Essential hypertension 03/31/2010 03/27/2019    Vitals:   03/26/19 0410 03/26/19 0553  BP: 125/63   Pulse: 92 (!) 108  Resp: (!) 21   Temp: (!) 100.8 F (38.2 C) 99.3 F (37.4 C)  SpO2: 97%     History of present illness:  86 year old male with dementia, hypertension, hyperlipidemia, GERD and BPH from skilled nursing facility presented with altered mental status and difficulty breathing.  Recent extensive hospitalizations, 02/22/2019-1/20/2021for COVID-19 infection  (positive on 02/22/19), neurosyphilis (treated with 14 days of IV PCN by ID), also diagnosed with B-cell non-Hodgkin's lymphoma. Dysphagia post PEG tube placement and discharged to nursing home on 03/16/19.  Presented back on 03/18/19 with above.  In the emergency room temperature 101, tachycardic, respiratory rate 27. Blood pressure was low normal. Oxygen 96% on room air. WBC 13.7K. Urinalysis unremarkable. Chest x-ray with residual opacities consistent with recent COVID-19 pneumonia superimposed by suspected aspiration pneumonia.  More lethargic (1/27) with audible rhonchorous sounds and poor clearance of secretions. No meaningful improvement.  Recurrent fevers.  T-max 101.5 (03/25/2019).  Blood work shows leukemic transformation of his lymphoma.  Prognosis is very poor.  Per oncology Dr. Jana Hakim: "I would think he has a few days or at most a week to live."  Family made decision for comfort measures only on 03/26/19.  Patient has been accepted at [facility] and will continue hospice care.    03/27/19: Seen and examined.  Shallow breath noted.  Does not appear to be in pain.  Hospital Course:  Principal Problem:   Severe sepsis ([place]) Active Problems:   Dyslipidemia   Glaucoma   Allergic rhinitis   Advanced care planning/counseling discussion   Benign prostatic hyperplasia with urinary obstruction   Aorto-iliac atherosclerosis (HCC)   Constipation   Hypokalemia   Lab test positive for detection of COVID-19 virus   Retroperitoneal lymphadenopathy   Advanced dementia (HCC)   Loss of appetite   Hyperlipidemia   GERD (gastroesophageal reflux disease)   Diffuse large cell non-Hodgkin's lymphoma (HCC)   Unstageable pressure ulcer of sacral region ([place])   Dysphagia   Aspiration pneumonia of both lower lobes due to gastric secretions ([place])   DNR (do not resuscitate)  Failure to thrive ([id number])   Acute metabolic encephalopathy   Comfort measures only status   Lymphoma  ([place])  ASSESSMENT: -Questionable Sepsis in the setting of suspected silent aspiration  -SIRS criteria likely related to lymphoma.               -Leukemic transformation of lymphoma with blasts present.  -Diffuse large cell non-Hodgkin's lymphoma.  -Acute metabolic encephalopathy likely multifactorial, dementia,   acute illness, recent neurosyphilis, recent COVID-19 viral infection, lymphoma.  -Unstageable sacral decubitus ulcer, POA. -Hx of neurosyphillis. -Advanced dementia. -Failure to thrive. -Dysphagia, post peg tube placement. -History of COVID-19 viral infection -Hyperlipidemia. -Hypokalemia. -Comfort care only.  PLAN: Family made decision for comfort measures on 03/26/19.    All focus is on comfort.  Patient has been accepted at [facility] and will continue hospice care.  Death is imminent, likely in the next few days.     Code Status:DNR/Comfort care.    Discharge Exam: BP 125/63 (BP Location: Left Arm)   Pulse (!) 108   Temp 99.3 F (37.4 C) (Axillary)   Resp (!) 21   Ht 5\' 6"  (1.676 m)   Wt 75.6 kg   SpO2 97%   BMI 26.90 kg/m  . General: 86 y.o. year-old male: Very frail appearing.  Does not appear in pain.  Shallow breath noted. . Cardiovascular: Tachycardic with no rubs or gallops.   Marland Kitchen Respiratory: Shallow breath noted with mild rales diffusely and bilaterally. . Abdomen: Peg tube in place with hypoactive bowel sounds noted. . Musculoskeletal: Trace lower extremity edema bilaterally.  Dependent edema in upper extremities bilaterally.   Discharge Instructions You were cared for by a hospitalist during your hospital stay. If you have any questions about your discharge medications or the care you received while you were in the hospital after you are discharged, you can call the unit and asked to speak with the hospitalist on call if the hospitalist that took care of you is not available. Once you are discharged, your primary care physician will handle  any further medical issues. Please note that NO REFILLS for any discharge medications will be authorized once you are discharged, as it is imperative that you return to your primary care physician (or establish a relationship with a primary care physician if you do not have one) for your aftercare needs so that they can reassess your need for medications and monitor your lab values.   Allergies as of 03/27/2019   No Known Allergies     Medication List    STOP taking these medications   acetaminophen 500 MG tablet Commonly known as: TYLENOL   aspirin EC 81 MG tablet   atorvastatin 10 MG tablet Commonly known as: LIPITOR   Azopt 1 % ophthalmic suspension Generic drug: brinzolamide   donepezil 10 MG tablet Commonly known as: ARICEPT   feeding supplement (OSMOLITE 1.2 CAL) Liqd   lactulose 10 GM/15ML Soln enema Commonly known as: CHRONULAC   lactulose 10 GM/15ML solution Commonly known as: CHRONULAC   multivitamin with minerals Tabs tablet   polyethylene glycol 17 g packet Commonly known as: MIRALAX / GLYCOLAX   polyethylene glycol powder 17 GM/SCOOP powder Commonly known as: GLYCOLAX/MIRALAX   QUEtiapine 25 MG tablet Commonly known as: SEROQUEL   senna-docusate 8.6-50 MG tablet Commonly known as: Senokot-S   sodium chloride flush 0.9 % Soln Commonly known as: NS   Vyzulta 0.024 % Soln Generic drug: Latanoprostene Bunod      No Known Allergies  The results of significant diagnostics from this hospitalization (including imaging, microbiology, ancillary and laboratory) are listed below for reference.    Significant Diagnostic Studies: DG Abd 1 View  Result Date: 03/18/2019 CLINICAL DATA:  Sepsis. EXAM: ABDOMEN - 1 VIEW COMPARISON:  03/14/2019 FINDINGS: Single view of the abdomen demonstrates a gastrostomy tube. Dextroscoliosis of the lumbar spine with the apex at L2-L3. Again noted is a large amount of bowel gas throughout the abdomen. Gas in small and large  bowel loops. Evidence for stool in the rectum. Cannot evaluate for free air on this supine portable view. IMPRESSION: 1. Nonobstructive bowel gas pattern.  Stool in the rectum. 2. Gastrostomy tube. Electronically Signed   By: Markus Daft M.D.   On: 03/18/2019 10:19   DG Abd 1 View  Result Date: 03/14/2019 CLINICAL DATA:  86 year old male status post NG tube placement. EXAM: ABDOMEN - 1 VIEW COMPARISON:  Chest radiograph dated 03/04/2019. FINDINGS: There has been interval removal of the previously seen feeding tube and placement of a percutaneous gastrostomy with balloon over the stomach. Partially visualized right-sided PICC with tip in the region of the cavoatrial junction. There is no bowel dilatation. Air is noted within the colon. A 6 mm radiopaque focus to the right of the spine likely corresponds to the vascular calcification of the right renal artery seen on the prior CT. Atherosclerotic calcification of the aorta. Osteopenia with degenerative changes of the spine. No acute osseous pathology. IMPRESSION: Percutaneous gastrostomy with balloon over the stomach. Electronically Signed   By: Anner Crete M.D.   On: 03/14/2019 20:00   DG Abd 1 View  Result Date: 03/04/2019 CLINICAL DATA:  NG tube placement EXAM: ABDOMEN - 1 VIEW COMPARISON:  08/06/2005 FINDINGS: Feeding tube coils in the stomach with the tip in the fundus. Nonobstructive bowel gas pattern. Visualized lungs clear. IMPRESSION: Feeding tube coils in the stomach with the tip in the fundus. Electronically Signed   By: Rolm Baptise M.D.   On: 03/04/2019 19:27   CT HEAD WO CONTRAST  Result Date: 03/25/2019 CLINICAL DATA:  Encephalopathy.  Confusion. EXAM: CT HEAD WITHOUT CONTRAST TECHNIQUE: Contiguous axial images were obtained from the base of the skull through the vertex without intravenous contrast. COMPARISON:  02/22/2019 FINDINGS: Brain: Age related atrophy. No evidence of acute focal infarction, mass lesion, hemorrhage, hydrocephalus  or extra-axial collection. Vascular: There is atherosclerotic calcification of the major vessels at the base of the brain. Skull: Negative Sinuses/Orbits: Clear/normal Other: None IMPRESSION: No acute finding.  Age related atrophy. Electronically Signed   By: Nelson Chimes M.D.   On: 03/25/2019 13:43   CT ANGIO CHEST PE W OR WO CONTRAST  Addendum Date: 03/25/2019   ADDENDUM REPORT: 03/25/2019 15:04 ADDENDUM: Note that patient's right-sided PICC line has been pulled back since the earlier chest x-ray today as tip is still over the SVC just below the level of the head of the right clavicle. Further clinical information was obtained that patient has a sacral decubitus ulcer. Further review of the images suggest a mild midline soft tissues sacral serration. There is only minimal stranding of the underlying subcutaneous fat and no evidence of underlying subcutaneous abscess. No focal bone destruction is seen within the adjacent sacrum/coccyx. There is slight decreased density of the coccygeal tip compared to the remainder of the adjacent sacrum/coccyx. Although unlikely, osteomyelitis is possible at this level. MRI may be helpful if clinically indicated. These findings were discussed with Dr. [place] Crane. Electronically Signed   By: Quillian Quince  Derrel Nip M.D.   On: 03/25/2019 15:04   Result Date: 03/25/2019 CLINICAL DATA:  Hypoxemia. Recent COVID-19 positive. History of non-Hodgkin's lymphoma. EXAM: CT ANGIOGRAPHY CHEST CT ABDOMEN AND PELVIS WITH CONTRAST TECHNIQUE: Multidetector CT imaging of the chest was performed using the standard protocol during bolus administration of intravenous contrast. Multiplanar CT image reconstructions and MIPs were obtained to evaluate the vascular anatomy. Multidetector CT imaging of the abdomen and pelvis was performed using the standard protocol during bolus administration of intravenous contrast. CONTRAST:  122mL OMNIPAQUE IOHEXOL 350 MG/ML SOLN COMPARISON:  Chest CT 02/24/2019 and  abdominopelvic CT 02/22/2019 FINDINGS: CTA CHEST FINDINGS Cardiovascular: Heart is normal size. Mild calcified plaque over the left main in addition moderate calcified plaque over the left anterior descending and lateral circumflex coronary arteries. Mild-to-moderate calcified plaque over the thoracic aorta. Thoracic aorta is normal caliber. Pulmonary arterial system demonstrates no definite emboli. Somewhat poor opacification over the right lower lobar arteries but no definite emboli. Remaining vascular structures are unremarkable. Mediastinum/Nodes: No evidence of mediastinal adenopathy. No left hilar adenopathy. Stable 1.5 cm right hilar lymph node. Slight worsening adenopathy over the left neck base with 3 adjacent enlarged lymph nodes with the largest measuring 2 cm by short axis compatible with known non-Hodgkin's lymphoma. Lungs/Pleura: Lungs are adequately inflated with interval improvement in patient's previously seen bilateral patchy airspace process with minimal residual peripheral airspace density over the right upper lobe. Slight worsening of a small left pleural effusion with stable tiny amount right pleural fluid and associated mild bibasilar atelectasis. Airways are normal. Musculoskeletal: Degenerative change of the spine. Review of the MIP images confirms the above findings. CT ABDOMEN and PELVIS FINDINGS Hepatobiliary: Several well-defined liver hypodensities unchanged with the largest over the lateral segment of the left lobe measuring 4 cm compatible with cysts. Gallbladder is contracted. Biliary tree is normal. Pancreas: Pancreas is normal. Spleen: Decrease in size of a 2.2 cm hypodensity over the inferior spleen. Adrenals/Urinary Tract: Adrenal glands are normal. Kidneys are normal in size. Mild prominence of the right intrarenal collecting system and ureter. No left-sided hydronephrosis. Numerous renal cysts unchanged. Left ureter is normal. Punctate stone lower pole left kidney. Foley  catheter is present within a decompressed bladder. Stomach/Bowel: Percutaneous gastrostomy tube is in adequate position. Stomach is otherwise unremarkable. Small bowel is within normal. Appendix is not visualized. Moderate fecal retention over the rectum as the colon is otherwise unremarkable. Vascular/Lymphatic: Moderate calcified plaque over the abdominal aorta which is normal in caliber. Calcified plaque over the iliac arteries. There is interval worsening of extensive adenopathy over the retroperitoneum/periaortic region worsening adenopathy in the region of the celiac axis and moderate worsening adenopathy posterior to the pancreas in over the mesentery in the left upper quadrant. These findings are compatible with progression of patient's known non-Hodgkin's lymphoma. Reproductive: Normal. Other: No significant free peritoneal fluid or focal inflammatory change. Mild diffuse subcutaneous edema. Musculoskeletal: Degenerative change of the spine and hips. Review of the MIP images confirms the above findings. IMPRESSION: 1.  No evidence of pulmonary embolism. 2. Interval improvement in the previously noted bilateral multifocal pneumonia with mild residual peripheral airspace density over the right upper lobe. Stable tiny amount right pleural fluid with new small left pleural effusion with associated bibasilar atelectasis. 3. Worsening adenopathy over the left neck base and abdomen/retroperitoneum. Stable right hilar adenopathy. Slight decrease in size of a 2.2 cm hypodense splenic mass. Findings are compatible with patient's known non-Hodgkin's lymphoma. 4. Numerous bilateral renal cysts as well as multiple  liver cysts unchanged. Punctate nonobstructing left renal stone. Mild prominence of the right intrarenal collecting system. 5. Subtle stable lucent lesion over the right iliac crest which may be benign versus metastatic disease. Recommend attention on follow-up. 6. Aortic Atherosclerosis (ICD10-I70.0).  Atherosclerotic coronary artery disease. Electronically Signed: By: Marin Olp M.D. On: 03/25/2019 14:16   CT ABDOMEN PELVIS W CONTRAST  Addendum Date: 03/25/2019   ADDENDUM REPORT: 03/25/2019 15:04 ADDENDUM: Note that patient's right-sided PICC line has been pulled back since the earlier chest x-ray today as tip is still over the SVC just below the level of the head of the right clavicle. Further clinical information was obtained that patient has a sacral decubitus ulcer. Further review of the images suggest a mild midline soft tissues sacral serration. There is only minimal stranding of the underlying subcutaneous fat and no evidence of underlying subcutaneous abscess. No focal bone destruction is seen within the adjacent sacrum/coccyx. There is slight decreased density of the coccygeal tip compared to the remainder of the adjacent sacrum/coccyx. Although unlikely, osteomyelitis is possible at this level. MRI may be helpful if clinically indicated. These findings were discussed with Dr. [place] Crane. Electronically Signed   By: Marin Olp M.D.   On: 03/25/2019 15:04   Result Date: 03/25/2019 CLINICAL DATA:  Hypoxemia. Recent COVID-19 positive. History of non-Hodgkin's lymphoma. EXAM: CT ANGIOGRAPHY CHEST CT ABDOMEN AND PELVIS WITH CONTRAST TECHNIQUE: Multidetector CT imaging of the chest was performed using the standard protocol during bolus administration of intravenous contrast. Multiplanar CT image reconstructions and MIPs were obtained to evaluate the vascular anatomy. Multidetector CT imaging of the abdomen and pelvis was performed using the standard protocol during bolus administration of intravenous contrast. CONTRAST:  166mL OMNIPAQUE IOHEXOL 350 MG/ML SOLN COMPARISON:  Chest CT 02/24/2019 and abdominopelvic CT 02/22/2019 FINDINGS: CTA CHEST FINDINGS Cardiovascular: Heart is normal size. Mild calcified plaque over the left main in addition moderate calcified plaque over the left anterior descending and  lateral circumflex coronary arteries. Mild-to-moderate calcified plaque over the thoracic aorta. Thoracic aorta is normal caliber. Pulmonary arterial system demonstrates no definite emboli. Somewhat poor opacification over the right lower lobar arteries but no definite emboli. Remaining vascular structures are unremarkable. Mediastinum/Nodes: No evidence of mediastinal adenopathy. No left hilar adenopathy. Stable 1.5 cm right hilar lymph node. Slight worsening adenopathy over the left neck base with 3 adjacent enlarged lymph nodes with the largest measuring 2 cm by short axis compatible with known non-Hodgkin's lymphoma. Lungs/Pleura: Lungs are adequately inflated with interval improvement in patient's previously seen bilateral patchy airspace process with minimal residual peripheral airspace density over the right upper lobe. Slight worsening of a small left pleural effusion with stable tiny amount right pleural fluid and associated mild bibasilar atelectasis. Airways are normal. Musculoskeletal: Degenerative change of the spine. Review of the MIP images confirms the above findings. CT ABDOMEN and PELVIS FINDINGS Hepatobiliary: Several well-defined liver hypodensities unchanged with the largest over the lateral segment of the left lobe measuring 4 cm compatible with cysts. Gallbladder is contracted. Biliary tree is normal. Pancreas: Pancreas is normal. Spleen: Decrease in size of a 2.2 cm hypodensity over the inferior spleen. Adrenals/Urinary Tract: Adrenal glands are normal. Kidneys are normal in size. Mild prominence of the right intrarenal collecting system and ureter. No left-sided hydronephrosis. Numerous renal cysts unchanged. Left ureter is normal. Punctate stone lower pole left kidney. Foley catheter is present within a decompressed bladder. Stomach/Bowel: Percutaneous gastrostomy tube is in adequate position. Stomach is otherwise unremarkable. Small bowel is  within normal. Appendix is not visualized.  Moderate fecal retention over the rectum as the colon is otherwise unremarkable. Vascular/Lymphatic: Moderate calcified plaque over the abdominal aorta which is normal in caliber. Calcified plaque over the iliac arteries. There is interval worsening of extensive adenopathy over the retroperitoneum/periaortic region worsening adenopathy in the region of the celiac axis and moderate worsening adenopathy posterior to the pancreas in over the mesentery in the left upper quadrant. These findings are compatible with progression of patient's known non-Hodgkin's lymphoma. Reproductive: Normal. Other: No significant free peritoneal fluid or focal inflammatory change. Mild diffuse subcutaneous edema. Musculoskeletal: Degenerative change of the spine and hips. Review of the MIP images confirms the above findings. IMPRESSION: 1.  No evidence of pulmonary embolism. 2. Interval improvement in the previously noted bilateral multifocal pneumonia with mild residual peripheral airspace density over the right upper lobe. Stable tiny amount right pleural fluid with new small left pleural effusion with associated bibasilar atelectasis. 3. Worsening adenopathy over the left neck base and abdomen/retroperitoneum. Stable right hilar adenopathy. Slight decrease in size of a 2.2 cm hypodense splenic mass. Findings are compatible with patient's known non-Hodgkin's lymphoma. 4. Numerous bilateral renal cysts as well as multiple liver cysts unchanged. Punctate nonobstructing left renal stone. Mild prominence of the right intrarenal collecting system. 5. Subtle stable lucent lesion over the right iliac crest which may be benign versus metastatic disease. Recommend attention on follow-up. 6. Aortic Atherosclerosis (ICD10-I70.0). Atherosclerotic coronary artery disease. Electronically Signed: By: Marin Olp M.D. On: 03/25/2019 14:16   IR GASTROSTOMY TUBE MOD SED  Result Date: 03/10/2019 INDICATION: COVID positive, dysphagia EXAM:  FLUOROSCOPIC 20 FRENCH PULL-THROUGH GASTROSTOMY Date:  03/09/2019 03/09/2019 5:48 pm Radiologist:  Jerilynn Mages. Daryll Brod, MD Guidance:  Ultrasound and fluoroscopic MEDICATIONS: Ancef 2 g; Antibiotics were administered within 1 hour of the procedure. Glucagon 1 mg IV ANESTHESIA/SEDATION: Versed 1.0 mg IV; Fentanyl 0 mcg IV Moderate Sedation Time:  10 minutes The patient was continuously monitored during the procedure by the interventional radiology nurse under my direct supervision. CONTRAST:  20 cc-administered into the gastric lumen. FLUOROSCOPY TIME:  Fluoroscopy Time: 2 minutes 6 seconds (12 mGy). COMPLICATIONS: None immediate. PROCEDURE: Informed consent was obtained from the patient following explanation of the procedure, risks, benefits and alternatives. The patient understands, agrees and consents for the procedure. All questions were addressed. A time out was performed. Maximal barrier sterile technique utilized including caps, mask, sterile gowns, sterile gloves, large sterile drape, hand hygiene, and betadine prep. The left upper quadrant was sterilely prepped and draped. An oral gastric catheter was inserted into the stomach under fluoroscopy. The existing nasogastric feeding tube was removed. Air was injected into the stomach for insufflation and visualization under fluoroscopy. The air distended stomach was confirmed beneath the anterior abdominal wall in the frontal and lateral projections. Under sterile conditions and local anesthesia, a 45 gauge trocar needle was utilized to access the stomach percutaneously beneath the left subcostal margin. Needle position was confirmed within the stomach under biplane fluoroscopy. Contrast injection confirmed position also. A single T tack was deployed for gastropexy. Over an Amplatz guide wire, a 9-French sheath was inserted into the stomach. A snare device was utilized to capture the oral gastric catheter. The snare device was pulled retrograde from the stomach up the  esophagus and out the oropharynx. The 20-French pull-through gastrostomy was connected to the snare device and pulled antegrade through the oropharynx down the esophagus into the stomach and then through the percutaneous tract external to the  patient. The gastrostomy was assembled externally. Contrast injection confirms position in the stomach. Images were obtained for documentation. The patient tolerated procedure well. No immediate complication. IMPRESSION: Fluoroscopic insertion of a 20-French "pull-through" gastrostomy. Electronically Signed   By: Jerilynn Mages.  Shick M.D.   On: 03/10/2019 09:19   DG CHEST PORT 1 VIEW  Result Date: 03/25/2019 CLINICAL DATA:  Hypoxia. EXAM: PORTABLE CHEST 1 VIEW COMPARISON:  03/24/2019 FINDINGS: Right-sided PICC line unchanged. Lungs are hypoinflated with persistent left base opacification which may be due to small effusion/atelectasis versus infection. Slight prominence of the right infrahilar markings. Cardiomediastinal silhouette and remainder of the exam is unchanged. IMPRESSION: Stable left base opacification likely small effusion with atelectasis although infection is possible. Right-sided PICC line unchanged. Electronically Signed   By: Marin Olp M.D.   On: 03/25/2019 09:51   DG CHEST PORT 1 VIEW  Result Date: 03/24/2019 CLINICAL DATA:  Evaluate pneumonia EXAM: PORTABLE CHEST 1 VIEW COMPARISON:  03/21/2019 FINDINGS: Stable cardiomediastinal contours. No appreciable residual airspace opacity in the right upper lobe. Improved aeration of the right lung base. Mild residual streaky opacity within the left lung base. Right-sided PICC line remains in place. No pneumothorax. IMPRESSION: Improved aeration of the right upper lobe and right lung base. Mild residual streaky opacity within the left lung base. Electronically Signed   By: Davina Poke D.O.   On: 03/24/2019 15:34   DG CHEST PORT 1 VIEW  Result Date: 03/21/2019 CLINICAL DATA:  Recurrent aspiration. EXAM: PORTABLE  CHEST 1 VIEW COMPARISON:  Chest x-ray dated March 18, 2019. FINDINGS: Unchanged right upper extremity PICC line with tip at the cavoatrial junction. The heart size and mediastinal contours are within normal limits. Normal pulmonary vascularity. Similar appearing streaky opacities in the central right upper lobe and both lung bases. Unchanged elevation of the right hemidiaphragm. No pleural effusion or pneumothorax. No acute osseous abnormality. IMPRESSION: 1. Similar appearing streaky airspace disease in the central right upper lobe and both lung bases. No new consolidation. Electronically Signed   By: Titus Dubin M.D.   On: 03/21/2019 14:07   DG Chest Port 1 View  Result Date: 03/18/2019 CLINICAL DATA:  Shortness of breath, respiratory distress EXAM: PORTABLE CHEST 1 VIEW COMPARISON:  CT chest dated 02/24/2019 FINDINGS: Mild faint residual opacities in the central right upper lobe and bilateral lower lobes. Eventration of right hemidiaphragm. No pleural effusion or pneumothorax. The heart is normal in size.  Thoracic aortic atherosclerosis. IMPRESSION: Mild faint residual pulmonary opacities in this patient with recent COVID pneumonia. Electronically Signed   By: Julian Hy M.D.   On: 03/18/2019 07:40   [place] CORE BIOPSY (LYMPH NODES)  Result Date: 02/28/2019 INDICATION: 86 year old male with a history supraclavicular adenopathy, possible lymphoma EXAM: ULTRASOUND-GUIDED BIOPSY OF LEFT SUPRACLAVICULAR/CERVICAL NODES MEDICATIONS: None. ANESTHESIA/SEDATION: None FLUOROSCOPY TIME:  None COMPLICATIONS: None PROCEDURE: Informed written consent was obtained from the patient after a thorough discussion of the procedural risks, benefits and alternatives. All questions were addressed. Maximal Sterile Barrier Technique was utilized including caps, mask, sterile gowns, sterile gloves, sterile drape, hand hygiene and skin antiseptic. A timeout was performed prior to the initiation of the procedure. Patient  positioned supine position. Ultrasound survey was performed with images stored sent to PACs. The patient is prepped and draped in the usual sterile fashion. 1% lidocaine was used for local anesthesia. Using ultrasound guidance, multiple 18 gauge core biopsy were achieved of left supraclavicular/cervical lymph nodes. Tissues placed into fresh specimen/saline container. Final image was stored. Patient  tolerated the procedure well and remained hemodynamically stable throughout. No complications were encountered and no significant blood loss. IMPRESSION: Status post ultrasound-guided biopsy of left supraclavicular lymph nodes. Signed, Dulcy Fanny. Dellia Nims, RPVI [facility] Rad[facility] Electronically Signed   By: Corrie Mckusick D.O.   On: 02/28/2019 17:15   [place] EKG SITE RITE  Result Date: 03/02/2019 If Site Rite image not attached, placement could not be confirmed due to current cardiac rhythm.  DG FL GUIDED LUMBAR PUNCTURE  Result Date: 03/01/2019 CLINICAL DATA:  Encephalopathy.  Positive syphilis screening test. EXAM: DIAGNOSTIC LUMBAR PUNCTURE UNDER FLUOROSCOPIC GUIDANCE FLUOROSCOPY TIME:  Fluoroscopy Time:  1 minutes 18 seconds Radiation exposure: 14.0 mGy Number of Acquired Spot Images: 0 PROCEDURE: Due to the patient's altered mental status, informed consent was obtained by telephone from the patient's son Braylee Mehrotra. With the patient prone, the lower back was prepped with Betadine. 1% Lidocaine was used for local anesthesia. Lumbar puncture was performed at the L4-5 level using a 20 gauge needle with return of clear CSF with an opening pressure of 9 cm water. 10 ml of CSF were obtained for laboratory studies. The patient tolerated the procedure well and there were no apparent complications. IMPRESSION: Successful diagnostic lumbar puncture performed at L4-5 under fluoroscopic guidance. No immediate complication. Electronically Signed   By: Marlaine Hind M.D.   On:  03/01/2019 16:03    Microbiology: Recent Results (from the past 240 hour(s))  Culture, blood (Routine x 2)     Status: None   Collection Time: 03/18/19  5:35 AM   Specimen: BLOOD RIGHT HAND  Result Value Ref Range Status   Specimen Description   Final    BLOOD RIGHT HAND Performed at [facility], [place] [address], [place], [place] [address]    Special Requests   Final    BOTTLES DRAWN AEROBIC AND ANAEROBIC Blood Culture results may not be optimal due to an inadequate volume of blood received in culture bottles Performed at [place] [address] [place], [place] [address]    Culture   Final    NO GROWTH 5 DAYS Performed at [facility], [place] [address], [place], [place] [address]    Report Status 03/23/2019 FINAL  Final  Culture, blood (Routine x 2)     Status: None   Collection Time: 03/18/19  5:35 AM   Specimen: BLOOD LEFT FOREARM  Result Value Ref Range Status   Specimen Description   Final    BLOOD LEFT FOREARM Performed at [place] [address], [place], [place] [address]    Special Requests   Final    BOTTLES DRAWN AEROBIC AND ANAEROBIC Blood Culture adequate volume Performed at [place] [address] [place], [place] [address]    Culture   Final    NO GROWTH 5 DAYS Performed at [facility], [place] [address], [place], [place] [address]    Report Status 03/23/2019 FINAL  Final  Urine culture     Status: None   Collection Time: 03/18/19  5:35 AM   Specimen: Urine, Random  Result Value Ref Range Status   Specimen Description   Final    URINE, RANDOM Performed at [place] [address] [place], [place] [address]    Special Requests   Final    NONE Performed at [facility], [place] [address], [place],  [address]  Culture   Final    NO GROWTH Performed at [facility], [place] [address], [place],  [place] [address]    Report Status 03/19/2019 FINAL  Final  MRSA PCR Screening     Status: None   Collection Time: 03/18/19  9:46 AM   Specimen: Nasal Mucosa; Nasopharyngeal  Result Value Ref Range Status   MRSA by PCR NEGATIVE NEGATIVE Final    Comment:        The GeneXpert MRSA Assay (FDA approved for NASAL specimens only), is one component of a comprehensive MRSA colonization surveillance program. It is not intended to diagnose MRSA infection nor to guide or monitor treatment for MRSA infections. Performed at [facility], [place] [address] [place], [place] [address]   Culture, blood (routine x 2)     Status: None (Preliminary result)   Collection Time: 03/24/19  4:30 PM   Specimen: BLOOD  Result Value Ref Range Status   Specimen Description BLOOD PICC LINE  Final   Special Requests   Final    BOTTLES DRAWN AEROBIC AND ANAEROBIC Blood Culture results may not be optimal due to an excessive volume of blood received in culture bottles Performed at [facility], [place] [address], [place],  [address]    Culture NO GROWTH 3 DAYS  Final   Report Status PENDING  Incomplete     Labs: Basic Metabolic Panel: Recent Labs  Lab 03/21/19 0424 03/22/19 1152 03/25/19 0551 03/26/19 0623  NA 136 137 140 139  K 4.4 4.2 4.4 4.2  CL 108 107 108 108  CO2 21* 24 23 27   GLUCOSE 128* 138* 138* 135*  BUN 29* 40* 38* 36*  CREATININE 0.76 0.76 0.50* 0.55*  CALCIUM 8.2* 8.3* 7.9* 8.0*  MG  --   --  2.6*  --   PHOS  --   --  3.3  --    Liver Function Tests: Recent Labs  Lab 03/25/19 0551  AST 35  ALT 26  ALKPHOS 63  BILITOT 0.7  PROT 5.2*  ALBUMIN 1.9*   No results for input(s): LIPASE, AMYLASE in the last 168 hours. No results for input(s): AMMONIA in the last 168 hours. CBC: Recent Labs  Lab 03/23/19 0404 03/24/19 0427 03/25/19 0358 03/25/19 0551 03/26/19 0623  WBC 21.9* 23.6* 26.7* 29.1* 27.5*  NEUTROABS 14.2* 8.1* 8.8* 10.8* 18.2*   HGB 10.7* 10.5* 11.0* 10.7* 11.0*  HCT 34.1* 33.5* 36.0* 34.5* 35.7*  MCV 97.2 98.5 98.4 98.9 99.4  PLT 262 259 308 310 324   Cardiac Enzymes: No results for input(s): CKTOTAL, CKMB, CKMBINDEX, TROPONINI in the last 168 hours. BNP: BNP (last 3 results) Recent Labs    03/25/19 1129  BNP 45.5    ProBNP (last 3 results) No results for input(s): PROBNP in the last 8760 hours.  CBG: Recent Labs  Lab 03/26/19 0000 03/26/19 0406 03/26/19 0803 03/26/19 1124 03/26/19 1634  GLUCAP 119* 119* 108* 118* 109*       Signed:  Kayleen Memos, MD [facility] 03/27/2019, 12:52 PM

## 2019-03-27 NOTE — TOC Transition Note (Signed)
Transition of Care [facility]) - CM/SW Discharge Note   Patient Details  Name: EUGENE SHADOWENS MRN: [id number] Date of Birth: Dec 11, 1928  Transition of Care [facility]) CM/SW Contact:  Dessa Phi, RN Phone Number: 03/27/2019, 9:54 AM   Clinical Narrative: Referral to [facility]-accepted.Await rm,the PTAR for transport.      Final next level of care: [place] Barriers to Discharge: No Barriers Identified   Patient Goals and CMS Choice Patient states their goals for this hospitalization and ongoing recovery are:: return backto SNF      Discharge Placement              Patient chooses bed at: Other - please specify in the comment section below:([facility])   Name of family member notified: [name] son [phone] Patient and family notified of of transfer: 03/27/19  Discharge Plan and Services   Discharge Planning Services: CM Consult                                 Social Determinants of Health (SDOH) Interventions     Readmission Risk Interventions Readmission Risk Prevention Plan 03/20/2019  Transportation Screening Complete  Medication Review Press photographer) Complete  PCP or Specialist appointment within 3-5 days of discharge Complete  HRI or Home Care Consult Complete  SW Recovery Care/Counseling Consult Complete  Palliative Care Screening Complete  Skilled Nursing Facility Complete  Some recent data might be hidden

## 2019-03-27 NOTE — Consult Note (Signed)
   [facility] CM Inpatient Consult   03/27/2019  [name] 09-11-1928 [id number]    Update Note:   Following discharge disposition of this Medicare/ NextGen patient with extreme high risk score for unplanned readmission and hospitalizations.  Brief chart review of transition of care CM note andPalliative careconsult reveal thatpatient is transitioning to a Hospice Medical Facility([facility]).  Patient will receive full care managementservicesunder the Hospice facility.  No needsidentifiedfor [facility] at this time.    For questions, please call:  Edwena Felty A. Nikkia Devoss, BSN, RN-BC [facility] Liaison Cell: [phone]

## 2019-03-27 NOTE — Progress Notes (Signed)
Report called to [facility] to Nurse Arbie Cookey.

## 2019-03-27 NOTE — Progress Notes (Addendum)
[facility] room available for [name] today. Spoke with spouse and son [name] by phone. They confirm desire to transfer today. Paperwork is in process with son [name]. Will update TOC manager when complete. Addendum at 11:12: Paperwork complete. Please send once DC summary ready. Thank you.  Will need DC summary sent to [phone] prior to patient leaving the unit.  RN please call report to [facility] prior to patient leaving the unit [phone].  Thank you,  Brett Conte, LCSW [phone]

## 2019-03-27 NOTE — Progress Notes (Signed)
Daily Progress Note   Patient Name: [name]       Date: 03/27/2019 DOB: 10-13-1928  Age: 86 y.o. MRN#: [id number] Attending Physician: Kayleen Memos, DO Primary Care Physician: [name], MD Admit Date: 03/18/2019  Reason for Consultation/Follow-up: Terminal Care  Subjective:  patient resting in bed, with shallow breathing. Doesn't open eyes or follow commands, mouth open. Not displaying signs of distress/discomfort. Chart reviewed, medication history noted. Discussed with [facility] colleague Juliann Pulse. No family at bedside. Note plans in place for transfer to residential hospice when bed available.   Length of Stay: 9  Current Medications: Scheduled Meds:  . brinzolamide  1 drop Both Eyes TID  . chlorhexidine  15 mL Mouth Rinse BID  . Chlorhexidine Gluconate Cloth  6 each Topical Daily  . latanoprost  1 drop Both Eyes QHS  . scopolamine  1 patch Transdermal Q72H    Continuous Infusions:   PRN Meds: glycopyrrolate, haloperidol lactate, lip balm, LORazepam, morphine injection  Physical Exam         Frail elderly gentleman No distress Non verbal Has PEG Shallow breathing mouth open Trace edema  Vital Signs: BP 125/63 (BP Location: Left Arm)   Pulse (!) 108   Temp 99.3 F (37.4 C) (Axillary)   Resp (!) 21   Ht 5\' 6"  (1.676 m)   Wt 75.6 kg   SpO2 97%   BMI 26.90 kg/m  SpO2: SpO2: 97 % O2 Device: O2 Device: Nasal Cannula O2 Flow Rate: O2 Flow Rate (L/min): 2 L/min  Intake/output summary:   Intake/Output Summary (Last 24 hours) at 03/27/2019 1028 Last data filed at 03/27/2019 0600 Gross per 24 hour  Intake 550.31 ml  Output 400 ml  Net 150.31 ml   LBM: Last BM Date: 03/25/19 Baseline Weight: Weight: 71.2 kg Most recent weight: Weight: 75.6 kg         Palliative Assessment/Data:    Flowsheet Rows     Most Recent Value  Intake Tab  Referral Department  Hospitalist  Unit at Time of Referral  Med/Surg Unit  Palliative Care Primary Diagnosis  Cancer  Date Notified  03/18/19  Palliative Care Type  Return patient Palliative Care  Reason for referral  Clarify Goals of Care  Date of Admission  03/18/19  Date first seen by Palliative Care  03/20/19  # of days Palliative referral response time  2 Day(s)  # of days IP prior to Palliative referral  0  Clinical Assessment  Palliative Performance Scale Score  30%  Psychosocial & Spiritual Assessment  Palliative Care Outcomes  Patient/Family meeting held?  Yes  Who was at the meeting?  Son via phone      Patient Active Problem List   Diagnosis Date Noted  . Comfort measures only status 03/27/2019  . Lymphoma ([place]) 03/27/2019  . DNR (do not resuscitate) 03/26/2019  . Failure to thrive (0-17) 03/26/2019  . Acute metabolic encephalopathy [id number]  . Severe sepsis ([place]) 03/24/2019  . Dysphagia 03/24/2019  . Aspiration pneumonia of both lower lobes due to gastric secretions ([place]) 03/24/2019  . Stage IV pressure ulcer of sacral region ([place]) 03/18/2019  . Diffuse large cell non-Hodgkin's lymphoma ([place]) 03/07/2019  . Weakness 02/23/2019  . Hypokalemia 02/23/2019  . Lab test positive for detection of COVID-19 virus 02/23/2019  . Cerebral atrophy ([place]) 02/23/2019  . Paranasal sinus disease 02/23/2019  . Retroperitoneal lymphadenopathy 02/23/2019  . Splenic mass 02/23/2019  . Pneumonia due to COVID-19 virus 02/23/2019  . Advanced dementia ([place]) 02/23/2019  . Delirium due to another medical condition, acute, hyperactive 02/23/2019  . Loss of appetite 02/23/2019  . Hyperlipidemia 02/23/2019  . GERD (gastroesophageal reflux disease) 02/23/2019  . HIV test positive ([place]) 02/23/2019  . Aorto-iliac atherosclerosis ([place]) 02/08/2019  . Constipation 02/08/2019  . Lumbar scoliosis 04/20/2018   . Prostate nodule 04/20/2018  . Hearing loss 10/19/2017  . Benign prostatic hyperplasia with urinary obstruction   . Skin lesions 10/10/2014  . Lower back pain 10/10/2014  . Advanced care planning/counseling discussion 03/20/2014  . MCI (mild cognitive impairment) with memory loss 03/20/2014  . Medicare annual wellness visit, subsequent 01/04/2012  . Cardiac murmur 01/04/2012  . Dyslipidemia 03/31/2010  . Glaucoma 03/31/2010  . Essential hypertension 03/31/2010  . Allergic rhinitis 03/31/2010  . SMALL BOWEL OBSTRUCTION, HX OF 03/31/2010  . NEPHROLITHIASIS, HX OF 03/31/2010    Palliative Care Assessment & Plan   Patient Profile:  86 year old male with dementia, hypertension, hyperlipidemia, GERD and BPH from skilled nursing facility presented with altered mental status and difficulty breathing.  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 Minimally responsive  Recommendations/Plan:  Agree with current mode of care Comfort measures Transfer to residential hospice  Goals of Care and Additional Recommendations:  Limitations on Scope of Treatment: Full Comfort Care  Code Status:    Code Status Orders  (From admission, onward)         Start     Ordered   03/18/19 1004  Do not attempt resuscitation (DNR)  Continuous    Question Answer Comment  In the event of cardiac or respiratory ARREST Do not call a "code blue"   In the event of cardiac or respiratory ARREST Do not perform Intubation, CPR, defibrillation or ACLS   In the event of cardiac or respiratory ARREST Use medication by any route, position, wound care, and other measures to relive pain and suffering. May use oxygen, suction and manual  treatment of airway obstruction as needed for comfort.   Comments Verified by patient's HCPOA spouse [name] and son [name] on 03/18/2019      03/18/19 1004        Code Status  History    Date Active Date Inactive Code Status Order ID Comments User Context   03/18/2019 0835 03/18/2019 1004 Full Code [id number]  [place] Eric J, [place] [facility]   02/22/2019 2052 03/17/2019 0235 Full Code [id number]  Hosie Poisson, MD Inpatient   Advance Care Planning Activity       Prognosis:   hours to days.   Discharge Planning:  Hospice facility  Care plan was discussed with  [facility]  Thank you for allowing the Palliative Medicine Team to assist in the care of this patient.   Time In: 10 Time Out: 10.25 Total Time 25 Prolonged Time Billed  no       Greater than 50%  of this time was spent counseling and coordinating care related to the above assessment and plan.  Loistine Chance, MD  Please contact Palliative Medicine Team phone at [phone] for questions and concerns.

## 2019-03-27 NOTE — Progress Notes (Signed)
[place] received from [place] 03/26/2019 for family interest in [facility]. Chart is being reviewed for eligibility/availability.   Will contact family this morning to acknowledge and give updates.   Please do not hesitate call with questions.  Thank you,  Erling Conte, LCSW [phone]  Hilma Favors are listed daily on AMION under Hospice and [place]

## 2019-03-27 NOTE — TOC Transition Note (Signed)
Transition of Care [facility]) - CM/SW Discharge Note   Patient Details  Name: YUVAAN KAUK MRN: [id number] Date of Birth: 1928/11/06  Transition of [facility]) CM/SW Contact:  Dessa Phi, RN Phone Number: 03/27/2019, 1:20 PM   Clinical Narrative:d/c summary faxed w/confirmation. PTAR called for d/c to [place] aware of tel# for report.       Final next level of care: [place] Barriers to Discharge: No Barriers Identified   Patient Goals and CMS Choice Patient states their goals for this hospitalization and ongoing recovery are:: return backto SNF      Discharge Placement              Patient chooses bed at: Other - please specify in the comment section below:([facility])   Name of family member notified: [name] son [phone] Patient and family notified of of transfer: 03/27/19  Discharge Plan and Services   Discharge Planning Services: CM Consult                                 Social Determinants of Health (SDOH) Interventions     Readmission Risk Interventions Readmission Risk Prevention Plan 03/20/2019  Transportation Screening Complete  Medication Review Press photographer) Complete  PCP or Specialist appointment within 3-5 days of discharge Complete  HRI or Home Care Consult Complete  SW Recovery Care/Counseling Consult Complete  Palliative Care Screening Complete  Skilled Nursing Facility Complete  Some recent data might be hidden

## 2019-03-29 LAB — CULTURE, BLOOD (ROUTINE X 2): Culture: NO GROWTH

## 2019-04-03 ENCOUNTER — Telehealth: Payer: Self-pay | Admitting: Family Medicine

## 2019-04-03 NOTE — Telephone Encounter (Signed)
Patient passed away April 05, 2019. Spoke with wife and son, expressed my condolences.

## 2019-04-20 ENCOUNTER — Ambulatory Visit: Payer: Medicare Other

## 2019-04-20 ENCOUNTER — Encounter: Payer: Medicare Other | Admitting: Family Medicine

## 2019-04-23 DEATH — deceased

## 2020-11-08 IMAGING — DX DG ABDOMEN 1V
1 series · 1 of 1 positions shown · non-contrast
Comparison: 08/06/2005

CLINICAL DATA: NG tube placement

EXAM:
ABDOMEN - 1 VIEW

[abdomen kub]
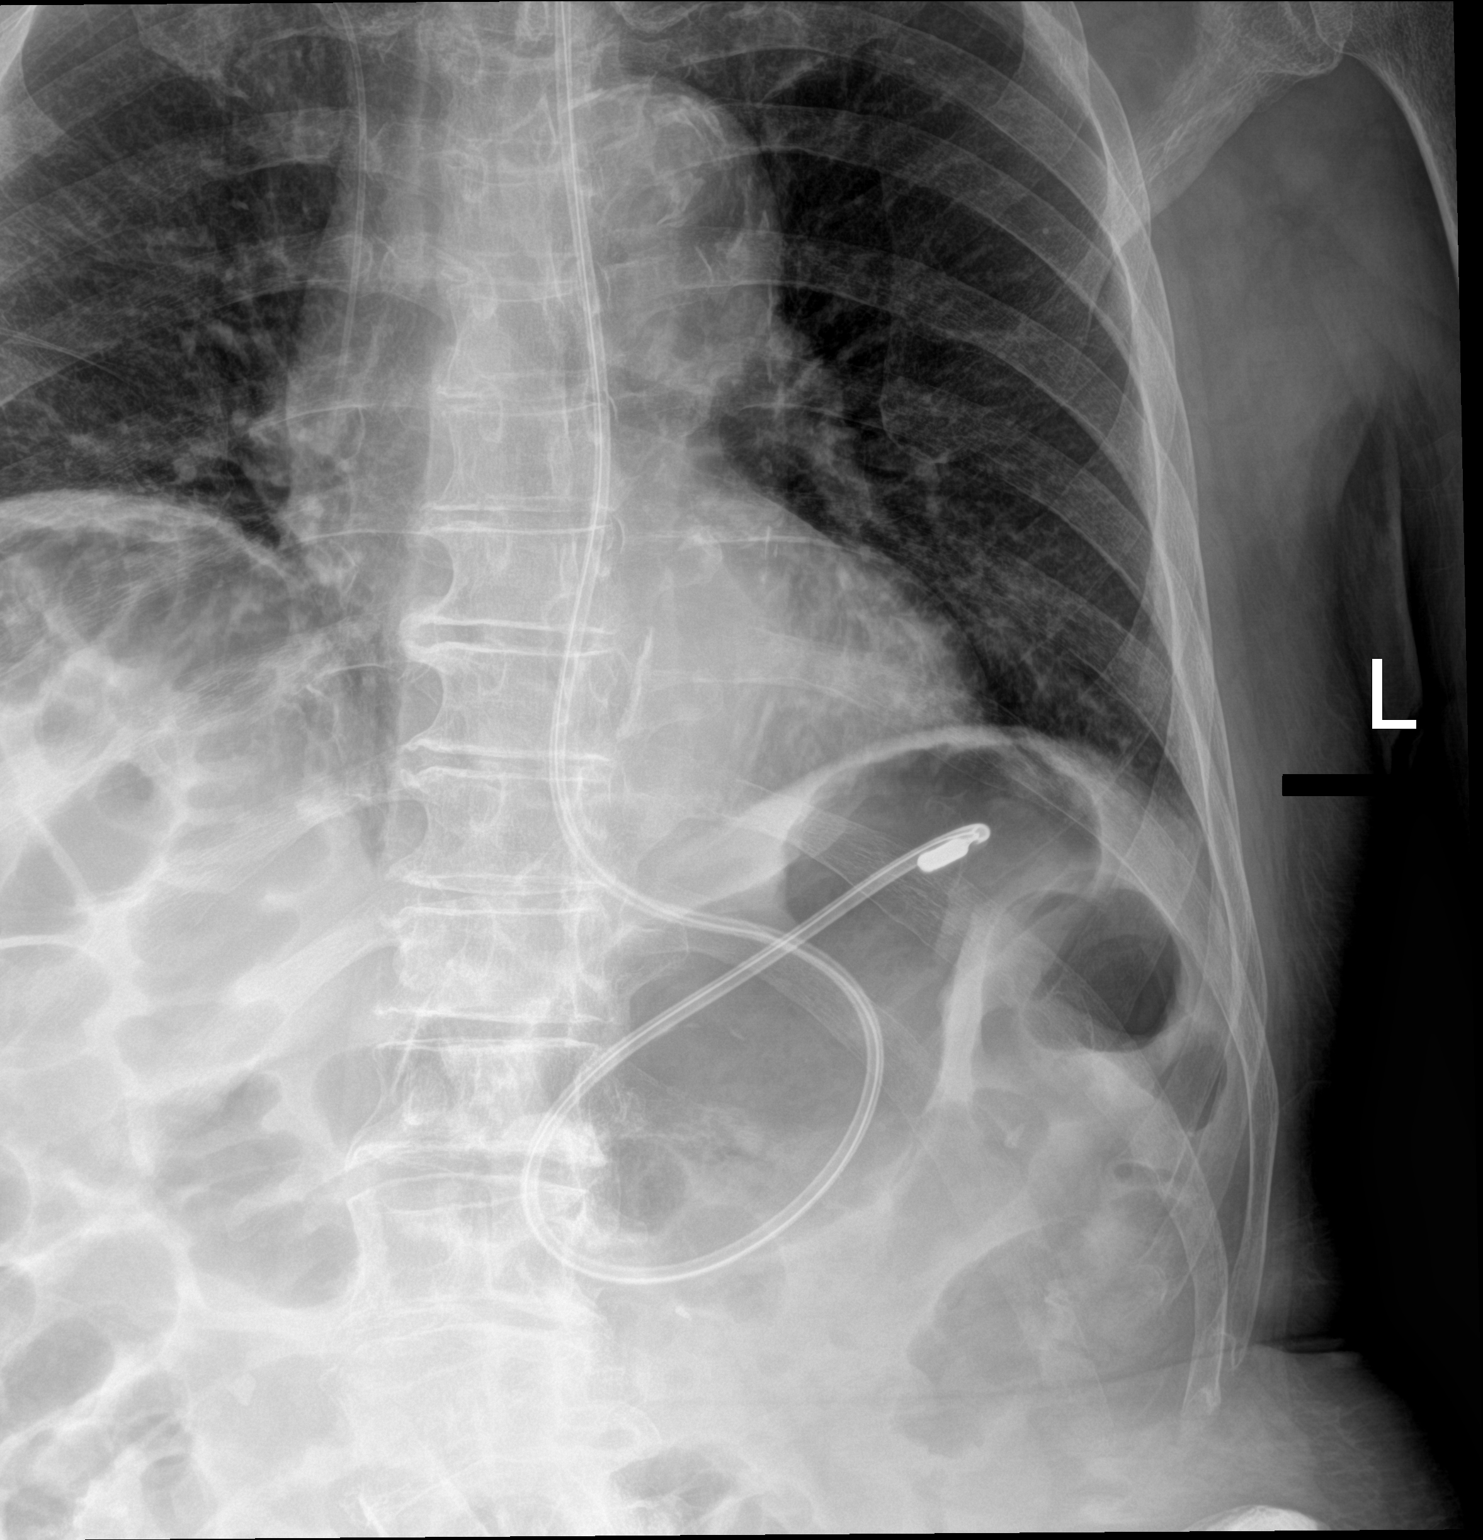

[1 of 1 positions shown; findings below may reference images not displayed]

FINDINGS: Feeding tube coils in the stomach with the tip in the fundus.
Nonobstructive bowel gas pattern. Visualized lungs clear.
IMPRESSION: Feeding tube coils in the stomach with the tip in the fundus.

## 2020-11-29 IMAGING — CT CT ABD-PELV W/ CM
2 of 5 series · 10 of 46 positions shown, 11 images · IV contrast (omnipaque)
Comparison: Chest CT 02/24/2019 and abdominopelvic CT 02/22/2019
COMPARISON: Chest CT 02/24/2019 and abdominopelvic CT 02/22/2019

Addendum:
CLINICAL DATA: Hypoxemia. Recent B2DNJ-MB positive. History of
non-Hodgkin's lymphoma.

EXAM:
CT ANGIOGRAPHY CHEST
CT ABDOMEN AND PELVIS WITH CONTRAST
TECHNIQUE: Multidetector CT imaging of the chest was performed using the
standard protocol during bolus administration of intravenous
contrast. Multiplanar CT image reconstructions and MIPs were
obtained to evaluate the vascular anatomy. Multidetector CT imaging
of the abdomen and pelvis was performed using the standard protocol
during bolus administration of intravenous contrast.
CONTRAST:  100mL OMNIPAQUE IOHEXOL 350 MG/ML SOLN

[Series 11: axial st · axial · 0.83mm/px · z∈[-657,-182]mm · 7 of 111 slices shown, 8 images]
[im 8/111  soft-tissue]
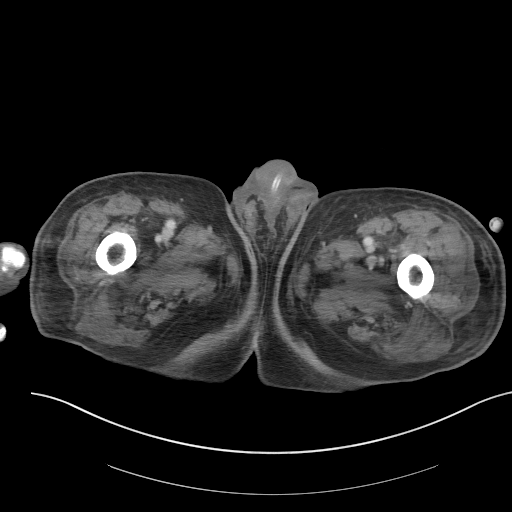
[im 8/111  bone]
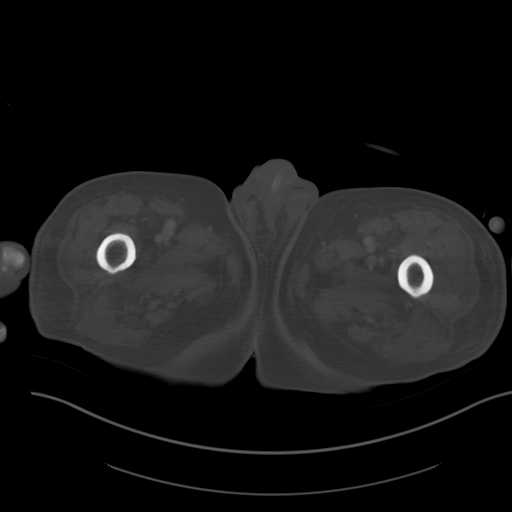
[im 24/111  soft-tissue]
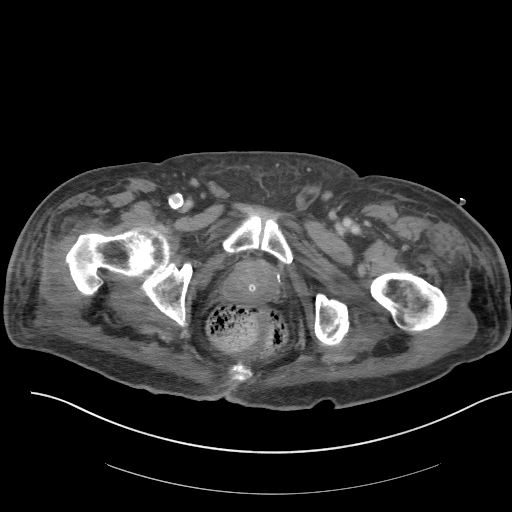
[im 40/111  soft-tissue]
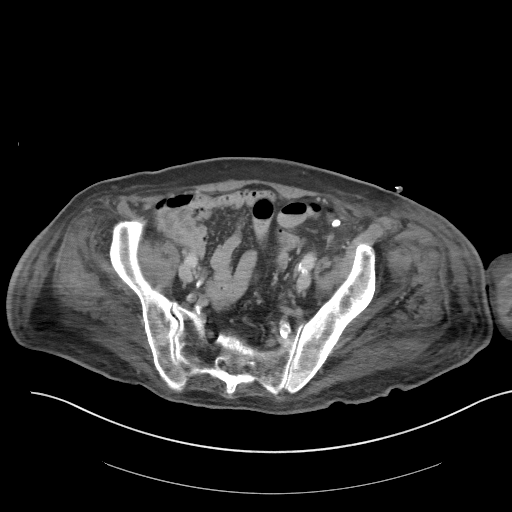
[im 56/111  soft-tissue]
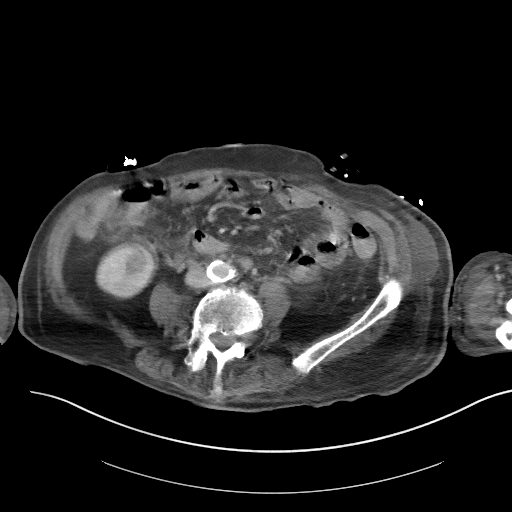
[im 71/111  soft-tissue]
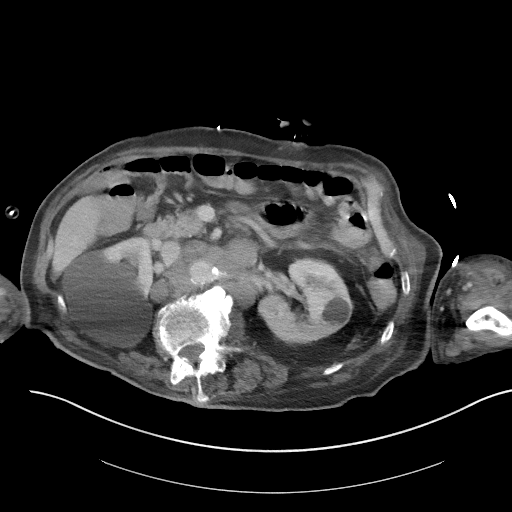
[im 87/111  soft-tissue]
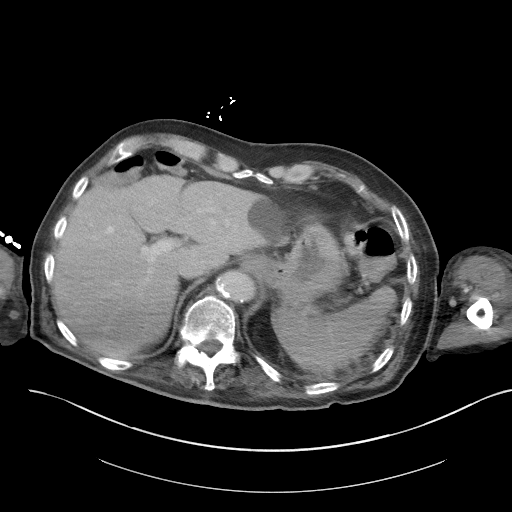
[im 103/111  soft-tissue]
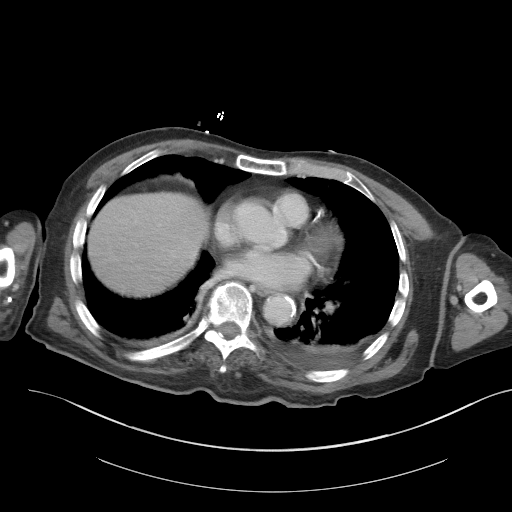

[Series 13: coronal st · coronal · 0.82mm/px · 3 of 78 slices shown]
[im 26/78  soft-tissue]
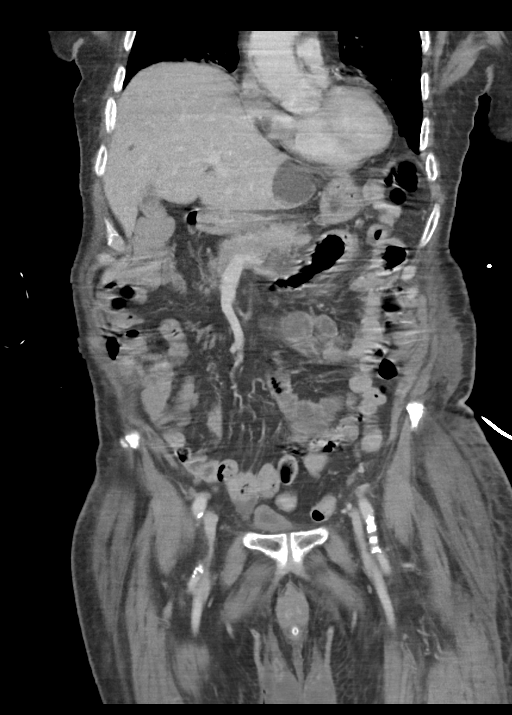
[im 35/78  soft-tissue]
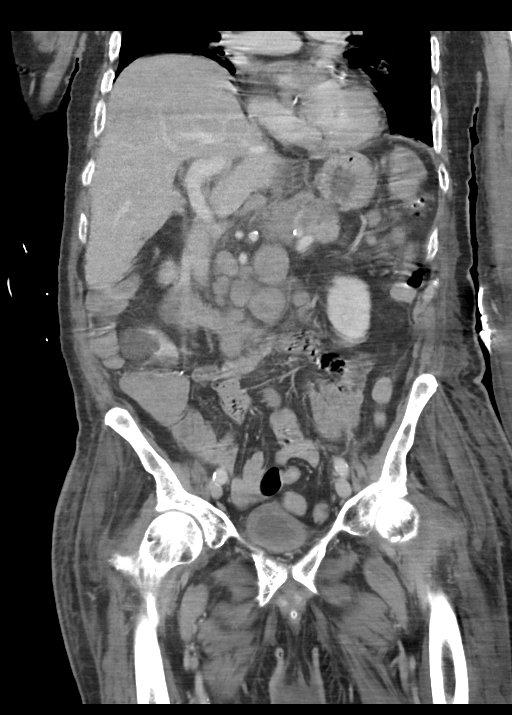
[im 43/78  soft-tissue]
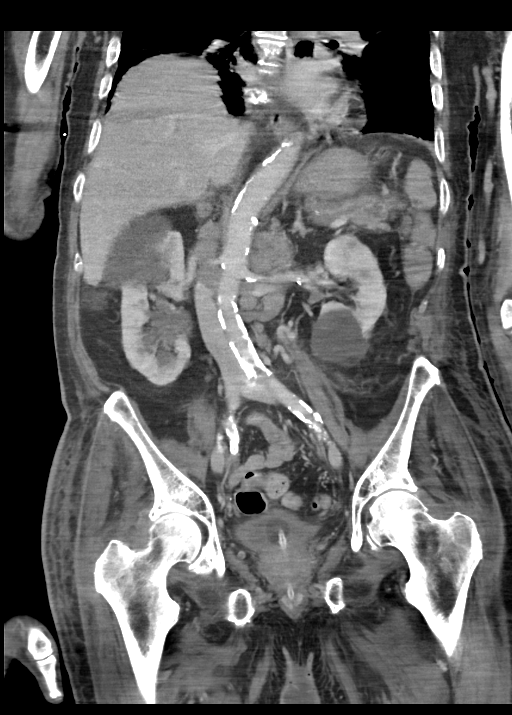

[10 of 46 positions shown; findings below may reference images not displayed]

FINDINGS: CTA CHEST FINDINGS

Cardiovascular: Heart is normal size. Mild calcified plaque over the
left main in addition moderate calcified plaque over the left
anterior descending and lateral circumflex coronary arteries.
Mild-to-moderate calcified plaque over the thoracic aorta. Thoracic
aorta is normal caliber. Pulmonary arterial system demonstrates no
definite emboli. Somewhat poor opacification over the right lower
lobar arteries but no definite emboli. Remaining vascular structures
are unremarkable.

Mediastinum/Nodes: No evidence of mediastinal adenopathy. No left
hilar adenopathy. Stable 1.5 cm right hilar lymph node. Slight
worsening adenopathy over the left neck base with 3 adjacent
enlarged lymph nodes with the largest measuring 2 cm by short axis
compatible with known non-Hodgkin's lymphoma.

Lungs/Pleura: Lungs are adequately inflated with interval
improvement in patient's previously seen bilateral patchy airspace
process with minimal residual peripheral airspace density over the
right upper lobe. Slight worsening of a small left pleural effusion
with stable tiny amount right pleural fluid and associated mild
bibasilar atelectasis. Airways are normal.

Musculoskeletal: Degenerative change of the spine.

Review of the MIP images confirms the above findings.

CT ABDOMEN and PELVIS FINDINGS

Hepatobiliary: Several well-defined liver hypodensities unchanged
with the largest over the lateral segment of the left lobe measuring
4 cm compatible with cysts. Gallbladder is contracted. Biliary tree
is normal.

Pancreas: Pancreas is normal.

Spleen: Decrease in size of a 2.2 cm hypodensity over the inferior
spleen.

Adrenals/Urinary Tract: Adrenal glands are normal. Kidneys are
normal in size. Mild prominence of the right intrarenal collecting
system and ureter. No left-sided hydronephrosis. Numerous renal
cysts unchanged. Left ureter is normal. Punctate stone lower pole
left kidney. Foley catheter is present within a decompressed
bladder.

Stomach/Bowel: Percutaneous gastrostomy tube is in adequate
position. Stomach is otherwise unremarkable. Small bowel is within
normal. Appendix is not visualized. Moderate fecal retention over
the rectum as the colon is otherwise unremarkable.

Vascular/Lymphatic: Moderate calcified plaque over the abdominal
aorta which is normal in caliber. Calcified plaque over the iliac
arteries.

There is interval worsening of extensive adenopathy over the
retroperitoneum/periaortic region worsening adenopathy in the region
of the celiac axis and moderate worsening adenopathy posterior to
the pancreas in over the mesentery in the left upper quadrant. These
findings are compatible with progression of patient's known
non-Hodgkin's lymphoma.

Reproductive: Normal.

Other: No significant free peritoneal fluid or focal inflammatory
change. Mild diffuse subcutaneous edema.

Musculoskeletal: Degenerative change of the spine and hips.

Review of the MIP images confirms the above findings.
IMPRESSION: 1.  No evidence of pulmonary embolism.

2. Interval improvement in the previously noted bilateral multifocal
pneumonia with mild residual peripheral airspace density over the
right upper lobe. Stable tiny amount right pleural fluid with new
small left pleural effusion with associated bibasilar atelectasis.

3. Worsening adenopathy over the left neck base and
abdomen/retroperitoneum. Stable right hilar adenopathy. Slight
decrease in size of a 2.2 cm hypodense splenic mass. Findings are
compatible with patient's known non-Hodgkin's lymphoma.

4. Numerous bilateral renal cysts as well as multiple liver cysts
unchanged. Punctate nonobstructing left renal stone. Mild prominence
of the right intrarenal collecting system.

5. Subtle stable lucent lesion over the right iliac crest which may
be benign versus metastatic disease. Recommend attention on
follow-up.

6. Aortic Atherosclerosis (MU2C6-KTQ.Q). Atherosclerotic coronary
artery disease.

ADDENDUM:
Note that patient's right-sided PICC line has been pulled back since
the earlier chest x-ray today as tip is still over the SVC just
below the level of the head of the right clavicle.

Further clinical information was obtained that patient has a sacral
decubitus ulcer. Further review of the images suggest a mild midline
soft tissues sacral serration. There is only minimal stranding of
the underlying subcutaneous fat and no evidence of underlying
subcutaneous abscess. No focal bone destruction is seen within the
adjacent sacrum/coccyx. There is slight decreased density of the
coccygeal tip compared to the remainder of the adjacent
sacrum/coccyx. Although unlikely, osteomyelitis is possible at this
level. MRI may be helpful if clinically indicated.

These findings were discussed with Dr. Kitzinger.

*** End of Addendum ***
FINDINGS: CTA CHEST FINDINGS

Cardiovascular: Heart is normal size. Mild calcified plaque over the
left main in addition moderate calcified plaque over the left
anterior descending and lateral circumflex coronary arteries.
Mild-to-moderate calcified plaque over the thoracic aorta. Thoracic
aorta is normal caliber. Pulmonary arterial system demonstrates no
definite emboli. Somewhat poor opacification over the right lower
lobar arteries but no definite emboli. Remaining vascular structures
are unremarkable.

Mediastinum/Nodes: No evidence of mediastinal adenopathy. No left
hilar adenopathy. Stable 1.5 cm right hilar lymph node. Slight
worsening adenopathy over the left neck base with 3 adjacent
enlarged lymph nodes with the largest measuring 2 cm by short axis
compatible with known non-Hodgkin's lymphoma.

Lungs/Pleura: Lungs are adequately inflated with interval
improvement in patient's previously seen bilateral patchy airspace
process with minimal residual peripheral airspace density over the
right upper lobe. Slight worsening of a small left pleural effusion
with stable tiny amount right pleural fluid and associated mild
bibasilar atelectasis. Airways are normal.

Musculoskeletal: Degenerative change of the spine.

Review of the MIP images confirms the above findings.

CT ABDOMEN and PELVIS FINDINGS

Hepatobiliary: Several well-defined liver hypodensities unchanged
with the largest over the lateral segment of the left lobe measuring
4 cm compatible with cysts. Gallbladder is contracted. Biliary tree
is normal.

Pancreas: Pancreas is normal.

Spleen: Decrease in size of a 2.2 cm hypodensity over the inferior
spleen.

Adrenals/Urinary Tract: Adrenal glands are normal. Kidneys are
normal in size. Mild prominence of the right intrarenal collecting
system and ureter. No left-sided hydronephrosis. Numerous renal
cysts unchanged. Left ureter is normal. Punctate stone lower pole
left kidney. Foley catheter is present within a decompressed
bladder.

Stomach/Bowel: Percutaneous gastrostomy tube is in adequate
position. Stomach is otherwise unremarkable. Small bowel is within
normal. Appendix is not visualized. Moderate fecal retention over
the rectum as the colon is otherwise unremarkable.

Vascular/Lymphatic: Moderate calcified plaque over the abdominal
aorta which is normal in caliber. Calcified plaque over the iliac
arteries.

There is interval worsening of extensive adenopathy over the
retroperitoneum/periaortic region worsening adenopathy in the region
of the celiac axis and moderate worsening adenopathy posterior to
the pancreas in over the mesentery in the left upper quadrant. These
findings are compatible with progression of patient's known
non-Hodgkin's lymphoma.

Reproductive: Normal.

Other: No significant free peritoneal fluid or focal inflammatory
change. Mild diffuse subcutaneous edema.

Musculoskeletal: Degenerative change of the spine and hips.

Review of the MIP images confirms the above findings.
IMPRESSION: 1.  No evidence of pulmonary embolism.

2. Interval improvement in the previously noted bilateral multifocal
pneumonia with mild residual peripheral airspace density over the
right upper lobe. Stable tiny amount right pleural fluid with new
small left pleural effusion with associated bibasilar atelectasis.

3. Worsening adenopathy over the left neck base and
abdomen/retroperitoneum. Stable right hilar adenopathy. Slight
decrease in size of a 2.2 cm hypodense splenic mass. Findings are
compatible with patient's known non-Hodgkin's lymphoma.

4. Numerous bilateral renal cysts as well as multiple liver cysts
unchanged. Punctate nonobstructing left renal stone. Mild prominence
of the right intrarenal collecting system.

5. Subtle stable lucent lesion over the right iliac crest which may
be benign versus metastatic disease. Recommend attention on
follow-up.

6. Aortic Atherosclerosis (MU2C6-KTQ.Q). Atherosclerotic coronary
artery disease.

## 2020-11-29 IMAGING — CT CT ANGIO CHEST
2 of 6 series · 12 of 46 positions shown · IV contrast (APPLIED)
Comparison: Chest CT 02/24/2019 and abdominopelvic CT 02/22/2019
COMPARISON: Chest CT 02/24/2019 and abdominopelvic CT 02/22/2019

Addendum:
CLINICAL DATA: Hypoxemia. Recent B2DNJ-MB positive. History of
non-Hodgkin's lymphoma.

EXAM:
CT ANGIOGRAPHY CHEST
CT ABDOMEN AND PELVIS WITH CONTRAST
TECHNIQUE: Multidetector CT imaging of the chest was performed using the
standard protocol during bolus administration of intravenous
contrast. Multiplanar CT image reconstructions and MIPs were
obtained to evaluate the vascular anatomy. Multidetector CT imaging
of the abdomen and pelvis was performed using the standard protocol
during bolus administration of intravenous contrast.
CONTRAST:  100mL OMNIPAQUE IOHEXOL 350 MG/ML SOLN

[Series 505: thins · axial · 0.87mm/px · z∈[-286,-27]mm · 9 of 315 slices shown]
[im 28/315  lung]
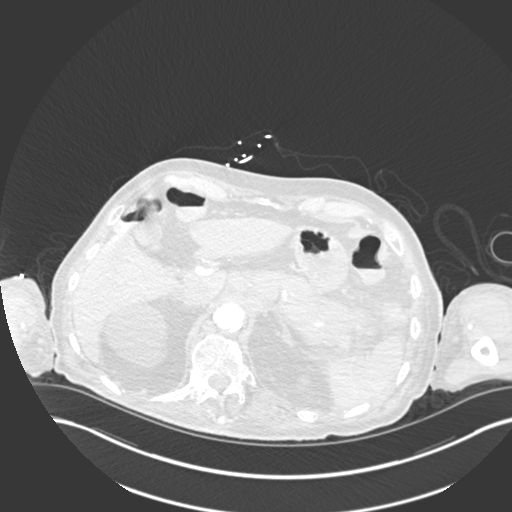
[im 55/315  soft-tissue]
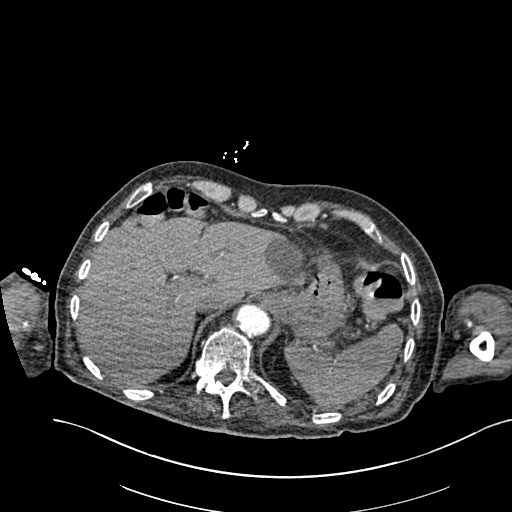
[im 96/315  lung]
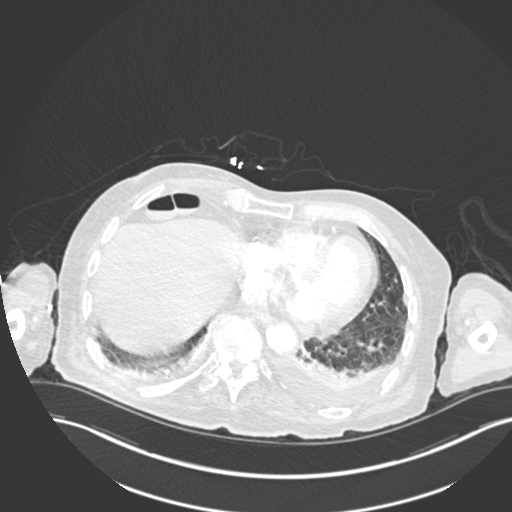
[im 123/315  soft-tissue]
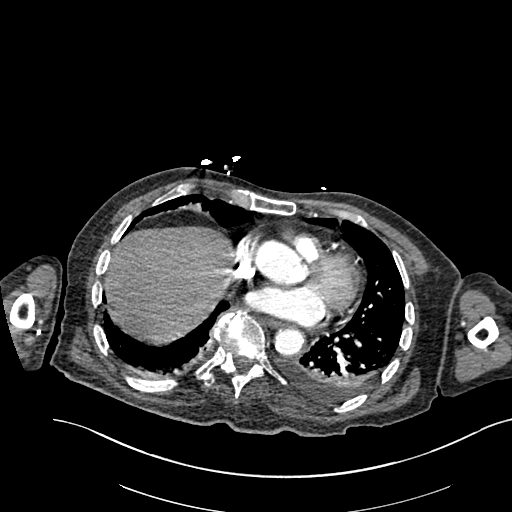
[im 164/315  lung]
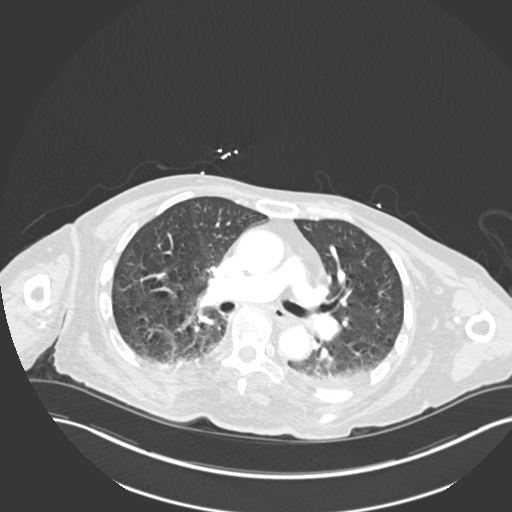
[im 192/315  soft-tissue]
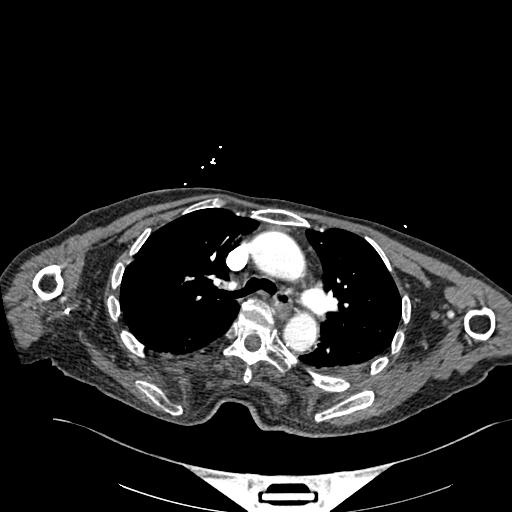
[im 219/315  lung]
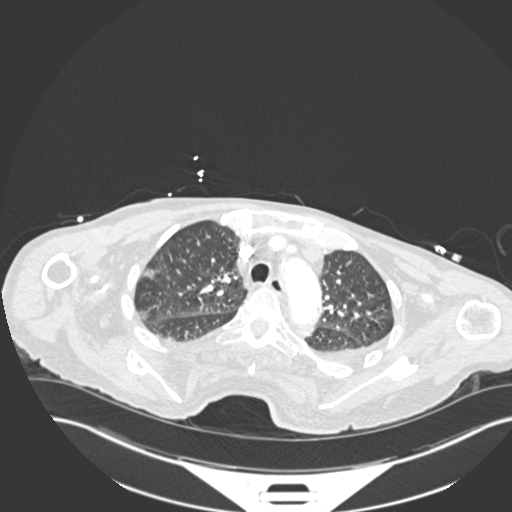
[im 260/315  soft-tissue]
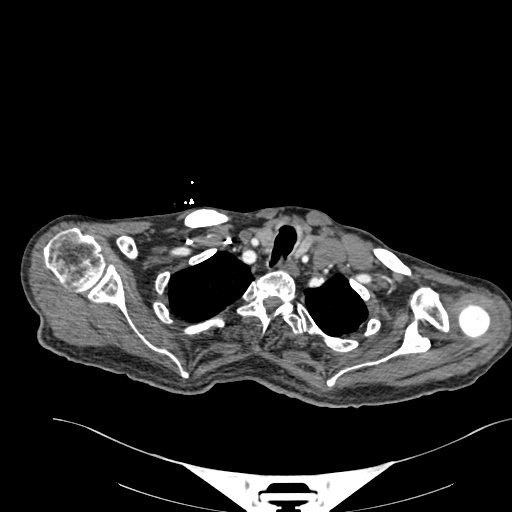
[im 287/315  lung]
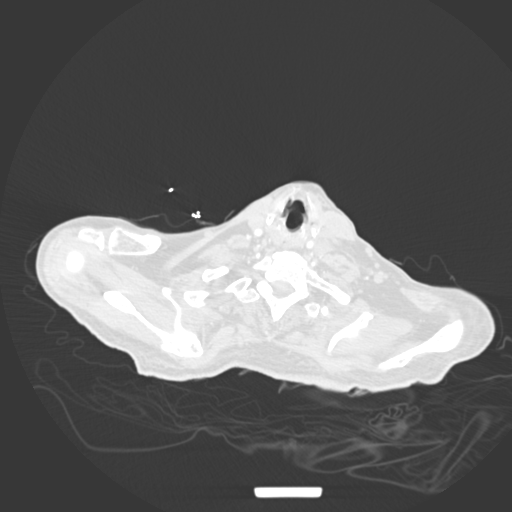

[Series 506: coronal mpr · coronal · 0.68mm/px · 3 of 111 slices shown]
[im 28/111  soft-tissue]
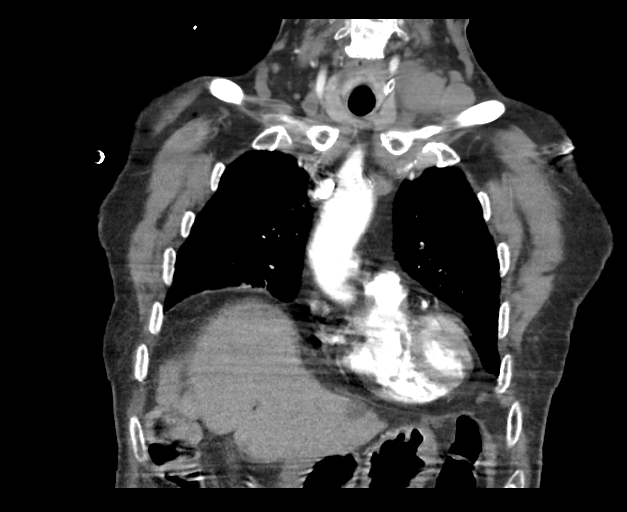
[im 56/111  soft-tissue]
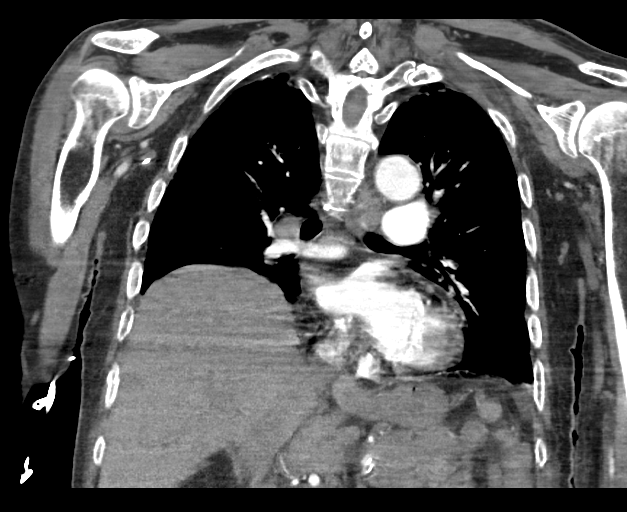
[im 83/111  soft-tissue]
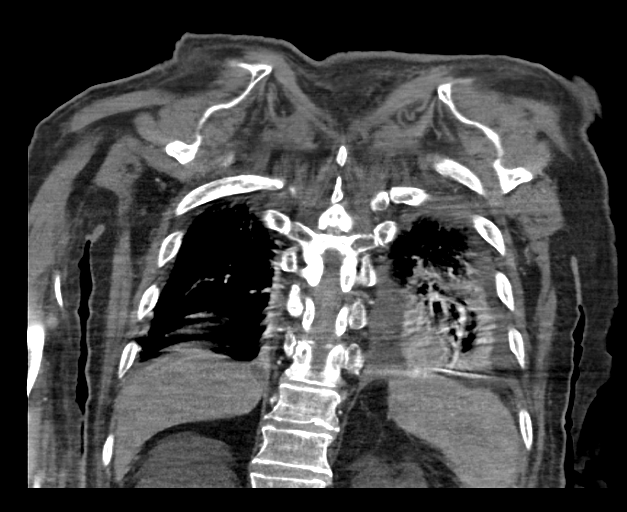

[12 of 46 positions shown; findings below may reference images not displayed]

FINDINGS: CTA CHEST FINDINGS

Cardiovascular: Heart is normal size. Mild calcified plaque over the
left main in addition moderate calcified plaque over the left
anterior descending and lateral circumflex coronary arteries.
Mild-to-moderate calcified plaque over the thoracic aorta. Thoracic
aorta is normal caliber. Pulmonary arterial system demonstrates no
definite emboli. Somewhat poor opacification over the right lower
lobar arteries but no definite emboli. Remaining vascular structures
are unremarkable.

Mediastinum/Nodes: No evidence of mediastinal adenopathy. No left
hilar adenopathy. Stable 1.5 cm right hilar lymph node. Slight
worsening adenopathy over the left neck base with 3 adjacent
enlarged lymph nodes with the largest measuring 2 cm by short axis
compatible with known non-Hodgkin's lymphoma.

Lungs/Pleura: Lungs are adequately inflated with interval
improvement in patient's previously seen bilateral patchy airspace
process with minimal residual peripheral airspace density over the
right upper lobe. Slight worsening of a small left pleural effusion
with stable tiny amount right pleural fluid and associated mild
bibasilar atelectasis. Airways are normal.

Musculoskeletal: Degenerative change of the spine.

Review of the MIP images confirms the above findings.

CT ABDOMEN and PELVIS FINDINGS

Hepatobiliary: Several well-defined liver hypodensities unchanged
with the largest over the lateral segment of the left lobe measuring
4 cm compatible with cysts. Gallbladder is contracted. Biliary tree
is normal.

Pancreas: Pancreas is normal.

Spleen: Decrease in size of a 2.2 cm hypodensity over the inferior
spleen.

Adrenals/Urinary Tract: Adrenal glands are normal. Kidneys are
normal in size. Mild prominence of the right intrarenal collecting
system and ureter. No left-sided hydronephrosis. Numerous renal
cysts unchanged. Left ureter is normal. Punctate stone lower pole
left kidney. Foley catheter is present within a decompressed
bladder.

Stomach/Bowel: Percutaneous gastrostomy tube is in adequate
position. Stomach is otherwise unremarkable. Small bowel is within
normal. Appendix is not visualized. Moderate fecal retention over
the rectum as the colon is otherwise unremarkable.

Vascular/Lymphatic: Moderate calcified plaque over the abdominal
aorta which is normal in caliber. Calcified plaque over the iliac
arteries.

There is interval worsening of extensive adenopathy over the
retroperitoneum/periaortic region worsening adenopathy in the region
of the celiac axis and moderate worsening adenopathy posterior to
the pancreas in over the mesentery in the left upper quadrant. These
findings are compatible with progression of patient's known
non-Hodgkin's lymphoma.

Reproductive: Normal.

Other: No significant free peritoneal fluid or focal inflammatory
change. Mild diffuse subcutaneous edema.

Musculoskeletal: Degenerative change of the spine and hips.

Review of the MIP images confirms the above findings.
IMPRESSION: 1.  No evidence of pulmonary embolism.

2. Interval improvement in the previously noted bilateral multifocal
pneumonia with mild residual peripheral airspace density over the
right upper lobe. Stable tiny amount right pleural fluid with new
small left pleural effusion with associated bibasilar atelectasis.

3. Worsening adenopathy over the left neck base and
abdomen/retroperitoneum. Stable right hilar adenopathy. Slight
decrease in size of a 2.2 cm hypodense splenic mass. Findings are
compatible with patient's known non-Hodgkin's lymphoma.

4. Numerous bilateral renal cysts as well as multiple liver cysts
unchanged. Punctate nonobstructing left renal stone. Mild prominence
of the right intrarenal collecting system.

5. Subtle stable lucent lesion over the right iliac crest which may
be benign versus metastatic disease. Recommend attention on
follow-up.

6. Aortic Atherosclerosis ([id number]-KTQ.Q). Atherosclerotic coronary
artery disease.

ADDENDUM:
Note that patient's right-sided PICC line has been pulled back since
the earlier chest x-ray today as tip is still over the SVC just
below the level of the head of the right clavicle.

Further clinical information was obtained that patient has a sacral
decubitus ulcer. Further review of the images suggest a mild midline
soft tissues sacral serration. There is only minimal stranding of
the underlying subcutaneous fat and no evidence of underlying
subcutaneous abscess. No focal bone destruction is seen within the
adjacent sacrum/coccyx. There is slight decreased density of the
coccygeal tip compared to the remainder of the adjacent
sacrum/coccyx. Although unlikely, osteomyelitis is possible at this
level. MRI may be helpful if clinically indicated.

These findings were discussed with Dr. Kitzinger.

*** End of Addendum ***
FINDINGS: CTA CHEST FINDINGS

Cardiovascular: Heart is normal size. Mild calcified plaque over the
left main in addition moderate calcified plaque over the left
anterior descending and lateral circumflex coronary arteries.
Mild-to-moderate calcified plaque over the thoracic aorta. Thoracic
aorta is normal caliber. Pulmonary arterial system demonstrates no
definite emboli. Somewhat poor opacification over the right lower
lobar arteries but no definite emboli. Remaining vascular structures
are unremarkable.

Mediastinum/Nodes: No evidence of mediastinal adenopathy. No left
hilar adenopathy. Stable 1.5 cm right hilar lymph node. Slight
worsening adenopathy over the left neck base with 3 adjacent
enlarged lymph nodes with the largest measuring 2 cm by short axis
compatible with known non-Hodgkin's lymphoma.

Lungs/Pleura: Lungs are adequately inflated with interval
improvement in patient's previously seen bilateral patchy airspace
process with minimal residual peripheral airspace density over the
right upper lobe. Slight worsening of a small left pleural effusion
with stable tiny amount right pleural fluid and associated mild
bibasilar atelectasis. Airways are normal.

Musculoskeletal: Degenerative change of the spine.

Review of the MIP images confirms the above findings.

CT ABDOMEN and PELVIS FINDINGS

Hepatobiliary: Several well-defined liver hypodensities unchanged
with the largest over the lateral segment of the left lobe measuring
4 cm compatible with cysts. Gallbladder is contracted. Biliary tree
is normal.

Pancreas: Pancreas is normal.

Spleen: Decrease in size of a 2.2 cm hypodensity over the inferior
spleen.

Adrenals/Urinary Tract: Adrenal glands are normal. Kidneys are
normal in size. Mild prominence of the right intrarenal collecting
system and ureter. No left-sided hydronephrosis. Numerous renal
cysts unchanged. Left ureter is normal. Punctate stone lower pole
left kidney. Foley catheter is present within a decompressed
bladder.

Stomach/Bowel: Percutaneous gastrostomy tube is in adequate
position. Stomach is otherwise unremarkable. Small bowel is within
normal. Appendix is not visualized. Moderate fecal retention over
the rectum as the colon is otherwise unremarkable.

Vascular/Lymphatic: Moderate calcified plaque over the abdominal
aorta which is normal in caliber. Calcified plaque over the iliac
arteries.

There is interval worsening of extensive adenopathy over the
retroperitoneum/periaortic region worsening adenopathy in the region
of the celiac axis and moderate worsening adenopathy posterior to
the pancreas in over the mesentery in the left upper quadrant. These
findings are compatible with progression of patient's known
non-Hodgkin's lymphoma.

Reproductive: Normal.

Other: No significant free peritoneal fluid or focal inflammatory
change. Mild diffuse subcutaneous edema.

Musculoskeletal: Degenerative change of the spine and hips.

Review of the MIP images confirms the above findings.
IMPRESSION: 1.  No evidence of pulmonary embolism.

2. Interval improvement in the previously noted bilateral multifocal
pneumonia with mild residual peripheral airspace density over the
right upper lobe. Stable tiny amount right pleural fluid with new
small left pleural effusion with associated bibasilar atelectasis.

3. Worsening adenopathy over the left neck base and
abdomen/retroperitoneum. Stable right hilar adenopathy. Slight
decrease in size of a 2.2 cm hypodense splenic mass. Findings are
compatible with patient's known non-Hodgkin's lymphoma.

4. Numerous bilateral renal cysts as well as multiple liver cysts
unchanged. Punctate nonobstructing left renal stone. Mild prominence
of the right intrarenal collecting system.

5. Subtle stable lucent lesion over the right iliac crest which may
be benign versus metastatic disease. Recommend attention on
follow-up.

6. Aortic Atherosclerosis ([id number]-KTQ.Q). Atherosclerotic coronary
artery disease.
